# Patient Record
Sex: Male | Born: 1945
Health system: Southern US, Community
[De-identification: ages and names within clinical notes are randomized; demographics above are authoritative.]

## PROBLEM LIST (undated history)

## (undated) DIAGNOSIS — G4730 Sleep apnea, unspecified: Secondary | ICD-10-CM

## (undated) DIAGNOSIS — I739 Peripheral vascular disease, unspecified: Secondary | ICD-10-CM

## (undated) DIAGNOSIS — I701 Atherosclerosis of renal artery: Secondary | ICD-10-CM

## (undated) DIAGNOSIS — J189 Pneumonia, unspecified organism: Secondary | ICD-10-CM

## (undated) DIAGNOSIS — Z95820 Peripheral vascular angioplasty status with implants and grafts: Secondary | ICD-10-CM

## (undated) DIAGNOSIS — I5189 Other ill-defined heart diseases: Secondary | ICD-10-CM

## (undated) DIAGNOSIS — I214 Non-ST elevation (NSTEMI) myocardial infarction: Secondary | ICD-10-CM

## (undated) DIAGNOSIS — I10 Essential (primary) hypertension: Secondary | ICD-10-CM

## (undated) DIAGNOSIS — I2510 Atherosclerotic heart disease of native coronary artery without angina pectoris: Secondary | ICD-10-CM

## (undated) DIAGNOSIS — K449 Diaphragmatic hernia without obstruction or gangrene: Secondary | ICD-10-CM

## (undated) DIAGNOSIS — E785 Hyperlipidemia, unspecified: Secondary | ICD-10-CM

## (undated) DIAGNOSIS — I519 Heart disease, unspecified: Secondary | ICD-10-CM

## (undated) HISTORY — PX: HIATAL HERNIA REPAIR: SHX195

## (undated) HISTORY — DX: Diaphragmatic hernia without obstruction or gangrene: K44.9

## (undated) HISTORY — DX: Atherosclerotic heart disease of native coronary artery without angina pectoris: I25.10

## (undated) HISTORY — DX: Hyperlipidemia, unspecified: E78.5

## (undated) HISTORY — PX: OTHER SURGICAL HISTORY: SHX169

## (undated) HISTORY — PX: CATARACT EXTRACTION: SUR2

## (undated) HISTORY — DX: Peripheral vascular disease, unspecified: I73.9

## (undated) HISTORY — DX: Atherosclerosis of renal artery: I70.1

## (undated) HISTORY — DX: Sleep apnea, unspecified: G47.30

## (undated) HISTORY — PX: SOFT TISSUE TUMOR RESECTION: SHX1054

## (undated) HISTORY — PX: SHOULDER ARTHROSCOPY: SHX128

---

## 1999-02-28 ENCOUNTER — Inpatient Hospital Stay (HOSPITAL_COMMUNITY): Admission: EM | Admit: 1999-02-28 | Discharge: 1999-03-02 | Payer: Self-pay | Admitting: Emergency Medicine

## 1999-02-28 ENCOUNTER — Encounter: Payer: Self-pay | Admitting: Internal Medicine

## 1999-09-27 ENCOUNTER — Encounter: Payer: Self-pay | Admitting: Emergency Medicine

## 1999-09-27 ENCOUNTER — Emergency Department (HOSPITAL_COMMUNITY): Admission: EM | Admit: 1999-09-27 | Discharge: 1999-09-27 | Payer: Self-pay

## 1999-09-28 ENCOUNTER — Encounter: Payer: Self-pay | Admitting: Urology

## 1999-09-28 ENCOUNTER — Ambulatory Visit (HOSPITAL_COMMUNITY): Admission: RE | Admit: 1999-09-28 | Discharge: 1999-09-28 | Payer: Self-pay | Admitting: Urology

## 2002-12-15 ENCOUNTER — Emergency Department (HOSPITAL_COMMUNITY): Admission: EM | Admit: 2002-12-15 | Discharge: 2002-12-15 | Payer: Self-pay | Admitting: Emergency Medicine

## 2002-12-15 ENCOUNTER — Encounter: Payer: Self-pay | Admitting: Emergency Medicine

## 2003-06-10 ENCOUNTER — Ambulatory Visit (HOSPITAL_COMMUNITY): Admission: RE | Admit: 2003-06-10 | Discharge: 2003-06-10 | Payer: Self-pay | Admitting: Unknown Physician Specialty

## 2004-08-04 ENCOUNTER — Ambulatory Visit: Payer: Self-pay | Admitting: Internal Medicine

## 2004-08-04 ENCOUNTER — Inpatient Hospital Stay (HOSPITAL_COMMUNITY): Admission: AD | Admit: 2004-08-04 | Discharge: 2004-08-08 | Payer: Self-pay | Admitting: Internal Medicine

## 2004-08-04 ENCOUNTER — Ambulatory Visit: Payer: Self-pay | Admitting: Cardiology

## 2004-08-17 ENCOUNTER — Ambulatory Visit: Payer: Self-pay | Admitting: Family Medicine

## 2004-08-21 ENCOUNTER — Ambulatory Visit: Payer: Self-pay | Admitting: Internal Medicine

## 2004-08-22 ENCOUNTER — Ambulatory Visit: Payer: Self-pay | Admitting: Family Medicine

## 2004-08-29 ENCOUNTER — Ambulatory Visit: Payer: Self-pay

## 2004-08-29 ENCOUNTER — Encounter: Payer: Self-pay | Admitting: Cardiology

## 2004-09-06 ENCOUNTER — Ambulatory Visit: Payer: Self-pay | Admitting: Cardiology

## 2004-09-12 ENCOUNTER — Ambulatory Visit: Payer: Self-pay | Admitting: Cardiology

## 2004-09-12 ENCOUNTER — Ambulatory Visit: Payer: Self-pay

## 2004-09-12 ENCOUNTER — Encounter: Payer: Self-pay | Admitting: Cardiology

## 2004-09-18 ENCOUNTER — Ambulatory Visit: Payer: Self-pay | Admitting: Internal Medicine

## 2004-09-21 ENCOUNTER — Ambulatory Visit: Payer: Self-pay | Admitting: Cardiology

## 2004-10-05 ENCOUNTER — Ambulatory Visit: Payer: Self-pay | Admitting: Cardiology

## 2004-10-24 ENCOUNTER — Ambulatory Visit: Payer: Self-pay | Admitting: Family Medicine

## 2004-11-09 ENCOUNTER — Ambulatory Visit: Payer: Self-pay | Admitting: Internal Medicine

## 2004-11-24 ENCOUNTER — Ambulatory Visit: Payer: Self-pay | Admitting: Family Medicine

## 2004-11-28 ENCOUNTER — Ambulatory Visit: Payer: Self-pay

## 2004-11-28 ENCOUNTER — Ambulatory Visit: Payer: Self-pay | Admitting: Internal Medicine

## 2004-11-28 ENCOUNTER — Encounter: Payer: Self-pay | Admitting: Cardiology

## 2004-12-04 ENCOUNTER — Ambulatory Visit: Payer: Self-pay | Admitting: Internal Medicine

## 2004-12-07 ENCOUNTER — Ambulatory Visit: Payer: Self-pay | Admitting: Physician Assistant

## 2005-01-04 ENCOUNTER — Ambulatory Visit: Payer: Self-pay | Admitting: Cardiology

## 2005-01-15 ENCOUNTER — Ambulatory Visit: Payer: Self-pay | Admitting: Cardiology

## 2005-01-15 ENCOUNTER — Encounter: Payer: Self-pay | Admitting: Cardiology

## 2005-01-15 ENCOUNTER — Ambulatory Visit: Payer: Self-pay

## 2005-01-17 ENCOUNTER — Ambulatory Visit: Payer: Self-pay | Admitting: Cardiology

## 2005-01-17 ENCOUNTER — Ambulatory Visit (HOSPITAL_COMMUNITY): Admission: RE | Admit: 2005-01-17 | Discharge: 2005-01-17 | Payer: Self-pay | Admitting: Cardiology

## 2005-01-18 ENCOUNTER — Ambulatory Visit: Payer: Self-pay | Admitting: Family Medicine

## 2005-02-05 ENCOUNTER — Ambulatory Visit: Payer: Self-pay | Admitting: Internal Medicine

## 2005-02-23 ENCOUNTER — Ambulatory Visit: Payer: Self-pay | Admitting: Cardiology

## 2005-02-27 ENCOUNTER — Ambulatory Visit: Payer: Self-pay | Admitting: Pulmonary Disease

## 2005-03-04 ENCOUNTER — Ambulatory Visit (HOSPITAL_BASED_OUTPATIENT_CLINIC_OR_DEPARTMENT_OTHER): Admission: RE | Admit: 2005-03-04 | Discharge: 2005-03-04 | Payer: Self-pay | Admitting: Pulmonary Disease

## 2005-03-08 ENCOUNTER — Ambulatory Visit: Payer: Self-pay | Admitting: Cardiology

## 2005-04-26 ENCOUNTER — Ambulatory Visit: Payer: Self-pay | Admitting: Cardiology

## 2005-05-25 ENCOUNTER — Ambulatory Visit: Payer: Self-pay | Admitting: Cardiology

## 2005-07-12 ENCOUNTER — Ambulatory Visit: Payer: Self-pay | Admitting: Family Medicine

## 2005-08-20 ENCOUNTER — Ambulatory Visit: Payer: Self-pay | Admitting: Family Medicine

## 2005-10-17 ENCOUNTER — Ambulatory Visit: Payer: Self-pay | Admitting: Family Medicine

## 2005-12-12 ENCOUNTER — Ambulatory Visit: Payer: Self-pay | Admitting: Family Medicine

## 2006-04-17 ENCOUNTER — Ambulatory Visit: Payer: Self-pay | Admitting: Cardiology

## 2007-03-19 ENCOUNTER — Ambulatory Visit: Payer: Self-pay | Admitting: Cardiology

## 2007-12-10 ENCOUNTER — Ambulatory Visit: Payer: Self-pay | Admitting: Vascular Surgery

## 2010-04-25 DIAGNOSIS — G4730 Sleep apnea, unspecified: Secondary | ICD-10-CM | POA: Insufficient documentation

## 2010-04-25 DIAGNOSIS — I701 Atherosclerosis of renal artery: Secondary | ICD-10-CM

## 2010-04-25 DIAGNOSIS — E782 Mixed hyperlipidemia: Secondary | ICD-10-CM

## 2010-04-25 DIAGNOSIS — E119 Type 2 diabetes mellitus without complications: Secondary | ICD-10-CM | POA: Insufficient documentation

## 2010-04-25 DIAGNOSIS — I2510 Atherosclerotic heart disease of native coronary artery without angina pectoris: Secondary | ICD-10-CM | POA: Insufficient documentation

## 2010-04-26 ENCOUNTER — Ambulatory Visit: Payer: Self-pay | Admitting: Cardiology

## 2010-04-26 DIAGNOSIS — I739 Peripheral vascular disease, unspecified: Secondary | ICD-10-CM

## 2010-05-09 ENCOUNTER — Telehealth (INDEPENDENT_AMBULATORY_CARE_PROVIDER_SITE_OTHER): Payer: Self-pay | Admitting: *Deleted

## 2010-05-10 ENCOUNTER — Ambulatory Visit: Payer: Self-pay

## 2010-05-10 ENCOUNTER — Ambulatory Visit: Payer: Self-pay | Admitting: Internal Medicine

## 2010-05-10 ENCOUNTER — Encounter: Payer: Self-pay | Admitting: Internal Medicine

## 2010-05-10 ENCOUNTER — Encounter: Payer: Self-pay | Admitting: Cardiology

## 2010-05-10 ENCOUNTER — Encounter (HOSPITAL_COMMUNITY)
Admission: RE | Admit: 2010-05-10 | Discharge: 2010-07-18 | Payer: Self-pay | Source: Home / Self Care | Attending: Cardiology | Admitting: Cardiology

## 2010-05-23 ENCOUNTER — Encounter: Payer: Self-pay | Admitting: Cardiology

## 2010-05-24 ENCOUNTER — Ambulatory Visit: Payer: Self-pay | Admitting: Cardiology

## 2010-05-29 ENCOUNTER — Encounter: Payer: Self-pay | Admitting: Cardiology

## 2010-05-30 ENCOUNTER — Encounter: Payer: Self-pay | Admitting: Cardiology

## 2010-05-30 LAB — CONVERTED CEMR LAB
Eosinophils Absolute: 0.1 10*3/uL (ref 0.0–0.7)
Eosinophils Relative: 1 % (ref 0–5)
HCT: 45 % (ref 39.0–52.0)
INR: 0.99 (ref <1.50)
Lymphocytes Relative: 26 % (ref 12–46)
Lymphs Abs: 2.1 10*3/uL (ref 0.7–4.0)
MCV: 83 fL (ref 78.0–100.0)
Monocytes Relative: 6 % (ref 3–12)
Potassium: 4 meq/L (ref 3.5–5.3)
RBC: 5.42 M/uL (ref 4.22–5.81)
Sodium: 138 meq/L (ref 135–145)
WBC: 8 10*3/uL (ref 4.0–10.5)

## 2010-06-01 ENCOUNTER — Ambulatory Visit
Admission: RE | Admit: 2010-06-01 | Payer: Self-pay | Source: Home / Self Care | Attending: Cardiology | Admitting: Cardiology

## 2010-06-05 ENCOUNTER — Encounter: Payer: Self-pay | Admitting: Cardiology

## 2010-06-05 ENCOUNTER — Inpatient Hospital Stay (HOSPITAL_COMMUNITY)
Admission: RE | Admit: 2010-06-05 | Discharge: 2010-06-07 | Payer: Self-pay | Source: Home / Self Care | Attending: Cardiology | Admitting: Cardiology

## 2010-06-05 LAB — CONVERTED CEMR LAB
Cholesterol: 132 mg/dL (ref 0–200)
HDL: 24 mg/dL — ABNORMAL LOW (ref >39)
Triglycerides: 790 mg/dL — ABNORMAL HIGH (ref <150)

## 2010-06-12 ENCOUNTER — Observation Stay (HOSPITAL_COMMUNITY)
Admission: EM | Admit: 2010-06-12 | Discharge: 2010-06-12 | Payer: Self-pay | Source: Home / Self Care | Admitting: Emergency Medicine

## 2010-07-03 ENCOUNTER — Encounter: Payer: Self-pay | Admitting: Cardiology

## 2010-07-05 ENCOUNTER — Ambulatory Visit
Admission: RE | Admit: 2010-07-05 | Discharge: 2010-07-05 | Payer: Self-pay | Source: Home / Self Care | Attending: Cardiology | Admitting: Cardiology

## 2010-07-05 ENCOUNTER — Encounter: Payer: Self-pay | Admitting: Cardiology

## 2010-07-20 NOTE — Miscellaneous (Signed)
  Clinical Lists Changes  Observations: Added new observation of CARDCATHFIND: CONCLUSION:  Successful percutaneous overlapping stents in the distal right coronary artery.   DISPOSITION:  Aggressive risk factor reduction will be recommended. Continuation of clopidogrel will be also suggested, P2Y12 testing will be done.  He has been comfortable on Plavix and if there is good suppression, we will continue this if this is suboptimal changing to Effient may be worthwhile. (06/06/2010 14:57) Added new observation of CARDCATHFIND: CONCLUSION:  Nonobstructive three-vessel coronary artery disease with patent stents in the right coronary artery and LAD.  However, there is a distal tight right coronary artery lesion that may be causing his symptoms.   PLAN:  The patient will be started on Imdur.  He is going to get sublingual nitroglycerin.  He has had a stable pattern of angina, but he has resting discomfort.  I will bring him back for an elective PCI with Dr. Riley Kill.  He understands the need to present to the emergency room should he have any increasing or unstable symptoms.   (06/02/2010 14:57)      Cardiac Cath  Procedure date:  06/06/2010  Findings:      CONCLUSION:  Successful percutaneous overlapping stents in the distal right coronary artery.   DISPOSITION:  Aggressive risk factor reduction will be recommended. Continuation of clopidogrel will be also suggested, P2Y12 testing will be done.  He has been comfortable on Plavix and if there is good suppression, we will continue this if this is suboptimal changing to Effient may be worthwhile.  Cardiac Cath  Procedure date:  06/02/2010  Findings:      CONCLUSION:  Nonobstructive three-vessel coronary artery disease with patent stents in the right coronary artery and LAD.  However, there is a distal tight right coronary artery lesion that may be causing his symptoms.   PLAN:  The patient will be started on Imdur.  He is  going to get sublingual nitroglycerin.  He has had a stable pattern of angina, but he has resting discomfort.  I will bring him back for an elective PCI with Dr. Riley Kill.  He understands the need to present to the emergency room should he have any increasing or unstable symptoms.

## 2010-07-20 NOTE — Miscellaneous (Signed)
  Clinical Lists Changes  Observations: Added new observation of ANKLBRACHIND: Essentially stable bilateral ABI's. Bilateral ABI's are in the mild range. (05/10/2010 9:28) Added new observation of NUCLEAR NOS: Exercise Capacity: Lexiscan with low level exercise Clinical Symptoms: Chest tightness ECG Impression: Markd ST depression Overall Impression: Abnormal stress nuclear study. Overall Impression Comments: There are marked ECG changes with low-level exercise and Lexiscan however perfusion imaging is normal.  (05/10/2010 9:27)      Nuclear Study  Procedure date:  05/10/2010  Findings:      Exercise Capacity: Lexiscan with low level exercise Clinical Symptoms: Chest tightness ECG Impression: Markd ST depression Overall Impression: Abnormal stress nuclear study. Overall Impression Comments: There are marked ECG changes with low-level exercise and Lexiscan however perfusion imaging is normal.   ABI's  Procedure date:  05/10/2010  Findings:      Essentially stable bilateral ABI's. Bilateral ABI's are in the mild range.

## 2010-07-20 NOTE — Assessment & Plan Note (Signed)
Summary: [facility]  Medications Added EFFIENT 10 MG TABS (PRASUGREL HCL) 1 by mouth daily ISOSORBIDE MONONITRATE CR 60 MG XR24H-TAB (ISOSORBIDE MONONITRATE) 1 by mouth daily DEXILANT 60 MG CPDR (DEXLANSOPRAZOLE) daily      Allergies Added:    Visit Type:  Follow-up Primary Provider:  Dr. Ardeen Garland  CC:  CAD.  History of Present Illness: The patient presents for followup of recent PCI of his right coronary artery. At that time he was noted to have a suboptimal platelet response and Plavix. He was sent home on Effient.  He also had Dexilant switched to Prilosec.  Since his discharge he has had none of the chest discomfort that he was having previously. He is able to be active with a greater exercise tolerance. He said no leg pain. He denies any new shortness of breath, PND or orthopnea. He has had no weight gain or edema. His biggest complaint has been continued diarrhea or loose stools. Of note one day when he forgot to take his medications he noted on the following day no diarrhea.  Current Medications (verified): 1)  Fish Oil   Oil (Fish Oil) .Marland Kitchen.. 1 By Mouth Daily 2)  Glipizide 5 Mg Tabs (Glipizide) .Marland Kitchen.. 1 By Mouth Daily 3)  Tricor 145 Mg Tabs (Fenofibrate) .Marland Kitchen.. 1 By Mouth Daily 4)  Metformin Hcl 500 Mg Tabs (Metformin Hcl) .... 2 By Mouth Two Times A Day 5)  Metoprolol Succinate 200 Mg Xr24h-Tab (Metoprolol Succinate) .... 1/2 By Mouth Two Times A Day 6)  Effient 10 Mg Tabs (Prasugrel Hcl) .Marland Kitchen.. 1 By Mouth Daily 7)  Lipitor 80 Mg Tabs (Atorvastatin Calcium) .Marland Kitchen.. 1 By Mouth Daily 8)  Aspirin 81 Mg  Tabs (Aspirin) .Marland Kitchen.. 1 By Mouth Daily 9)  Allopurinol 100 Mg Tabs (Allopurinol) .Marland Kitchen.. 1 By Mouth Daily 10)  Amlodipine Besylate 10 Mg Tabs (Amlodipine Besylate) .Marland Kitchen.. 1 By Mouth Daily 11)  Ramipril 10 Mg Caps (Ramipril) .Marland Kitchen.. 1 By Mouth Daily 12)  Protonix 40 Mg Tbec (Pantoprazole Sodium) .Marland Kitchen.. 1 By Mouth Daily 13)  Isosorbide Mononitrate Cr 60 Mg Xr24h-Tab (Isosorbide Mononitrate)  .Marland Kitchen.. 1 By Mouth Daily  Allergies (verified): 1)  ! Penicillin 2)  ! * Bee Stings  Past History:  Past Medical History: Coronary artery disease (catheterization 2000   revealed nonobstructive disease.  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  12/11 1. Percutaneous stenting    of the distal right coronary artery with drug-eluting stent. Percutaneous stenting    of the distal right coronary proximal posterior descending artery with a drug-eluting stent.) Peripheral vascular disease (He has ABIs of 0.69 on the right and 0.93 on the left.   Vocal cord tumor resected, abdominal  Hyperlipidemia Diabetes mellitus Hiatal hernia  Miild sleep apnea Mild bilateral renal artery stenosis.   Past Surgical History: Hiatal hernia surgery Benign neck tumor  resection Right shoulder arthroscopic surgery Ctaract surgery  Vocal cord polyp removal  Social History: The patient lives in [place] with his wife.  They have  three children.  He works for [employer].  He quit tobacco 7 years ago  Review of Systems       As stated in the HPI and negative for all other systems.   Vital Signs:  Patient profile:   65 year  old male Height:      69 inches Weight:      197 pounds BMI:     29.20 Pulse rate:   102 / minute Resp:     16 per minute BP sitting:   142 / 80  (right arm)  Vitals Entered By: Marrion Coy, CNA (July 05, 2010 1:59 PM)  Physical Exam  General:  Well developed, well nourished, in no acute distress. Head:  normocephalic and atraumatic Eyes:  PERRLA/EOM intact; conjunctiva and lids normal. Mouth:  Oral mucosa normal. Neck:  Neck supple, no JVD. No masses, thyromegaly or abnormal cervical nodes. Chest Wall:  no deformities  Lungs:  Clear bilaterally to auscultation and  percussion. Abdomen:  Bowel sounds positive; abdomen soft and non-tender without masses, organomegaly, or hernias noted. No hepatosplenomegaly. Msk:  Back normal, normal gait. Muscle strength and tone normal. Extremities:  No clubbing or cyanosi,  , mild swelling and erythema of the second right toe improved. Neurologic:  Alert and oriented x 3. Skin:  Intact without lesions or rashes. Cervical Nodes:  no significant adenopathy Inguinal Nodes:  no significant adenopathy Psych:  Normal affect.   Detailed Cardiovascular Exam  Neck    Carotids: Carotids full and equal bilaterally without bruits.      Neck Veins: Normal, no JVD.    Heart    Inspection: no deformities or lifts noted.      Palpation: normal PMI with no thrills palpable.      Auscultation: regular rate and rhythm, S1, S2 without murmurs, rubs, gallops, or clicks.    Vascular    Abdominal Aorta: no palpable masses, pulsations, or audible bruits.      Pedal Pulses: absent right dorsalis pedis pulse, absent right posterior tibial pulse, and absent right dorsalis pedis pulse, absent right posterior tibial pulse, and absent left dorsalis pedis pulse.      Radial Pulses: normal radial pulses bilaterally.      Peripheral Circulation: no clubbing, cyanosis, or edema noted with normal capillary refill.     EKG  Procedure date:  07/05/2010  Findings:      Sinus tachycardia, rate 102, axis within normal limits, intervals within normal limits, inferolateral ST depression  Impression & Recommendations:  Problem # 1:  CAD (ICD-414.00) The patient is now status post DES to his RCA. He is complaining of diarrhea which could be related to Effient.  However, I very much want to continue this drug.  I will try first to switch him back to Dexilant and stop the Prilosec to see if this helps the diarrhea.  If not I might request GI evaluation and stool samples for C Diff.  I do think that his heart rate is increased with the fluid loss and  he is instructed to increase by mouth fluid intake.  His ST changes on EKG seem to be rate related.  Of note his BMET was checked last week. Orders: EKG w/ Interpretation (93000)  Problem # 2:  PVD (ICD-443.9) He will continue with risk reduction.  Problem # 3:  HYPERLIPIDEMIA (ICD-272.4) I will follow up on this on the months to come.  Patient Instructions: 1)  Your physician recommends that you schedule a follow-up appointment in: 1 month with Dr Antoine Poche. 2)  Your physician has recommended you make the following change in your medication:  start Dexilant, stop Protonix Prescriptions: DEXILANT 60 MG CPDR (DEXLANSOPRAZOLE) daily  #90 x 3   Entered by:   Charolotte Capuchin, RN   Authorized by:  Rollene Rotunda, MD, [facility]   Signed by:   Charolotte Capuchin, RN on 07/05/2010   Method used:   Print then Give to Patient   RxID:   [id number] ISOSORBIDE MONONITRATE CR 60 MG XR24H-TAB (ISOSORBIDE MONONITRATE) 1 by mouth daily  #90 x 3   Entered by:   Charolotte Capuchin, RN   Authorized by:   Rollene Rotunda, MD, [facility]   Signed by:   Charolotte Capuchin, RN on 07/05/2010   Method used:   Print then Give to Patient   RxID:   [id number] EFFIENT 10 MG TABS (PRASUGREL HCL) 1 by mouth daily  #90 x 3   Entered by:   Charolotte Capuchin, RN   Authorized by:   Rollene Rotunda, MD, [facility]   Signed by:   Charolotte Capuchin, RN on 07/05/2010   Method used:   Print then Give to Patient   RxID:   [id number]

## 2010-07-20 NOTE — Letter (Signed)
Summary: Cardiac Catheterization Instructions- [facility]  [facility]. [address]   [place], [place] [address]   Phone: [phone]  Fax: [phone]     05/24/2010 MRN: [id number]  [name] [name] [address] [place], [place]  [address]  Dear [name],   You are scheduled for a Cardiac Catheterization on Thursday June 01, 2010 with Dr.  Rollene Rotunda  Please arrive to the 1st floor of the Heart and Vascular Center at [facility] at 8:30 am  on the day of your procedure. Please do not arrive before 6:30 a.m. Call the Heart and Vascular Center at [phone] if you are unable to make your appointmnet. The Code to get into the parking garage under the building is 0003. Take the elevators to the 1st floor. You must have someone to drive you home. Someone must be with you for the first 24 hours after you arrive home. Please wear clothes that are easy to get on and off and wear slip-on shoes. Do not eat or drink after midnight except water with your medications that morning. Bring all your medications and current insurance cards with you.  ___ DO NOT take these medications before your procedure: ________________________________________________________________  ___ Make sure you take your aspirin.  ___ You may take ALL of your medications with water that morning.       The usual length of stay after your procedure is 2 to 3 hours. This can vary.  If you have any questions, please call the office at the number listed above.   Charolotte Capuchin, RN  Appended Document: Cardiac Catheterization Instructions- [facility] pt aware not to take Metformin the day before or 2 days after cath.  Holding Glipizide the am of

## 2010-07-20 NOTE — Assessment & Plan Note (Signed)
Summary: [facility]  Medications Added TRILIPIX 135 MG CPDR (CHOLINE FENOFIBRATE) 1 by mouth daily FISH OIL   OIL (FISH OIL) 1 by mouth daily GLIPIZIDE 5 MG TABS (GLIPIZIDE) 1 by mouth daily TRICOR 145 MG TABS (FENOFIBRATE) 1 by mouth daily DEXILANT 60 MG CPDR (DEXLANSOPRAZOLE) 1 by mouth daily METFORMIN HCL 500 MG TABS (METFORMIN HCL) 2 by mouth two times a day METOPROLOL SUCCINATE 200 MG XR24H-TAB (METOPROLOL SUCCINATE) 1/2 by mouth two times a day PLAVIX 75 MG TABS (CLOPIDOGREL BISULFATE) 1 by mouth daily LIPITOR 80 MG TABS (ATORVASTATIN CALCIUM) 1 by mouth daily ASPIRIN 81 MG  TABS (ASPIRIN) 1 by mouth daily ALLOPURINOL 100 MG TABS (ALLOPURINOL) 1 by mouth daily AMLODIPINE BESYLATE 10 MG TABS (AMLODIPINE BESYLATE) 1 by mouth daily RAMIPRIL 10 MG CAPS (RAMIPRIL) 1 by mouth daily      Allergies Added: ! PENICILLIN ! * BEE STINGS  Visit Type:  Initial Consult Primary Provider:  Dr. Ardeen Garland  CC:  CAD and PVD.  History of Present Illness: The patient presents for followup after having been gone from this clinic for greater than 3 years. He has had coronary disease as described below and peripheral vascular disease. He had conservative management of his PVD by his choice.  He has had some progressive pain with walking since that last visit. This may happen after walking a quarter of a mile. He also has had some redness and swelling in his second toe on the right foot since dropping something on it several weeks ago. There is no open sore but the swelling and redness has not resolved. It is only mildly uncomfortable. He has had some left arm pain which was his previous angina. However, he can rigidity or adenopathy this discomfort. The next day he notices some discomfort. He is not having shortness of breath, PND orthopnea. There is no neck or chest pressure. He has had no palpitations, presyncope or syncope.  Current Medications (verified): 1)  Trilipix 135 Mg Cpdr (Choline  Fenofibrate) .Marland Kitchen.. 1 By Mouth Daily 2)  Fish Oil   Oil (Fish Oil) .Marland Kitchen.. 1 By Mouth Daily 3)  Glipizide 5 Mg Tabs (Glipizide) .Marland Kitchen.. 1 By Mouth Daily 4)  Tricor 145 Mg Tabs (Fenofibrate) .Marland Kitchen.. 1 By Mouth Daily 5)  Dexilant 60 Mg Cpdr (Dexlansoprazole) .Marland Kitchen.. 1 By Mouth Daily 6)  Metformin Hcl 500 Mg Tabs (Metformin Hcl) .... 2 By Mouth Two Times A Day 7)  Metoprolol Succinate 200 Mg Xr24h-Tab (Metoprolol Succinate) .... 1/2 By Mouth Two Times A Day 8)  Plavix 75 Mg Tabs (Clopidogrel Bisulfate) .Marland Kitchen.. 1 By Mouth Daily 9)  Lipitor 80 Mg Tabs (Atorvastatin Calcium) .Marland Kitchen.. 1 By Mouth Daily 10)  Aspirin 81 Mg  Tabs (Aspirin) .Marland Kitchen.. 1 By Mouth Daily 11)  Allopurinol 100 Mg Tabs (Allopurinol) .Marland Kitchen.. 1 By Mouth Daily 12)  Amlodipine Besylate 10 Mg Tabs (Amlodipine Besylate) .Marland Kitchen.. 1 By Mouth Daily 13)  Ramipril 10 Mg Caps (Ramipril) .Marland Kitchen.. 1 By Mouth Daily  Allergies (verified): 1)  ! Penicillin 2)  ! * Bee Stings  Past History:  Past Medical History:  Coronary artery disease (catheterization 2000   revealed nonobstructive disease.  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    He has evidence for common femoral and superficial artery stenosis on the   right and SFA disease on the left), vocal cord tumor resected, abdominal   hernia repaired, hyperlipidemia, diabetes mellitus, hiatal hernia, mild   sleep apnea, mild bilateral renal artery stenosis.   Past Surgical History: Hiatal hernia surgery, benign neck tumor  resection, status post right shoulder arthroscopic surgery, cataract surgery vocal cord polyp removal  Family History: Father with an MI age 9  Social History: The patient lives in [place] with  his wife.  They have  three children.  He works for [employer].  He quit tobacco 7 yearstago  Review of Systems       Positive for reflux, diarrhea  Vital Signs:  Patient profile:   65 year old male Height:      69 inches Weight:      198 pounds BMI:     29.35 Pulse rate:   114 / minute Resp:     18 per minute BP sitting:   132 / 80  (right arm)  Vitals Entered By: Marrion Coy, CNA (April 26, 2010 11:59 AM)  Physical Exam  General:  Well developed, well nourished, in no acute distress. Head:  normocephalic and atraumatic Eyes:  PERRLA/EOM intact; conjunctiva and lids normal. Mouth:  Oral mucosa normal. Neck:  Neck supple, no JVD. No masses, thyromegaly or abnormal cervical nodes. Chest Wall:  no deformities or breast masses noted Lungs:  Clear bilaterally to auscultation and percussion. Abdomen:  Bowel sounds positive; abdomen soft and non-tender without masses, organomegaly, or hernias noted. No hepatosplenomegaly. Msk:  Back normal, normal gait. Muscle strength and tone normal. Extremities:  No clubbing or cyanosi,  , mild swelling and erythema of the second right toe. Neurologic:  Alert and oriented x 3. Skin:  Intact without lesions or rashes. Cervical Nodes:  no significant adenopathy Axillary Nodes:  no significant adenopathy Inguinal Nodes:  no significant adenopathy Psych:  Normal affect.   New Orders:     1)  EKG w/ Interpretation (93000)     2)  Arterial Duplex Lower Extremity (Arterial Duplex Low)     3)  Nuclear Stress Test (Nuc Stress Test)   Detailed Cardiovascular Exam  Neck    Carotids: Carotids full and equal bilaterally without bruits.      Neck Veins: Normal, no JVD.    Heart    Inspection: no deformities or lifts noted.      Palpation: normal PMI with no thrills palpable.      Auscultation: regular rate and rhythm, S1, S2 without murmurs, rubs, gallops, or clicks.    Vascular    Abdominal Aorta: no palpable masses, pulsations, or  audible bruits.      Pedal Pulses: absent right dorsalis pedis pulse, absent right posterior tibial pulse, and absent right dorsalis pedis pulse, absent right posterior tibial pulse, and absent left dorsalis pedis pulse.      Radial Pulses: normal radial pulses bilaterally.      Peripheral Circulation: no clubbing, cyanosis, or edema noted with normal capillary refill.     EKG  Procedure date:  04/26/2010  Findings:      Sinus tachycardia, rate 180, axis within normal limits tongue intervals within normal limits, lateral T-wave inversion with bowel EKGs for comparison  Impression & Recommendations:  Problem # 1:  CAD (ICD-414.00) He has some recurrent arm pain that is somewhat atypical. At this point  I would like to evaluate with a stress perfusion study. If he cannot walk this it should be converted to a pharmacologic perfusion study. Orders: EKG w/ Interpretation (93000) Nuclear Stress Test (Nuc Stress Test)  Problem # 2:  PVD (ICD-443.9) I will order ABIs and he will likely need referral for peripheral vascular consultation.  Problem # 3:  HYPERLIPIDEMIA (ICD-272.4) He is on combination therapy. I will defer to his primary physician with a goal LDL less than 70 and HDL greater than 50.  Problem # 4:  RENAL ARTERY STENOSIS (ICD-440.1) I will obtain any recent labs done by his primary physician to look at his creatinine.  Other Orders: Arterial Duplex Lower Extremity (Arterial Duplex Low)  Patient Instructions: 1)  Your physician recommends that you schedule a follow-up appointment in: 1 MONTH WITH DR [facility] 2)  Your physician recommends that you continue on your current medications as directed. Please refer to the Current Medication list given to you today. 3)  Your physician has requested that you have an ankle brachial index (ABI). During this test an ultrasound and blood pressure cuff are used to evaluate the arteries that supply the arms and legs with blood. Allow thirty  minutes for this exam. There are no restrictions or special instructions. 4)  Your physician has requested that you have an exercise stress myoview.  For further information please visit [email or web address]  Please follow instruction sheet, as given.

## 2010-07-20 NOTE — Progress Notes (Signed)
Summary: Nuclear pre procedure  Phone Note Outgoing Call Call back at [facility] Phone [phone]   Call placed by: Rea College, CMA,  May 09, 2010 4:05 PM Call placed to: Patient Summary of Call: Reviewed information on Myoview Information Sheet (see scanned document for further details).  Spoke with patient.      Nuclear Med Background Indications for Stress Test: Evaluation for Ischemia, Stent Patency   History: COPD, Heart Catheterization, Myocardial Infarction, Stents  History Comments: 2/06 Stent-LAD/RCA; 3/06 QIO:NGEXBM, EF=55%  Symptoms: Chest Pain    Nuclear Pre-Procedure Cardiac Risk Factors: Claudication, Family History - CAD, History of Smoking, Lipids, NIDDM, PVD Height (in): 69

## 2010-07-20 NOTE — Assessment & Plan Note (Signed)
Summary: [facility]      Allergies Added:   Visit Type:  Follow-up Primary Provider:  Dr. Ardeen Garland  CC:  CAD and Arm Pain.  History of Present Illness: The patient presents for followup of arm pain. Since I last saw him he has had progression of his bilateral arm discomfort.  This is similar to his previous angina.  It can be severe.  It comes on at rest and goes away spontaneously.  He does not describe associated chest or jaw discomfort but he never had this as an anginal. He does not describe associated nausea vomiting or diaphoresis. I did send him for a stress perfusion study. This was pharmacologic. He developed diffuse T-wave changes as well as chest discomfort though the images did not demonstrate ischemia. He also has had bilateral leg pain with walking. His ABIs were stable with 0.74 on the right and 0.87 on the left.  Current Medications (verified): 1)  Trilipix 135 Mg Cpdr (Choline Fenofibrate) .Marland Kitchen.. 1 By Mouth Daily 2)  Fish Oil   Oil (Fish Oil) .Marland Kitchen.. 1 By Mouth Daily 3)  Glipizide 5 Mg Tabs (Glipizide) .Marland Kitchen.. 1 By Mouth Daily 4)  Tricor 145 Mg Tabs (Fenofibrate) .Marland Kitchen.. 1 By Mouth Daily 5)  Dexilant 60 Mg Cpdr (Dexlansoprazole) .Marland Kitchen.. 1 By Mouth Daily 6)  Metformin Hcl 500 Mg Tabs (Metformin Hcl) .... 2 By Mouth Two Times A Day 7)  Metoprolol Succinate 200 Mg Xr24h-Tab (Metoprolol Succinate) .... 1/2 By Mouth Two Times A Day 8)  Plavix 75 Mg Tabs (Clopidogrel Bisulfate) .Marland Kitchen.. 1 By Mouth Daily 9)  Lipitor 80 Mg Tabs (Atorvastatin Calcium) .Marland Kitchen.. 1 By Mouth Daily 10)  Aspirin 81 Mg  Tabs (Aspirin) .Marland Kitchen.. 1 By Mouth Daily 11)  Allopurinol 100 Mg Tabs (Allopurinol) .Marland Kitchen.. 1 By Mouth Daily 12)  Amlodipine Besylate 10 Mg Tabs (Amlodipine Besylate) .Marland Kitchen.. 1 By Mouth Daily 13)  Ramipril 10 Mg Caps (Ramipril) .Marland Kitchen.. 1 By Mouth Daily  Allergies (verified): 1)  ! Penicillin 2)  ! * Bee Stings  Past History:  Past Medical History: Reviewed history from 04/26/2010 and no changes  required.  Coronary artery disease (catheterization 2000   revealed nonobstructive disease.  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    He has evidence for common femoral and superficial artery stenosis on the   right and SFA disease on the left), vocal cord tumor resected, abdominal   hernia repaired, hyperlipidemia, diabetes mellitus, hiatal hernia, mild   sleep apnea, mild bilateral renal artery stenosis.   Past Surgical History: Reviewed history from 04/26/2010 and no changes required. Hiatal hernia surgery, benign neck tumor  resection, status post right shoulder arthroscopic surgery, cataract surgery vocal cord polyp removal  Review of Systems       As stated in the HPI and negative for all other systems.   Vital Signs:  Patient profile:   65 year old male Height:      69 inches Weight:      201 pounds BMI:     29.79 Pulse rate:   93 / minute Resp:     16 per minute BP sitting:   124 /  72  (right arm)  Vitals Entered By: Marrion Coy, CNA (May 24, 2010 11:17 AM)  Physical Exam  General:  Well developed, well nourished, in no acute distress. Head:  normocephalic and atraumatic Eyes:  PERRLA/EOM intact; conjunctiva and lids normal. Mouth:  Oral mucosa normal. Neck:  Neck supple, no JVD. No masses, thyromegaly or abnormal cervical nodes. Chest Wall:  no deformities or breast masses noted Lungs:  clear Abdomen:  Bowel sounds positive; abdomen soft and non-tender without masses, organomegaly, or hernias noted. No hepatosplenomegaly. Msk:  Back normal, normal gait. Muscle strength and tone normal. Extremities:  No clubbing or cyanosi,  , mild swelling and erythema  of the second right toe improved. Neurologic:  Alert and oriented x 3. Skin:  Intact without lesions or rashes. Cervical Nodes:  no significant adenopathy Axillary Nodes:  no significant adenopathy Inguinal Nodes:  no significant adenopathy Psych:  Normal affect.   Detailed Cardiovascular Exam  Neck    Carotids: Carotids full and equal bilaterally without bruits.      Neck Veins: Normal, no JVD.    Heart    Inspection: no deformities or lifts noted.      Palpation: normal PMI with no thrills palpable.      Auscultation: regular rate and rhythm, S1, S2 without murmurs, rubs, gallops, or clicks.    Vascular    Abdominal Aorta: no palpable masses, pulsations, or audible bruits.      Pedal Pulses: absent right dorsalis pedis pulse, absent right posterior tibial pulse, and absent right dorsalis pedis pulse, absent right posterior tibial pulse, and absent left dorsalis pedis pulse.      Radial Pulses: normal radial pulses bilaterally.      Peripheral Circulation: no clubbing, cyanosis, or edema noted with normal capillary refill.     Impression & Recommendations:  Problem # 1:  CAD (ICD-414.00) Given the patient's ongoing arm pain and equivocal stress test results aren't quite concerned about obstructive coronary disease. Therefore, cardiac catheterization is indicated. He knows to take sublingual nitroglycerin with his discomfort and presented to the emergency room should he have any increase in symptoms. We will work with him to schedule his catheterization.  Problem # 2:  PVD (ICD-443.9) Is PVD is mild to moderate and stable. I will encourage walking regimen and risk reduction.  Problem # 3:  HYPERLIPIDEMIA (ICD-272.4) He is on triple therapy. I will her primary provider with a goal LDL of less than 70 and HDL greater than 50.  Patient Instructions: 1)  Your physician recommends that you schedule a follow-up appointment as your cath 2)  Your physician recommends that you continue  on your current medications as directed. Please refer to the Current Medication list given to you today. 3)  Your physician has requested that you have a cardiac catheterization.  Cardiac catheterization is used to diagnose and/or treat various heart conditions. Doctors may recommend this procedure for a number of different reasons. The most common reason is to evaluate chest pain. Chest pain can be a symptom of coronary artery disease (CAD), and cardiac catheterization can show whether plaque is narrowing or blocking your heart's arteries. This procedure is also used to evaluate the valves, as well as measure the blood flow and oxygen levels in different parts of your heart.  For further information please visit [email or web address]  Please follow instruction sheet, as given.

## 2010-07-20 NOTE — Assessment & Plan Note (Signed)
Summary: Cardiology Nuclear Testing  Nuclear Med Background Indications for Stress Test: Evaluation for Ischemia, Stent Patency   History: COPD, Heart Catheterization, Myocardial Infarction, Stents  History Comments: 2/06 Stent-LAD/RCA; 3/06 [id number], EF=55%  Symptoms: Chest Pain    Nuclear Pre-Procedure Cardiac Risk Factors: Claudication, Family History - CAD, History of Smoking, Lipids, NIDDM, PVD Caffeine/Decaff Intake: none NPO After: 9:00 PM Lungs: clear IV 0.9% NS with Angio Cath: 22g     IV Site: R Hand IV Started by: Doyne Keel, CNMT Chest Size (in) 46     Height (in): 69 Weight (lb): 200 BMI: 29.64 Tech Comments: This patient was changed to a Lexiscan/Treadmill due to PVD.  Nuclear Med Study 1 or 2 day study:  1 day     Stress Test Type:  Treadmill/Lexiscan Reading MD:  Arvilla Meres, MD     Referring MD:  J.Hochrein Resting Radionuclide:  Technetium 47m Tetrofosmin     Resting Radionuclide Dose:  11.0 mCi  Stress Radionuclide:  Technetium 51m Tetrofosmin     Stress Radionuclide Dose:  33.0 mCi   Stress Protocol  Max Systolic BP: 192 mm Hg Lexiscan: 0.4 mg   Stress Test Technologist:  Milana Na, EMT-P     Nuclear Technologist:  Domenic Polite, CNMT  Rest Procedure  Myocardial perfusion imaging was performed at rest 45 minutes following the intravenous administration of Technetium 39m Tetrofosmin.  Stress Procedure  The patient received IV Lexiscan 0.4 mg over 15-seconds with concurrent low level exercise and then Technetium 75m Tetrofosmin was injected at 30-seconds while the patient continued walking one more minute.  There were non specific changes with Lexiscan.  Quantitative spect images were obtained after a 45 minute delay.  QPS Raw Data Images:  Normal; no motion artifact; normal heart/lung ratio. Stress Images:  Normal homogeneous uptake in all areas of the myocardium. Rest Images:  Normal homogeneous uptake in all areas of the  myocardium. Subtraction (SDS):  Normal Transient Ischemic Dilatation:  1.17  (Normal <1.22)  Lung/Heart Ratio:  .26  (Normal <0.45)  Quantitative Gated Spect Images QGS EDV:  118 ml QGS ESV:  54 ml QGS EF:  54 % QGS cine images:  Normal  Findings Abormal nuclear study Clinically Abnormal (chest pain, ST abnormality, hypotension)      Overall Impression  Exercise Capacity: Lexiscan with low level exercise Clinical Symptoms: Chest tightness ECG Impression: Markd ST depression Overall Impression: Abnormal stress nuclear study. Overall Impression Comments: There are marked ECG changes with low-level exercise and Lexiscan however perfusion imaging is normal.   Appended Document: Nuclear Testing  has appt 12/7? soon enough? OK I will schedule follow up to discuss cardiac cath.   Appended Document: Cardiology Nuclear Testing to be reviewed at appt in [facility] 05/24/10 in [place]

## 2010-07-26 ENCOUNTER — Ambulatory Visit (INDEPENDENT_AMBULATORY_CARE_PROVIDER_SITE_OTHER): Payer: BC Managed Care – PPO | Admitting: Cardiology

## 2010-07-26 ENCOUNTER — Encounter: Payer: Self-pay | Admitting: Cardiology

## 2010-07-26 DIAGNOSIS — I2510 Atherosclerotic heart disease of native coronary artery without angina pectoris: Secondary | ICD-10-CM

## 2010-07-31 ENCOUNTER — Encounter: Payer: Self-pay | Admitting: Cardiology

## 2010-08-03 NOTE — Assessment & Plan Note (Signed)
Summary: 1 MONTH 414.01 PFN, RN      Allergies Added:   Visit Type:  Follow-up Primary Provider:  Dr. Ardeen [name]  CC:  CAD.  History of Present Illness: The patient present for followup of his known coronary disease. At the last appointment he was having diarrhea and I was hoping it is not related to Effient.  I stopped his Prilosec and started Dexiant and his diarrhea has resolved. Since then he has been doing well. He has had no heartburn. He has had no chest pressure, neck or arm discomfort. He has had no palpitations, presyncope or syncope. He has had no weight gain or edema.  Current Medications (verified): 1)  Fish Oil   Oil (Fish Oil) .Marland Kitchen.. 1 By Mouth Daily 2)  Glipizide 5 Mg Tabs (Glipizide) .Marland Kitchen.. 1 By Mouth Daily 3)  Tricor 145 Mg Tabs (Fenofibrate) .Marland Kitchen.. 1 By Mouth Daily 4)  Metformin Hcl 500 Mg Tabs (Metformin Hcl) .... 2 By Mouth Two Times A Day 5)  Metoprolol Succinate 200 Mg Xr24h-Tab (Metoprolol Succinate) .... 1/2 By Mouth Two Times A Day 6)  Effient 10 Mg Tabs (Prasugrel Hcl) .Marland Kitchen.. 1 By Mouth Daily 7)  Lipitor 80 Mg Tabs (Atorvastatin Calcium) .Marland Kitchen.. 1 By Mouth Daily 8)  Aspirin 81 Mg  Tabs (Aspirin) .Marland Kitchen.. 1 By Mouth Daily 9)  Allopurinol 100 Mg Tabs (Allopurinol) .Marland Kitchen.. 1 By Mouth Daily 10)  Amlodipine Besylate 10 Mg Tabs (Amlodipine Besylate) .Marland Kitchen.. 1 By Mouth Daily 11)  Ramipril 10 Mg Caps (Ramipril) .Marland Kitchen.. 1 By Mouth Daily 12)  Isosorbide Mononitrate Cr 60 Mg Xr24h-Tab (Isosorbide Mononitrate) .Marland Kitchen.. 1 By Mouth Daily 13)  Dexilant 60 Mg Cpdr (Dexlansoprazole) .... Daily  Allergies (verified): 1)  ! Penicillin 2)  ! * Bee Stings  Past History:  Past Medical History: Coronary artery disease (catheterization 2000   revealed nonobstructive disease.  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   12/11 1. Percutaneous stenting   of the distal right coronary artery with drug-eluting stent. Percutaneous stenting   of the distal right coronary proximal posterior descending artery with a drug-eluting stent.) Peripheral vascular disease (He has ABIs of 0.69 on the right and 0.93 on the left.   Vocal cord tumor resected, abdominal  Hyperlipidemia Diabetes mellitus Hiatal hernia  Miild sleep apnea Mild bilateral renal artery stenosis.   Past Surgical History: Reviewed history from 07/05/2010 and no changes required. Hiatal hernia surgery Benign neck tumor  resection Right shoulder arthroscopic surgery Ctaract surgery  Vocal cord polyp removal  Review of Systems       As stated in the HPI and negative for all other systems.   Vital Signs:  Patient profile:   65 year old male Height:      69 inches Weight:      202 pounds BMI:     29.94 Pulse rate:   95 / minute Resp:     16 per minute BP sitting:   142 / 88  (right arm)  Vitals Entered By: [name] Coy, CNA (July 26, 2010 11:27 AM)  Physical Exam  General:  Well developed, well nourished, in no acute distress. Head:  normocephalic and atraumatic Eyes:  PERRLA/EOM intact; conjunctiva and lids normal. Neck:  Neck supple, no JVD. No masses, thyromegaly or  abnormal cervical nodes. Chest Wall:  no deformities  Lungs:  Clear bilaterally to auscultation and percussion. Abdomen:  Bowel sounds positive; abdomen soft and non-tender without masses, organomegaly, or hernias noted. No hepatosplenomegaly. Msk:  Back normal, normal gait. Muscle strength and tone normal. Extremities:  No clubbing or cyanosi,  , mild swelling and erythema of the second right toe improved. Neurologic:  Alert and oriented x 3. Skin:  Intact without lesions or rashes. Cervical Nodes:  no significant adenopathy Inguinal Nodes:  no significant adenopathy Psych:  Normal affect.   Detailed Cardiovascular Exam  Neck    Carotids: Carotids full and  equal bilaterally without bruits.      Neck Veins: Normal, no JVD.    Heart    Inspection: no deformities or lifts noted.      Palpation: normal PMI with no thrills palpable.      Auscultation: regular rate and rhythm, S1, S2 without murmurs, rubs, gallops, or clicks.    Vascular    Abdominal Aorta: no palpable masses, pulsations, or audible bruits.      Pedal Pulses: absent right dorsalis pedis pulse, absent right posterior tibial pulse, and absent right dorsalis pedis pulse, absent right posterior tibial pulse, and absent left dorsalis pedis pulse.      Radial Pulses: normal radial pulses bilaterally.      Peripheral Circulation: no clubbing, cyanosis, or edema noted with normal capillary refill.     Impression & Recommendations:  Problem # 1:  CAD (ICD-414.00) He is having no further symptoms. He will continue with meds as listed and wrist reduction.  Problem # 2:  HYPERLIPIDEMIA (ICD-272.4) I will defer to his primary providers with an LDL goal less than 70 and HDL greater than 40.  Problem # 3:  DM (ICD-250.00) He remains on meds as listed and this is followed closely by Dr. Lysbeth [name].  Patient Instructions: 1)  Your physician recommends that you schedule a follow-up appointment in: 6 months with Dr [name] [name] in [place] 2)  Your physician recommends that you continue on your current medications as directed. Please refer to the Current Medication list given to you today.

## 2010-08-16 ENCOUNTER — Ambulatory Visit: Payer: Self-pay | Admitting: Cardiology

## 2010-08-28 LAB — GLUCOSE, CAPILLARY
Glucose-Capillary: 214 mg/dL — ABNORMAL HIGH (ref 70–99)
Glucose-Capillary: 306 mg/dL — ABNORMAL HIGH (ref 70–99)
Glucose-Capillary: 336 mg/dL — ABNORMAL HIGH (ref 70–99)
Glucose-Capillary: 353 mg/dL — ABNORMAL HIGH (ref 70–99)
Glucose-Capillary: 368 mg/dL — ABNORMAL HIGH (ref 70–99)

## 2010-08-28 LAB — CBC
HCT: 39 % (ref 39.0–52.0)
HCT: 40.2 % (ref 39.0–52.0)
Hemoglobin: 14 g/dL (ref 13.0–17.0)
MCHC: 34.4 g/dL (ref 30.0–36.0)
MCV: 82.9 fL (ref 78.0–100.0)
MCV: 84.2 fL (ref 78.0–100.0)
RDW: 13.1 % (ref 11.5–15.5)
RDW: 13.3 % (ref 11.5–15.5)
WBC: 7.6 10*3/uL (ref 4.0–10.5)
WBC: 7.8 10*3/uL (ref 4.0–10.5)

## 2010-08-28 LAB — BASIC METABOLIC PANEL
BUN: 11 mg/dL (ref 6–23)
BUN: 11 mg/dL (ref 6–23)
BUN: 31 mg/dL — ABNORMAL HIGH (ref 6–23)
CO2: 17 mEq/L — ABNORMAL LOW (ref 19–32)
CO2: 22 mEq/L (ref 19–32)
Calcium: 7.7 mg/dL — ABNORMAL LOW (ref 8.4–10.5)
Calcium: 8.9 mg/dL (ref 8.4–10.5)
Chloride: 102 mEq/L (ref 96–112)
Chloride: 103 mEq/L (ref 96–112)
Creatinine, Ser: 0.53 mg/dL (ref 0.4–1.5)
Creatinine, Ser: 0.97 mg/dL (ref 0.4–1.5)
Creatinine, Ser: 1.19 mg/dL (ref 0.4–1.5)
GFR calc Af Amer: >60 mL/min (ref >60)
GFR calc Af Amer: >60 mL/min (ref >60)
GFR calc Af Amer: >60 mL/min (ref >60)
GFR calc non Af Amer: >60 mL/min (ref >60)
GFR calc non Af Amer: >60 mL/min (ref >60)
Potassium: 3.9 mEq/L (ref 3.5–5.1)
Potassium: 3.9 mEq/L (ref 3.5–5.1)
Sodium: 134 mEq/L — ABNORMAL LOW (ref 135–145)
Sodium: 137 mEq/L (ref 135–145)

## 2010-08-28 LAB — POCT I-STAT, CHEM 8
BUN: 13 mg/dL (ref 6–23)
Calcium, Ion: 1.17 mmol/L (ref 1.12–1.32)
Chloride: 106 mEq/L (ref 96–112)
Creatinine, Ser: 0.4 mg/dL (ref 0.4–1.5)
Creatinine, Ser: 1 mg/dL (ref 0.4–1.5)
Glucose, Bld: 299 mg/dL — ABNORMAL HIGH (ref 70–99)
Hemoglobin: 17 g/dL (ref 13.0–17.0)
Potassium: 3.5 mEq/L (ref 3.5–5.1)
Potassium: 3.7 mEq/L (ref 3.5–5.1)
Sodium: 135 mEq/L (ref 135–145)

## 2010-08-28 LAB — URINE CULTURE
Colony Count: NO GROWTH
Culture  Setup Time: 201112270041
Culture: NO GROWTH

## 2010-08-28 LAB — URINALYSIS, ROUTINE W REFLEX MICROSCOPIC
Glucose, UA: 500 mg/dL — AB
Leukocytes, UA: NEGATIVE
Protein, ur: >300 mg/dL — AB
Urobilinogen, UA: 0.2 mg/dL (ref 0.0–1.0)

## 2010-08-28 LAB — URINE MICROSCOPIC-ADD ON

## 2010-08-28 LAB — PLATELET INHIBITION P2Y12
P2Y12 % Inhibition: 79 %
Platelet Function  P2Y12: 52 [PRU] — ABNORMAL LOW (ref 194–418)
Platelet Function Baseline: 245 [PRU] (ref 194–418)
Platelet Function Baseline: 279 [PRU] (ref 194–418)

## 2010-08-28 LAB — STOOL CULTURE

## 2010-10-31 NOTE — Assessment & Plan Note (Signed)
[facility]                            CARDIOLOGY OFFICE NOTE   NAME:[name], [name]                      MRN:          [id number]  DATE:03/19/2007                            DOB:          April 16, 1946    PRIMARY CARE PHYSICIAN:  [name] [name], M.D.   REASON FOR PRESENTATION:  Evaluate patient with coronary disease and  peripheral vascular disease.   HISTORY OF PRESENT ILLNESS:  Patient is a 65 year old gentleman with  coronary disease, as described below.  He has also had peripheral  vascular disease, which has been treated conservatively with exercise  and Pletal.  Unfortunately, the Pletal did no good and this was stopped.  He was followed by [name] [name].  He says the walking has also done no  good and, in fact, his claudication has progressed.  He says he gets  pain in both his calves when he walks less than 50 yards.  It is  somewhat limiting his job, working at [employer].  He has not been  getting any chest discomfort, neck or arm discomfort.  He has not been  getting any palpitations, presyncope or syncope.  He has not been having  any new shortness of breath, PND or orthopnea.  He is not limited by  dyspnea.  He says he did have a sleep study and that result demonstrated  some mild obstructive sleep apnea.  He has not had followup for this.   PAST MEDICAL HISTORY:  Coronary artery disease (catheterization 2000  revealed nonobstructive disease.  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  He has ABIs of 0.69 on the right and 0.93 on the left.  He  has evidence for common femoral  and superficial artery stenosis on the  right and SFA disease on the left), vocal cord tumor resected, abdominal  hernia repaired, hyperlipidemia, diabetes mellitus, hiatal hernia, mild  sleep apnea, mild bilateral renal artery stenosis.   REVIEW OF SYSTEMS:  As stated in the HPI, and otherwise negative for  other systems.   PHYSICAL EXAMINATION:  The patient is in no distress.  Blood pressure  142/68, heart rate 86 and regular.  Weight 225 pounds, body mass index  31.  HEENT:  Eyelids unremarkable.  Pupils equal, round and reactive to  light.  Fundi not visualized.  Oral mucosa unremarkable.  NECK:  No jugular venous distention at 45 degrees.  Carotid upstroke  brisk and symmetric.  No bruits, no thyromegaly.  LYMPHATICS:  No cervical, axillary or inguinal adenopathy.  LUNGS:  Clear to auscultation bilaterally.  BACK:  No costovertebral angle tenderness.  CHEST:  Unremarkable.  HEART:  PMI not displaced or sustained.  S1 and S2 within normal limits.  No S3, no S4, no clicks,  no rubs, no murmurs.  ABDOMEN:  Obese, positive bowel sounds, normal in frequency and pitch.  No bruits, no rebound, no guarding, no midline pulsatile mass, no  hepatomegaly, no splenomegaly.  SKIN:  No rashes, no nodules.  EXTREMITIES:  Two-plus upper pulses, 2+ right femoral, 1+ left femoral,  absent dorsalis pedis and posterior tibialis bilaterally, no cyanosis,  no clubbing, no edema.  NEUROLOGIC:  Oriented to person, place and time.  Cranial nerves II  through XII grossly intact.  Motor grossly intact.   EKG:  Sinus rhythm, rate 86, axis within normal limits, intervals within  normal limits, no acute STT-wave changes.   ASSESSMENT AND PLAN:  1. Coronary disease:  He is not having any symptoms related to      progression of this.  However, he is not participating aggressively      in secondary risk reduction.  I have given him written instructions      to get a liver profile and liver enzymes done at  [name] [name]      office.  He understands the importance of having his diabetes      followed and he will follow with Dr. Lysbeth [name] more closely.  (The      patient has not participated in suggested followup.)  2. Peripheral vascular disease:  He adamantly does not want surgery.      I will review this with Dr. Excell [name].  The next step may be a      dedicated lower extremity CT to evaluate his peripheral vascular      disease.  3. Obesity:  He understands the need to lose weight with diet and      exercise.  4. Hypertension:  His blood pressure is very slightly elevated, but it      has not been otherwise.  I am going to encourage therapeutic      lifestyle changes to include weight-loss, which will probably bring      him down to a reasonable blood pressure, which should be 130/80.  5. Followup:  I will see him back in six months or sooner, based on      plans for followup of his lower extremities.     [name] Rotunda, MD, [facility]  Electronically Signed    JH/MedQ  DD: 03/19/2007  DT: 03/19/2007  Job #: [id number]   cc:   [name] [name], M.D.

## 2010-11-03 NOTE — Assessment & Plan Note (Signed)
[facility]                              CARDIOLOGY OFFICE NOTE   NAME:Messmer, Hamza                      MRN:          [id number]  DATE:04/17/2006                            DOB:          [birth year]    PRIMARY CARE PHYSICIAN:  Delaney Meigs, M.D.   HISTORY OF PRESENT ILLNESS:  Mr. Beamer is a 65 year old gentleman with  coronary and peripheral vascular disease in the setting of hypertension,  diabetes mellitus, and hypercholesterolemia.  Cardiac catheterization in  August 2006 demonstrated mild nonobstructive disease with normal left  ventricular size and systolic function.  He has right common femoral and  superficial arterial stenosis and SFA disease on the left with an ABI of  0.69 on the right and 0.93 on the left.  Symptoms are only mildly life-style  limiting.  He had previously told me that these improved with Pletal and  exercise therapy.  He now thinks the Pletal had no help.  He is not  particularly compliant with the exercise.  He has certainly had no rest pain  or ulceration and his symptoms have not worsened.   CURRENT MEDICATIONS:  1. Tricor 145 mg daily.  2. Allopurinol 100 mg daily.  3. Metformin 500 mg twice daily.  4. Altace 10 mg daily.  5. Vytorin 10/40 one daily.  6. AcipHex 20 mg daily.  7. Enteric coated aspirin 81 mg daily.  8. Norvasc 5 mg daily.  9. Plavix 75 mg daily.  10.Toprol XL 100 mg in the morning and 50 mg in the evening.  11.Avandaryl 4/2 one tablet twice per day.   PHYSICAL EXAMINATION:  He is generally well-appearing in no distress with  heart rate 76, blood pressure 124/72 and equal bilaterally.  Weight is 217  pounds.  He had no jugular venous distention and no thyromegaly.  LUNGS:  Clear to auscultation.  He has a nondisplaced point of maximal cardiac impulse.  There is a regular  rate and rhythm without murmur, rub, or gallop.  ABDOMEN:  Obese, soft, nondistended, nontender.  No  hepatosplenomegaly.  Bowel sounds are normal.  EXTREMITIES:  Warm without clubbing, cyanosis, edema, or ulceration.  Femoral pulses are 2+ bilaterally without bruit.  On the left the PT pulse  is 2+ and on the right PT and DP are barely palpable.  He is alert and oriented x3 with normal affect and normal neurologic exam.   ELECTROCARDIOGRAM:  Demonstrates normal sinus rhythm with nonspecific ST-T  abnormalities inferiorly and apically which are new compared with February 05, 2006.   IMPRESSION/RECOMMENDATIONS:  1. Coronary disease:  Asymptomatic with good exercise tolerance.      Electrocardiogram is slightly different than before in leads III and      aVF.  However, with no symptoms; and only a minor change in the      electrocardiogram.  Will not pursue further.  2. Hypertension nicely controlled.  Again, offered to switch from Altace      to lisinopril to save money.  The patient does not wish to do so.  3. Question sleep apnea.  The patient failed to show up for his      appointment with Dr. Craige Cotta in 2006.  Study showed mild obstructive sleep      with an oxygen saturation nadir of 84%.  Encouraged followup with Dr.      Craige Cotta.  4. Diabetes mellitus.  5. Hypercholesterolemia:  Followed by Dr. Lysbeth Galas.   No changes in medications.  Followup in 1 week.     Salvadore Farber, MD  Electronically Signed    WED/MedQ  DD: 04/17/2006  DT: 04/18/2006  Job #: [id number]   cc:   Delaney Meigs, M.D.

## 2010-11-03 NOTE — Procedures (Signed)
NAMEDONTAYE, [name] [name] NO.:  [id number]   MEDICAL RECORD NO.:  [id number]          PATIENT TYPE:  OUT   LOCATION:  SLEEP CENTER                 FACILITY:  [facility]   PHYSICIAN:  Coralyn Helling, M.D.      DATE OF BIRTH:  1945/11/01   DATE OF STUDY:  03/04/2005                              NOCTURNAL POLYSOMNOGRAM   INDICATIONS FOR STUDY:  Patient presents with loud snoring and daytime  fatigue. Epworth score is 12.   MEDICATIONS:  Aspirin, Tricor, Altace, Avandaryl, Allopurinol, Metformin,  Plavix, Vytorin, Toprol, Pletal, AcipHex, and Norvasc.   SLEEP ARCHITECTURE:  There is 383 minutes of recording time. Total sleep  time was 347.5  minutes for a sleep efficiency of 86%. Sleep onset was 8  minutes, which was reduced. REM latency was 155 minutes, which was mildly  prolonged. All stages of sleep were observed and the patient was observed in  both the supine and non-supine position.   RESPIRATORY DATA:  The overall apnea/hypopnea index was 8.9, the  apnea/hypopnea index on REM was 7.9 and non-REM was 9.1. Supine  apnea/hypopnea index was 13.9 versus 4.8 in the non-supine position.   OXYGEN DATA:  Oxygen saturation nadir was 84%. He spent 165.2 minutes with  an oxygen saturation between 91% to 100% and 192.6 minutes with an oxygen  saturation between 81% to 90%.   CARDIAC DATA:  Sinus rhythm with occasional PVCs. Heart rate ranged from 58  to 107 beats per minute in non-REM sleep and 61 to 92 beats per minute in  REM sleep.   MOVEMENT/PARASOMNIA:  Periodic limb movement index was 1.7.   IMPRESSION/RECOMMENDATIONS:  Mild obstructive sleep apnea as demonstrated by  apnea/hypopnea index of 8.9 with an oxygen saturation nadir of 84%. The  majority of events were noted in the supine position. Also noted was  prolonged nocturnal hyperventilation.   RECOMMENDATIONS:  The patient should be consulted with regard to diet,  exercise, and weight reduction. He should also be  consulted with regards to  positional therapy for his sleep apnea. If his symptoms persist after this  consideration should be made for a CPAP therapy, oral appliance, or surgical  intervention for his obstructive sleep  apnea. Additionally, he should be evaluated for any other underlying  pulmonary disease given the degree of his oxygen desaturations and the  prolonged nature of this.      Coralyn Helling, M.D.  Diplomat, Biomedical engineer of Sleep Medicine  Electronically Signed     VS/MEDQ  D:  03/13/2005 16:37:44  T:  03/14/2005 13:00:18  Job:  [id number]

## 2010-11-03 NOTE — Cardiovascular Report (Signed)
NAMEARIANA, [name] [name] NO.:  [id number]   MEDICAL RECORD NO.:  [id number]          PATIENT TYPE:  OIB   LOCATION:  2899                         FACILITY:  [facility]   PHYSICIAN:  Salvadore Farber, M.D. LHCDATE OF BIRTH:  1946/03/21   DATE OF PROCEDURE:  01/17/2005  DATE OF DISCHARGE:                              CARDIAC CATHETERIZATION   PROCEDURE:  Left heart catheterization, left ventriculography, coronary  angiography, abdominal aortography, left subclavian angiography.   INDICATIONS FOR PROCEDURE:  [name] [name] is a 65 year old gentleman with  coronary disease who presents with exertional left arm discomfort as well as  bilateral leg discomfort with walking.   PROCEDURE TECHNIQUE:  Informed consent was obtained.  Under 1% lidocaine  local anesthesia, a 5 French sheath was placed in the right femoral artery  using the modified Seldinger technique.  Diagnostic angiography and  ventriculography were performed using JL4 catheter for the native LAD, JR4  catheter for the RCA, RCB catheter for the anomalous circumflex which arises  just anterior to and below the RCA.  The RCB catheter was then used to  selectively engage the left subclavian artery.  Angiography was performed by  hand injection.  Left heart catheterization and ventriculography were then  performed using a pigtail catheter.  The pigtail catheter was then pulled  back to the suprarenal abdominal aorta.  Abdominal aortography with run off  to the level of the proximal left SFA was performed by power injection.  The  patient tolerated the procedure well and was transferred to the holding room  in stable condition.   COMPLICATIONS:  None.   FINDINGS:  1.  Left ventricle:  132/12/21.  EF approximately 55% without regional wall      motion abnormality.  2.  No aortic stenosis or mitral regurgitation.  3.  Left main:  There is no left main, there are separate origins of the LAD      and circumflex.  4.   LAD:  A proximal 20% stenosis, there is another 20% stenosis after the      take off of the first diagonal.  After the take off of the third      diagonal, there is a previously placed stent which is widely patent with      no in stent restenosis.  5.  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  There is a 40% stenosis of the mid vessel.  6.  Right coronary artery:  Moderate size dominant vessel.  There was a      previously placed stent in the mid vessel with approximately 10% in      stent restenosis.  7.  Left subclavian:  Angiographically normal.  8.  Abdominal aorta:  Minimal plaque with no evidence of aneurysm and no      significant stenosis.  9.  Renal arteries:  There are two renal arteries on the left and one on the      right.  All are angiographically normal.  10. Lower extremities:  There is  no significant iliac disease on either      side.  The left common femoral is normal.  There are mild stenoses at      the origin of the left SFA and left profunda.  These are less than 50%      each.   IMPRESSION/PLAN:  The patient has no vascular explanation for his left arm  pain.  Specifically, there is no significant coronary lesions to produce  angina.  Further, there is no evidence of subclavian stenosis to provoke  left  arm claudication.  I suspect this pain is nonvascular.  Furthermore, he has  normal iliac arteries bilaterally.  Will continue with medical therapy for  his coronary disease and proceed with noninvasive evaluation of the  remainder of his lower extremities for possible claudication.       WED/MEDQ  D:  01/17/2005  T:  01/17/2005  Job:  [id number]   cc:   Pricilla Riffle, M.D.

## 2010-11-03 NOTE — Procedures (Signed)
[name], [name] [name] NO.:  [id number]   MEDICAL RECORD NO.:  [id number]          PATIENT TYPE:  OUT   LOCATION:  SLEEP CENTER                 FACILITY:  [facility]   PHYSICIAN:  Coralyn Helling, M.D.      DATE OF BIRTH:  Sep 18, 1945   DATE OF STUDY:  03/04/2005                              NOCTURNAL POLYSOMNOGRAM   ADDENDUM:   SLEEP ARCHITECTURE:  Frequent eye movements were noted during all stages of  sleep.   IMPRESSION:  Frequent eye movements were noted during all stages of sleep  and this can be seen with patients who are on selective serotonin re-uptake  inhibitors.      Coralyn Helling, M.D.  Diplomat, Biomedical engineer of Sleep Medicine  Electronically Signed     VS/MEDQ  D:  03/13/2005 16:44:40  T:  03/14/2005 13:02:50  Job:  [id number]

## 2010-11-03 NOTE — Cardiovascular Report (Signed)
NAME:  [name] [name], [name] [name] NO.:  [id number]   MEDICAL RECORD NO.:  [id number]          PATIENT TYPE:  INP   LOCATION:  4708                         FACILITY:  [facility]   PHYSICIAN:  [name] [name], M.D. [facility] DATE OF BIRTH:  23-Mar-1946   DATE OF PROCEDURE:  DATE OF DISCHARGE:                              CARDIAC CATHETERIZATION   CLINICAL HISTORY:  [name] [name] is a 65 years old and had previous  catheterization which showed nonobstructive disease.  He was admitted to the  hospital on February 17 with unstable angina and scheduled for evaluation  angiography today.   He has marked dyslipoproteinemia with a very high triglyceride and low HDL.   PROCEDURE:  The procedure was performed via the right femoral arterial  sheath and 6 French sheath coronary catheters.  A femoral arterial approach  was performed and nonopaque contrast was used.  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  After completion of the diagnostic study, proceeded  with intervention on the right coronary artery and the left anterior  descending artery.   The patient was enrolled in the COSTAR study and was randomized to COSTAR  study stent for both vessels.  We first approached the right coronary  artery.  The patient was given Angiomax bolus in infusion was given 300 mg  of Plavix.  We used a JR-4 6 [place] guiding catheterization with side holes  and an [employer]ahi soft wire.  We crossed the lesion in the right coronary artery  with the wire without difficulty.  We predilated with a 3.25 x 15 mm Quantum  Maverick balloon performing two inflations at 12 atmospheres for 30 seconds.  We then deployed a 3.5 x 28 mm study stent deploying this with one inflation  of 14 atmospheres for 30 seconds.  We post dilated with a 3.75 x 20 mm  Quantum Maverick performing two inflations up to 18 atmospheres for 30  seconds.   We then approached  the left anterior descending artery.  We used a Q 3.5 x 6  [place] guiding catheter with side holes and an [employer]ahi soft wire.  We crossed  the lesion with the wire without difficulty.  We predilated with a 2.25 x 12  mm Maverick performing two inflations up to 10 atmospheres for 30 seconds.  We then deployed a 2.5 x 16 mm COSTAR study stent deploying this with one  inflation of 10 atmospheres for 30 seconds.  We only deployed 10 atmospheres  because for concern of being over-sized for the distal reference vessel.  We  then post dilated with a 2.5 x 12 mm Quantum Maverick performing two  inflations up to 18 atmospheres for 30 seconds.  Repeat diagnostic study was  then performed through the guiding catheter.  The patient tolerated the  procedure well and left the laboratory in satisfactory condition.   RESULTS:  The right aortic pressure was 133/78 with mean of 102 and left  aortic pressure was 133/19.   There was no left main coronary artery since the circumflex  arose  anomalously from the right coronary cusp.  The left anterior descending  artery gave rise to three diagonal branches and septal perforator.  There  was 80% focal narrowing after the third diagonal branch in the mid vessel  and irregularities distally.   The right coronary artery is a moderately large vessel and gave rise to a  right ventricular branch, a posterior descending branch, and a  posterolateral branch.  There was 90% stenosis in the mid vessel.  There was  80% stenosis in the mid to distal portion of the posterior descending  branch.   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  This  supplied two marginal vessels.  There was a 70% stenosis in the mid portion  of this vessel.   The left ventriculogram performed in the RAO projection showed good wall  motion with no areas of hypokinesis.  The estimated ejection fraction was  60%.   Following stenting of the lesion in the mid right  coronary artery, stenosis  improved from 90% to 0%.   Following stenting of the lesion in the mid left anterior descending artery,  this also improved from 80% to 0%.   CONCLUSION:  1.  Coronary artery disease with 80% narrowing of mid left anterior      descending artery, 90% stenosis in the mid right coronary artery with      80% stenosis in the mid to distal posterior descending branch, 70%      stenosis in the mid portion of the circumflex which arose anomalously      from the right coronary cusp.  2.  Successful stenting of the mid right coronary artery lesion using a      COSTAR drug-eluting study stent with improvement in stenosal narrowing      from 90% to 0%.  3.  Successful stenting of the lesion in the mid left anterior descending      artery using a COSTAR drug-eluting study stent with improvement in the      stenosal narrowing from 80% to 0%.   DISPOSITION:  The patient to return to __________  for further observation  and monitoring.  We will plan discharge tomorrow if the patient remains  stable.  She will remain on Plavix for a year.      BB/MEDQ  D:  08/07/2004  T:  08/07/2004  Job:  [id number]   cc:   [facility]   Pricilla Riffle, M.D.   Cardiopulmonary Lab

## 2010-11-03 NOTE — Discharge Summary (Signed)
[name], [name]             ACCOUNT NO.:  [id number]   MEDICAL RECORD NO.:  [id number]          PATIENT TYPE:  INP   LOCATION:  6525                         FACILITY:  [facility]   PHYSICIAN:  Gene Serpe, P.A. [facility]   DATE OF BIRTH:  07/26/1945   DATE OF ADMISSION:  08/04/2004  DATE OF DISCHARGE:  08/08/2004                           DISCHARGE SUMMARY - REFERRING   PROCEDURE:  Coronary angiography/stenting (COSTAR) left anterior descending  and right coronary artery August 07, 2004.   REASON FOR ADMISSION:  [name] [name] is a 65 year old male, patient of Dr.  Dietrich Pates, with history of noncritical coronary artery disease by previous  catheterization in 2000, who presented to the office with symptoms worrisome  for unstable angina pectoris.  Please refer to admission note for full  details.   LABORATORY DATA:  Serial cardiac enzymes normal.  Lipid profile:  Total  cholesterol 165, triglycerides 762, HDL 20, LDL not calculated  (cholesterol/HDL ratio 8.3).  TSH 1.89.  Electrolytes, renal function normal  on admission.  Marginally elevated ALT 41.  Normal CBC on admission.  Hemoglobin A1C 6.6.   Admission chest x-ray:  Mild COPD/vascular congestion.   C-spine films:  Negative.   HOSPITAL COURSE:  Following direct admission from the office, for evaluation  and management of symptoms worrisome for unstable angina pectoris, patient  was placed on a medication regimen including home medications with the  addition of intravenous nitroglycerin and heparin.  Serial cardiac markers  were all within normal limits.   Prior to scheduled cardiac catheterization, patient was referred for x-ray  of the cervical spine, per Dr. Dietrich Pates, for evaluation of neck discomfort  and possible radiculopathy.  These x-rays were negative.   Cardiac catheterization, performed by Dr. Charlies Constable on August 07, 2004,  (see report for full details), revealed three vessel coronary artery disease  with 80%  mid LAD, 70% mid circumflex (anomalous) and 90% mid RCA and 80%  PDA.  Left ventriculogram was normal (EF 60%).   Dr. Juanda Chance preceded with vessel stenting (COSTAR) of the 80% LAD and of the  90% RCA stenoses, both to 0% residual stenosis with no noted complications.   Patient was kept overnight for observation and cleared for discharge the  following morning in hemodynamically stable condition.  There were no noted  complications of the right groin incision site.   The patient was also referred for tobacco cessation consultation, prior to  discharge.   DISCHARGE MEDICATIONS:  1.  Plavix 75 mg daily (at least one year).  2.  Enteric coated aspirin 325 mg daily.  3.  Toprol 50 mg daily.  4.  Zocor 20 mg q.h.s.  5.  Tricor 145 mg daily.  6.  Allopurinol 100 mg daily.  7.  Nitrostat 0.4 mg p.r.n.   INSTRUCTIONS:  No heavy lifting/driving x2 days.  Maintain low  fat/cholesterol diet.  Call the office if there is any swelling/bleeding of  the groin.   The patient is strongly encouraged to stop smoking tobacco.   The patient cleared to return to work on Monday, August 14, 2004.   The patient  will follow up with Dr. Dietrich Pates on Monday, August 21, 2004, at  10:15 a.m.   The patient instructed to establish with Dr. Hulan Fess in the next few  weeks for medical management.   DISCHARGE DIAGNOSES:  1.  Unstable angina pectoris.      1.  Normal serial cardiac markers.      2.  Status post stenting (COSTAR) left anterior descending and right          coronary artery August 07, 2004.      3.  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      4.  Normal left ventricular function.  2.  Hyperglycemia.  3.  Mixed dyslipidemia.  4.  Tobacco.      GS/MEDQ  D:  08/08/2004  T:  08/08/2004  Job:  [id number]   cc:   Delaney Meigs, M.D.  [address]  [place]  [place] [address]  Fax: [phone]

## 2010-11-03 NOTE — H&P (Signed)
[name] [name], CROCHET NO.:  [id number]   MEDICAL RECORD NO.:  [id number]          PATIENT TYPE:  INP   LOCATION:                               FACILITY:  [facility]   PHYSICIAN:  Pricilla Riffle, M.D.    DATE OF BIRTH:  07-Dec-1945   DATE OF ADMISSION:  08/04/2004  DATE OF DISCHARGE:                                HISTORY & PHYSICAL   REASON FOR ADMISSION:  Mr. Nicodemus is a 65 year old male with known coronary  artery disease followed by Dr. Dietrich Pates who was last seen here in the  office in November 2004.  He now presents with a two to three week history  of intermittent chest discomfort worse with exertion and associated with  progressive exertional dyspnea.   The patient's cardiac history notable for noncritical coronary artery  disease by catheterization in September 2000.  This revealed normal LMCA and  LAD; 30-50% CFX (anomalous rise from the right coronary artery); 30-50% mid  RCA; 30-50% PDA.  Left ventricular function was normal and distal aortogram  revealed no significant atherosclerosis.   The patient then presented for a stress Cardiolite in September 2004  revealing no large area of ischemia, but question of  slight ischemia/scar  at the base of the inferior wall; EF 52%.   The patient reports development of recurrent mid sternal chest pressure over  these past 2-3 weeks, with rare radiation to the neck, but with bilateral  arm  heaviness and discomfort over these past few weeks.  He also notes  progressive exertional dyspnea and has had occasional PND as well as  orthopnea.  He notes no pedal edema.   Electrocardiogram today reveals normal sinus rhythm at 86 BPM with normal  axis and nonspecific ST changes.   CURRENT MEDICATIONS:  1.  Aspirin 81 daily.  2.  Zocor 20 mg daily.  3.  Toprol XL 50 daily.  4.  Tricor 145 daily.  5.  Allopurinol 100 daily.   ALLERGIES:  PENICILLIN.   PAST MEDICAL HISTORY:  Coronary artery disease (as described  above),  dyslipidemia, status post hiatal hernia surgery, benign neck tumor  resection, status post right shoulder arthroscopic surgery, status post  vocal cord polyp removal, history of leukocytosis/thrombocytopenia- followed  by Dr. Arlan Organ.   SOCIAL HISTORY:  The patient lives in [place] with his wife.  They have  three children.  He works for [employer].  He continues to smoke  anywhere from 1-2 packs a day.  Denies alcohol use.  Father deceased with  history of myocardial infarction.   REVIEW OF SYSTEMS:  As noted per HPI.  Has occasional heartburn symptoms,  but denies any history of peptic ulcer disease.  No recent evidence of upper  or lower GI bleeding.  Has occasional left calf discomfort with walking.  Remaining systems negative.   PHYSICAL EXAMINATION:  VITAL SIGNS:  Blood pressure 120/66, pulse 86  regular, weight 206.  GENERAL:  A 65 year old male in no apparent distress.  HEENT:  Normocephalic, atraumatic.  NECK:  Preserved bilateral carotid pulses without bruits.  LUNGS:  Clear  to auscultation in all fields.  HEART:  Regular rate and rhythm (S1, S2).  No murmurs, rubs or gallops.  ABDOMEN:  Soft, nontender, intact bowel sounds without bruits.  EXTREMITIES:  Preserved bilateral femoral pulses without bruits.  Minimally  palpable popliteal all pulses bilaterally.  Strong left posterior tibialis  pulse with nonpalpable right posterior tibialis pulse.  Nonpalpable dorsalis  pedis pulses.  No pedal edema.  NEURO:  No focal deficit.   IMPRESSION:  1.  Unstable angina pectoris.  2.  Coronary artery disease.      1.  Noncritical coronary artery disease by cardiac catheterization in          2000.      2.  Low risk exercise Cardiolite, September 2004.  3.  Tobacco.  4.  Dyslipidemia.   PLAN:  The patient will be admitted directly to [facility] for  management of symptoms worrisome for unstable angina pectoris.  Recommendation to proceed with  diagnostic cardiac catheterization which we  will try to do later today if possible.  The patient is agreeable with this  plan.  The risks, benefits have been discussed.  We will continue aspirin  and Toprol and add intravenous nitroglycerin and heparin.  We will also add  a proton pump inhibitor as well.  The patient was seen and examined in  conjunction with Dr. Dietrich Pates.      ________________________________________  Rozell Searing, P.A. [facility]  ___________________________________________  Pricilla Riffle, M.D.    GS/MEDQ  D:  08/04/2004  T:  08/04/2004  Job:  [id number]

## 2011-01-18 ENCOUNTER — Encounter: Payer: Self-pay | Admitting: Cardiology

## 2011-01-31 ENCOUNTER — Ambulatory Visit (INDEPENDENT_AMBULATORY_CARE_PROVIDER_SITE_OTHER): Payer: BC Managed Care – PPO | Admitting: Cardiology

## 2011-01-31 ENCOUNTER — Encounter: Payer: Self-pay | Admitting: Cardiology

## 2011-01-31 DIAGNOSIS — I2510 Atherosclerotic heart disease of native coronary artery without angina pectoris: Secondary | ICD-10-CM

## 2011-01-31 DIAGNOSIS — I739 Peripheral vascular disease, unspecified: Secondary | ICD-10-CM

## 2011-01-31 DIAGNOSIS — E785 Hyperlipidemia, unspecified: Secondary | ICD-10-CM

## 2011-01-31 NOTE — Assessment & Plan Note (Signed)
Followed by Dr. Nyland. 

## 2011-01-31 NOTE — Assessment & Plan Note (Signed)
He had stable mildly reduced ABIs in Nov.  Leg pain is intermittent.  No further testing is indicated.

## 2011-01-31 NOTE — Assessment & Plan Note (Signed)
No chest pain since the most recent PCI.  The patient has no new sypmtoms.  No further cardiovascular testing is indicated.  We will continue with aggressive risk reduction and meds as listed.

## 2011-01-31 NOTE — Patient Instructions (Signed)
Follow up in 1 year with Dr Hochrein.  You will receive a letter in the mail 2 months before you are due.  Please call us when you receive this letter to schedule your follow up appointment.  The current medical regimen is effective;  continue present plan and medications.  

## 2011-01-31 NOTE — Progress Notes (Signed)
HPI The patient presents for follow up of his CAD.  Since I last saw him he has done well.  The patient denies any new symptoms such as chest discomfort, neck or arm discomfort. There has been no new shortness of breath, PND or orthopnea. There have been no reported palpitations, presyncope or syncope.  He does report occasional calf pain with walking.  However, this is unchanged since his ABIs in Nov.  Allergies  Allergen Reactions  . Penicillins     Current Outpatient Prescriptions  Medication Sig Dispense Refill  . allopurinol (ZYLOPRIM) 100 MG tablet Take 100 mg by mouth daily.        Marland Kitchen amLODipine (NORVASC) 10 MG tablet Take 10 mg by mouth daily.        Marland Kitchen aspirin 81 MG tablet Take 81 mg by mouth daily.        Marland Kitchen atorvastatin (LIPITOR) 80 MG tablet Take 80 mg by mouth daily.        Marland Kitchen dexlansoprazole (DEXILANT) 60 MG capsule Take 60 mg by mouth daily.        . fenofibrate (TRICOR) 145 MG tablet Take 145 mg by mouth daily.        . Fish Oil OIL Take 1 capsule by mouth daily.        Marland Kitchen glipiZIDE (GLUCOTROL) 5 MG tablet Take 10 mg by mouth 2 (two) times daily before a meal.       . isosorbide mononitrate (IMDUR) 60 MG 24 hr tablet Take 60 mg by mouth daily.        Marland Kitchen losartan (COZAAR) 50 MG tablet Take 50 mg by mouth daily.        . metFORMIN (GLUCOPHAGE) 500 MG tablet Take 1,000 mg by mouth 2 (two) times daily.        . metoprolol (TOPROL-XL) 200 MG 24 hr tablet Take 100 mg by mouth 2 (two) times daily.        . prasugrel (EFFIENT) 10 MG TABS Take 10 mg by mouth daily.        . ramipril (ALTACE) 10 MG capsule Take 10 mg by mouth daily.        . sitaGLIPtin (JANUVIA) 100 MG tablet Take 100 mg by mouth daily.        . Tamsulosin HCl (FLOMAX) 0.4 MG CAPS Take 0.4 mg by mouth daily.          Past Medical History  Diagnosis Date  . 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 PVD (peripheral vascular disease)     ABIs of 0.69 on right and 0.93 on left  . Hyperlipidemia   . Diabetes mellitus   . Hiatal hernia   . Sleep apnea     Mild  . Renal artery stenosis     Mild, bilateral    Past Surgical History  Procedure Date  . Soft tissue tumor resection     Vocal cord tumor, abdominal  . Hiatal hernia repair   . Soft tissue tumor resection     Benign neck tumor  . Shoulder arthroscopy     Right  . Cataract extraction   . Vocal cord  polyp removal     ROS:  Burping.  Otherwise as stated in the HPI and negative for all other systems.  PHYSICAL EXAM BP 132/82  Pulse 97  Resp 16  Ht 5\' 10"  (1.778 m)  Wt 207 lb (93.895 kg)  BMI 29.70 kg/m2 GENERAL:  Well appearing HEENT:  Pupils equal round and reactive, fundi not visualized, oral mucosa unremarkable, poor dentition NECK:  No jugular venous distention, waveform within normal limits, carotid upstroke brisk and symmetric, no bruits, no thyromegaly LYMPHATICS:  No cervical, inguinal adenopathy LUNGS:  Clear to auscultation bilaterally BACK:  No CVA tenderness CHEST:  Unremarkable HEART:  PMI not displaced or sustained,S1 and S2 within normal limits, no S3, no S4, no clicks, no rubs, no murmurs ABD:  Flat, positive bowel sounds normal in frequency in pitch, no bruits, no rebound, no guarding, no midline pulsatile mass, no hepatomegaly, no splenomegaly EXT:  2 plus pulses throughout, no edema, no cyanosis no clubbing, absent DP bilateral but PT palpable. SKIN:  No rashes no nodules NEURO:  Cranial nerves II through XII grossly intact, motor grossly intact throughout PSYCH:  Cognitively intact, oriented to person place and time  EKG: Sinus  rhythm, rate 97, axis within normal limits, intervals within normal limits, no acute ST-T wave changes.   ASSESSMENT AND PLAN

## 2011-08-30 ENCOUNTER — Other Ambulatory Visit: Payer: Self-pay | Admitting: Cardiology

## 2011-08-31 NOTE — Telephone Encounter (Signed)
Refilled effient.

## 2011-09-18 ENCOUNTER — Other Ambulatory Visit: Payer: Self-pay

## 2011-09-18 MED ORDER — ISOSORBIDE MONONITRATE ER 60 MG PO TB24: 60.0000 mg | ORAL_TABLET | Freq: Every day | ORAL | Status: DC

## 2011-09-18 NOTE — Telephone Encounter (Signed)
..   Requested Prescriptions   Signed Prescriptions Disp Refills  . isosorbide mononitrate (IMDUR) 60 MG 24 hr tablet 30 tablet 5    Sig: Take 1 tablet (60 mg total) by mouth daily.    Authorizing Provider: Rollene Rotunda    Ordering User: Christella Hartigan, Sneha Willig Judie Petit

## 2011-09-19 ENCOUNTER — Telehealth: Payer: Self-pay | Admitting: *Deleted

## 2011-09-19 NOTE — Telephone Encounter (Signed)
Pt wants 90 days supply sent to [facility]

## 2011-09-19 NOTE — Telephone Encounter (Signed)
Pt wanted 90 day supply called in and I did

## 2011-09-20 ENCOUNTER — Other Ambulatory Visit: Payer: Self-pay | Admitting: *Deleted

## 2012-01-29 ENCOUNTER — Other Ambulatory Visit: Payer: Self-pay | Admitting: Cardiology

## 2012-01-29 NOTE — Telephone Encounter (Signed)
..   Requested Prescriptions   Pending Prescriptions Disp Refills  . EFFIENT 10 MG TABS [Pharmacy Med Name: EFFIENT 10 MG TABLET] 90 tablet 0    Sig: TAKE 1 TABLET EVERY DAY  .Patient needs to contact office to schedule  Appointment  for future refills.[phone]. Thank you.

## 2012-08-06 ENCOUNTER — Other Ambulatory Visit: Payer: Self-pay | Admitting: Cardiology

## 2012-08-06 NOTE — Telephone Encounter (Signed)
.   Requested Prescriptions   Pending Prescriptions Disp Refills  . EFFIENT 10 MG TABS [Pharmacy Med Name: EFFIENT 10 MG TABLET] 30 tablet 0    Sig: TAKE 1 TABLET EVERY DAY  .Marland KitchenPatient needs to contact office to schedule  Appointment  for future refills.Ph:[phone]. Thank you.

## 2012-08-20 ENCOUNTER — Encounter: Payer: Self-pay | Admitting: Cardiology

## 2012-08-20 ENCOUNTER — Ambulatory Visit (INDEPENDENT_AMBULATORY_CARE_PROVIDER_SITE_OTHER): Payer: BC Managed Care – PPO | Admitting: Cardiology

## 2012-08-20 VITALS — BP 149/84 | HR 85 | Ht 70.0 in | Wt 197.0 lb

## 2012-08-20 DIAGNOSIS — I701 Atherosclerosis of renal artery: Secondary | ICD-10-CM

## 2012-08-20 DIAGNOSIS — I2510 Atherosclerotic heart disease of native coronary artery without angina pectoris: Secondary | ICD-10-CM

## 2012-08-20 DIAGNOSIS — I739 Peripheral vascular disease, unspecified: Secondary | ICD-10-CM

## 2012-08-20 DIAGNOSIS — E119 Type 2 diabetes mellitus without complications: Secondary | ICD-10-CM

## 2012-08-20 DIAGNOSIS — E785 Hyperlipidemia, unspecified: Secondary | ICD-10-CM

## 2012-08-20 MED ORDER — PRASUGREL HCL 10 MG PO TABS: ORAL_TABLET | ORAL | Status: DC

## 2012-08-20 NOTE — Progress Notes (Signed)
HPI The patient presents for follow up of his CAD.  Since I last saw him he has done well.  The patient denies any new symptoms such as chest discomfort, neck or arm discomfort. There has been no new shortness of breath, PND or orthopnea. There have been no reported palpitations, presyncope or syncope.  He does report leg pain with walking. [name] [name]  However, this is unchanged from previous.  He has had stable mildly abnormal ABIs in the past.  Unfortunately he tells me that Dr. Lysbeth Galas is not happy with his cholesterol or diabetes control.  Allergies  Allergen Reactions  . Penicillins     Current Outpatient Prescriptions  Medication Sig Dispense Refill  . allopurinol (ZYLOPRIM) 100 MG tablet Take 100 mg by mouth daily.        [name] [name] amLODipine (NORVASC) 10 MG tablet Take 10 mg by mouth daily.        [name] [name] aspirin 81 MG tablet Take 81 mg by mouth daily.        [name] [name] atorvastatin (LIPITOR) 80 MG tablet Take 80 mg by mouth daily.        [name] [name] dexlansoprazole (DEXILANT) 60 MG capsule Take 60 mg by mouth daily.        [name] [name] EFFIENT 10 MG TABS TAKE 1 TABLET EVERY DAY  30 tablet  0  . fenofibrate (TRICOR) 145 MG tablet Take 145 mg by mouth daily.        . Fish Oil OIL Take 1 capsule by mouth daily.        [name] [name] glipiZIDE (GLUCOTROL) 5 MG tablet Take 10 mg by mouth 2 (two) times daily before a meal.       . insulin glargine (LANTUS) 100 UNIT/ML injection Inject 60 Units into the skin at bedtime.      . isosorbide mononitrate (IMDUR) 60 MG 24 hr tablet Take 1 tablet (60 mg total) by mouth daily.  30 tablet  5  . losartan (COZAAR) 50 MG tablet Take 50 mg by mouth daily.        . metFORMIN (GLUCOPHAGE) 500 MG tablet Take 1,000 mg by mouth 2 (two) times daily.        . metoprolol (TOPROL-XL) 200 MG 24 hr tablet Take 100 mg by mouth 2 (two) times daily.        . ramipril (ALTACE) 10 MG capsule Take 10 mg by mouth daily.        . sitaGLIPtin (JANUVIA) 100 MG tablet Take 100 mg by mouth daily.        . Tamsulosin HCl (FLOMAX) 0.4 MG  CAPS Take 0.4 mg by mouth daily.         No current facility-administered medications for this visit.    Past Medical History  Diagnosis Date  . 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 PVD (peripheral vascular disease)     ABIs of 0.69 on right and 0.93 on left  . Hyperlipidemia   . Diabetes mellitus   . Hiatal hernia   . Sleep apnea     Mild  . Renal artery stenosis     Mild, bilateral    Past Surgical History  Procedure Laterality Date  . Soft tissue tumor resection      Vocal cord tumor, abdominal  . Hiatal hernia repair    . Soft tissue tumor resection      Benign neck tumor  . Shoulder arthroscopy      Right  . Cataract extraction    . Vocal cord polyp removal      ROS:  Burping.  Otherwise as stated in the HPI and negative for all other systems.  PHYSICAL EXAM BP 149/84  Pulse 85  Ht 5\' 10"  (1.778 m)  Wt 197 lb (89.359 kg)  BMI 28.27 kg/m2 GENERAL:  Well appearing HEENT:  Pupils equal round and reactive, fundi not visualized, oral mucosa unremarkable, poor dentition NECK:  No jugular venous distention, waveform within normal limits, carotid upstroke brisk and symmetric, no bruits, no thyromegaly LUNGS:  Clear to auscultation bilaterally BACK:  No CVA tenderness CHEST:  Unremarkable HEART:  PMI not displaced or sustained,S1 and S2 within normal limits, no S3, no S4, no clicks, no rubs, no murmurs ABD:  Flat, positive bowel sounds normal in frequency in pitch, no bruits, no rebound, no guarding, no midline pulsatile mass, no hepatomegaly, no splenomegaly EXT:  2 plus pulses  throughout, no edema, no cyanosis no clubbing, absent DP bilateral but PT palpable.   EKG:  Sinus rhythm, rate 93, axis within normal limits, intervals within normal limits poor anterior R-wave progression, inferior and lateral ST depression new since previous EKG.  Probable repolarization. No significant change from previous. 08/20/2012   ASSESSMENT AND PLAN  CAD:  The patient has no new symptoms consistent with his previous angina. We discussed compliance with lifestyle such as diet and exercise to try to improve his dyslipidemia and diabetes. He is really on maximal therapy. No further cardiovascular testing is suggested. Of note he requested a switch him to Plavix for cost. I think he needs to remain on dual antiplatelet therapy. However, he was a nonresponder to Plavix and should remain on Effient.   PVD:  His leg pain is stable. I encouraged more walking. I don't think further ABIs are indicated.  DYSLIPIDEMIA:  He is on target lipid therapy. He will remain on this.

## 2012-08-20 NOTE — Patient Instructions (Addendum)
The current medical regimen is effective;  continue present plan and medications.  Follow up in 6 months with Dr Hochrein.  You will receive a letter in the mail 2 months before you are due.  Please call us when you receive this letter to schedule your follow up appointment.  

## 2012-09-24 ENCOUNTER — Other Ambulatory Visit: Payer: Self-pay | Admitting: *Deleted

## 2012-09-24 MED ORDER — LOSARTAN POTASSIUM 50 MG PO TABS: 50.0000 mg | ORAL_TABLET | Freq: Every day | ORAL | Status: DC

## 2012-09-24 MED ORDER — METOPROLOL SUCCINATE ER 200 MG PO TB24: 100.0000 mg | ORAL_TABLET | Freq: Two times a day (BID) | ORAL | Status: DC

## 2012-09-24 MED ORDER — RAMIPRIL 10 MG PO CAPS: 10.0000 mg | ORAL_CAPSULE | Freq: Every day | ORAL | Status: DC

## 2012-09-24 MED ORDER — ATORVASTATIN CALCIUM 80 MG PO TABS: 80.0000 mg | ORAL_TABLET | Freq: Every day | ORAL | Status: DC

## 2012-09-24 MED ORDER — AMLODIPINE BESYLATE 10 MG PO TABS: 10.0000 mg | ORAL_TABLET | Freq: Every day | ORAL | Status: DC

## 2012-09-24 MED ORDER — ISOSORBIDE MONONITRATE ER 60 MG PO TB24: 60.0000 mg | ORAL_TABLET | Freq: Every day | ORAL | Status: DC

## 2012-09-24 MED ORDER — FENOFIBRATE 145 MG PO TABS: 145.0000 mg | ORAL_TABLET | Freq: Every day | ORAL | Status: DC

## 2012-09-24 NOTE — Progress Notes (Signed)
PT WALKED INTO OFFICE NEEDING REFILLS ON ALL MEDS SENT TO [facility]  COMPLETED

## 2012-11-03 ENCOUNTER — Other Ambulatory Visit: Payer: Self-pay | Admitting: Cardiology

## 2013-10-14 ENCOUNTER — Ambulatory Visit (INDEPENDENT_AMBULATORY_CARE_PROVIDER_SITE_OTHER): Payer: Medicare Other | Admitting: Cardiology

## 2013-10-14 ENCOUNTER — Encounter: Payer: Self-pay | Admitting: Cardiology

## 2013-10-14 VITALS — BP 155/81 | HR 97 | Ht 70.0 in | Wt 194.0 lb

## 2013-10-14 DIAGNOSIS — I2510 Atherosclerotic heart disease of native coronary artery without angina pectoris: Secondary | ICD-10-CM

## 2013-10-14 MED ORDER — FENOFIBRATE 145 MG PO TABS: 145.0000 mg | ORAL_TABLET | Freq: Every day | ORAL | Status: DC

## 2013-10-14 MED ORDER — PRASUGREL HCL 10 MG PO TABS: ORAL_TABLET | ORAL | Status: DC

## 2013-10-14 MED ORDER — NITROGLYCERIN 0.4 MG SL SUBL
0.4000 mg | SUBLINGUAL_TABLET | SUBLINGUAL | Status: DC | PRN
Start: 2013-10-14 — End: 2018-08-27

## 2013-10-14 NOTE — Progress Notes (Signed)
HPI The patient presents for follow up of his CAD.  He has had some arm pain.  He reports that his a few weeks ago. He was having bilateral arm pain similar to previous angina but not as severe. One-day this lasted for 5-6 hours. He did not have any associated symptoms. It was coming on at rest. However, he's not had any discomfort in the last 2 or 3 weeks and he says he's been able to work 2 part-time jobs without bringing on any symptoms. He has never really had chest discomfort. He denies any neck discomfort. He's had no shortness of breath, PND or orthopnea. He's had no palpitations, presyncope or syncope.   He does report leg pain with walking. [name] [name]  However, this is unchanged from previous.  He has had stable mildly abnormal ABIs in the past.  He has had no progression in symptoms since that time.  Allergies  Allergen Reactions  . Penicillins     Current Outpatient Prescriptions  Medication Sig Dispense Refill  . allopurinol (ZYLOPRIM) 100 MG tablet Take 100 mg by mouth daily.        [name] [name] amLODipine (NORVASC) 10 MG tablet Take 1 tablet (10 mg total) by mouth daily.  90 tablet  3  . aspirin 81 MG tablet Take 81 mg by mouth daily.        [name] [name] atorvastatin (LIPITOR) 80 MG tablet Take 1 tablet (80 mg total) by mouth daily.  90 tablet  3  . dexlansoprazole (DEXILANT) 60 MG capsule Take 60 mg by mouth daily.        . Fish Oil OIL Take 1 capsule by mouth daily.        . insulin glargine (LANTUS) 100 UNIT/ML injection Inject 60 Units into the skin at bedtime.      . isosorbide mononitrate (IMDUR) 60 MG 24 hr tablet TAKE 1 TABLET BY MOUTH EVERY DAY  90 tablet  2  . losartan (COZAAR) 50 MG tablet Take 1 tablet (50 mg total) by mouth daily.  90 tablet  3  . metFORMIN (GLUCOPHAGE) 500 MG tablet Take 1,000 mg by mouth 2 (two) times daily.        . metoprolol (TOPROL-XL) 200 MG 24 hr tablet Take 0.5 tablets (100 mg total) by mouth 2 (two) times daily.  90 tablet  3  . ramipril (ALTACE) 10 MG capsule Take 1  capsule (10 mg total) by mouth daily.  90 capsule  3  . fenofibrate (TRICOR) 145 MG tablet Take 1 tablet (145 mg total) by mouth daily.  90 tablet  3  . prasugrel (EFFIENT) 10 MG TABS TAKE 1 TABLET EVERY DAY  90 tablet  3   No current facility-administered medications for this visit.    Past Medical History  Diagnosis Date  . 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 PVD (peripheral vascular disease)     ABIs of 0.69 on right and 0.93 on left  . Hyperlipidemia   . Diabetes mellitus   . Hiatal hernia   . Sleep apnea     Mild  . Renal artery stenosis     Mild, bilateral  Past Surgical History  Procedure Laterality Date  . Soft tissue tumor resection      Vocal cord tumor, abdominal  . Hiatal hernia repair    . Soft tissue tumor resection      Benign neck tumor  . Shoulder arthroscopy      Right  . Cataract extraction    . Vocal cord polyp removal      ROS:  Burping.  Otherwise as stated in the HPI and negative for all other systems.  PHYSICAL EXAM BP 155/81  Pulse 97  Ht 5\' 10"  (1.778 m)  Wt 194 lb (87.998 kg)  BMI 27.84 kg/m2 GENERAL:  Well appearing HEENT:  Pupils equal round and reactive, fundi not visualized, oral mucosa unremarkable, poor dentition NECK:  No jugular venous distention, waveform within normal limits, carotid upstroke brisk and symmetric, no bruits, no thyromegaly LUNGS:  Clear to auscultation bilaterally BACK:  No CVA tenderness CHEST:  Unremarkable HEART:  PMI not displaced or sustained,S1 and S2 within normal limits, no S3, no S4, no clicks, no rubs, no murmurs ABD:   Flat, positive bowel sounds normal in frequency in pitch, no bruits, no rebound, no guarding, no midline pulsatile mass, no hepatomegaly, no splenomegaly EXT:  2 plus pulses upper and reduced femoral, popliteal, DP/PT. , no edema, no cyanosis no clubbing, absent DP bilateral but PT palpable.   EKG:  Sinus rhythm, rate 76, axis within normal limits, intervals within normal limits poor anterior R-wave progression, inferior and lateral ST depression new since previous EKG.  Probable repolarization. No significant change from previous. 10/14/2013   ASSESSMENT AND PLAN  CAD:  The patient did have some arm pain. However, this hasn't happened in couple of weeks. He does not have a stress perfusion study as he does not trust these results given the fact that his not having ongoing pain I don't think that cardiac catheterization is indicated. He has been out of his medications for a little while and I will restart Effient for DAPT.  He was Plavix not on the past. He lost the prescription for sublingual nitroglycerin and at the has any further pain, he promises to call while it is happening.  PVD:  His leg pain is stable. I encouraged more walking. I don't think further ABIs are indicated.  DYSLIPIDEMIA:  He is on target lipid therapy. He will remain on this.  I have refilled prescriptions. He is due to have followup with Josue HectorNYLAND,LEONARD ROBERT, MD

## 2013-10-14 NOTE — Patient Instructions (Addendum)
The current medical regimen is effective;  continue present plan and medications.  Follow up in 6 months with Dr Hochrein.  You will receive a letter in the mail 2 months before you are due.  Please call us when you receive this letter to schedule your follow up appointment.  

## 2013-12-30 ENCOUNTER — Other Ambulatory Visit: Payer: Self-pay | Admitting: *Deleted

## 2013-12-30 MED ORDER — ISOSORBIDE MONONITRATE ER 60 MG PO TB24: ORAL_TABLET | ORAL | Status: DC

## 2014-02-16 ENCOUNTER — Emergency Department (HOSPITAL_COMMUNITY): Payer: Medicare Other

## 2014-02-16 ENCOUNTER — Encounter (HOSPITAL_COMMUNITY): Payer: Self-pay | Admitting: Emergency Medicine

## 2014-02-16 ENCOUNTER — Inpatient Hospital Stay (HOSPITAL_COMMUNITY)
Admission: EM | Admit: 2014-02-16 | Discharge: 2014-02-18 | DRG: 282 | Disposition: A | Discharge status: Home / Self Care | Payer: Medicare Other | Attending: Internal Medicine | Admitting: Internal Medicine

## 2014-02-16 ENCOUNTER — Telehealth: Payer: Self-pay | Admitting: Cardiology

## 2014-02-16 DIAGNOSIS — Z79899 Other long term (current) drug therapy: Secondary | ICD-10-CM | POA: Diagnosis not present

## 2014-02-16 DIAGNOSIS — I2582 Chronic total occlusion of coronary artery: Secondary | ICD-10-CM | POA: Diagnosis present

## 2014-02-16 DIAGNOSIS — I2510 Atherosclerotic heart disease of native coronary artery without angina pectoris: Secondary | ICD-10-CM | POA: Diagnosis present

## 2014-02-16 DIAGNOSIS — I10 Essential (primary) hypertension: Secondary | ICD-10-CM | POA: Diagnosis present

## 2014-02-16 DIAGNOSIS — I739 Peripheral vascular disease, unspecified: Secondary | ICD-10-CM | POA: Diagnosis present

## 2014-02-16 DIAGNOSIS — Z794 Long term (current) use of insulin: Secondary | ICD-10-CM | POA: Diagnosis not present

## 2014-02-16 DIAGNOSIS — Z7982 Long term (current) use of aspirin: Secondary | ICD-10-CM

## 2014-02-16 DIAGNOSIS — G4733 Obstructive sleep apnea (adult) (pediatric): Secondary | ICD-10-CM | POA: Diagnosis present

## 2014-02-16 DIAGNOSIS — Z9861 Coronary angioplasty status: Secondary | ICD-10-CM | POA: Diagnosis not present

## 2014-02-16 DIAGNOSIS — Z880 Allergy status to penicillin: Secondary | ICD-10-CM

## 2014-02-16 DIAGNOSIS — I214 Non-ST elevation (NSTEMI) myocardial infarction: Principal | ICD-10-CM | POA: Diagnosis present

## 2014-02-16 DIAGNOSIS — E785 Hyperlipidemia, unspecified: Secondary | ICD-10-CM | POA: Diagnosis present

## 2014-02-16 DIAGNOSIS — I701 Atherosclerosis of renal artery: Secondary | ICD-10-CM | POA: Diagnosis present

## 2014-02-16 DIAGNOSIS — E876 Hypokalemia: Secondary | ICD-10-CM | POA: Diagnosis present

## 2014-02-16 DIAGNOSIS — R079 Chest pain, unspecified: Secondary | ICD-10-CM | POA: Diagnosis present

## 2014-02-16 DIAGNOSIS — Z8249 Family history of ischemic heart disease and other diseases of the circulatory system: Secondary | ICD-10-CM

## 2014-02-16 DIAGNOSIS — Z7902 Long term (current) use of antithrombotics/antiplatelets: Secondary | ICD-10-CM | POA: Diagnosis not present

## 2014-02-16 DIAGNOSIS — E119 Type 2 diabetes mellitus without complications: Secondary | ICD-10-CM | POA: Diagnosis present

## 2014-02-16 DIAGNOSIS — Z87891 Personal history of nicotine dependence: Secondary | ICD-10-CM

## 2014-02-16 DIAGNOSIS — I959 Hypotension, unspecified: Secondary | ICD-10-CM | POA: Diagnosis present

## 2014-02-16 DIAGNOSIS — I200 Unstable angina: Secondary | ICD-10-CM

## 2014-02-16 HISTORY — DX: Other ill-defined heart diseases: I51.89

## 2014-02-16 LAB — GLUCOSE, CAPILLARY
Glucose-Capillary: 137 mg/dL — ABNORMAL HIGH (ref 70–99)
Glucose-Capillary: 176 mg/dL — ABNORMAL HIGH (ref 70–99)

## 2014-02-16 LAB — I-STAT CHEM 8, ED
BUN: 12 mg/dL (ref 6–23)
CALCIUM ION: 1.11 mmol/L — AB (ref 1.13–1.30)
CHLORIDE: 108 meq/L (ref 96–112)
CREATININE: 0.5 mg/dL (ref 0.50–1.35)
Glucose, Bld: 179 mg/dL — ABNORMAL HIGH (ref 70–99)
HCT: 45 % (ref 39.0–52.0)
Hemoglobin: 15.3 g/dL (ref 13.0–17.0)
POTASSIUM: 3.6 meq/L — AB (ref 3.7–5.3)
Sodium: 142 mEq/L (ref 137–147)
TCO2: 23 mmol/L (ref 0–100)

## 2014-02-16 LAB — CBC WITH DIFFERENTIAL/PLATELET
Basophils Absolute: 0 10*3/uL (ref 0.0–0.1)
Basophils Relative: 0 % (ref 0–1)
EOS ABS: 0.1 10*3/uL (ref 0.0–0.7)
EOS PCT: 1 % (ref 0–5)
HCT: 43.4 % (ref 39.0–52.0)
HEMOGLOBIN: 15.1 g/dL (ref 13.0–17.0)
Lymphocytes Relative: 26 % (ref 12–46)
Lymphs Abs: 2.7 10*3/uL (ref 0.7–4.0)
MCH: 28.9 pg (ref 26.0–34.0)
MCHC: 34.8 g/dL (ref 30.0–36.0)
MCV: 83 fL (ref 78.0–100.0)
MONOS PCT: 6 % (ref 3–12)
Monocytes Absolute: 0.6 10*3/uL (ref 0.1–1.0)
Neutro Abs: 7.2 10*3/uL (ref 1.7–7.7)
Neutrophils Relative %: 67 % (ref 43–77)
Platelets: 182 10*3/uL (ref 150–400)
RBC: 5.23 MIL/uL (ref 4.22–5.81)
RDW: 13.6 % (ref 11.5–15.5)
WBC: 10.6 10*3/uL — ABNORMAL HIGH (ref 4.0–10.5)

## 2014-02-16 LAB — TROPONIN I
Troponin I: 2.72 ng/mL (ref <0.30)
Troponin I: 7.46 ng/mL (ref <0.30)

## 2014-02-16 LAB — I-STAT TROPONIN, ED: Troponin i, poc: 0.84 ng/mL (ref 0.00–0.08)

## 2014-02-16 MED ORDER — NITROGLYCERIN 0.4 MG SL SUBL: 0.4000 mg | SUBLINGUAL_TABLET | SUBLINGUAL | Status: DC | PRN

## 2014-02-16 MED ORDER — ASPIRIN 81 MG PO CHEW
81.0000 mg | CHEWABLE_TABLET | ORAL | Status: AC
  Administered 2014-02-17: 08:00:00 81 mg via ORAL
  Filled 2014-02-16: qty 1

## 2014-02-16 MED ORDER — PANTOPRAZOLE SODIUM 40 MG PO TBEC
40.0000 mg | DELAYED_RELEASE_TABLET | Freq: Every day | ORAL | Status: DC
  Administered 2014-02-17 – 2014-02-18 (×2): 40 mg via ORAL
  Filled 2014-02-16 (×2): qty 1

## 2014-02-16 MED ORDER — ALLOPURINOL 100 MG PO TABS
100.0000 mg | ORAL_TABLET | Freq: Every day | ORAL | Status: DC
  Administered 2014-02-17 – 2014-02-18 (×2): 100 mg via ORAL
  Filled 2014-02-16 (×2): qty 1

## 2014-02-16 MED ORDER — SODIUM CHLORIDE 0.9 % IV SOLN
1.0000 mL/kg/h | INTRAVENOUS | Status: DC
  Administered 2014-02-17: 1 mL/kg/h via INTRAVENOUS

## 2014-02-16 MED ORDER — INSULIN ASPART 100 UNIT/ML ~~LOC~~ SOLN: 10.0000 [IU] | Freq: Three times a day (TID) | SUBCUTANEOUS | Status: DC

## 2014-02-16 MED ORDER — INSULIN ASPART 100 UNIT/ML ~~LOC~~ SOLN
0.0000 [IU] | Freq: Every day | SUBCUTANEOUS | Status: DC
  Administered 2014-02-17: 2 [IU] via SUBCUTANEOUS

## 2014-02-16 MED ORDER — SODIUM CHLORIDE 0.9 % IJ SOLN
3.0000 mL | Freq: Two times a day (BID) | INTRAMUSCULAR | Status: DC
  Administered 2014-02-16 – 2014-02-17 (×2): 3 mL via INTRAVENOUS

## 2014-02-16 MED ORDER — METOPROLOL SUCCINATE ER 100 MG PO TB24
100.0000 mg | ORAL_TABLET | Freq: Two times a day (BID) | ORAL | Status: DC
  Administered 2014-02-16 – 2014-02-18 (×3): 100 mg via ORAL
  Filled 2014-02-16 (×5): qty 1

## 2014-02-16 MED ORDER — ONDANSETRON HCL 4 MG/2ML IJ SOLN: 4.0000 mg | Freq: Four times a day (QID) | INTRAMUSCULAR | Status: DC | PRN

## 2014-02-16 MED ORDER — ATORVASTATIN CALCIUM 80 MG PO TABS
80.0000 mg | ORAL_TABLET | Freq: Every day | ORAL | Status: DC
  Administered 2014-02-17: 80 mg via ORAL
  Filled 2014-02-16 (×2): qty 1

## 2014-02-16 MED ORDER — NITROGLYCERIN IN D5W 200-5 MCG/ML-% IV SOLN
10.0000 ug/min | INTRAVENOUS | Status: DC
  Administered 2014-02-16: 10 ug/min via INTRAVENOUS
  Filled 2014-02-16: qty 250

## 2014-02-16 MED ORDER — RAMIPRIL 10 MG PO CAPS: 10.0000 mg | ORAL_CAPSULE | Freq: Every day | ORAL | Status: DC

## 2014-02-16 MED ORDER — RAMIPRIL 10 MG PO CAPS
10.0000 mg | ORAL_CAPSULE | Freq: Every day | ORAL | Status: DC
  Administered 2014-02-17 – 2014-02-18 (×2): 10 mg via ORAL
  Filled 2014-02-16 (×2): qty 1

## 2014-02-16 MED ORDER — ASPIRIN EC 81 MG PO TBEC
81.0000 mg | DELAYED_RELEASE_TABLET | Freq: Every day | ORAL | Status: DC
  Administered 2014-02-17 – 2014-02-18 (×2): 81 mg via ORAL
  Filled 2014-02-16 (×2): qty 1

## 2014-02-16 MED ORDER — HEPARIN (PORCINE) IN NACL 100-0.45 UNIT/ML-% IJ SOLN
1500.0000 [IU]/h | INTRAMUSCULAR | Status: DC
  Administered 2014-02-16: 1100 [IU]/h via INTRAVENOUS
  Administered 2014-02-17: 1500 [IU]/h via INTRAVENOUS
  Administered 2014-02-17: 1400 [IU]/h via INTRAVENOUS
  Filled 2014-02-16 (×4): qty 250

## 2014-02-16 MED ORDER — HEPARIN BOLUS VIA INFUSION
4000.0000 [IU] | Freq: Once | INTRAVENOUS | Status: AC
  Administered 2014-02-16: 4000 [IU] via INTRAVENOUS
  Filled 2014-02-16: qty 4000

## 2014-02-16 MED ORDER — ISOSORBIDE MONONITRATE ER 60 MG PO TB24
60.0000 mg | ORAL_TABLET | Freq: Every day | ORAL | Status: DC
  Administered 2014-02-18: 10:00:00 60 mg via ORAL
  Filled 2014-02-16 (×3): qty 1

## 2014-02-16 MED ORDER — ACETAMINOPHEN 325 MG PO TABS
650.0000 mg | ORAL_TABLET | ORAL | Status: DC | PRN
Start: 2014-02-16 — End: 2014-02-18

## 2014-02-16 MED ORDER — NITROGLYCERIN IN D5W 200-5 MCG/ML-% IV SOLN: 3.0000 ug/min | INTRAVENOUS | Status: DC

## 2014-02-16 MED ORDER — AMLODIPINE BESYLATE 10 MG PO TABS
10.0000 mg | ORAL_TABLET | Freq: Every day | ORAL | Status: DC
  Administered 2014-02-17 – 2014-02-18 (×2): 10 mg via ORAL
  Filled 2014-02-16 (×2): qty 1

## 2014-02-16 MED ORDER — SODIUM CHLORIDE 0.9 % IV SOLN: 250.0000 mL | INTRAVENOUS | Status: DC | PRN

## 2014-02-16 MED ORDER — LOSARTAN POTASSIUM 50 MG PO TABS
50.0000 mg | ORAL_TABLET | Freq: Every day | ORAL | Status: DC
  Administered 2014-02-17: 11:00:00 50 mg via ORAL
  Filled 2014-02-16 (×2): qty 1

## 2014-02-16 MED ORDER — INSULIN ASPART 100 UNIT/ML ~~LOC~~ SOLN
0.0000 [IU] | Freq: Three times a day (TID) | SUBCUTANEOUS | Status: DC
  Administered 2014-02-17 – 2014-02-18 (×3): 3 [IU] via SUBCUTANEOUS

## 2014-02-16 MED ORDER — ASPIRIN 81 MG PO CHEW: 324.0000 mg | CHEWABLE_TABLET | ORAL | Status: AC

## 2014-02-16 MED ORDER — SODIUM CHLORIDE 0.9 % IJ SOLN: 3.0000 mL | INTRAMUSCULAR | Status: DC | PRN

## 2014-02-16 MED ORDER — FENOFIBRATE 160 MG PO TABS
160.0000 mg | ORAL_TABLET | Freq: Every day | ORAL | Status: DC
  Administered 2014-02-17 – 2014-02-18 (×2): 160 mg via ORAL
  Filled 2014-02-16 (×2): qty 1

## 2014-02-16 MED ORDER — ASPIRIN 300 MG RE SUPP
300.0000 mg | RECTAL | Status: AC
  Filled 2014-02-16: qty 1

## 2014-02-16 NOTE — Telephone Encounter (Signed)
Message forwarded to Dr. Antoine Poche.

## 2014-02-16 NOTE — ED Notes (Signed)
Called no answer

## 2014-02-16 NOTE — Telephone Encounter (Signed)
Courtney(nurse) called in wanting to notify the Doctor that the pt was admitted into the hospital today for chest pain

## 2014-02-16 NOTE — ED Notes (Signed)
Pt states that chest pain startes around 1030 this morning, states pain is on both sides of chest and radiates to both arm, denies N/V, complains of SOB earlier today. Alert and oriented x 4, speaks in full sentences. C/o pain 4 out of 10 now.

## 2014-02-16 NOTE — ED Notes (Signed)
Attempted to call report to floor 

## 2014-02-16 NOTE — Progress Notes (Signed)
ANTICOAGULATION CONSULT NOTE - Initial Consult  Pharmacy Consult for heparin Indication: chest pain/ACS  Allergies  Allergen Reactions  . Penicillins     Patient Measurements: Height: 5\' 11"  (180.3 cm) Weight: 194 lb (87.998 kg) IBW/kg (Calculated) : 75.3 Heparin Dosing Weight: 88kg  Vital Signs: Temp: 97.8 F (36.6 C) (09/01 1450) Temp src: Oral (09/01 1450) BP: 132/67 mmHg (09/01 1700) Pulse Rate: 80 (09/01 1700)  Labs:  Recent Labs  02/16/14 1543 02/16/14 1601  HGB 15.1 15.3  HCT 43.4 45.0  PLT 182  --   CREATININE  --  0.50  TROPONINI 2.72*  --     Estimated Creatinine Clearance: 96.7 ml/min (by C-G formula based on Cr of 0.5).   Medical History: Past Medical History  Diagnosis Date  . 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 PVD (peripheral vascular disease)     ABIs of 0.69 on right and 0.93 on left  . Hyperlipidemia   . Diabetes mellitus   . Hiatal hernia   . Sleep apnea     Mild  . Renal artery stenosis     Mild, bilateral    Medications:  Infusions:  . heparin    . heparin    . nitroGLYCERIN 10 mcg/min (02/16/14 1600)    Assessment: 68 yom presented to the ED with CP. To start IV heparin for anticoagulation. Baseline CBC is WNL and he is not on any anticoagulation PTA. Cardiology is planning on cardiac cath.   Goal of Therapy:  Heparin level 0.3-0.7 units/ml Monitor platelets by anticoagulation protocol: Yes   Plan:  1. Heparin bolus 4000 units IV x 1 2. Heparin gtt 1100 units/hr 3. Check a 6 hour heparin level 4. Daily heparin level and CBC    Abayomi Pattison, Drake Leach 02/16/2014,5:16 PM

## 2014-02-16 NOTE — ED Notes (Signed)
Pt in from Dr. Vernell Barrier office via [facility], pt c/o non radiating bil upper chest & radiating into bil arms, pt denies SOB, n/v/d onset today @ 10:30 today, pt received 324 mg ASA, x3 0.4 mg SL nitro PTA, pain 6/10-4/10 pt hx of stent placement, A&O x4, follows commands, speaks in complete sentences

## 2014-02-16 NOTE — ED Provider Notes (Signed)
CSN: [id number]     Arrival date & time 02/16/14  1438 History   First MD Initiated Contact with Patient 02/16/14 1527     Chief Complaint  Patient presents with  . Chest Pain     (Consider location/radiation/quality/duration/timing/severity/associated sxs/prior Treatment) HPI Complains of bilateral arm pain and bilateral chest pain described as tightness onset 10:30 AM today while at work. He's had similar pain almost every day for the past few weeks. Patient states his angina manifests itself as bilateral arm pain. He's never had chest pain before however. He presents today as he developed chest pain this morning. He was seen at Dr.Nyland's office, cardiology was called patient was transported here. In the field he received sublingual nitroglycerin x3 tablets and 4 baby aspirin's with partial relief. Presently discomfort is mild. He admits to mild shortness of breath, denies nausea or sweatiness no other associated symptoms. Pain was improved with nitroglycerin. Not made worse by anything. Onset at rest. Past Medical History  Diagnosis Date  . 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 PVD (peripheral vascular disease)     ABIs of 0.69 on right and 0.93 on left  . Hyperlipidemia   . Diabetes mellitus   . Hiatal hernia   . Sleep apnea     Mild  . Renal artery stenosis     Mild, bilateral   Past Surgical History  Procedure Laterality Date  . Soft tissue tumor resection      Vocal cord tumor, abdominal  . Hiatal hernia repair    . Soft tissue tumor resection      Benign neck tumor  . Shoulder arthroscopy       Right  . Cataract extraction    . Vocal cord polyp removal     Family History  Problem Relation Age of Onset  . Heart attack Father 51   History  Substance Use Topics  . Smoking status: Former Smoker    Quit date: 06/19/2003  . Smokeless tobacco: Not on file  . Alcohol Use: No    Review of Systems  Constitutional: Negative.   HENT: Negative.   Respiratory: Positive for shortness of breath.   Cardiovascular: Positive for chest pain.  Gastrointestinal: Negative.   Musculoskeletal: Negative.   Skin: Negative.   Neurological: Negative.   Psychiatric/Behavioral: Negative.   All other systems reviewed and are negative.     Allergies  Penicillins  Home Medications   Prior to Admission medications   Medication Sig Start Date End Date Taking? Authorizing Provider  allopurinol (ZYLOPRIM) 100 MG tablet Take 100 mg by mouth daily.      Historical Provider, MD  amLODipine (NORVASC) 10 MG tablet Take 1 tablet (10 mg total) by mouth daily. 09/24/12   Rollene Rotunda, MD  aspirin 81 MG tablet Take 81 mg by mouth daily.      Historical Provider, MD  atorvastatin (LIPITOR) 80 MG tablet Take 1 tablet (80 mg total) by mouth daily. 09/24/12   Rollene Rotunda, MD  dexlansoprazole (DEXILANT) 60 MG capsule Take 60 mg by mouth daily.      Historical Provider, MD  fenofibrate (TRICOR) 145 MG tablet Take 1 tablet (  145 mg total) by mouth daily. 10/14/13   Rollene Rotunda, MD  Fish Oil OIL Take 1 capsule by mouth daily.      Historical Provider, MD  insulin glargine (LANTUS) 100 UNIT/ML injection Inject 60 Units into the skin at bedtime.    Historical Provider, MD  isosorbide mononitrate (IMDUR) 60 MG 24 hr tablet TAKE 1 TABLET BY MOUTH EVERY DAY 12/30/13   Rollene Rotunda, MD  losartan (COZAAR) 50 MG tablet Take 1 tablet (50 mg total) by mouth daily. 09/24/12   Rollene Rotunda, MD  metFORMIN (GLUCOPHAGE) 500 MG tablet Take 1,000 mg by mouth 2 (two) times daily.      Historical Provider, MD  metoprolol  (TOPROL-XL) 200 MG 24 hr tablet Take 0.5 tablets (100 mg total) by mouth 2 (two) times daily. 09/24/12   Rollene Rotunda, MD  nitroGLYCERIN (NITROSTAT) 0.4 MG SL tablet Place 1 tablet (0.4 mg total) under the tongue every 5 (five) minutes as needed for chest pain. 10/14/13   Rollene Rotunda, MD  prasugrel (EFFIENT) 10 MG TABS tablet TAKE 1 TABLET EVERY DAY 10/14/13   Rollene Rotunda, MD  ramipril (ALTACE) 10 MG capsule Take 1 capsule (10 mg total) by mouth daily. 09/24/12   Rollene Rotunda, MD   BP 138/78  Pulse 80  Temp(Src) 97.8 F (36.6 C) (Oral)  Resp 15  Ht  (1.803 m)  Wt 194 lb (87.998 kg)  BMI 27.07 kg/m2  SpO2 94% Physical Exam  Nursing note and vitals reviewed. Constitutional: He appears well-developed and well-nourished.  HENT:  Head: Normocephalic and atraumatic.  Eyes: Conjunctivae are normal. Pupils are equal, round, and reactive to light.  Neck: Neck supple. No tracheal deviation present. No thyromegaly present.  Cardiovascular: Normal rate and regular rhythm.   No murmur heard. Pulmonary/Chest: Effort normal and breath sounds normal.  Abdominal: Soft. Bowel sounds are normal. He exhibits no distension. There is no tenderness.  Musculoskeletal: Normal range of motion. He exhibits no edema and no tenderness.  Neurological: He is alert. Coordination normal.  Skin: Skin is warm and dry. No rash noted.  Psychiatric: He has a normal mood and affect.    ED Course  Procedures (including critical care time) Labs Review Labs Reviewed - No data to display  Imaging Review No results found.   EKG Interpretation   Date/Time:  Tuesday February 16 2014 14:47:08 EDT Ventricular Rate:  85 PR Interval:  168 QRS Duration: 83 QT Interval:  424 QTC Calculation: 504 R Axis:   55 Text Interpretation:  Sinus rhythm Probable left atrial enlargement  Borderline repolarization abnormality Prolonged QT interval Baseline  wander in lead(s) I II aVR No significant change since last  tracing  Confirmed by Ethelda Chick  MD, Drakkar Medeiros [phone]) on 02/16/2014 3:06:27 PM     chest x-ray viewed by me. Results for orders placed during the hospital encounter of 02/16/14  CBC WITH DIFFERENTIAL      Result Value Ref Range   WBC 10.6 (*) 4.0 - 10.5 K/uL   RBC 5.23  4.22 - 5.81 MIL/uL   Hemoglobin 15.1  13.0 - 17.0 g/dL   HCT 19.1  47.8 - 29.5 %   MCV 83.0  78.0 - 100.0 fL   MCH 28.9  26.0 - 34.0 pg   MCHC 34.8  30.0 - 36.0 g/dL   RDW 62.1  30.8 - 65.7 %   Platelets 182  150 - 400 K/uL   Neutrophils Relative % 67  43 - 77 %  Neutro Abs 7.2  1.7 - 7.7 K/uL   Lymphocytes Relative 26  12 - 46 %   Lymphs Abs 2.7  0.7 - 4.0 K/uL   Monocytes Relative 6  3 - 12 %   Monocytes Absolute 0.6  0.1 - 1.0 K/uL   Eosinophils Relative 1  0 - 5 %   Eosinophils Absolute 0.1  0.0 - 0.7 K/uL   Basophils Relative 0  0 - 1 %   Basophils Absolute 0.0  0.0 - 0.1 K/uL  I-STAT CHEM 8, ED      Result Value Ref Range   Sodium 142  137 - 147 mEq/L   Potassium 3.6 (*) 3.7 - 5.3 mEq/L   Chloride 108  96 - 112 mEq/L   BUN 12  6 - 23 mg/dL   Creatinine, Ser 4.09  0.50 - 1.35 mg/dL   Glucose, Bld 811 (*) 70 - 99 mg/dL   Calcium, Ion 9.14 (*) 1.13 - 1.30 mmol/L   TCO2 23  0 - 100 mmol/L   Hemoglobin 15.3  13.0 - 17.0 g/dL   HCT 78.2  95.6 - 21.3 %   Results for orders placed during the hospital encounter of 02/16/14  CBC WITH DIFFERENTIAL      Result Value Ref Range   WBC 10.6 (*) 4.0 - 10.5 K/uL   RBC 5.23  4.22 - 5.81 MIL/uL   Hemoglobin 15.1  13.0 - 17.0 g/dL   HCT 08.6  57.8 - 46.9 %   MCV 83.0  78.0 - 100.0 fL   MCH 28.9  26.0 - 34.0 pg   MCHC 34.8  30.0 - 36.0 g/dL   RDW 62.9  52.8 - 41.3 %   Platelets 182  150 - 400 K/uL   Neutrophils Relative % 67  43 - 77 %   Neutro Abs 7.2  1.7 - 7.7 K/uL   Lymphocytes Relative 26  12 - 46 %   Lymphs Abs 2.7  0.7 - 4.0 K/uL   Monocytes Relative 6  3 - 12 %   Monocytes Absolute 0.6  0.1 - 1.0 K/uL   Eosinophils Relative 1  0 - 5 %   Eosinophils  Absolute 0.1  0.0 - 0.7 K/uL   Basophils Relative 0  0 - 1 %   Basophils Absolute 0.0  0.0 - 0.1 K/uL  TROPONIN I      Result Value Ref Range   Troponin I 2.72 (*) <0.30 ng/mL  I-STAT CHEM 8, ED      Result Value Ref Range   Sodium 142  137 - 147 mEq/L   Potassium 3.6 (*) 3.7 - 5.3 mEq/L   Chloride 108  96 - 112 mEq/L   BUN 12  6 - 23 mg/dL   Creatinine, Ser 2.44  0.50 - 1.35 mg/dL   Glucose, Bld 010 (*) 70 - 99 mg/dL   Calcium, Ion 2.72 (*) 1.13 - 1.30 mmol/L   TCO2 23  0 - 100 mmol/L   Hemoglobin 15.3  13.0 - 17.0 g/dL   HCT 53.6  64.4 - 03.4 %  I-STAT TROPOININ, ED      Result Value Ref Range   Troponin i, poc 0.84 (*) 0.00 - 0.08 ng/mL   Comment NOTIFIED PHYSICIAN     Comment 3            Dg Chest Portable 1 View  02/16/2014   CLINICAL DATA:  Chest pain.  EXAM: PORTABLE CHEST - 1 VIEW  COMPARISON:  September 20, 2004.  FINDINGS: The heart size and mediastinal contours are within normal limits. Both lungs are clear. No pneumothorax or pleural effusion is noted. The visualized skeletal structures are unremarkable.  IMPRESSION: No acute cardiopulmonary abnormality seen.   Electronically Signed   By: Roque Lias M.D.   On: 02/16/2014 15:59    Dg Chest Portable 1 View  02/16/2014   CLINICAL DATA:  Chest pain.  EXAM: PORTABLE CHEST - 1 VIEW  COMPARISON:  September 20, 2004.  FINDINGS: The heart size and mediastinal contours are within normal limits. Both lungs are clear. No pneumothorax or pleural effusion is noted. The visualized skeletal structures are unremarkable.  IMPRESSION: No acute cardiopulmonary abnormality seen.   Electronically Signed   By: Roque Lias M.D.   On: 02/16/2014 15:59    4:25 PM patient asymptomatic after treatment with nitroglycerin intravenous drip MDM  Cardiology consult to evaluate patient for admission. Pt to be admitted to step down unit Final diagnoses:  None   Dx #1 NSTEMI #2hyperglycermia CRITICAL CARE Performed by: Doug Sou Total critical care  time: 30 minute Critical care time was exclusive of separately billable procedures and treating other patients. Critical care was necessary to treat or prevent imminent or life-threatening deterioration. Critical care was time spent personally by me on the following activities: development of treatment plan with patient and/or surrogate as well as nursing, discussions with consultants, evaluation of patient's response to treatment, examination of patient, obtaining history from patient or surrogate, ordering and performing treatments and interventions, ordering and review of laboratory studies, ordering and review of radiographic studies, pulse oximetry and re-evaluation of patient's condition.     Doug Sou, MD 02/16/14 1704

## 2014-02-16 NOTE — Telephone Encounter (Signed)
Dr. Lysbeth Galas and Dr. Antoine Poche - patient to be admitted to [facility] - [facility] aware

## 2014-02-16 NOTE — H&P (Addendum)
Patient ID: [name] [name] MRN: [id number], DOB/AGE: [id number]   Admit date: 02/16/2014   Primary Physician: [name] Hector, MD Primary Cardiologist: Dr. Virgina [name]   Pt. Profile:  [name] [name] is a 68 y.o. male with a history of DM, HLD, PVD, OSA, mild bilateral renal artery stenosis, and CAD s/p previous stenting in RCA and LAD; s/p overlapping DESx2 (2011) who presented to [facility] ED today with chest pain.   He was seen in the office for regular cardiology follow up on 10/14/13 and reported having bilateral arm pain similar to previous angina but not as severe. He declined a nuclear stress test at that time and was restarted on Effient as he had run out of his plavix and stopped taking it.   This am developed severe bilateral arm pain radiating to his chest while working at [employer]. [place] it felt very similar to previous angina. + flushing. No nausea or diaphoresis.  Unlike recent episodes, this time pain persisted. Went home and took a nap and woke up and pain still there. Came to ER. Pain relieved promptly with IV NTG. ECG with nonspecific ST scooping. Troponin pending.    Problem List  Past Medical History  Diagnosis Date  . 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 PVD (peripheral vascular disease)     ABIs of 0.69 on right and 0.93 on left  . Hyperlipidemia   . Diabetes mellitus   . Hiatal hernia   . Sleep apnea     Mild  . Renal artery stenosis     Mild, bilateral    Past Surgical History  Procedure Laterality Date  . Soft tissue tumor resection      Vocal cord tumor, abdominal  .  Hiatal hernia repair    . Soft tissue tumor resection      Benign neck tumor  . Shoulder arthroscopy      Right  . Cataract extraction    . Vocal cord polyp removal       Allergies  Allergies  Allergen Reactions  . Penicillins      Home Medications  Prior to Admission medications   Medication Sig Start Date End Date Taking? Authorizing Provider  allopurinol (ZYLOPRIM) 100 MG tablet Take 100 mg by mouth daily.      Historical Provider, MD  amLODipine (NORVASC) 10 MG tablet Take 1 tablet (10 mg total) by mouth daily. 09/24/12   Rollene Rotunda, MD  aspirin 81 MG tablet Take 81 mg by mouth daily.      Historical Provider, MD  atorvastatin (LIPITOR) 80 MG tablet Take 1 tablet (80 mg total) by mouth daily. 09/24/12   Rollene Rotunda, MD  dexlansoprazole (DEXILANT) 60 MG capsule Take 60 mg by mouth daily.      Historical Provider, MD  fenofibrate (TRICOR) 145 MG tablet Take 1 tablet (145 mg total) by mouth daily. 10/14/13   Rollene Rotunda, MD  Fish Oil OIL Take 1 capsule by mouth daily.      Historical Provider, MD  insulin glargine (LANTUS) 100 UNIT/ML injection Inject 60 Units into the skin at bedtime.    Historical Provider, MD  isosorbide mononitrate (IMDUR) 60  MG 24 hr tablet TAKE 1 TABLET BY MOUTH EVERY DAY 12/30/13   Rollene Rotunda, MD  losartan (COZAAR) 50 MG tablet Take 1 tablet (50 mg total) by mouth daily. 09/24/12   Rollene Rotunda, MD  metFORMIN (GLUCOPHAGE) 500 MG tablet Take 1,000 mg by mouth 2 (two) times daily.      Historical Provider, MD  metoprolol (TOPROL-XL) 200 MG 24 hr tablet Take 0.5 tablets (100 mg total) by mouth 2 (two) times daily. 09/24/12   Rollene Rotunda, MD  nitroGLYCERIN (NITROSTAT) 0.4 MG SL tablet Place 1 tablet (0.4 mg total) under the tongue every 5 (five) minutes as needed for chest pain. 10/14/13   Rollene Rotunda, MD  prasugrel (EFFIENT) 10 MG TABS tablet TAKE 1 TABLET EVERY DAY 10/14/13   Rollene Rotunda, MD  ramipril (ALTACE) 10 MG capsule Take 1 capsule (10 mg  total) by mouth daily. 09/24/12   Rollene Rotunda, MD    Family History  Family History  Problem Relation Age of Onset  . Heart attack Father 55   No family status information on file.     Social History  History   Social History  . Marital Status: Married    Spouse Name: N/A    Number of Children: 3  . Years of Education: N/A   Occupational History  . PACKAGING     [name] [name]   Social History Main Topics  . Smoking status: Former Smoker    Quit date: 06/19/2003  . Smokeless tobacco: Not on file  . Alcohol Use: No  . Drug Use: Not on file  . Sexual Activity: Not on file   Other Topics Concern  . Not on file   Social History Narrative   Lives in [place] with his wife.     Review of Systems General:  No chills, fever, night sweats or weight changes.  Cardiovascular:  +chest pain & dyspnea on exertion. No edema, orthopnea, palpitations, paroxysmal nocturnal dyspnea. Dermatological: No rash, lesions/masses Respiratory: No cough, dyspnea Urologic: No hematuria, dysuria Abdominal:   No nausea, vomiting, diarrhea, bright red blood per rectum, melena, or hematemesis Neurologic:  No visual changes, wkns, changes in mental status. + arthritis All other systems reviewed and are otherwise negative except as noted above.  Physical Exam  Blood pressure 138/78, pulse 80, temperature 97.8 F (36.6 C), temperature source Oral, resp. rate 15, height  (1.803 m), weight 194 lb (87.998 kg), SpO2 94.00%.  General: Pleasant, NAD Psych: Normal affect. Neuro: Alert and oriented X 3. Moves all extremities spontaneously. HEENT: Normal  Neck: Supple without bruits or JVD. Lungs:  RClear decreased bs throughout Heart: RRR no s3, s4, or murmurs. Abdomen: Soft, non-tender, non-distended, BS + x 4.  Extremities: No clubbing, cyanosis or edema.  Labs  No results found for this basename: CKTOTAL, CKMB, TROPONINI,  in the last 72 hours Lab Results  Component Value Date    WBC 7.6 06/06/2010   HGB 15.3 02/16/2014   HCT 45.0 02/16/2014   MCV 84.2 06/06/2010   PLT 167 06/06/2010    Recent Labs Lab 02/16/14 1601  NA 142  K 3.6*  CL 108  BUN 12  CREATININE 0.50  GLUCOSE 179*    Radiology/Studies  Dg Chest Portable 1 View  02/16/2014   CLINICAL DATA:  Chest pain.  EXAM: PORTABLE CHEST - 1 VIEW  COMPARISON:  September 20, 2004.  FINDINGS: The heart size and mediastinal contours are within normal limits. Both lungs are clear. No pneumothorax or pleural  effusion is noted. The visualized skeletal structures are unremarkable.  IMPRESSION: No acute cardiopulmonary abnormality seen.   Electronically Signed   By: Roque Lias M.D.   On: 02/16/2014 15:59    DATE OF DISCHARGE:  06/01/2010                          CARDIAC CATHETERIZATION  PRIMARY CARE PHYSICIAN:  Delaney Meigs, MD CARDIOLOGIST:  Rollene Rotunda, MD, [facility] PROCEDURE:  Left heart catheterization/coronary arteriography. INDICATIONS:  Evaluate the patient with arm discomfort.  He had had previous stenting to his right coronary and LAD.  A stress perfusion study demonstrated no ischemia, but there was some chest discomfort. PROCEDURE NOTE:  Left heart catheterization was performed via the right femoral artery.  The artery was cannulated using anterior wall puncture. A #4-French arterial sheath was inserted via the Seldinger technique. Preformed Judkins and pigtail catheter were utilized.  The patient tolerated the procedure well and left the lab in stable condition.  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  I reached this with an Amplatz right 1 catheter. RESULTS:  Hemodynamics:  LV 134/17, AO 128/68. Coronaries:  Left main was nonexistent.  The LAD had proximal calcification.  There was a long 25% stenosis.  There was a mid long 50% stenosis with heavy calcification.  There was a mid stent which was widely patent.  There was a significant drop-off in the vessel diameter after this  stent in the distal and apical vessel but no high-grade lesions.  First diagonal was moderate sized with long 60% stenosis. Second and third diagonals were moderate sized and normal.  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  There was diffuse 25-30% proximal stenosis and distal 50% stenosis before the posterolateral.  Right coronary artery was dominant.  There was a large mid stent proximal 40% stenosis within the stent.  There was 40% stenosis in the distal vessel followed by a 90% lesion before the PDA. PDA was moderate sized in length, but not particularly wide caliber. There was proximal 70-75% stenosis. Left ventriculogram:  The left ventriculogram was obtained in the RAO projection.  The EF was 65% with normal wall motion. Aortogram:  Distal aortogram was obtained secondary to some difficulty advancing the guidewire.  There was mild diffuse plaque.  It was greater than the right and left iliac, but there were no high-grade focal lesions. CONCLUSION:  Nonobstructive three-vessel coronary artery disease with patent stents in the right coronary artery and LAD.  However, there is a distal tight right coronary artery lesion that may be causing his symptoms. PLAN:  The patient will be started on Imdur.  He is going to get sublingual nitroglycerin.  He has had a stable pattern of angina, but he has resting discomfort. S/p overlapping DES x2 RCA.    ECG  NSR with non specific ST abnormalities unchanged from previous.  ASSESSMENT AND PLAN  Unstable angina - CP very concerning for [place]. Will need cath. Admit tele. Cycle markers. Treat with ASA, statin, b-blocker, heparin and IV NTG. Has not restarted Effient yet so will hold until cath done given possibility of surgical disease.   CAD- Last LHC in 05/2010 with nonobstructive three-vessel CAD with patent stents in the RCA and LAD.  However, there was a distal tight RCA lesion that was thought to be the culprit  for his sx. S/p overlapping DES x2 RCA.   Hypokalemia- mild.  3.6 will supplement.   HTN- continue current regimen  DM2 - hold metformin. Continue insulin. Cover with SSI    Signed, Allena Katz 02/16/2014, 4:09 PM  Pager [phone]  Patient seen and examined with Cline Crock, PA-C. We discussed all aspects of the encounter. I agree with the assessment and plan as stated above.  Pain very concerning for [place]. Admit. Cycle markers. Treat with ASA, statin, b-blocker, heparin and IV NTG. Cath tomorrow.   Mance Vallejo,MD 4:43 PM  Addendum: Troponin back = 2 c/w NSTEMI. Change to SDU.  Priseis Cratty,MD 4:52 PM

## 2014-02-17 ENCOUNTER — Encounter (HOSPITAL_COMMUNITY): Payer: Self-pay | Admitting: General Practice

## 2014-02-17 ENCOUNTER — Encounter (HOSPITAL_COMMUNITY)
Admission: EM | Disposition: A | Discharge status: Home / Self Care | Payer: Self-pay | Source: Home / Self Care | Attending: Internal Medicine

## 2014-02-17 DIAGNOSIS — I214 Non-ST elevation (NSTEMI) myocardial infarction: Secondary | ICD-10-CM

## 2014-02-17 DIAGNOSIS — I2510 Atherosclerotic heart disease of native coronary artery without angina pectoris: Secondary | ICD-10-CM

## 2014-02-17 HISTORY — PX: LEFT HEART CATHETERIZATION WITH CORONARY ANGIOGRAM: SHX5451

## 2014-02-17 LAB — GLUCOSE, CAPILLARY
GLUCOSE-CAPILLARY: 164 mg/dL — AB (ref 70–99)
GLUCOSE-CAPILLARY: 238 mg/dL — AB (ref 70–99)
Glucose-Capillary: 135 mg/dL — ABNORMAL HIGH (ref 70–99)
Glucose-Capillary: 113 mg/dL — ABNORMAL HIGH (ref 70–99)
Glucose-Capillary: 116 mg/dL — ABNORMAL HIGH (ref 70–99)

## 2014-02-17 LAB — BASIC METABOLIC PANEL
Anion gap: 15 (ref 5–15)
BUN: 13 mg/dL (ref 6–23)
CHLORIDE: 105 meq/L (ref 96–112)
CO2: 23 meq/L (ref 19–32)
Calcium: 8.7 mg/dL (ref 8.4–10.5)
Creatinine, Ser: 0.5 mg/dL (ref 0.50–1.35)
GFR calc non Af Amer: >90 mL/min (ref >90)
Glucose, Bld: 215 mg/dL — ABNORMAL HIGH (ref 70–99)
POTASSIUM: 4 meq/L (ref 3.7–5.3)
Sodium: 143 mEq/L (ref 137–147)

## 2014-02-17 LAB — PROTIME-INR
INR: 1.09 (ref 0.00–1.49)
Prothrombin Time: 14.1 seconds (ref 11.6–15.2)

## 2014-02-17 LAB — HEPARIN LEVEL (UNFRACTIONATED)
Heparin Unfractionated: <0.1 IU/mL — ABNORMAL LOW (ref 0.30–0.70)
Heparin Unfractionated: 0.29 IU/mL — ABNORMAL LOW (ref 0.30–0.70)
Heparin Unfractionated: 0.16 IU/mL — ABNORMAL LOW (ref 0.30–0.70)

## 2014-02-17 LAB — CBC
HCT: 40.3 % (ref 39.0–52.0)
Hemoglobin: 13.8 g/dL (ref 13.0–17.0)
MCH: 28.5 pg (ref 26.0–34.0)
MCHC: 34.2 g/dL (ref 30.0–36.0)
MCV: 83.3 fL (ref 78.0–100.0)
Platelets: 183 10*3/uL (ref 150–400)
RBC: 4.84 MIL/uL (ref 4.22–5.81)
RDW: 13.8 % (ref 11.5–15.5)
WBC: 11.9 10*3/uL — AB (ref 4.0–10.5)

## 2014-02-17 LAB — MRSA PCR SCREENING: MRSA BY PCR: NEGATIVE

## 2014-02-17 LAB — TROPONIN I
TROPONIN I: 3.86 ng/mL — AB (ref <0.30)
Troponin I: 7.81 ng/mL (ref <0.30)

## 2014-02-17 SURGERY — LEFT HEART CATHETERIZATION WITH CORONARY ANGIOGRAM
Anesthesia: LOCAL

## 2014-02-17 MED ORDER — MIDAZOLAM HCL 2 MG/2ML IJ SOLN
INTRAMUSCULAR | Status: AC
  Filled 2014-02-17: qty 2

## 2014-02-17 MED ORDER — VERAPAMIL HCL 2.5 MG/ML IV SOLN
INTRAVENOUS | Status: AC
  Filled 2014-02-17: qty 2

## 2014-02-17 MED ORDER — HEPARIN SODIUM (PORCINE) 1000 UNIT/ML IJ SOLN
INTRAMUSCULAR | Status: AC
  Filled 2014-02-17: qty 1

## 2014-02-17 MED ORDER — ASPIRIN 81 MG PO CHEW
162.0000 mg | CHEWABLE_TABLET | ORAL | Status: AC
  Administered 2014-02-17: 12:00:00 162 mg via ORAL
  Filled 2014-02-17: qty 2

## 2014-02-17 MED ORDER — SODIUM CHLORIDE 0.9 % IV SOLN: 250.0000 mL | INTRAVENOUS | Status: DC | PRN

## 2014-02-17 MED ORDER — HEPARIN BOLUS VIA INFUSION
2500.0000 [IU] | Freq: Once | INTRAVENOUS | Status: AC
  Administered 2014-02-17: 02:00:00 2500 [IU] via INTRAVENOUS
  Filled 2014-02-17: qty 2500

## 2014-02-17 MED ORDER — NITROGLYCERIN 1 MG/10 ML FOR IR/CATH LAB
INTRA_ARTERIAL | Status: AC
  Filled 2014-02-17: qty 10

## 2014-02-17 MED ORDER — LIDOCAINE HCL (PF) 1 % IJ SOLN
INTRAMUSCULAR | Status: AC
  Filled 2014-02-17: qty 30

## 2014-02-17 MED ORDER — NITROGLYCERIN IN D5W 200-5 MCG/ML-% IV SOLN: 10.0000 ug/min | INTRAVENOUS | Status: DC

## 2014-02-17 MED ORDER — SODIUM CHLORIDE 0.9 % IJ SOLN: 3.0000 mL | INTRAMUSCULAR | Status: DC | PRN

## 2014-02-17 MED ORDER — HEPARIN (PORCINE) IN NACL 2-0.9 UNIT/ML-% IJ SOLN
INTRAMUSCULAR | Status: AC
  Filled 2014-02-17: qty 1000

## 2014-02-17 MED ORDER — FENTANYL CITRATE 0.05 MG/ML IJ SOLN
INTRAMUSCULAR | Status: AC
  Filled 2014-02-17: qty 2

## 2014-02-17 MED ORDER — CLOPIDOGREL BISULFATE 75 MG PO TABS
75.0000 mg | ORAL_TABLET | Freq: Every day | ORAL | Status: DC
  Administered 2014-02-17: 75 mg via ORAL
  Filled 2014-02-17: qty 1

## 2014-02-17 MED ORDER — SODIUM CHLORIDE 0.9 % IJ SOLN
3.0000 mL | Freq: Two times a day (BID) | INTRAMUSCULAR | Status: DC
  Administered 2014-02-18: 10:00:00 3 mL via INTRAVENOUS

## 2014-02-17 MED ORDER — SODIUM CHLORIDE 0.9 % IV SOLN: 1.0000 mL/kg/h | INTRAVENOUS | Status: AC

## 2014-02-17 MED ORDER — ALPRAZOLAM 0.25 MG PO TABS
0.2500 mg | ORAL_TABLET | Freq: Three times a day (TID) | ORAL | Status: DC | PRN
  Administered 2014-02-17 (×2): 0.25 mg via ORAL
  Filled 2014-02-17 (×2): qty 1

## 2014-02-17 NOTE — Progress Notes (Signed)
CRITICAL VALUE ALERT  Critical value received:  Troponin 7.81  Date of notification:  02/17/14  Time of notification:  0850  Critical value read back:Yes.    Nurse who received alert:  Seymour Bars  MD notified (1st page):  Theodore Demark PA  Time of first page:  [phone]  MD notified (2nd page):  Time of second page:  Responding MD:  Theodore Demark PA  Time MD responded:  [phone]

## 2014-02-17 NOTE — CV Procedure (Signed)
Cardiac Catheterization Procedure Note  Name: [name] [name] MRN: [id number] DOB: 1946/01/04  Procedure: Left Heart Cath, Selective Coronary Angiography, LV angiography  Indication: non-ST elevation MI. This is a 68 year old gentleman with known CAD. He's undergone multiple PCI procedures involving both right coronary artery and LAD. His last cardiac catheterization was in 2011. He presented with prolonged chest pain and elevated troponin. He is referred for cardiac catheterization and possible PCI.   Procedural Details: The right wrist was prepped, draped, and anesthetized with 1% lidocaine. Using the modified Seldinger technique, a 5/6 French Slender sheath was introduced into the right radial artery. 3 mg of verapamil was administered through the sheath, weight-based unfractionated heparin was administered intravenously. I was unable to advance standard wires above the elbow. Radial artery angiography was performed and there was a radial loop demonstrated. The loop was first medicated with a cougar wire, but I was unable to pass a diagnostic catheter beyond the radial loop. A 0.035 inch angled Glidewire was then utilized and also successfully navigated the loop. Over that wire I was able to pass a JR 4 catheter. A versacore wire was then advanced up to about the shoulder and it was used to straighten the radial loop. I was able to advance the JR 4 catheter in to the aortic root, but the patient had significant pain in his arm. I was unable to torque the catheter. Ultimately the right radial site was aborted because of inability to advance or torque catheters.   Attention was then turned to the left radial site. Access was obtained and a 5/6 [place] slender sheath was introduced into the left radial artery. Verapamil was administered through the sheath. Standard Judkins catheters were utilized. I was again unable to work the [employer] 4 catheter. Ultimately, a 5 [place] JR 4 guide catheter and a 5 [place]  EBU 3.5 cm guide catheter were utilized for right and left coronary angiography, respectively.  There were no immediate procedural complications. A TR band was used for radial hemostasis at the completion of the procedure.  The patient was transferred to the post catheterization recovery area for further monitoring.  Procedural Findings: Hemodynamics: AO 112/63 LV 112/15  Coronary angiography: Coronary dominance: right  Left mainstem: The left main is patent. There are minor irregularities noted. The left main supplies the LAD and a diagonal branch.  Left anterior descending (LAD): The LAD has moderate calcification. There are diffuse irregularities but no high-grade stenoses are present. The stented segment in the mid LAD is widely patent. The diagonal branches are patent with no high-grade disease. The first diagonal does have a long 50-60% stenosis present.  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 The vessel has diffuse irregularity with no high-grade disease. The OM branches are patent with diffuse nonobstructive disease.  Right coronary artery (RCA): The RCA is totally occluded in its midportion within the previously stented segment. The PDA, PLA, and distal RCA filled from left to right collaterals  Left ventriculography: The inferior wall is akinetic. The anterolateral wall is hypokinetic. The LVEF is estimated at 40-45%.  Contrast: 90 cc Omnipaque  Estimated Blood Loss: Minimal  Final Conclusions:   1. Total occlusion of the mid RCA with left to right collaterals 2. Continue patency of the mid LAD stent with nonobstructive LAD/diagonal stenosis 3. 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 Moderate segmental LV systolic dysfunction  Recommendations: Post MI medical therapy. Add clopidogrel 75  mg daily. Check a 2-D echocardiogram. There is no indication to open the occluded RCA as the  patient had prolonged pain, and there are now left to right collaterals with no residual chest pain.  Tonny Bollman MD, [facility] 02/17/2014, 5:35 PM

## 2014-02-17 NOTE — Progress Notes (Signed)
ANTICOAGULATION CONSULT NOTE - Follow Up Consult  Pharmacy Consult for heparin Indication: chest pain/ACS  Allergies  Allergen Reactions  . Penicillins     Patient Measurements: Height: 5\' 10"  (177.8 cm) Weight: 184 lb 15.5 oz (83.9 kg) IBW/kg (Calculated) : 73 Heparin Dosing Weight: 88 kg  Vital Signs: Temp: 97.7 F (36.5 C) (09/02 0600) Temp src: Oral (09/02 0600) BP: 131/68 mmHg (09/02 0600) Pulse Rate: 65 (09/02 0600)  Labs:  Recent Labs  02/16/14 1543 02/16/14 1601 02/16/14 2026 02/17/14 0035  HGB 15.1 15.3  --  13.8  HCT 43.4 45.0  --  40.3  PLT 182  --   --  183  LABPROT  --   --   --  14.1  INR  --   --   --  1.09  HEPARINUNFRC  --   --   --  <0.10*  CREATININE  --  0.50  --  0.50  TROPONINI 2.72*  --  7.46* 3.86*    Estimated Creatinine Clearance: 93.8 ml/min (by C-G formula based on Cr of 0.5).   Medications:  Scheduled:  . allopurinol  100 mg Oral Daily  . amLODipine  10 mg Oral Daily  . aspirin  324 mg Oral NOW   Or  . aspirin  300 mg Rectal NOW  . aspirin EC  81 mg Oral Daily  . atorvastatin  80 mg Oral QHS  . fenofibrate  160 mg Oral Daily  . insulin aspart  0-15 Units Subcutaneous TID WC  . insulin aspart  0-5 Units Subcutaneous QHS  . isosorbide mononitrate  60 mg Oral Daily  . losartan  50 mg Oral Daily  . metoprolol  100 mg Oral BID  . pantoprazole  40 mg Oral Daily  . ramipril  10 mg Oral Daily  . sodium chloride  3 mL Intravenous Q12H   Infusions:  . sodium chloride 1 mL/kg/hr (02/17/14 0400)  . heparin 1,400 Units/hr (02/17/14 0700)  . nitroGLYCERIN 10 mcg/min (02/16/14 2200)    Assessment: 68 yo male with ACS is currently on slightly subthearpeutic heparin.  Heparin level this am is 0.29.  Hgb 13.8 and Plt 183 K Goal of Therapy:  Heparin level 0.3-0.7 units/ml Monitor platelets by anticoagulation protocol: Yes   Plan:  - inc heparin drip to 1500 units/hr - 1530 heparin level or f/u after cath  Doshia Dalia,  T[facility]in 02/17/2014,8:26 AM

## 2014-02-17 NOTE — Interval H&P Note (Signed)
History and Physical Interval Note:  02/17/2014 4:02 PM  [name] [name]  has presented today for surgery, with the diagnosis of cp  The various methods of treatment have been discussed with the patient and family. After consideration of risks, benefits and other options for treatment, the patient has consented to  Procedure(s): LEFT HEART CATHETERIZATION WITH CORONARY ANGIOGRAM (N/A) as a surgical intervention .  The patient's history has been reviewed, patient examined, no change in status, stable for surgery.  I have reviewed the patient's chart and labs.  Questions were answered to the patient's satisfaction.    Cath Lab Visit (complete for each Cath Lab visit)  Clinical Evaluation Leading to the Procedure:   ACS: Yes.    Non-ACS:    Anginal Classification: CCS IV  Anti-ischemic medical therapy: Maximal Therapy (2 or more classes of medications)  Non-Invasive Test Results: No non-invasive testing performed  Prior CABG: No previous CABG       Tonny Bollman

## 2014-02-17 NOTE — Progress Notes (Signed)
ANTICOAGULATION CONSULT NOTE - Follow-up Consult  Pharmacy Consult for heparin Indication: chest pain/ACS  Allergies  Allergen Reactions  . Penicillins     Patient Measurements: Height: 5\' 10"  (177.8 cm) Weight: 184 lb 15.5 oz (83.9 kg) IBW/kg (Calculated) : 73 Heparin Dosing Weight: 88kg  Vital Signs: Temp: 98 F (36.7 C) (09/01 2330) Temp src: Oral (09/01 2330) BP: 125/63 mmHg (09/01 2330) Pulse Rate: 74 (09/01 2330)  Labs:  Recent Labs  02/16/14 1543 02/16/14 1601 02/16/14 2026 02/17/14 0035  HGB 15.1 15.3  --  13.8  HCT 43.4 45.0  --  40.3  PLT 182  --   --  183  HEPARINUNFRC  --   --   --  <0.10*  CREATININE  --  0.50  --   --   TROPONINI 2.72*  --  7.46*  --     Estimated Creatinine Clearance: 93.8 ml/min (by C-G formula based on Cr of 0.5).  Assessment: 68 yom on heparin for NSTEMI. Trop uo to 7.46. For cardiac cath tomorrow. No issues with infusion per RN. No bleeding noted.  Goal of Therapy:  Heparin level 0.3-0.7 units/ml Monitor platelets by anticoagulation protocol: Yes   Plan:  1. Heparin rebolus 2500 units IV 2. Increase Heparin gtt to 1400 units/hr 3. Check a 6 hour heparin level or f/u post cath  Christoper Fabian, PharmD, BCPS Clinical pharmacist, pager [phone] 02/17/2014,1:23 AM

## 2014-02-17 NOTE — Progress Notes (Signed)
ANTICOAGULATION CONSULT NOTE - Follow Up Consult  Pharmacy Consult for heparin Indication: chest pain/ACS  Allergies  Allergen Reactions  . Penicillins     Patient Measurements: Height: 5\' 10"  (177.8 cm) Weight: 184 lb 15.5 oz (83.9 kg) IBW/kg (Calculated) : 73 Heparin Dosing Weight: 88 kg  Vital Signs: Temp: 98.1 F (36.7 C) (09/02 1532) Temp src: Oral (09/02 1532) BP: 150/63 mmHg (09/02 1532) Pulse Rate: 70 (09/02 1200)  Labs:  Recent Labs  02/16/14 1543 02/16/14 1601 02/16/14 2026 02/17/14 0035 02/17/14 0752 02/17/14 1521  HGB 15.1 15.3  --  13.8  --   --   HCT 43.4 45.0  --  40.3  --   --   PLT 182  --   --  183  --   --   LABPROT  --   --   --  14.1  --   --   INR  --   --   --  1.09  --   --   HEPARINUNFRC  --   --   --  <0.10* 0.29* 0.16*  CREATININE  --  0.50  --  0.50  --   --   TROPONINI 2.72*  --  7.46* 3.86* 7.81*  --     Estimated Creatinine Clearance: 93.8 ml/min (by C-G formula based on Cr of 0.5).   Medications:  Scheduled:  . allopurinol  100 mg Oral Daily  . amLODipine  10 mg Oral Daily  . aspirin  324 mg Oral NOW   Or  . aspirin  300 mg Rectal NOW  . aspirin EC  81 mg Oral Daily  . atorvastatin  80 mg Oral QHS  . fenofibrate  160 mg Oral Daily  . insulin aspart  0-15 Units Subcutaneous TID WC  . insulin aspart  0-5 Units Subcutaneous QHS  . isosorbide mononitrate  60 mg Oral Daily  . losartan  50 mg Oral Daily  . metoprolol  100 mg Oral BID  . pantoprazole  40 mg Oral Daily  . ramipril  10 mg Oral Daily  . sodium chloride  3 mL Intravenous Q12H   Infusions:  . sodium chloride 1 mL/kg/hr (02/17/14 1532)  . heparin Stopped (02/17/14 1532)  . nitroGLYCERIN 10 mcg/min (02/16/14 2200)    Assessment: 68 yo male with ACS is currently on subthearpeutic heparin.  Heparin level is 0.16 but heparin drip was stopped due to preparation for cath.  Goal of Therapy:  Heparin level 0.3-0.7 units/ml Monitor platelets by anticoagulation  protocol: Yes   Plan:  - f/u after cath.  Liora Myles, [facility] 02/17/2014,3:51 PM

## 2014-02-17 NOTE — Progress Notes (Signed)
UR completed Magalie Almon K. Chanita Boden, RN, BSN, MSHL, CCM  02/17/2014 9:36 PM

## 2014-02-17 NOTE — Progress Notes (Addendum)
Patient: [name] [name] / Admit Date: 02/16/2014 / Date of Encounter: 02/17/2014, 7:57 AM   Subjective: No chest pain, palpitations, SOB, or dyspnea. He is for cardiac cath today.    Objective: Telemetry: NSR, 60s Physical Exam: Blood pressure 131/68, pulse 65, temperature 97.7 F (36.5 C), temperature source Oral, resp. rate 18, height 5\' 10"  (1.778 m), weight 184 lb 15.5 oz (83.9 kg), SpO2 91.00%. General: Well developed, well nourished, in no acute distress. Head: Normocephalic, atraumatic, sclera non-icteric, no xanthomas, nares are without discharge. Neck: Negative for carotid bruits. JVP not elevated. Lungs: Clear bilaterally to auscultation without wheezes, rales, or rhonchi. Breathing is unlabored. Heart: RRR S1 S2 without murmurs, rubs, or gallops.  Abdomen: Soft, non-tender, non-distended with normoactive bowel sounds. No rebound/guarding. Extremities: No clubbing or cyanosis. No edema. Distal pedal pulses are 2+ and equal bilaterally. Neuro: Alert and oriented X 3. Moves all extremities spontaneously. Psych:  Responds to questions appropriately with a normal affect.   Intake/Output Summary (Last 24 hours) at 02/17/14 0757 Last data filed at 02/17/14 0700  Gross per 24 hour  Intake  825.5 ml  Output   1900 ml  Net -1074.5 ml    Inpatient Medications:  . allopurinol  100 mg Oral Daily  . amLODipine  10 mg Oral Daily  . aspirin  324 mg Oral NOW   Or  . aspirin  300 mg Rectal NOW  . aspirin  81 mg Oral Pre-Cath  . aspirin EC  81 mg Oral Daily  . atorvastatin  80 mg Oral QHS  . fenofibrate  160 mg Oral Daily  . insulin aspart  0-15 Units Subcutaneous TID WC  . insulin aspart  0-5 Units Subcutaneous QHS  . isosorbide mononitrate  60 mg Oral Daily  . losartan  50 mg Oral Daily  . metoprolol  100 mg Oral BID  . pantoprazole  40 mg Oral Daily  . ramipril  10 mg Oral Daily  . sodium chloride  3 mL Intravenous Q12H   Infusions:  . sodium chloride 1 mL/kg/hr  (02/17/14 0400)  . heparin 1,400 Units/hr (02/17/14 0700)  . nitroGLYCERIN 10 mcg/min (02/16/14 2200)    Labs:  Recent Labs  02/16/14 1601 02/17/14 0035  NA 142 143  K 3.6* 4.0  CL 108 105  CO2  --  23  GLUCOSE 179* 215*  BUN 12 13  CREATININE 0.50 0.50  CALCIUM  --  8.7   No results found for this basename: AST, ALT, ALKPHOS, BILITOT, PROT, ALBUMIN,  in the last 72 hours  Recent Labs  02/16/14 1543 02/16/14 1601 02/17/14 0035  WBC 10.6*  --  11.9*  NEUTROABS 7.2  --   --   HGB 15.1 15.3 13.8  HCT 43.4 45.0 40.3  MCV 83.0  --  83.3  PLT 182  --  183    Recent Labs  02/16/14 1543 02/16/14 2026 02/17/14 0035  TROPONINI 2.72* 7.46* 3.86*   No components found with this basename: POCBNP,  No results found for this basename: HGBA1C,  in the last 72 hours   Radiology/Studies:  Dg Chest Portable 1 View  02/16/2014   CLINICAL DATA:  Chest pain.  EXAM: PORTABLE CHEST - 1 VIEW  COMPARISON:  September 20, 2004.  FINDINGS: The heart size and mediastinal contours are within normal limits. Both lungs are clear. No pneumothorax or pleural effusion is noted. The visualized skeletal structures are unremarkable.  IMPRESSION: No acute cardiopulmonary abnormality seen.   Electronically Signed  By: Roque Lias M.D.   On: 02/16/2014 15:59     Assessment and Plan  68 y.o. male with h/o CAD s/p previous stenting in RCA & LAD; s/p overlapping DES x 2 RCA (2011), DM2, HTN, remote tobacco abuse who presented to [facility] on 02/16/14 with severe bilateral arm pain radiating to his chest while working at [employer]. Symptoms similar to previous angina. Ruled in with troponin of 2.72-->7.46-->3.86. Nonspecific EKG changes.   1. NSTEMI: -He is for cardiac cath today.  -Currently chest pain free -Cardiac cath today-not currently on Effient, so will hold given possibility of surgical disease-see #2 -Currently on NTG gtt, had nurse hold Imdur pending cath results  -Heparin gtt, aspirin, b-blocker,  statin,   2. CAD: -Last LHC 05/2010 w/ nonobs 3v CAD w/ patent stents in RCA & LAD. However, there was distal tight RCA lesion that was thought to be a culprit for his symptoms.  3. Hypokalemia: -Resolved  4. HTN: -Controlled  5. DM2 -Metformin has been held since admission -On SSI   Signed, Eula Listen, PA-C 02/17/2014 8:10 AM  As above, patient seen and examined. No chest pain. Patient has ruled in for non-ST elevation myocardial infarction. Proceed with catheterization today. The risks and benefits were discussed and the patient agrees to proceed. Continue aspirin, beta blocker, statin, heparin and nitrates. Hold Glucophage for 48 hours following procedure. [name]

## 2014-02-17 NOTE — Care Management Note (Addendum)
  Page 1 of 1   02/18/2014     12:03:17 PM CARE MANAGEMENT NOTE 02/18/2014  Patient:  [name] [name], [name] [name]   Account Number:  [id number]  Date Initiated:  02/17/2014  Documentation initiated by:  Donato Schultz  Subjective/Objective Assessment:   CP, NSTEMI     Action/Plan:   CM to follow for disposition needs   Anticipated DC Date:  02/18/2014   Anticipated DC Plan:  HOME/SELF CARE      DC Planning Services  CM consult  Medication Assistance      Choice offered to / List presented to:             Status of service:  Completed, signed off Medicare Important Message given?   (If response is "NO", the following Medicare IM given date fields will be blank) Date Medicare IM given:   Medicare IM given by:   Date Additional Medicare IM given:   Additional Medicare IM given by:    Discharge Disposition:  HOME/SELF CARE  Per UR Regulation:  Reviewed for med. necessity/level of care/duration of stay  If discussed at Long Length of Stay Meetings, dates discussed:    Comments:  Kaho Selle RN, BSN, MSHL, CCM  Nurse - Case Manager,  (Unit [phone]  02/18/2014 Socail:  From home Med Review:  prasugrel (EFFIENT) tablet 10 mg  :  Dose 10 mg  :  Oral  :  Daily Benefits check request sent for coverage clarification. Hx/o active with Effient - has 3 month Rx filled and ready for pick up at [facility] local pharmacy. Disposition Plan:  Home / Self care.

## 2014-02-18 ENCOUNTER — Encounter (HOSPITAL_COMMUNITY): Payer: Self-pay | Admitting: Physician Assistant

## 2014-02-18 DIAGNOSIS — I517 Cardiomegaly: Secondary | ICD-10-CM

## 2014-02-18 LAB — GLUCOSE, CAPILLARY
Glucose-Capillary: 165 mg/dL — ABNORMAL HIGH (ref 70–99)
Glucose-Capillary: 192 mg/dL — ABNORMAL HIGH (ref 70–99)

## 2014-02-18 LAB — CBC
HEMATOCRIT: 41 % (ref 39.0–52.0)
HEMOGLOBIN: 14.2 g/dL (ref 13.0–17.0)
MCH: 29.1 pg (ref 26.0–34.0)
MCHC: 34.6 g/dL (ref 30.0–36.0)
MCV: 84 fL (ref 78.0–100.0)
Platelets: UNDETERMINED 10*3/uL (ref 150–400)
RBC: 4.88 MIL/uL (ref 4.22–5.81)
RDW: 13.6 % (ref 11.5–15.5)
WBC: 10.1 10*3/uL (ref 4.0–10.5)

## 2014-02-18 LAB — BASIC METABOLIC PANEL
Anion gap: 13 (ref 5–15)
BUN: 11 mg/dL (ref 6–23)
CHLORIDE: 105 meq/L (ref 96–112)
CO2: 24 meq/L (ref 19–32)
CREATININE: 0.49 mg/dL — AB (ref 0.50–1.35)
Calcium: 8.6 mg/dL (ref 8.4–10.5)
GFR calc Af Amer: >90 mL/min (ref >90)
GFR calc non Af Amer: >90 mL/min (ref >90)
Glucose, Bld: 190 mg/dL — ABNORMAL HIGH (ref 70–99)
Potassium: 3.8 mEq/L (ref 3.7–5.3)
Sodium: 142 mEq/L (ref 137–147)

## 2014-02-18 MED ORDER — METFORMIN HCL 500 MG PO TABS: 1000.0000 mg | ORAL_TABLET | Freq: Four times a day (QID) | ORAL | Status: DC

## 2014-02-18 MED ORDER — PRASUGREL HCL 10 MG PO TABS
10.0000 mg | ORAL_TABLET | Freq: Every day | ORAL | Status: DC
  Administered 2014-02-18: 10:00:00 10 mg via ORAL
  Filled 2014-02-18: qty 1

## 2014-02-18 NOTE — Progress Notes (Signed)
CARDIAC REHAB PHASE I   PRE:  Rate/Rhythm: 86 SR  BP:  Supine:   Sitting: 156/78  Standing:    SaO2:   MODE:  Ambulation: 500 ft   POST:  Rate/Rhythm: 96 SR  BP:  Supine:   Sitting: 138/59  Standing:    SaO2:  0850-0932 Pt walked 500 ft with steady gait. No CP but stated left side a little uncomfortable, pointed below armpit. MI education completed with pt who voiced understanding. Pt did not know how to use NTG so reviewed use. Discussed MI restrictions as pt works two part-time jobs. Encouraged to take it easy with no heavy lifting. Pt stated his walking is limited as far as distance goes so gave modified ex ed,. Pt not interested in Phase 2 CRP. Gave heart healthy and diabetic diets as discussed good choices briefly. Pt stated going to eat what he likes as you have to die from something. Encouraged to eat greens without fat back. Does like chicken,fruits and vegetables and monitors sugars frequently during the day.   Luetta Nutting, RN BSN  02/18/2014 9:30 AM

## 2014-02-18 NOTE — Discharge Summary (Signed)
Discharge Summary   Patient ID: [name] [name] MRN: [id number], DOB/AGE: 07/13/1945 68 y.o. Admit date: 02/16/2014 D/C date:     02/18/2014  Primary Care Provider: Josue Hector, MD Primary Cardiologist: Dr. Antoine Poche, MD   Primary Discharge Diagnoses:  1. NSTEMI - cath 02/17/2014 treated medically (see below) 2. CAD s/p previous stenting in RCA & LAD; s/p overlapping DES x 2 RCA (2011) 3. Grade 1 diastolic dysfunction 4. Hypotension-Cozaar discontinued   Secondary Discharge Diagnoses:  1. DM2 2. HLD 3. PVD 4. OSA 5. Mild bilateral renal artery stenosis 6. Remote tobacco abuse  Hospital Course: 68 y.o. male with a history of DM, HLD, PVD, OSA, mild bilateral renal artery stenosis, remote tobacco abuse, and CAD s/p previous stenting in RCA and LAD; s/p overlapping DESx2 (2011) who presented to [facility] ED on 02/16/14 with chest pain.   He was seen in the office for regular cardiology follow up on 10/14/13 and reported having bilateral arm pain similar to previous angina but not as severe. He declined a nuclear stress test at that time and was restarted on Effient as he had run out of his plavix and stopped taking it.   On the morning of 9/1 he developed severe bilateral arm pain radiating to his chest while working at [employer]. [place]an[place] it felt very similar to previous angina. + flushing. No nausea or diaphoresis. Unlike recent episodes, this time pain persisted. Went home and took a nap and woke up and pain still there. Came to ER. Pain relieved promptly with IV NTG. ECG with nonspecific ST scooping. Troponin 2.72-->7.46-->3.86-->7.81. 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 There was no indication to open the occluded RCA as he  had prolonged pain and there were now left to right collaterals with no residual chest pain. He was started on Plavix 75 mg daily post cath, however this was changed back to Effient post discharge as he was previously on this at home and there was some concern back in 2011/2012 regarding his response to Plavix, so he was changed to Effient. (P2Y12 of 273 on 06/06/10, followed by 52 on 06/07/10). A 2-D echo was performed post cath revealing an EF of 45-50%, mild LVH, AK of the inferior and inferoseptal myocardium, G1DD. He was seen by cardiac rehab this morning and ambulated 500 ft with steady gait. No chest pain. Education was provided. He declined Phase 2 CRP and further stated that he was going to eat what he likes as you have to die from something. His discharge labs include: K+3.8, SCr 0.49, hgb 14.2. He will be discharged on aspirin 81 mg, Effient 10 mg, Lipitor 80 mg, Toprol XL 100 bid, Norvasc 10 mg, Imdur 60 mg, Altace 10 mg, metformin 500 mg qid (to be started 48 hours post cath). His Cozaar was discontinued due to soft BP and concomitant ACEi use. His remaining medications will remain the same. He will be off work x 14 days. He was seen by Dr. Jens Som today and felt to be stable and ready for discharge.   Consults: None  Discharge Vitals: Blood pressure 156/78, pulse 94, temperature 97.7 F (36.5 C), temperature source Oral, resp. rate 20, height  (1.778 m), weight 184 lb 15.5 oz (83.9 kg), SpO2 94.00%.  Labs: Lab Results  Component Value Date   WBC 10.1 02/18/2014   HGB 14.2 02/18/2014   HCT 41.0 02/18/2014   MCV 84.0 02/18/2014   PLT PLATELET CLUMPS NOTED ON SMEAR, UNABLE TO ESTIMATE 02/18/2014     Recent Labs Lab 02/18/14 1136  NA 142  K 3.8  CL 105  CO2 24  BUN 11  CREATININE 0.49*  CALCIUM 8.6  GLUCOSE 190*    Recent Labs  02/16/14 1543 02/16/14 2026 02/17/14 0035 02/17/14 0752  TROPONINI 2.72* 7.46* 3.86* 7.81*   Lab Results  Component Value Date   CHOL 132  05/30/2010   HDL 24* 05/30/2010   LDLCALC See Comment mg/dL 65/99/3570   TRIG 177* 05/30/2010       Diagnostic Studies/Procedures   Dg Chest Portable 1 View  02/16/2014: IMPRESSION: No acute cardiopulmonary abnormality seen.   Electronically Signed   By: Roque Lias M.D.   On: 02/16/2014 15:59   Cardiac Cath 02/17/14:  Procedural Findings:  Hemodynamics:  AO 112/63  LV 112/15  Coronary angiography:  Coronary dominance: right  Left mainstem: The left main is patent. There are minor irregularities noted. The left main supplies the LAD and a diagonal branch.  Left anterior descending (LAD): The LAD has moderate calcification. There are diffuse irregularities but no high-grade stenoses are present. The stented segment in the mid LAD is widely patent. The diagonal branches are patent with no high-grade disease. The first diagonal does have a long 50-60% stenosis present.  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 The vessel has diffuse irregularity with no high-grade disease. The OM branches are patent with diffuse nonobstructive disease.  Right coronary artery (RCA): The RCA is totally occluded in its midportion within the previously stented segment. The PDA, PLA, and distal RCA filled from left to right collaterals  Left ventriculography: The inferior wall is akinetic. The anterolateral wall is hypokinetic. The LVEF is estimated at 40-45%.  Contrast: 90 cc Omnipaque  Estimated Blood Loss: Minimal  Final Conclusions:  1. Total occlusion of the mid RCA with left to right collaterals  2. Continue patency of the mid LAD stent with nonobstructive LAD/diagonal stenosis  3. 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 Moderate segmental LV systolic dysfunction  Recommendations: Post MI medical therapy. Add clopidogrel 75 mg daily. Check a 2-D echocardiogram. There is no indication to open the occluded RCA  as the patient had prolonged pain, and there are now left to right collaterals with no residual chest pain.  Echo 02/18/14:  Study Conclusions - Left ventricle: The cavity size was normal. Wall thickness was increased in a pattern of mild LVH. Systolic function was mildly reduced. The estimated ejection fraction was in the range of 45% to 50%. There is akinesis of the inferior and inferoseptal myocardium. Doppler parameters are consistent with abnormal left ventricular relaxation (grade 1 diastolic dysfunction).    Discharge Medications   Current Discharge Medication List    CONTINUE these medications which have CHANGED   Details  metFORMIN (GLUCOPHAGE) 500 MG tablet Take 2 tablets (1,000 mg total) by mouth 4 (four) times daily.      CONTINUE these medications which have NOT CHANGED   Details  allopurinol (ZYLOPRIM) 100 MG tablet Take 100 mg by mouth daily.      amLODipine (NORVASC) 10 MG tablet Take 1 tablet (10 mg total) by mouth daily. Qty: 90 tablet, Refills: 3    aspirin 81 MG tablet  Take 81 mg by mouth daily.      atorvastatin (LIPITOR) 80 MG tablet Take 1 tablet (80 mg total) by mouth daily. Qty: 90 tablet, Refills: 3    dexlansoprazole (DEXILANT) 60 MG capsule Take 60 mg by mouth daily.      fenofibrate (TRICOR) 145 MG tablet Take 1 tablet (145 mg total) by mouth daily. Qty: 90 tablet, Refills: 3    Fish Oil OIL Take 1 capsule by mouth daily.      insulin aspart (NOVOLOG) 100 UNIT/ML injection Inject 10-12 Units into the skin 3 (three) times daily before meals. 90-150=10units, 150-200=11units, 200-250=12units    isosorbide mononitrate (IMDUR) 60 MG 24 hr tablet Take 60 mg by mouth daily. TAKE 1 TABLET BY MOUTH EVERY DAY    metoprolol (TOPROL-XL) 200 MG 24 hr tablet Take 0.5 tablets (100 mg total) by mouth 2 (two) times daily. Qty: 90 tablet, Refills: 3    nitroGLYCERIN (NITROSTAT) 0.4 MG SL tablet Place 1 tablet (0.4 mg total) under the tongue every 5 (five)  minutes as needed for chest pain. Qty: 25 tablet, Refills: 11    prasugrel (EFFIENT) 10 MG TABS tablet Take 10 mg by mouth daily. TAKE 1 TABLET EVERY DAY    ramipril (ALTACE) 10 MG capsule Take 10 mg by mouth daily.      STOP taking these medications     losartan (COZAAR) 50 MG tablet         Disposition   The patient will be discharged in stable condition to home. Discharge Instructions   Diet - low sodium heart healthy    Complete by:  As directed      Increase activity slowly    Complete by:  As directed           Follow-up Information   Follow up with Rollene Rotunda, MD On 03/03/2014. (APPT 3:45 PM)    Specialty:  Cardiology   Contact information:   [address] [place] [address] [phone]         Duration of Discharge Encounter: Greater than 30 minutes including physician and PA time.  Elinor Dodge PA-C 02/18/2014, 1:07 PM

## 2014-02-18 NOTE — Progress Notes (Signed)
TR BAND REMOVAL  LOCATION:  right radial  DEFLATED PER PROTOCOL:  Yes.    TIME BAND OFF / DRESSING APPLIED:   2100   SITE UPON ARRIVAL:   Level 0  SITE AFTER BAND REMOVAL:  Level 0  REVERSE ALLEN'S TEST:    positive  CIRCULATION SENSATION AND MOVEMENT:  Within Normal Limits  Yes.    COMMENTS:  2 ND SITE LEFT RADIAL  TR BAND REMOVAL  LOCATION:  left radial  DEFLATED PER PROTOCOL:  Yes.    TIME BAND OFF / DRESSING APPLIED:   2045   SITE UPON ARRIVAL:   Level 0  SITE AFTER BAND REMOVAL:  Level 0  REVERSE ALLEN'S TEST:    positive  CIRCULATION SENSATION AND MOVEMENT:  Within Normal Limits  Yes.    COMMENTS:

## 2014-02-18 NOTE — Progress Notes (Signed)
*  PRELIMINARY RESULTS* Echocardiogram 2D Echocardiogram has been performed.  [name] [name] 02/18/2014, 10:26 AM

## 2014-02-18 NOTE — Discharge Summary (Signed)
See progress notes [name] [name]  

## 2014-02-18 NOTE — Discharge Instructions (Signed)
Post Heart Catheterization Care:  1. No driving for 1 week. No lifting over 5 lbs for 2 weeks. No sexual activity for 2 weeks. You may return to work on 03/04/2014. Keep procedure site clean & dry. If you notice increased pain, swelling, bleeding or pus, call/return!  You may shower, but no soaking baths/hot tubs/pools for 1 week.   2. You may restart your metformin on the evening of 02/19/2014.

## 2014-02-18 NOTE — Progress Notes (Signed)
Patient: [name] [name] / Admit Date: 02/16/2014 / Date of Encounter: 02/18/2014, 8:05 AM   Subjective: No chest pain, palpitations, SOB, dyspnea.   Objective: Telemetry: NSR 80s Physical Exam: Blood pressure 110/48, pulse 85, temperature 97.8 F (36.6 C), temperature source Oral, resp. rate 20, height 5\' 10"  (1.778 m), weight 184 lb 15.5 oz (83.9 kg), SpO2 94.00%. General: Well developed, well nourished, in no acute distress. Head: Normocephalic, atraumatic, sclera non-icteric, no xanthomas, nares are without discharge. Neck: Negative for carotid bruits. JVP not elevated. Lungs: Clear bilaterally to auscultation without wheezes, rales, or rhonchi. Breathing is unlabored. Heart: RRR S1 S2 without murmurs, rubs, or gallops.  Abdomen: Soft, non-tender, non-distended with normoactive bowel sounds. No rebound/guarding. Extremities: No clubbing or cyanosis. No edema. Distal pedal pulses are 2+ and equal bilaterally. Both R and L radial cath sites without bleeding, bruising, bruit, erythema, or drainage.  Neuro: Alert and oriented X 3. Moves all extremities spontaneously. Psych:  Responds to questions appropriately with a normal affect.   Intake/Output Summary (Last 24 hours) at 02/18/14 0805 Last data filed at 02/18/14 0200  Gross per 24 hour  Intake  671.2 ml  Output   1300 ml  Net -628.8 ml    Inpatient Medications:  . allopurinol  100 mg Oral Daily  . amLODipine  10 mg Oral Daily  . aspirin EC  81 mg Oral Daily  . atorvastatin  80 mg Oral QHS  . clopidogrel  75 mg Oral Daily  . fenofibrate  160 mg Oral Daily  . insulin aspart  0-15 Units Subcutaneous TID WC  . insulin aspart  0-5 Units Subcutaneous QHS  . isosorbide mononitrate  60 mg Oral Daily  . losartan  50 mg Oral Daily  . metoprolol  100 mg Oral BID  . pantoprazole  40 mg Oral Daily  . ramipril  10 mg Oral Daily  . sodium chloride  3 mL Intravenous Q12H   Infusions:  . nitroGLYCERIN Stopped (02/17/14 2058)     Labs:  Recent Labs  02/16/14 1601 02/17/14 0035  NA 142 143  K 3.6* 4.0  CL 108 105  CO2  --  23  GLUCOSE 179* 215*  BUN 12 13  CREATININE 0.50 0.50  CALCIUM  --  8.7   No results found for this basename: AST, ALT, ALKPHOS, BILITOT, PROT, ALBUMIN,  in the last 72 hours  Recent Labs  02/16/14 1543  02/17/14 0035 02/18/14 0453  WBC 10.6*  --  11.9* 10.1  NEUTROABS 7.2  --   --   --   HGB 15.1  < > 13.8 14.2  HCT 43.4  < > 40.3 41.0  MCV 83.0  --  83.3 84.0  PLT 182  --  183 PLATELET CLUMPS NOTED ON SMEAR, UNABLE TO ESTIMATE  < > = values in this interval not displayed.  Recent Labs  02/16/14 1543 02/16/14 2026 02/17/14 0035 02/17/14 0752  TROPONINI 2.72* 7.46* 3.86* 7.81*   No components found with this basename: POCBNP,  No results found for this basename: HGBA1C,  in the last 72 hours   Radiology/Studies:  Dg Chest Portable 1 View  02/16/2014   CLINICAL DATA:  Chest pain.  EXAM: PORTABLE CHEST - 1 VIEW  COMPARISON:  September 20, 2004.  FINDINGS: The heart size and mediastinal contours are within normal limits. Both lungs are clear. No pneumothorax or pleural effusion is noted. The visualized skeletal structures are unremarkable.  IMPRESSION: No acute cardiopulmonary abnormality seen.  Electronically Signed   By: Roque Lias M.D.   On: 02/16/2014 15:59     Assessment and Plan  68 y.o. male with h/o CAD s/p previous stenting in RCA & LAD; s/p overlapping DES x 2 RCA (2011), DM2, HTN, remote tobacco abuse who presented to [facility] on 02/16/14 with severe bilateral arm pain radiating to his chest while working at [employer]. Symptoms similar to previous angina. Ruled in with for NSTEMI with troponin of 2.72-->7.46-->3.86. Nonspecific EKG changes. Cath 02/17/14 with total occlusion of mRCA w/ L to R collats, continued patency of mLAD stent w/ nonobs LAD/diag stenosis, minor nonobs stenosis LCx, and moderate segmental LV systolic dysfunction.   1. NSTEMI/CAD: -Per cath report,  no indication to open occluded RCA as he as had prolonged pain, and there are now L to R collaterals with no residual chest pain. -He has been on Effient at home x 3 yrs, will keep him on this  -Check 2-D echo -Aspirin 81 mg, Toprol-XL 100 mg bid, Lipitor 80 mg, Imdur 60 mg, SL NTG prn   2. HTN: -Controlled -Continue current regimen   3. DM2: -SSI -Hold metformin 48 hours post cath   Signed, Eula Listen, PA-C As above, patient seen and examined. He denies chest pain or dyspnea. Right radial cath site with no hematoma. Cath results noted. Plan medical therapy. Continue aspirin, beta blocker and statin. He has been a nonresponder to Plavix previously. Resume effient. Await echocardiogram. Patient can be discharged later this evening. FU with Dr Antoine Poche 2 weeks in [place]. Patient's blood pressure is borderline. Discontinue Cozaar. Follow blood pressure at home and adjust regimen as needed. Hold Glucophage for 48 hours following catheterization. >30 min PA and physician time D2 [name]

## 2014-03-03 ENCOUNTER — Encounter: Payer: Self-pay | Admitting: Cardiology

## 2014-03-03 ENCOUNTER — Ambulatory Visit (INDEPENDENT_AMBULATORY_CARE_PROVIDER_SITE_OTHER): Payer: Medicare Other | Admitting: Cardiology

## 2014-03-03 VITALS — BP 154/80 | HR 121 | Ht 70.0 in | Wt 180.0 lb

## 2014-03-03 DIAGNOSIS — I701 Atherosclerosis of renal artery: Secondary | ICD-10-CM

## 2014-03-03 DIAGNOSIS — I214 Non-ST elevation (NSTEMI) myocardial infarction: Secondary | ICD-10-CM

## 2014-03-03 DIAGNOSIS — I2510 Atherosclerotic heart disease of native coronary artery without angina pectoris: Secondary | ICD-10-CM

## 2014-03-03 DIAGNOSIS — I739 Peripheral vascular disease, unspecified: Secondary | ICD-10-CM

## 2014-03-03 DIAGNOSIS — I200 Unstable angina: Secondary | ICD-10-CM

## 2014-03-03 MED ORDER — ISOSORBIDE MONONITRATE ER 120 MG PO TB24: 120.0000 mg | ORAL_TABLET | Freq: Every day | ORAL | Status: DC

## 2014-03-03 NOTE — Patient Instructions (Signed)
Please increase Imdur to 120 mg a day. Continue all other medications as listed.  Follow up in 6 weeks with Dr Antoine Poche.

## 2014-03-03 NOTE — Progress Notes (Signed)
HPI The patient presents for follow up of his CAD.  He was admitted 2 weeks ago with non-ST elevation MI. He was found to have an occluded right coronary artery but patent LAD stents. He was managed medically. Since that time he has not had any of the bilateral substernal chest pressure or shoulder discomfort that he was having acutely on that day. He denies any new shortness of breath, PND or orthopnea. He's had no palpitations, presyncope or syncope. He's had no weight gain or edema. However, he is more aware of some elbow discomfort that he is chronically. It is not like his previous angina. He's able to do activities without bringing on symptoms. She's not had any associated symptoms such as nausea vomiting or diaphoresis.  Allergies  Allergen Reactions  . Bee Venom Swelling  . Penicillins Itching    Current Outpatient Prescriptions  Medication Sig Dispense Refill  . allopurinol (ZYLOPRIM) 100 MG tablet Take 100 mg by mouth daily.        Marland Kitchen amLODipine (NORVASC) 10 MG tablet Take 1 tablet (10 mg total) by mouth daily.  90 tablet  3  . aspirin 81 MG tablet Take 81 mg by mouth daily.        Marland Kitchen atorvastatin (LIPITOR) 80 MG tablet Take 1 tablet (80 mg total) by mouth daily.  90 tablet  3  . dexlansoprazole (DEXILANT) 60 MG capsule Take 60 mg by mouth daily.        . fenofibrate (TRICOR) 145 MG tablet Take 1 tablet (145 mg total) by mouth daily.  90 tablet  3  . Fish Oil OIL Take 1 capsule by mouth daily.        . insulin aspart (NOVOLOG) 100 UNIT/ML injection Inject 10-12 Units into the skin 3 (three) times daily before meals. 90-150=10units, 150-200=11units, 200-250=12units      . isosorbide mononitrate (IMDUR) 60 MG 24 hr tablet Take 60 mg by mouth daily. TAKE 1 TABLET BY MOUTH EVERY DAY      . metFORMIN (GLUCOPHAGE) 500 MG tablet Take 1,000 mg by mouth 2 (two) times daily.      . metoprolol (TOPROL-XL) 200 MG 24 hr tablet Take 0.5 tablets (100 mg total) by mouth 2 (two) times daily.  90  tablet  3  . nitroGLYCERIN (NITROSTAT) 0.4 MG SL tablet Place 1 tablet (0.4 mg total) under the tongue every 5 (five) minutes as needed for chest pain.  25 tablet  11  . prasugrel (EFFIENT) 10 MG TABS tablet Take 10 mg by mouth daily. TAKE 1 TABLET EVERY DAY      . ramipril (ALTACE) 10 MG capsule Take 10 mg by mouth daily.       No current facility-administered medications for this visit.    Past Medical History  Diagnosis Date  . Coronary artery disease     a. Cath: 2000 nonobs dz. b. 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 s/p BMS to LAD & RCA ; s/p PCI/DES dRCA & PDA x2 2011. d. cath 02/17/14: LM patent, LAD calcified, diffuse irregs, no high grade stenosis mLAD stent patent, LCx, diffuse irregs no high-grade dz, RCA total occ w/ L to R collats, medical Rx rec  . PVD (peripheral vascular disease)     ABIs of 0.69 on right and 0.93 on left  . Hyperlipidemia   . Diabetes mellitus   . Hiatal hernia   . Sleep apnea     Mild  .  Renal artery stenosis     Mild, bilateral  . Anginal pain   . Diastolic dysfunction     a. echo 02/18/14: EF 45-50%, mild LVH, AK of inferior and inferoseptal myocardium, GR1DD    Past Surgical History  Procedure Laterality Date  . Soft tissue tumor resection      Vocal cord tumor, abdominal  . Hiatal hernia repair    . Soft tissue tumor resection      Benign neck tumor  . Shoulder arthroscopy      Right  . Cataract extraction    . Vocal cord polyp removal      ROS:  As stated in the HPI and negative for all other systems.  PHYSICAL EXAM BP 154/80  Pulse 121  Ht  (1.778 m)  Wt 180 lb (81.647 kg)  BMI 25.83 kg/m2 GENERAL:  Well appearing HEENT:  Pupils equal round and reactive, fundi not visualized, oral mucosa unremarkable, poor dentition NECK:  No jugular venous distention, waveform within normal limits, carotid upstroke brisk and symmetric, no bruits, no thyromegaly LUNGS:  Clear to auscultation  bilaterally BACK:  No CVA tenderness CHEST:  Unremarkable HEART:  PMI not displaced or sustained,S1 and S2 within normal limits, no S3, no S4, no clicks, no rubs, no murmurs ABD:  Flat, positive bowel sounds normal in frequency in pitch, no bruits, no rebound, no guarding, no midline pulsatile mass, no hepatomegaly, no splenomegaly EXT:  2 plus pulses upper and reduced femoral, popliteal, DP/PT. , no edema, no cyanosis no clubbing, absent DP bilateral but PT palpable.   ASSESSMENT AND PLAN  CAD:  I reviewed at length with him his coronary anatomy and plan. He has some atypical elbow pain which is not at all like his recent angina. Therefore, he will continue with medical management. I will go ahead and increase his Imdur to 120 mg. He knows to let he know if he has any worsening symptoms. Otherwise we will continue with risk reduction.  PVD:  His leg pain is stable. I encouraged continued walking.  DYSLIPIDEMIA:  I will defer to Dr. Lysbeth Galas.    DM:  I clarified his metformin dosing.  His discharge instructions were incorrect and he should not take this medication 4 x daily.

## 2014-03-31 ENCOUNTER — Ambulatory Visit: Payer: Medicare Other | Admitting: Cardiology

## 2014-04-14 ENCOUNTER — Ambulatory Visit: Payer: Medicare Other | Admitting: Cardiology

## 2014-04-28 ENCOUNTER — Encounter: Payer: Self-pay | Admitting: Cardiology

## 2014-04-28 ENCOUNTER — Ambulatory Visit (INDEPENDENT_AMBULATORY_CARE_PROVIDER_SITE_OTHER): Payer: Medicare Other | Admitting: Cardiology

## 2014-04-28 VITALS — BP 160/80 | HR 96 | Ht 70.0 in | Wt 184.0 lb

## 2014-04-28 DIAGNOSIS — M79606 Pain in leg, unspecified: Secondary | ICD-10-CM

## 2014-04-28 NOTE — Progress Notes (Signed)
HPI The patient presents for follow up of his CAD.  He was admitted in Sept with non-ST elevation MI. He was found to have an occluded right coronary artery but patent LAD stents. He was managed medically. At the last visit he was having some atypical elbow pain. I did increase his Imdur. He returns for followup of this. He returns for followup of this and says that he continues to have some occasional elbow discomfort but this is sporadic. Continues to be active and has been working on cars. He's not getting any of the chest discomfort that brought him to the hospital earlier this year. He's not describing any substernal chest pressure although he occasionally gets some burning.  He says that he could not afford Dexilant.  He does not have any new shortness of breath, PND or orthopnea. He's not describing any new palpitations, presyncope or syncope.  Allergies  Allergen Reactions  . Bee Venom Swelling  . Penicillins Itching    Current Outpatient Prescriptions  Medication Sig Dispense Refill  . allopurinol (ZYLOPRIM) 100 MG tablet Take 100 mg by mouth daily.      Marland Kitchen. amLODipine (NORVASC) 10 MG tablet Take 1 tablet (10 mg total) by mouth daily. 90 tablet 3  . aspirin 81 MG tablet Take 81 mg by mouth daily.      Marland Kitchen. atorvastatin (LIPITOR) 80 MG tablet Take 1 tablet (80 mg total) by mouth daily. 90 tablet 3  . fenofibrate (TRICOR) 145 MG tablet Take 1 tablet (145 mg total) by mouth daily. 90 tablet 3  . Fish Oil OIL Take 1 capsule by mouth daily.      . insulin aspart (NOVOLOG) 100 UNIT/ML injection Inject 10-12 Units into the skin 3 (three) times daily before meals. 90-150=10units, 150-200=11units, 200-250=12units    . isosorbide mononitrate (IMDUR) 120 MG 24 hr tablet Take 1 tablet (120 mg total) by mouth daily. TAKE 1 TABLET BY MOUTH EVERY DAY 30 tablet 11  . metFORMIN (GLUCOPHAGE) 500 MG tablet Take 1,000 mg by mouth 2 (two) times daily.    . metoprolol (TOPROL-XL) 200 MG 24 hr tablet Take 0.5  tablets (100 mg total) by mouth 2 (two) times daily. 90 tablet 3  . nitroGLYCERIN (NITROSTAT) 0.4 MG SL tablet Place 1 tablet (0.4 mg total) under the tongue every 5 (five) minutes as needed for chest pain. 25 tablet 11  . prasugrel (EFFIENT) 10 MG TABS tablet Take 10 mg by mouth daily. TAKE 1 TABLET EVERY DAY    . ramipril (ALTACE) 10 MG capsule Take 10 mg by mouth daily.     No current facility-administered medications for this visit.    Past Medical History  Diagnosis Date  . Coronary artery disease     a. Cath: 2000 nonobs dz. b. 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 s/p BMS to LAD & RCA ; s/p PCI/DES dRCA & PDA x2 2011. d. cath 02/17/14: LM patent, LAD calcified, diffuse irregs, no high grade stenosis mLAD stent patent, LCx, diffuse irregs no high-grade dz, RCA total occ w/ L to R collats, medical Rx rec  . PVD (peripheral vascular disease)     ABIs of 0.69 on right and 0.93 on left  . Hyperlipidemia   . Diabetes mellitus   . Hiatal hernia   . Sleep apnea     Mild  . Renal artery stenosis     Mild, bilateral  . Anginal pain   . Diastolic  dysfunction     a. echo 02/18/14: EF 45-50%, mild LVH, AK of inferior and inferoseptal myocardium, GR1DD    Past Surgical History  Procedure Laterality Date  . Soft tissue tumor resection      Vocal cord tumor, abdominal  . Hiatal hernia repair    . Soft tissue tumor resection      Benign neck tumor  . Shoulder arthroscopy      Right  . Cataract extraction    . Vocal cord polyp removal      ROS:  As stated in the HPI and negative for all other systems.  PHYSICAL EXAM BP 160/80 mmHg  Pulse 96  Ht 5\' 10"  (1.778 m)  Wt 184 lb (83.462 kg)  BMI 26.40 kg/m2 GENERAL:  Well appearing HEENT:  Pupils equal round and reactive, fundi not visualized, oral mucosa unremarkable, poor dentition NECK:  No jugular venous distention, waveform within normal limits, carotid upstroke brisk and symmetric, no bruits, no  thyromegaly LUNGS:  Clear to auscultation bilaterally BACK:  No CVA tenderness CHEST:  Unremarkable HEART:  PMI not displaced or sustained,S1 and S2 within normal limits, no S3, no S4, no clicks, no rubs, no murmurs ABD:  Flat, positive bowel sounds normal in frequency in pitch, no bruits, no rebound, no guarding, no midline pulsatile mass, no hepatomegaly, no splenomegaly EXT:  2 plus pulses upper and reduced femoral, popliteal, DP/PT, no edema, no cyanosis no clubbing.     ASSESSMENT AND PLAN  CAD:  He has had none of chest pain that was his previous angina. He still gets elbow pain but this seems to be more of an arthritic discomfort. No change in therapy is indicated.  HTN:  He did not take his meds yet today.   I am going to have him keep a blood pressure diary. He may need diuretic.  PVD:  He does continue to have leg pain in his have reduced ABIs. I will repeat these as it has been quite awhile.  DYSLIPIDEMIA:  I will defer to Dr. Lysbeth Galas.

## 2014-04-28 NOTE — Patient Instructions (Signed)
The current medical regimen is effective;  continue present plan and medications.  Please keep a blood pressure diary daily. Take your blood pressure while seated.  Bring or call us with the list of readings.  Your physician has requested that you have a lower extremity arterial exercise duplex. During this test, exercise and ultrasound are used to evaluate arterial blood flow in the legs. Allow one hour for this exam. There are no restrictions or special instructions.  Follow up in 4 months with Dr Antoine Poche in [place].

## 2014-05-03 ENCOUNTER — Ambulatory Visit (HOSPITAL_COMMUNITY): Payer: Medicare Other | Attending: Family Medicine | Admitting: Cardiology

## 2014-05-03 DIAGNOSIS — I2510 Atherosclerotic heart disease of native coronary artery without angina pectoris: Secondary | ICD-10-CM | POA: Diagnosis not present

## 2014-05-03 DIAGNOSIS — I10 Essential (primary) hypertension: Secondary | ICD-10-CM | POA: Diagnosis not present

## 2014-05-03 DIAGNOSIS — E119 Type 2 diabetes mellitus without complications: Secondary | ICD-10-CM | POA: Insufficient documentation

## 2014-05-03 DIAGNOSIS — J449 Chronic obstructive pulmonary disease, unspecified: Secondary | ICD-10-CM | POA: Insufficient documentation

## 2014-05-03 DIAGNOSIS — Z87891 Personal history of nicotine dependence: Secondary | ICD-10-CM | POA: Insufficient documentation

## 2014-05-03 DIAGNOSIS — M79606 Pain in leg, unspecified: Secondary | ICD-10-CM

## 2014-05-03 DIAGNOSIS — R0989 Other specified symptoms and signs involving the circulatory and respiratory systems: Secondary | ICD-10-CM

## 2014-05-03 DIAGNOSIS — I739 Peripheral vascular disease, unspecified: Secondary | ICD-10-CM | POA: Diagnosis present

## 2014-05-03 DIAGNOSIS — E785 Hyperlipidemia, unspecified: Secondary | ICD-10-CM | POA: Diagnosis not present

## 2014-05-03 NOTE — Progress Notes (Signed)
ABI performed  

## 2014-05-27 ENCOUNTER — Encounter (HOSPITAL_COMMUNITY): Payer: Self-pay | Admitting: Cardiovascular Disease

## 2014-07-07 ENCOUNTER — Other Ambulatory Visit: Payer: Self-pay | Admitting: Cardiology

## 2014-09-01 ENCOUNTER — Ambulatory Visit: Payer: Medicare Other | Admitting: Cardiology

## 2014-09-29 ENCOUNTER — Ambulatory Visit (INDEPENDENT_AMBULATORY_CARE_PROVIDER_SITE_OTHER): Payer: Medicare Other | Admitting: Cardiology

## 2014-09-29 ENCOUNTER — Encounter: Payer: Self-pay | Admitting: Cardiology

## 2014-09-29 VITALS — BP 96/62 | HR 110 | Ht 70.0 in | Wt 184.0 lb

## 2014-09-29 DIAGNOSIS — I2510 Atherosclerotic heart disease of native coronary artery without angina pectoris: Secondary | ICD-10-CM | POA: Diagnosis not present

## 2014-09-29 NOTE — Progress Notes (Signed)
HPI The patient presents for follow up of his CAD.  He was admitted in last Sept with non-ST elevation MI. He was found to have an occluded right coronary artery but patent LAD stents. He was managed medically.  Since I last saw him he's had no chest pain. He's not having any previous elbow pain that he was having. He does have stable exertional claudication. He is most bothered by joint problems and might need to have his left knee replaced. He's had injections of this. He denies any new shortness of breath, PND or orthopnea. He's had no weight gain or edema.  Allergies  Allergen Reactions  . Bee Venom Swelling  . Penicillins Itching    Current Outpatient Prescriptions  Medication Sig Dispense Refill  . allopurinol (ZYLOPRIM) 100 MG tablet Take 100 mg by mouth daily.      Marland Kitchen amLODipine (NORVASC) 10 MG tablet Take 1 tablet (10 mg total) by mouth daily. 90 tablet 3  . aspirin 81 MG tablet Take 81 mg by mouth daily.      Marland Kitchen atorvastatin (LIPITOR) 80 MG tablet Take 1 tablet (80 mg total) by mouth daily. 90 tablet 3  . fenofibrate (TRICOR) 145 MG tablet Take 1 tablet (145 mg total) by mouth daily. 90 tablet 3  . Fish Oil OIL Take 1 capsule by mouth daily.      . insulin aspart (NOVOLOG) 100 UNIT/ML injection Inject 10-12 Units into the skin 3 (three) times daily before meals. 90-150=10units, 150-200=11units, 200-250=12units    . isosorbide mononitrate (IMDUR) 120 MG 24 hr tablet Take 1 tablet (120 mg total) by mouth daily. TAKE 1 TABLET BY MOUTH EVERY DAY 30 tablet 11  . metFORMIN (GLUCOPHAGE) 500 MG tablet Take 1,000 mg by mouth 2 (two) times daily.    . metoprolol (TOPROL-XL) 200 MG 24 hr tablet TAKE 1/2 TABLET 2 TIMES A DAY 90 tablet 1  . nitroGLYCERIN (NITROSTAT) 0.4 MG SL tablet Place 1 tablet (0.4 mg total) under the tongue every 5 (five) minutes as needed for chest pain. 25 tablet 11  . prasugrel (EFFIENT) 10 MG TABS tablet Take 10 mg by mouth daily. TAKE 1 TABLET EVERY DAY    . ramipril  (ALTACE) 10 MG capsule Take 10 mg by mouth daily.     No current facility-administered medications for this visit.    Past Medical History  Diagnosis Date  . Coronary artery disease     a. Cath: 2000 nonobs dz. b. 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 s/p BMS to LAD & RCA ; s/p PCI/DES dRCA & PDA x2 2011. d. cath 02/17/14: LM patent, LAD calcified, diffuse irregs, no high grade stenosis mLAD stent patent, LCx, diffuse irregs no high-grade dz, RCA total occ w/ L to R collats, medical Rx rec  . PVD (peripheral vascular disease)     ABIs of 0.69 on right and 0.93 on left  . Hyperlipidemia   . Diabetes mellitus   . Hiatal hernia   . Sleep apnea     Mild  . Renal artery stenosis     Mild, bilateral  . Anginal pain   . Diastolic dysfunction     a. echo 02/18/14: EF 45-50%, mild LVH, AK of inferior and inferoseptal myocardium, GR1DD    Past Surgical History  Procedure Laterality Date  . Soft tissue tumor resection      Vocal cord tumor, abdominal  . Hiatal hernia repair    .  Soft tissue tumor resection      Benign neck tumor  . Shoulder arthroscopy      Right  . Cataract extraction    . Vocal cord polyp removal    . Left heart catheterization with coronary angiogram N/A 02/17/2014    Procedure: LEFT HEART CATHETERIZATION WITH CORONARY ANGIOGRAM;  Surgeon: Micheline Chapman, MD;  Location: [facility] CATH LAB;  Service: Cardiovascular;  Laterality: N/A;    ROS:  As stated in the HPI and negative for all other systems.  PHYSICAL EXAM BP 96/62 mmHg  Pulse 110  Ht 5\' 10"  (1.778 m)  Wt 184 lb (83.462 kg)  BMI 26.40 kg/m2 GENERAL:  Well appearing HEENT:  Pupils equal round and reactive, fundi not visualized, oral mucosa unremarkable, poor dentition NECK:  No jugular venous distention, waveform within normal limits, carotid upstroke brisk and symmetric, no bruits, no thyromegaly LUNGS:  Clear to auscultation bilaterally BACK:  No CVA tenderness CHEST:   Unremarkable HEART:  PMI not displaced or sustained,S1 and S2 within normal limits, no S3, no S4, no clicks, no rubs, no murmurs ABD:  Flat, positive bowel sounds normal in frequency in pitch, no bruits, no rebound, no guarding, no midline pulsatile mass, no hepatomegaly, no splenomegaly EXT:  2 plus pulses upper and reduced femoral, popliteal, DP/PT, no edema, no cyanosis no clubbing.    EKG:  Sinus rhythm, rate 110, axis within normal limits, intervals within normal limits, left ventricular hypertrophy 09/29/2014 with repolarization changes, No change from previous.  09/29/2014  ASSESSMENT AND PLAN  CAD:  He has had none of chest pain that was his previous angina. No change in therapy is indicated.  HTN:   His blood pressure is actually low today and his heart rate elevated. He will keep a blood pressure diary and heart rate recording and let me know if this doesn't correct. His blood pressure is typically not below. It's actually been high and I reviewed recent readings. He does say nervous today.  DM:  He says his sugar runs in the 260s typically. He was supposed to see an endocrinologist and he was reluctant to do this but I think he will and this is being set up by Josue Hector, MD  PVD:  He does continue to have leg pain. However, his ABIs were not severely reduced and he is not having any resting pain. He needs continued risk reduction.  DYSLIPIDEMIA:  I will defer to Dr. Lysbeth Galas.

## 2014-09-29 NOTE — Patient Instructions (Signed)
The current medical regimen is effective;  continue present plan and medications.  Follow up in 1 year with Dr. Hochrein in [place].  You will receive a letter in the mail 2 months before you are due.  Please call us when you receive this letter to schedule your follow up appointment.  Thank you for choosing [facility]!!     

## 2014-10-20 ENCOUNTER — Other Ambulatory Visit: Payer: Self-pay | Admitting: Cardiology

## 2014-10-20 NOTE — Telephone Encounter (Signed)
Rx(s) sent to pharmacy electronically.  

## 2014-10-29 ENCOUNTER — Other Ambulatory Visit: Payer: Self-pay

## 2014-10-29 MED ORDER — FENOFIBRATE 145 MG PO TABS: 145.0000 mg | ORAL_TABLET | Freq: Every day | ORAL | Status: DC

## 2015-02-09 ENCOUNTER — Ambulatory Visit (INDEPENDENT_AMBULATORY_CARE_PROVIDER_SITE_OTHER): Payer: Medicare Other | Admitting: Cardiology

## 2015-02-09 ENCOUNTER — Encounter: Payer: Self-pay | Admitting: Cardiology

## 2015-02-09 VITALS — BP 126/70 | HR 69 | Ht 70.0 in | Wt 185.0 lb

## 2015-02-09 DIAGNOSIS — I701 Atherosclerosis of renal artery: Secondary | ICD-10-CM

## 2015-02-09 DIAGNOSIS — I2510 Atherosclerotic heart disease of native coronary artery without angina pectoris: Secondary | ICD-10-CM

## 2015-02-09 NOTE — Patient Instructions (Signed)
Medication Instructions:  Your physician recommends that you continue on your current medications as directed. Please refer to the Current Medication list given to you today.  Follow-Up: Follow up in 6 months with Dr. Hochrein.  You will receive a letter in the mail 2 months before you are due.  Please call us when you receive this letter to schedule your follow up appointment.  Thank you for choosing [facility]!!       

## 2015-02-09 NOTE — Progress Notes (Signed)
HPI The patient presents for follow up of his CAD.  He was admitted last Sept with non-ST elevation MI. He was found to have an occluded right coronary artery but patent LAD stents. He was managed medically.  Since I last saw him he's had no chest pain. He's not having any previous elbow pain that he was having. He does have stable exertional claudication. He is planning to have knee surgery. He presented today for follow-up and to discuss preoperative clearance. He has had no new shortness of breath, PND or orthopnea. He's had no palpitations, presyncope or syncope.  Allergies  Allergen Reactions  . Bee Venom Swelling  . Penicillins Itching    Current Outpatient Prescriptions  Medication Sig Dispense Refill  . allopurinol (ZYLOPRIM) 100 MG tablet Take 100 mg by mouth daily.      Marland Kitchen amLODipine (NORVASC) 10 MG tablet Take 1 tablet (10 mg total) by mouth daily. 90 tablet 3  . aspirin 81 MG tablet Take 81 mg by mouth daily.      Marland Kitchen atorvastatin (LIPITOR) 80 MG tablet Take 1 tablet (80 mg total) by mouth daily. 90 tablet 3  . dexlansoprazole (DEXILANT) 60 MG capsule Take 60 mg by mouth daily.    . fenofibrate (TRICOR) 145 MG tablet Take 1 tablet (145 mg total) by mouth daily. 90 tablet 3  . Fish Oil OIL Take 1 capsule by mouth daily.      . hydrochlorothiazide (MICROZIDE) 12.5 MG capsule Take 12.5 mg by mouth daily.    . insulin aspart (NOVOLOG) 100 UNIT/ML injection Inject 10-12 Units into the skin 3 (three) times daily before meals. 90-150=10units, 150-200=11units, 200-250=12units    . isosorbide mononitrate (IMDUR) 120 MG 24 hr tablet Take 1 tablet (120 mg total) by mouth daily. TAKE 1 TABLET BY MOUTH EVERY DAY 30 tablet 11  . losartan (COZAAR) 100 MG tablet Take 100 mg by mouth daily.    . metFORMIN (GLUCOPHAGE) 500 MG tablet Take 1,000 mg by mouth 2 (two) times daily.    . metoprolol succinate (TOPROL-XL) 25 MG 24 hr tablet 25 mg. Take 1/2 tablet by mouth twice daily    . nitroGLYCERIN  (NITROSTAT) 0.4 MG SL tablet Place 1 tablet (0.4 mg total) under the tongue every 5 (five) minutes as needed for chest pain. 25 tablet 11  . prasugrel (EFFIENT) 10 MG TABS tablet Take 10 mg by mouth daily.     No current facility-administered medications for this visit.    Past Medical History  Diagnosis Date  . Coronary artery disease     a. Cath: 2000 nonobs dz. b. 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 s/p BMS to LAD & RCA ; s/p PCI/DES dRCA & PDA x2 2011. d. cath 02/17/14: LM patent, LAD calcified, diffuse irregs, no high grade stenosis mLAD stent patent, LCx, diffuse irregs no high-grade dz, RCA total occ w/ L to R collats, medical Rx rec  . PVD (peripheral vascular disease)     ABIs of 0.69 on right and 0.93 on left  . Hyperlipidemia   . Diabetes mellitus   . Hiatal hernia   . Sleep apnea     Mild  . Renal artery stenosis     Mild, bilateral  . Diastolic dysfunction     a. echo 02/18/14: EF 45-50%, mild LVH, AK of inferior and inferoseptal myocardium, GR1DD    Past Surgical History  Procedure Laterality Date  . Soft tissue tumor  resection      Vocal cord tumor, abdominal  . Hiatal hernia repair    . Soft tissue tumor resection      Benign neck tumor  . Shoulder arthroscopy      Right  . Cataract extraction    . Vocal cord polyp removal    . Left heart catheterization with coronary angiogram N/A 02/17/2014    Procedure: LEFT HEART CATHETERIZATION WITH CORONARY ANGIOGRAM;  Surgeon: Micheline Chapman, MD;  Location: [facility] CATH LAB;  Service: Cardiovascular;  Laterality: N/A;    ROS:  As stated in the HPI and negative for all other systems.  PHYSICAL EXAM BP 126/70 mmHg  Pulse 69  Ht 5\' 10"  (1.778 m)  Wt 185 lb (83.915 kg)  BMI 26.54 kg/m2 GENERAL:  Well appearing HEENT:  Pupils equal round and reactive, fundi not visualized, oral mucosa unremarkable, poor dentition NECK:  No jugular venous distention, waveform within normal limits, carotid  upstroke brisk and symmetric, no bruits, no thyromegaly LUNGS:  Clear to auscultation bilaterally BACK:  No CVA tenderness CHEST:  Unremarkable HEART:  PMI not displaced or sustained,S1 and S2 within normal limits, no S3, no S4, no clicks, no rubs, no murmurs ABD:  Flat, positive bowel sounds normal in frequency in pitch, no bruits, no rebound, no guarding, no midline pulsatile mass, no hepatomegaly, no splenomegaly EXT:  2 plus pulses upper and reduced femoral, popliteal, DP/PT, no edema, no cyanosis no clubbing.    EKG:  Sinus rhythm, rate 69, axis within normal limits, intervals within normal limits, left ventricular hypertrophy  with repolarization changes, Rate is slower than previous  02/09/2015  ASSESSMENT AND PLAN  CAD:  He has had none of chest pain that was his previous angina. No change in therapy is indicated.  Of note he was started on Effient after his most recent event as part of his medical management. It is thought that he has not responded to Plavix in the past  HTN:   The blood pressure is at target. No change in medications is indicated. We will continue with therapeutic lifestyle changes (TLC).  DM:  Per Dr. Lysbeth Galas  PVD:  He has had leg pain in the past.  However, his ABIs were not severely reduced and he is not having any resting pain. He needs continued risk reduction.  DYSLIPIDEMIA:  I will defer to Dr. Lysbeth Galas.    PREOP:  The patient is to have knee surgery. He would be at acceptable risk for the planned procedure based on ACC/AHA guidelines. He would need to hold his Effient most likely for 7 days prior and this would be fine. I would like to continue the aspirin.

## 2015-03-02 ENCOUNTER — Telehealth: Payer: Self-pay | Admitting: Cardiology

## 2015-03-02 NOTE — Telephone Encounter (Signed)
New message     Pt wanted to let you know that Dr Dub Mikes office faxed over a surgical clearance form today

## 2015-03-02 NOTE — Telephone Encounter (Signed)
Last office visit note that includes clearance was faxed to Dr Dub Mikes office via fax.  Pt is aware and will let them know.  If further questions or concerns they will contact us.

## 2015-03-02 NOTE — Telephone Encounter (Signed)
I haven't seen a form from them at this point.  Pt aware.

## 2015-03-08 ENCOUNTER — Telehealth: Payer: Self-pay | Admitting: *Deleted

## 2015-03-08 NOTE — Telephone Encounter (Signed)
Dr Antoine Poche office note was faxed to [facility], Surgery Clearance

## 2015-04-10 ENCOUNTER — Other Ambulatory Visit: Payer: Self-pay | Admitting: Cardiology

## 2015-05-31 ENCOUNTER — Other Ambulatory Visit: Payer: Self-pay | Admitting: Cardiology

## 2015-05-31 NOTE — Telephone Encounter (Signed)
Rx request sent to pharmacy.  

## 2015-09-07 ENCOUNTER — Ambulatory Visit (INDEPENDENT_AMBULATORY_CARE_PROVIDER_SITE_OTHER): Payer: Medicare Other | Admitting: Cardiology

## 2015-09-07 ENCOUNTER — Encounter: Payer: Self-pay | Admitting: Cardiology

## 2015-09-07 VITALS — BP 120/70 | HR 100 | Ht 70.0 in | Wt 199.0 lb

## 2015-09-07 DIAGNOSIS — R0989 Other specified symptoms and signs involving the circulatory and respiratory systems: Secondary | ICD-10-CM

## 2015-09-07 DIAGNOSIS — I739 Peripheral vascular disease, unspecified: Secondary | ICD-10-CM

## 2015-09-07 DIAGNOSIS — I2510 Atherosclerotic heart disease of native coronary artery without angina pectoris: Secondary | ICD-10-CM | POA: Diagnosis not present

## 2015-09-07 NOTE — Patient Instructions (Signed)
Medication Instructions:  Please stop your Effient.  Continue all other medications as listed.  Testing/Procedures: Your physician has requested that you have a carotid duplex. This test is an ultrasound of the carotid arteries in your neck. It looks at blood flow through these arteries that supply the brain with blood. Allow one hour for this exam. There are no restrictions or special instructions.  Follow-Up: Follow up in 1 year with Dr. Antoine Poche.  You will receive a letter in the mail 2 months before you are due.  Please call us when you receive this letter to schedule your follow up appointment.  If you need a refill on your cardiac medications before your next appointment, please call your pharmacy.  Thank you for choosing [facility]!!

## 2015-09-07 NOTE — Progress Notes (Signed)
HPI The patient presents for follow up of his CAD.  He was admitted Sept 2015 with non-ST elevation MI. He was found to have an occluded right coronary artery but patent LAD stents. He was managed medically.  Since I last saw him he's had no chest pain. He rarely gets some pain in his arms he's not sure whether this is similar to previous angina but it is uncommon and only happens when he is lifting things at work. He never feels like he needs to take a nitroglycerin. He says it's a stable pattern. He's not having any substernal chest pressure or neck discomfort. He does have stable exertional claudication. He had knee surgery and did well with this . He has had no new shortness of breath, PND or orthopnea. He's had no palpitations, presyncope or syncope.  Allergies  Allergen Reactions  . Bee Venom Swelling  . Penicillins Itching    Current Outpatient Prescriptions  Medication Sig Dispense Refill  . allopurinol (ZYLOPRIM) 100 MG tablet Take 100 mg by mouth daily.      Marland Kitchen amLODipine (NORVASC) 10 MG tablet Take 1 tablet (10 mg total) by mouth daily. 90 tablet 3  . aspirin 81 MG tablet Take 81 mg by mouth daily.      Marland Kitchen atorvastatin (LIPITOR) 40 MG tablet Take 40 mg by mouth daily.    Marland Kitchen dexlansoprazole (DEXILANT) 60 MG capsule Take 60 mg by mouth daily.    . fenofibrate (TRICOR) 145 MG tablet Take 1 tablet (145 mg total) by mouth daily. 90 tablet 3  . Fish Oil OIL Take 1 capsule by mouth daily.      . hydrochlorothiazide (MICROZIDE) 12.5 MG capsule Take 12.5 mg by mouth daily.    . insulin aspart (NOVOLOG) 100 UNIT/ML injection Inject 10-12 Units into the skin 3 (three) times daily before meals. 90-150=10units, 150-200=11units, 200-250=12units    . isosorbide mononitrate (IMDUR) 120 MG 24 hr tablet TAKE 1 TABLET BY MOUTH EVERY DAY 30 tablet 5  . losartan (COZAAR) 100 MG tablet Take 100 mg by mouth daily.    . metFORMIN (GLUCOPHAGE) 500 MG tablet Take 1,000 mg by mouth 2 (two) times daily.    .  metoprolol (TOPROL-XL) 200 MG 24 hr tablet 200 mg. Take 1/2 tablet by mouth twice daily    . nitroGLYCERIN (NITROSTAT) 0.4 MG SL tablet Place 1 tablet (0.4 mg total) under the tongue every 5 (five) minutes as needed for chest pain. 25 tablet 11  . prasugrel (EFFIENT) 10 MG TABS tablet Take 10 mg by mouth daily.     No current facility-administered medications for this visit.    Past Medical History  Diagnosis Date  . Coronary artery disease     a. Cath: 2000 nonobs dz. b. 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 s/p BMS to LAD & RCA ; s/p PCI/DES dRCA & PDA x2 2011. d. cath 02/17/14: LM patent, LAD calcified, diffuse irregs, no high grade stenosis mLAD stent patent, LCx, diffuse irregs no high-grade dz, RCA total occ w/ L to R collats, medical Rx rec  . PVD (peripheral vascular disease) (HCC)     ABIs of 0.69 on right and 0.93 on left  . Hyperlipidemia   . Diabetes mellitus   . Hiatal hernia   . Sleep apnea     Mild  . Renal artery stenosis (HCC)     Mild, bilateral  . Diastolic dysfunction     a.  echo 02/18/14: EF 45-50%, mild LVH, AK of inferior and inferoseptal myocardium, GR1DD    Past Surgical History  Procedure Laterality Date  . Soft tissue tumor resection      Vocal cord tumor, abdominal  . Hiatal hernia repair    . Soft tissue tumor resection      Benign neck tumor  . Shoulder arthroscopy      Right  . Cataract extraction    . Vocal cord polyp removal    . Left heart catheterization with coronary angiogram N/A 02/17/2014    Procedure: LEFT HEART CATHETERIZATION WITH CORONARY ANGIOGRAM;  Surgeon: Micheline Chapman, MD;  Location: [facility] CATH LAB;  Service: Cardiovascular;  Laterality: N/A;    ROS:  As stated in the HPI and negative for all other systems.  PHYSICAL EXAM BP 120/70 mmHg  Pulse 100  Ht 5\' 10"  (1.778 m)  Wt 199 lb (90.266 kg)  BMI 28.55 kg/m2 GENERAL:  Well appearing HEENT:  Pupils equal round and reactive, fundi not visualized,  oral mucosa unremarkable, poor dentition NECK:  No jugular venous distention, waveform within normal limits, carotid upstroke brisk and symmetric, slight right sided bruit , no thyromegaly LUNGS:  Clear to auscultation bilaterally BACK:  No CVA tenderness CHEST:  Unremarkable HEART:  PMI not displaced or sustained,S1 and S2 within normal limits, no S3, no S4, no clicks, no rubs, no murmurs ABD:  Flat, positive bowel sounds normal in frequency in pitch, no bruits, no rebound, no guarding, no midline pulsatile mass, no hepatomegaly, no splenomegaly EXT:  2 plus pulses upper and reduced femoral, popliteal, DP/PT, no edema, no cyanosis no clubbing.    EKG:  Sinus rhythm, rate 87, axis within normal limits, intervals within normal limits, left ventricular hypertrophy  with repolarization changes, Rate is slower than previous  09/07/2015  ASSESSMENT AND PLAN  CAD:  He is having no ongoing symptoms.  He can stop the Effient anc continue ASA.  He has been a non responder to Plavix.    HTN:   The blood pressure is at target. No change in medications is indicated. We will continue with therapeutic lifestyle changes (TLC).  DM:  Per Dr Shawnee Knapp (endocrinology).   PVD:  He has had leg pain in the past.  However, his ABIs were not severely reduced and he is not having any resting pain. He needs continued risk reduction.  DYSLIPIDEMIA:  I will defer to Dr. Lysbeth Galas.  Current FDA suggestions state that we should not use fenofibrate and sent together unless we are treating severe hypertriglyceridemia. There is no advantage if the intent was to raise HDL alone  BRUIT I will get carotid Dopplers:

## 2015-09-12 ENCOUNTER — Ambulatory Visit (HOSPITAL_COMMUNITY)
Admission: RE | Admit: 2015-09-12 | Discharge: 2015-09-12 | Disposition: A | Discharge status: Home / Self Care | Payer: Medicare Other | Source: Ambulatory Visit | Attending: Cardiovascular Disease | Admitting: Cardiovascular Disease

## 2015-09-12 DIAGNOSIS — I6523 Occlusion and stenosis of bilateral carotid arteries: Secondary | ICD-10-CM | POA: Insufficient documentation

## 2015-09-12 DIAGNOSIS — R0989 Other specified symptoms and signs involving the circulatory and respiratory systems: Secondary | ICD-10-CM

## 2015-09-12 DIAGNOSIS — E119 Type 2 diabetes mellitus without complications: Secondary | ICD-10-CM | POA: Insufficient documentation

## 2015-09-12 DIAGNOSIS — G4730 Sleep apnea, unspecified: Secondary | ICD-10-CM | POA: Insufficient documentation

## 2015-09-12 DIAGNOSIS — E785 Hyperlipidemia, unspecified: Secondary | ICD-10-CM | POA: Diagnosis not present

## 2015-09-12 DIAGNOSIS — I2510 Atherosclerotic heart disease of native coronary artery without angina pectoris: Secondary | ICD-10-CM | POA: Insufficient documentation

## 2015-09-12 DIAGNOSIS — I739 Peripheral vascular disease, unspecified: Secondary | ICD-10-CM | POA: Diagnosis not present

## 2015-09-16 ENCOUNTER — Telehealth: Payer: Self-pay | Admitting: Cardiology

## 2015-09-16 NOTE — Telephone Encounter (Signed)
Attempted to contact pt however he had gone to pick the kids up from school.  Will call him back later today.

## 2015-09-16 NOTE — Telephone Encounter (Signed)
Forward to [employer] RN

## 2015-09-16 NOTE — Telephone Encounter (Signed)
Left message to c/b for results. °

## 2015-09-16 NOTE — Telephone Encounter (Signed)
Follow Up ° °Pt returning call from earlier. Please call. °

## 2015-09-16 NOTE — Telephone Encounter (Signed)
Results of carotid doppler reviewed with pt who states understanding.

## 2016-01-24 ENCOUNTER — Other Ambulatory Visit: Payer: Self-pay | Admitting: Cardiology

## 2016-01-24 NOTE — Telephone Encounter (Signed)
Rx has been sent to the pharmacy electronically. ° °

## 2016-01-30 DIAGNOSIS — K219 Gastro-esophageal reflux disease without esophagitis: Secondary | ICD-10-CM | POA: Insufficient documentation

## 2016-05-25 ENCOUNTER — Other Ambulatory Visit: Payer: Self-pay | Admitting: Cardiology

## 2016-05-25 NOTE — Telephone Encounter (Signed)
Rx(s) sent to pharmacy electronically.  

## 2016-12-01 ENCOUNTER — Other Ambulatory Visit: Payer: Self-pay | Admitting: Cardiology

## 2017-03-27 ENCOUNTER — Other Ambulatory Visit: Payer: Self-pay | Admitting: Cardiology

## 2017-03-31 NOTE — Progress Notes (Signed)
HPI The patient presents for follow up of his CAD.  He was admitted Sept 2015 with non-ST elevation MI. He was found to have an occluded right coronary artery but patent LAD stents. He was managed medically.  Since I last saw him he's done relatively well. He stays very busy. He takes care of her great grandchild when the parents are working. This 46-month-old. He takes care of his granddaughters every other week.  He works at [employer].  The patient denies any new symptoms such as chest discomfort, neck or arm discomfort. There has been no new shortness of breath, PND or orthopnea. There have been no reported palpitations, presyncope or syncope.  He has stable claudication.  He walks, gets pain, stops recovers and walks some more.  The patient denies any new symptoms such as chest discomfort, neck or arm discomfort. There has been no new shortness of breath, PND or orthopnea. There have been no reported palpitations, presyncope or syncope.  He does have some chronic DOE that might be slightly progressive but no acute complaints.      Allergies  Allergen Reactions  . Bee Venom Swelling  . Penicillins Itching    Current Outpatient Prescriptions  Medication Sig Dispense Refill  . allopurinol (ZYLOPRIM) 100 MG tablet Take 100 mg by mouth daily.      Marland Kitchen amLODipine (NORVASC) 10 MG tablet Take 1 tablet (10 mg total) by mouth daily. 90 tablet 3  . aspirin 81 MG tablet Take 81 mg by mouth daily.      Marland Kitchen atorvastatin (LIPITOR) 40 MG tablet Take 40 mg by mouth daily.    . fenofibrate (TRICOR) 145 MG tablet TAKE 1 TABLET (145 MG TOTAL) BY MOUTH DAILY. 30 tablet 0  . Fish Oil OIL Take 1 capsule by mouth daily.      . hydrochlorothiazide (MICROZIDE) 12.5 MG capsule Take 12.5 mg by mouth daily.    . insulin NPH-regular Human (NOVOLIN 70/30) (70-30) 100 UNIT/ML injection Inject 48 Units into the skin 2 (two) times daily.    . isosorbide mononitrate (IMDUR) 120 MG 24 hr tablet Take 1  tablet (120 mg total) by mouth daily. 90 tablet 3  . losartan (COZAAR) 100 MG tablet Take 100 mg by mouth daily.    . metFORMIN (GLUCOPHAGE) 500 MG tablet Take 1,000 mg by mouth 2 (two) times daily.    . metoprolol (TOPROL-XL) 200 MG 24 hr tablet Take 0.5 tablets (100 mg total) by mouth 2 (two) times daily. 90 tablet 3  . nitroGLYCERIN (NITROSTAT) 0.4 MG SL tablet Place 1 tablet (0.4 mg total) under the tongue every 5 (five) minutes as needed for chest pain. 25 tablet 11  . omeprazole (PRILOSEC) 20 MG capsule Take 20 mg by mouth daily.     No current facility-administered medications for this visit.     Past Medical History:  Diagnosis Date  . Coronary artery disease    a. Cath: 2000 nonobs dz. b. 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 s/p BMS to LAD & RCA ; s/p PCI/DES dRCA & PDA x2 2011. d. cath 02/17/14: LM patent, LAD calcified, diffuse irregs, no high grade stenosis mLAD stent patent, LCx, diffuse irregs no high-grade dz, RCA total occ w/ L to R collats, medical Rx rec  . Diabetes mellitus   . Diastolic dysfunction    a. echo 02/18/14: EF 45-50%, mild LVH, AK of inferior and inferoseptal myocardium, GR1DD  .  Hiatal hernia   . Hyperlipidemia   . PVD (peripheral vascular disease) (HCC)    ABIs of 0.69 on right and 0.93 on left  . Renal artery stenosis (HCC)    Mild, bilateral  . Sleep apnea    Mild    Past Surgical History:  Procedure Laterality Date  . CATARACT EXTRACTION    . HIATAL HERNIA REPAIR    . LEFT HEART CATHETERIZATION WITH CORONARY ANGIOGRAM N/A 02/17/2014   Procedure: LEFT HEART CATHETERIZATION WITH CORONARY ANGIOGRAM;  Surgeon: Micheline Chapman, MD;  Location: [facility] CATH LAB;  Service: Cardiovascular;  Laterality: N/A;  . SHOULDER ARTHROSCOPY     Right  . 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 SOFT TISSUE TUMOR RESECTION     Benign neck tumor  . Vocal cord polyp removal      ROS:  As stated in the HPI and  negative for all other systems.   PHYSICAL EXAM BP 130/84   Pulse (!) 104   Ht 5\' 10"  (1.778 m)   Wt 198 lb 3.2 oz (89.9 kg)   BMI 28.44 kg/m   GENERAL:  Well appearing NECK:  No jugular venous distention, waveform within normal limits, carotid upstroke brisk and symmetric, no bruits, no thyromegaly LUNGS:  Clear to auscultation bilaterally CHEST:  Unremarkable HEART:  PMI not displaced or sustained,S1 and S2 within normal limits, no S3, no S4, no clicks, no rubs, no murmurs ABD:  Flat, positive bowel sounds normal in frequency in pitch, no bruits, no rebound, no guarding, no midline pulsatile mass, no hepatomegaly, no splenomegaly EXT:  2 plus pulses upper decreased DP/PT bilateral, no ulcers, no edema, no cyanosis no clubbing, right femoral bruit.    EKG:  Sinus rhythm, rate 104 , axis within normal limits, intervals within normal limits, left ventricular hypertrophy  with repolarization changes, unchanged from previous.   04/02/2017  ASSESSMENT AND PLAN  CAD:  He has some chronic symptoms and did have obstructive disease that we are going to manage medically.  He has had false negative Lexiscan Myoview in the past and would need cath if he has symptoms in the future.  He has been a non responder to Plavix.  Of note he has no new symptoms that would suggest that he needs a cardiac cath.    HTN:   The blood pressure is at target. No change in medications is indicated. We will continue with therapeutic lifestyle changes (TLC).  DM:    Per Dr Shawnee Knapp (endocrinology).  I will defer to him as to whether he would start an SGLT2 inhibitor.  A1C was 8.6  PVD: He has stable claudication.  He does not want to start Pletal.  I prescribed walking. We talked about the need to look at the bottoms of his feet routinely and to practice good foot hygiene.    DYSLIPIDEMIA:   He has elevated triglycerides.  He is on meds as above.  I will defer to [name], MD.  The patient understands the need  for better BP control.   BRUIT:  He had mild carotid plaque last year.  No imaging is indicated at this time.

## 2017-04-01 ENCOUNTER — Ambulatory Visit (INDEPENDENT_AMBULATORY_CARE_PROVIDER_SITE_OTHER): Payer: Medicare Other | Admitting: Cardiology

## 2017-04-01 ENCOUNTER — Encounter: Payer: Self-pay | Admitting: Cardiology

## 2017-04-01 VITALS — BP 130/84 | HR 104 | Ht 70.0 in | Wt 198.2 lb

## 2017-04-01 DIAGNOSIS — I2510 Atherosclerotic heart disease of native coronary artery without angina pectoris: Secondary | ICD-10-CM | POA: Diagnosis not present

## 2017-04-01 DIAGNOSIS — I10 Essential (primary) hypertension: Secondary | ICD-10-CM

## 2017-04-01 DIAGNOSIS — I739 Peripheral vascular disease, unspecified: Secondary | ICD-10-CM

## 2017-04-01 DIAGNOSIS — R0989 Other specified symptoms and signs involving the circulatory and respiratory systems: Secondary | ICD-10-CM

## 2017-04-01 DIAGNOSIS — E785 Hyperlipidemia, unspecified: Secondary | ICD-10-CM

## 2017-04-01 MED ORDER — ISOSORBIDE MONONITRATE ER 120 MG PO TB24: 120.0000 mg | ORAL_TABLET | Freq: Every day | ORAL | 3 refills | Status: DC

## 2017-04-01 MED ORDER — METOPROLOL SUCCINATE ER 200 MG PO TB24: 100.0000 mg | ORAL_TABLET | Freq: Two times a day (BID) | ORAL | 3 refills | Status: DC

## 2017-04-01 NOTE — Patient Instructions (Signed)
Medication Instructions:  Continue current medications  If you need a refill on your cardiac medications before your next appointment, please call your pharmacy.  Labwork: None Ordered   Testing/Procedures: None Ordered  Follow-Up: Your physician wants you to follow-up in: 1 Year. You should receive a reminder letter in the mail two months in advance. If you do not receive a letter, please call our office [phone].    Thank you for choosing [facility]!!      

## 2017-04-02 ENCOUNTER — Encounter: Payer: Self-pay | Admitting: Cardiology

## 2017-04-02 DIAGNOSIS — E785 Hyperlipidemia, unspecified: Secondary | ICD-10-CM | POA: Insufficient documentation

## 2017-04-02 DIAGNOSIS — R0989 Other specified symptoms and signs involving the circulatory and respiratory systems: Secondary | ICD-10-CM | POA: Insufficient documentation

## 2017-04-02 DIAGNOSIS — I10 Essential (primary) hypertension: Secondary | ICD-10-CM | POA: Insufficient documentation

## 2017-05-03 ENCOUNTER — Other Ambulatory Visit: Payer: Self-pay | Admitting: Cardiology

## 2017-05-03 NOTE — Telephone Encounter (Signed)
Rx(s) sent to pharmacy electronically.  

## 2017-05-14 DIAGNOSIS — J0100 Acute maxillary sinusitis, unspecified: Secondary | ICD-10-CM | POA: Insufficient documentation

## 2017-05-14 DIAGNOSIS — R6889 Other general symptoms and signs: Secondary | ICD-10-CM | POA: Insufficient documentation

## 2017-07-02 ENCOUNTER — Other Ambulatory Visit: Payer: Self-pay

## 2017-08-24 ENCOUNTER — Other Ambulatory Visit: Payer: Self-pay | Admitting: Cardiology

## 2017-10-07 ENCOUNTER — Other Ambulatory Visit: Payer: Self-pay

## 2017-10-07 ENCOUNTER — Emergency Department (HOSPITAL_COMMUNITY): Payer: Medicare Other

## 2017-10-07 ENCOUNTER — Inpatient Hospital Stay (HOSPITAL_COMMUNITY): Payer: Medicare Other

## 2017-10-07 ENCOUNTER — Encounter (HOSPITAL_COMMUNITY): Payer: Self-pay

## 2017-10-07 ENCOUNTER — Inpatient Hospital Stay (HOSPITAL_COMMUNITY)
Admission: EM | Admit: 2017-10-07 | Discharge: 2017-10-11 | DRG: 246 | Disposition: A | Discharge status: Home / Self Care | Payer: Medicare Other | Attending: Cardiology | Admitting: Cardiology

## 2017-10-07 DIAGNOSIS — J189 Pneumonia, unspecified organism: Secondary | ICD-10-CM | POA: Diagnosis present

## 2017-10-07 DIAGNOSIS — I25119 Atherosclerotic heart disease of native coronary artery with unspecified angina pectoris: Secondary | ICD-10-CM | POA: Diagnosis present

## 2017-10-07 DIAGNOSIS — E782 Mixed hyperlipidemia: Secondary | ICD-10-CM | POA: Diagnosis present

## 2017-10-07 DIAGNOSIS — E119 Type 2 diabetes mellitus without complications: Secondary | ICD-10-CM | POA: Diagnosis not present

## 2017-10-07 DIAGNOSIS — J9601 Acute respiratory failure with hypoxia: Secondary | ICD-10-CM | POA: Diagnosis present

## 2017-10-07 DIAGNOSIS — J9801 Acute bronchospasm: Secondary | ICD-10-CM | POA: Diagnosis present

## 2017-10-07 DIAGNOSIS — E1165 Type 2 diabetes mellitus with hyperglycemia: Secondary | ICD-10-CM | POA: Diagnosis present

## 2017-10-07 DIAGNOSIS — G4733 Obstructive sleep apnea (adult) (pediatric): Secondary | ICD-10-CM | POA: Diagnosis present

## 2017-10-07 DIAGNOSIS — I509 Heart failure, unspecified: Secondary | ICD-10-CM

## 2017-10-07 DIAGNOSIS — Z79899 Other long term (current) drug therapy: Secondary | ICD-10-CM | POA: Diagnosis not present

## 2017-10-07 DIAGNOSIS — Z955 Presence of coronary angioplasty implant and graft: Secondary | ICD-10-CM

## 2017-10-07 DIAGNOSIS — E785 Hyperlipidemia, unspecified: Secondary | ICD-10-CM

## 2017-10-07 DIAGNOSIS — E1151 Type 2 diabetes mellitus with diabetic peripheral angiopathy without gangrene: Secondary | ICD-10-CM | POA: Diagnosis present

## 2017-10-07 DIAGNOSIS — T82855A Stenosis of coronary artery stent, initial encounter: Secondary | ICD-10-CM | POA: Diagnosis present

## 2017-10-07 DIAGNOSIS — I2510 Atherosclerotic heart disease of native coronary artery without angina pectoris: Secondary | ICD-10-CM | POA: Diagnosis not present

## 2017-10-07 DIAGNOSIS — Z7982 Long term (current) use of aspirin: Secondary | ICD-10-CM | POA: Diagnosis not present

## 2017-10-07 DIAGNOSIS — I739 Peripheral vascular disease, unspecified: Secondary | ICD-10-CM | POA: Diagnosis present

## 2017-10-07 DIAGNOSIS — Z87891 Personal history of nicotine dependence: Secondary | ICD-10-CM | POA: Diagnosis not present

## 2017-10-07 DIAGNOSIS — Z794 Long term (current) use of insulin: Secondary | ICD-10-CM | POA: Diagnosis not present

## 2017-10-07 DIAGNOSIS — Y831 Surgical operation with implant of artificial internal device as the cause of abnormal reaction of the patient, or of later complication, without mention of misadventure at the time of the procedure: Secondary | ICD-10-CM | POA: Diagnosis present

## 2017-10-07 DIAGNOSIS — I701 Atherosclerosis of renal artery: Secondary | ICD-10-CM | POA: Diagnosis present

## 2017-10-07 DIAGNOSIS — I5043 Acute on chronic combined systolic (congestive) and diastolic (congestive) heart failure: Secondary | ICD-10-CM | POA: Diagnosis present

## 2017-10-07 DIAGNOSIS — K219 Gastro-esophageal reflux disease without esophagitis: Secondary | ICD-10-CM | POA: Diagnosis present

## 2017-10-07 DIAGNOSIS — Q245 Malformation of coronary vessels: Secondary | ICD-10-CM

## 2017-10-07 DIAGNOSIS — E876 Hypokalemia: Secondary | ICD-10-CM | POA: Diagnosis present

## 2017-10-07 DIAGNOSIS — I110 Hypertensive heart disease with heart failure: Secondary | ICD-10-CM | POA: Diagnosis present

## 2017-10-07 DIAGNOSIS — J811 Chronic pulmonary edema: Secondary | ICD-10-CM | POA: Diagnosis present

## 2017-10-07 DIAGNOSIS — I214 Non-ST elevation (NSTEMI) myocardial infarction: Secondary | ICD-10-CM | POA: Diagnosis present

## 2017-10-07 DIAGNOSIS — R000 Tachycardia, unspecified: Secondary | ICD-10-CM | POA: Diagnosis not present

## 2017-10-07 DIAGNOSIS — J810 Acute pulmonary edema: Secondary | ICD-10-CM | POA: Diagnosis present

## 2017-10-07 DIAGNOSIS — I10 Essential (primary) hypertension: Secondary | ICD-10-CM | POA: Diagnosis present

## 2017-10-07 DIAGNOSIS — Z95820 Peripheral vascular angioplasty status with implants and grafts: Secondary | ICD-10-CM

## 2017-10-07 HISTORY — DX: Pneumonia, unspecified organism: J18.9

## 2017-10-07 HISTORY — DX: Non-ST elevation (NSTEMI) myocardial infarction: I21.4

## 2017-10-07 HISTORY — DX: Heart disease, unspecified: I51.9

## 2017-10-07 HISTORY — DX: Peripheral vascular angioplasty status with implants and grafts: Z95.820

## 2017-10-07 LAB — BASIC METABOLIC PANEL WITH GFR
Anion gap: 15 (ref 5–15)
BUN: 17 mg/dL (ref 6–20)
CO2: 22 mmol/L (ref 22–32)
Calcium: 9 mg/dL (ref 8.9–10.3)
Chloride: 104 mmol/L (ref 101–111)
Creatinine, Ser: 0.54 mg/dL — ABNORMAL LOW (ref 0.61–1.24)
GFR calc Af Amer: >60 mL/min
GFR calc non Af Amer: >60 mL/min
Glucose, Bld: 410 mg/dL — ABNORMAL HIGH (ref 65–99)
Potassium: 3.2 mmol/L — ABNORMAL LOW (ref 3.5–5.1)
Sodium: 141 mmol/L (ref 135–145)

## 2017-10-07 LAB — I-STAT TROPONIN, ED: Troponin i, poc: 0.01 ng/mL (ref 0.00–0.08)

## 2017-10-07 LAB — CBC WITH DIFFERENTIAL/PLATELET
Basophils Absolute: 0.1 K/uL (ref 0.0–0.1)
Basophils Relative: 0 %
Eosinophils Absolute: 0.2 K/uL (ref 0.0–0.7)
Eosinophils Relative: 1 %
HCT: 49.1 % (ref 39.0–52.0)
Hemoglobin: 16.6 g/dL (ref 13.0–17.0)
Lymphocytes Relative: 33 %
Lymphs Abs: 5.3 K/uL — ABNORMAL HIGH (ref 0.7–4.0)
MCH: 28.5 pg (ref 26.0–34.0)
MCHC: 33.8 g/dL (ref 30.0–36.0)
MCV: 84.4 fL (ref 78.0–100.0)
Monocytes Absolute: 0.6 K/uL (ref 0.1–1.0)
Monocytes Relative: 4 %
Neutro Abs: 10.2 K/uL — ABNORMAL HIGH (ref 1.7–7.7)
Neutrophils Relative %: 62 %
Platelets: 217 K/uL (ref 150–400)
RBC: 5.82 MIL/uL — ABNORMAL HIGH (ref 4.22–5.81)
RDW: 13.6 % (ref 11.5–15.5)
WBC: 16.3 K/uL — ABNORMAL HIGH (ref 4.0–10.5)

## 2017-10-07 LAB — GLUCOSE, CAPILLARY
GLUCOSE-CAPILLARY: 153 mg/dL — AB (ref 65–99)
GLUCOSE-CAPILLARY: 184 mg/dL — AB (ref 65–99)
GLUCOSE-CAPILLARY: 227 mg/dL — AB (ref 65–99)
GLUCOSE-CAPILLARY: 317 mg/dL — AB (ref 65–99)
Glucose-Capillary: 282 mg/dL — ABNORMAL HIGH (ref 65–99)

## 2017-10-07 LAB — BLOOD GAS, ARTERIAL
Acid-base deficit: 3 mmol/L — ABNORMAL HIGH (ref 0.0–2.0)
Bicarbonate: 21.5 mmol/L (ref 20.0–28.0)
DRAWN BY: 105551
FIO2: 100
O2 SAT: 95.4 %
pCO2 arterial: 42.6 mmHg (ref 32.0–48.0)
pH, Arterial: 7.333 — ABNORMAL LOW (ref 7.350–7.450)
pO2, Arterial: 92 mmHg (ref 83.0–108.0)

## 2017-10-07 LAB — URINALYSIS, ROUTINE W REFLEX MICROSCOPIC
BILIRUBIN URINE: NEGATIVE
Ketones, ur: NEGATIVE mg/dL
LEUKOCYTES UA: NEGATIVE
Nitrite: NEGATIVE
SQUAMOUS EPITHELIAL / LPF: NONE SEEN
Specific Gravity, Urine: 1.014 (ref 1.005–1.030)
pH: 7 (ref 5.0–8.0)

## 2017-10-07 LAB — I-STAT CHEM 8, ED
BUN: 16 mg/dL (ref 6–20)
Calcium, Ion: 1.11 mmol/L — ABNORMAL LOW (ref 1.15–1.40)
Chloride: 101 mmol/L (ref 101–111)
Creatinine, Ser: 0.5 mg/dL — ABNORMAL LOW (ref 0.61–1.24)
Glucose, Bld: 396 mg/dL — ABNORMAL HIGH (ref 65–99)
HCT: 46 % (ref 39.0–52.0)
Hemoglobin: 15.6 g/dL (ref 13.0–17.0)
Potassium: 3 mmol/L — ABNORMAL LOW (ref 3.5–5.1)
Sodium: 140 mmol/L (ref 135–145)
TCO2: 22 mmol/L (ref 22–32)

## 2017-10-07 LAB — CBG MONITORING, ED
GLUCOSE-CAPILLARY: 465 mg/dL — AB (ref 65–99)
Glucose-Capillary: 475 mg/dL — ABNORMAL HIGH (ref 65–99)

## 2017-10-07 LAB — TROPONIN I
TROPONIN I: 0.2 ng/mL — AB (ref <0.03)
Troponin I: <0.03 ng/mL (ref <0.03)
Troponin I: 1.01 ng/mL (ref <0.03)
Troponin I: 1.02 ng/mL (ref <0.03)

## 2017-10-07 LAB — ECHOCARDIOGRAM COMPLETE
HEIGHTINCHES: 70 in
WEIGHTICAEL: 3088 [oz_av]

## 2017-10-07 LAB — D-DIMER, QUANTITATIVE: D-Dimer, Quant: 0.5 ug{FEU}/mL (ref 0.00–0.50)

## 2017-10-07 LAB — BRAIN NATRIURETIC PEPTIDE: B Natriuretic Peptide: 136 pg/mL — ABNORMAL HIGH (ref 0.0–100.0)

## 2017-10-07 LAB — MAGNESIUM: MAGNESIUM: 1.4 mg/dL — AB (ref 1.7–2.4)

## 2017-10-07 LAB — MRSA PCR SCREENING: MRSA BY PCR: NEGATIVE

## 2017-10-07 MED ORDER — POTASSIUM CHLORIDE 10 MEQ/100ML IV SOLN
10.0000 meq | INTRAVENOUS | Status: AC
  Administered 2017-10-07: 10 meq via INTRAVENOUS
  Filled 2017-10-07: qty 100

## 2017-10-07 MED ORDER — FUROSEMIDE 10 MG/ML IJ SOLN
60.0000 mg | Freq: Once | INTRAMUSCULAR | Status: AC
  Administered 2017-10-07: 60 mg via INTRAVENOUS
  Filled 2017-10-07: qty 6

## 2017-10-07 MED ORDER — LEVOFLOXACIN IN D5W 750 MG/150ML IV SOLN
750.0000 mg | Freq: Once | INTRAVENOUS | Status: AC
  Administered 2017-10-07: 750 mg via INTRAVENOUS
  Filled 2017-10-07: qty 150

## 2017-10-07 MED ORDER — ASPIRIN 81 MG PO CHEW
324.0000 mg | CHEWABLE_TABLET | Freq: Once | ORAL | Status: AC
  Administered 2017-10-07: 324 mg via ORAL
  Filled 2017-10-07: qty 4

## 2017-10-07 MED ORDER — PANTOPRAZOLE SODIUM 40 MG PO TBEC
40.0000 mg | DELAYED_RELEASE_TABLET | Freq: Every day | ORAL | Status: DC
  Administered 2017-10-07 – 2017-10-11 (×4): 40 mg via ORAL
  Filled 2017-10-07 (×4): qty 1

## 2017-10-07 MED ORDER — INSULIN ASPART 100 UNIT/ML ~~LOC~~ SOLN
0.0000 [IU] | SUBCUTANEOUS | Status: DC
  Administered 2017-10-07: 7 [IU] via SUBCUTANEOUS
  Administered 2017-10-07: 11 [IU] via SUBCUTANEOUS
  Administered 2017-10-07: 15 [IU] via SUBCUTANEOUS
  Administered 2017-10-07 – 2017-10-08 (×3): 4 [IU] via SUBCUTANEOUS
  Administered 2017-10-08: 20 [IU] via SUBCUTANEOUS
  Administered 2017-10-08: 4 [IU] via SUBCUTANEOUS
  Administered 2017-10-08: 3 [IU] via SUBCUTANEOUS
  Administered 2017-10-08 – 2017-10-09 (×2): 4 [IU] via SUBCUTANEOUS
  Administered 2017-10-09 (×3): 3 [IU] via SUBCUTANEOUS
  Administered 2017-10-10 (×5): 4 [IU] via SUBCUTANEOUS

## 2017-10-07 MED ORDER — POTASSIUM CHLORIDE CRYS ER 20 MEQ PO TBCR
40.0000 meq | EXTENDED_RELEASE_TABLET | ORAL | Status: AC
  Administered 2017-10-07 (×3): 40 meq via ORAL
  Filled 2017-10-07 (×3): qty 2

## 2017-10-07 MED ORDER — ONDANSETRON HCL 4 MG/2ML IJ SOLN: 4.0000 mg | Freq: Four times a day (QID) | INTRAMUSCULAR | Status: DC | PRN

## 2017-10-07 MED ORDER — IPRATROPIUM BROMIDE 0.02 % IN SOLN
0.5000 mg | Freq: Once | RESPIRATORY_TRACT | Status: AC
  Administered 2017-10-07: 0.5 mg via RESPIRATORY_TRACT
  Filled 2017-10-07: qty 2.5

## 2017-10-07 MED ORDER — SODIUM CHLORIDE 0.9% FLUSH
3.0000 mL | Freq: Two times a day (BID) | INTRAVENOUS | Status: DC
  Administered 2017-10-07 – 2017-10-09 (×5): 3 mL via INTRAVENOUS

## 2017-10-07 MED ORDER — NITROGLYCERIN IN D5W 200-5 MCG/ML-% IV SOLN
5.0000 ug/min | Freq: Once | INTRAVENOUS | Status: AC
  Administered 2017-10-07: 5 ug/min via INTRAVENOUS
  Filled 2017-10-07: qty 250

## 2017-10-07 MED ORDER — MAGNESIUM SULFATE 2 GM/50ML IV SOLN
2.0000 g | Freq: Once | INTRAVENOUS | Status: AC
  Administered 2017-10-07: 2 g via INTRAVENOUS
  Filled 2017-10-07: qty 50

## 2017-10-07 MED ORDER — ALBUTEROL (5 MG/ML) CONTINUOUS INHALATION SOLN
10.0000 mg/h | INHALATION_SOLUTION | Freq: Once | RESPIRATORY_TRACT | Status: AC
  Administered 2017-10-07: 10 mg/h via RESPIRATORY_TRACT
  Filled 2017-10-07: qty 20

## 2017-10-07 MED ORDER — ONDANSETRON HCL 4 MG PO TABS: 4.0000 mg | ORAL_TABLET | Freq: Four times a day (QID) | ORAL | Status: DC | PRN

## 2017-10-07 MED ORDER — LEVOFLOXACIN IN D5W 750 MG/150ML IV SOLN: 750.0000 mg | Freq: Once | INTRAVENOUS | Status: DC

## 2017-10-07 MED ORDER — AMLODIPINE BESYLATE 10 MG PO TABS
10.0000 mg | ORAL_TABLET | Freq: Every day | ORAL | Status: DC
  Administered 2017-10-07 – 2017-10-11 (×4): 10 mg via ORAL
  Filled 2017-10-07: qty 1
  Filled 2017-10-07 (×3): qty 2

## 2017-10-07 MED ORDER — HEPARIN BOLUS VIA INFUSION
4000.0000 [IU] | Freq: Once | INTRAVENOUS | Status: AC
  Administered 2017-10-07: 4000 [IU] via INTRAVENOUS
  Filled 2017-10-07: qty 4000

## 2017-10-07 MED ORDER — INSULIN ASPART PROT & ASPART (70-30 MIX) 100 UNIT/ML ~~LOC~~ SUSP
50.0000 [IU] | Freq: Two times a day (BID) | SUBCUTANEOUS | Status: DC
  Administered 2017-10-07 – 2017-10-11 (×5): 50 [IU] via SUBCUTANEOUS
  Filled 2017-10-07 (×2): qty 10

## 2017-10-07 MED ORDER — INSULIN ASPART 100 UNIT/ML ~~LOC~~ SOLN: 10.0000 [IU] | Freq: Once | SUBCUTANEOUS | Status: DC

## 2017-10-07 MED ORDER — IOPAMIDOL (ISOVUE-370) INJECTION 76%
100.0000 mL | Freq: Once | INTRAVENOUS | Status: AC | PRN
  Administered 2017-10-07: 100 mL via INTRAVENOUS

## 2017-10-07 MED ORDER — ATORVASTATIN CALCIUM 40 MG PO TABS
40.0000 mg | ORAL_TABLET | Freq: Every day | ORAL | Status: DC
  Administered 2017-10-07 – 2017-10-09 (×3): 40 mg via ORAL
  Filled 2017-10-07 (×3): qty 1

## 2017-10-07 MED ORDER — SODIUM CHLORIDE 0.9% FLUSH: 3.0000 mL | INTRAVENOUS | Status: DC | PRN

## 2017-10-07 MED ORDER — NITROGLYCERIN IN D5W 200-5 MCG/ML-% IV SOLN: 5.0000 ug/min | INTRAVENOUS | Status: DC

## 2017-10-07 MED ORDER — SODIUM CHLORIDE 0.9 % IV SOLN: 250.0000 mL | INTRAVENOUS | Status: DC | PRN

## 2017-10-07 MED ORDER — METHYLPREDNISOLONE SODIUM SUCC 125 MG IJ SOLR
125.0000 mg | Freq: Once | INTRAMUSCULAR | Status: AC
  Administered 2017-10-07: 125 mg via INTRAVENOUS
  Filled 2017-10-07: qty 2

## 2017-10-07 MED ORDER — FUROSEMIDE 10 MG/ML IJ SOLN
40.0000 mg | Freq: Two times a day (BID) | INTRAMUSCULAR | Status: DC
Start: 2017-10-07 — End: 2017-10-08
  Administered 2017-10-07 – 2017-10-08 (×2): 40 mg via INTRAVENOUS
  Filled 2017-10-07 (×2): qty 4

## 2017-10-07 MED ORDER — METFORMIN HCL 500 MG PO TABS
1000.0000 mg | ORAL_TABLET | Freq: Two times a day (BID) | ORAL | Status: DC
  Administered 2017-10-07 – 2017-10-08 (×3): 1000 mg via ORAL
  Filled 2017-10-07 (×3): qty 2

## 2017-10-07 MED ORDER — HEPARIN (PORCINE) IN NACL 100-0.45 UNIT/ML-% IJ SOLN
1650.0000 [IU]/h | INTRAMUSCULAR | Status: DC
Start: 2017-10-07 — End: 2017-10-09
  Administered 2017-10-07 – 2017-10-08 (×2): 1100 [IU]/h via INTRAVENOUS
  Administered 2017-10-08: 1650 [IU]/h via INTRAVENOUS
  Filled 2017-10-07 (×3): qty 250

## 2017-10-07 MED ORDER — LOSARTAN POTASSIUM 50 MG PO TABS
100.0000 mg | ORAL_TABLET | Freq: Every day | ORAL | Status: DC
  Administered 2017-10-07 – 2017-10-11 (×4): 100 mg via ORAL
  Filled 2017-10-07: qty 4
  Filled 2017-10-07: qty 2
  Filled 2017-10-07 (×2): qty 4

## 2017-10-07 MED ORDER — ALLOPURINOL 100 MG PO TABS
100.0000 mg | ORAL_TABLET | Freq: Every day | ORAL | Status: DC
  Administered 2017-10-07 – 2017-10-11 (×4): 100 mg via ORAL
  Filled 2017-10-07 (×4): qty 1

## 2017-10-07 MED ORDER — ACETAMINOPHEN 325 MG PO TABS
650.0000 mg | ORAL_TABLET | Freq: Four times a day (QID) | ORAL | Status: DC | PRN
  Administered 2017-10-10: 14:00:00 650 mg via ORAL
  Filled 2017-10-07: qty 2

## 2017-10-07 MED ORDER — ACETAMINOPHEN 650 MG RE SUPP: 650.0000 mg | Freq: Four times a day (QID) | RECTAL | Status: DC | PRN

## 2017-10-07 MED ORDER — ASPIRIN 81 MG PO CHEW
81.0000 mg | CHEWABLE_TABLET | Freq: Every day | ORAL | Status: DC
  Administered 2017-10-07 – 2017-10-11 (×4): 81 mg via ORAL
  Filled 2017-10-07 (×4): qty 1

## 2017-10-07 MED ORDER — METOPROLOL SUCCINATE ER 50 MG PO TB24
100.0000 mg | ORAL_TABLET | Freq: Two times a day (BID) | ORAL | Status: DC
  Administered 2017-10-07 – 2017-10-11 (×8): 100 mg via ORAL
  Filled 2017-10-07 (×2): qty 1
  Filled 2017-10-07 (×2): qty 2
  Filled 2017-10-07 (×2): qty 1
  Filled 2017-10-07 (×5): qty 2
  Filled 2017-10-07: qty 1
  Filled 2017-10-07: qty 2

## 2017-10-07 MED ORDER — INSULIN ASPART 100 UNIT/ML ~~LOC~~ SOLN
25.0000 [IU] | Freq: Once | SUBCUTANEOUS | Status: AC
  Administered 2017-10-07: 25 [IU] via SUBCUTANEOUS
  Filled 2017-10-07: qty 1

## 2017-10-07 MED ORDER — ISOSORBIDE MONONITRATE ER 60 MG PO TB24
120.0000 mg | ORAL_TABLET | Freq: Every day | ORAL | Status: DC
  Administered 2017-10-07 – 2017-10-11 (×4): 120 mg via ORAL
  Filled 2017-10-07 (×4): qty 2

## 2017-10-07 NOTE — Progress Notes (Signed)
*  PRELIMINARY RESULTS* Echocardiogram 2D Echocardiogram has been performed.  [name] [name] 10/07/2017, 1:06 PM

## 2017-10-07 NOTE — Progress Notes (Signed)
Took patient off BIPAP and placed on 4LNC. HR-119 RR-13 sp02 95%. Patient in no distress. BIPAP on standby at bedside if needed. RT will continue to monitor.

## 2017-10-07 NOTE — ED Notes (Signed)
Pt unable to tolerate Potassium at 20ml/hr, decreased to 40ml/hr, pt still unable to tolerate, decreased to 65ml/hr, notified EDP-verbal order to stop

## 2017-10-07 NOTE — Progress Notes (Signed)
ANTICOAGULATION CONSULT NOTE - Initial Consult  Pharmacy Consult for HEPARIN Indication: chest pain/ACS  Allergies  Allergen Reactions  . Bee Venom Swelling  . Penicillins Itching   Patient Measurements: Height: 5\' 10"  (177.8 cm) Weight: 193 lb (87.5 kg) IBW/kg (Calculated) : 73 HEPARIN DW (KG): 87.5  Vital Signs: Temp: 97.7 F (36.5 C) (04/22 0333) Temp Source: Oral (04/22 0333) BP: 126/73 (04/22 0615) Pulse Rate: 109 (04/22 0852)  Labs: Recent Labs    10/07/17 0326 10/07/17 0352 10/07/17 0607  HGB 16.6 15.6  --   HCT 49.1 46.0  --   PLT 217  --   --   CREATININE 0.54* 0.50*  --   TROPONINI <0.03  --  0.20*   Estimated Creatinine Clearance: 90 mL/min (A) (by C-G formula based on SCr of 0.5 mg/dL (L)).  Medical History: Past Medical History:  Diagnosis Date  . Coronary artery disease    a. Cath: 2000 nonobs dz. b. 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 s/p BMS to LAD & RCA ; s/p PCI/DES dRCA & PDA x2 2011. d. cath 02/17/14: LM patent, LAD calcified, diffuse irregs, no high grade stenosis mLAD stent patent, LCx, diffuse irregs no high-grade dz, RCA total occ w/ L to R collats, medical Rx rec  . Diabetes mellitus   . Diastolic dysfunction    a. echo 02/18/14: EF 45-50%, mild LVH, AK of inferior and inferoseptal myocardium, GR1DD  . Hiatal hernia   . Hyperlipidemia   . PVD (peripheral vascular disease) (HCC)    ABIs of 0.69 on right and 0.93 on left  . Renal artery stenosis (HCC)    Mild, bilateral  . Sleep apnea    Mild   Medications:  Medications Prior to Admission  Medication Sig Dispense Refill Last Dose  . allopurinol (ZYLOPRIM) 100 MG tablet Take 100 mg by mouth daily.     10/06/2017 at Unknown time  . amLODipine (NORVASC) 10 MG tablet Take 1 tablet (10 mg total) by mouth daily. 90 tablet 3 10/06/2017 at Unknown time  . aspirin 81 MG tablet Take 81 mg by mouth daily.     10/06/2017 at Unknown time  . fenofibrate (TRICOR) 145 MG  tablet TAKE 1 TABLET BY MOUTH EVERY DAY 30 tablet 11 10/06/2017 at Unknown time  . Fish Oil OIL Take 1 capsule by mouth daily.     Past Month at Unknown time  . hydrochlorothiazide (MICROZIDE) 12.5 MG capsule Take 12.5 mg by mouth daily.   10/06/2017 at Unknown time  . insulin NPH-regular Human (NOVOLIN 70/30) (70-30) 100 UNIT/ML injection Inject 48 Units into the skin 2 (two) times daily.   10/06/2017 at Unknown time  . isosorbide mononitrate (IMDUR) 120 MG 24 hr tablet Take 1 tablet (120 mg total) by mouth daily. 90 tablet 3 10/06/2017 at Unknown time  . losartan (COZAAR) 100 MG tablet Take 100 mg by mouth daily.   10/06/2017 at Unknown time  . metFORMIN (GLUCOPHAGE) 500 MG tablet Take 1,000 mg by mouth 2 (two) times daily.   10/06/2017 at Unknown time  . metoprolol (TOPROL-XL) 200 MG 24 hr tablet Take 0.5 tablets (100 mg total) by mouth 2 (two) times daily. 90 tablet 3 10/06/2017 at Unknown time  . metoprolol tartrate (LOPRESSOR) 100 MG tablet TAKE 1 TABLET BY MOUTH TWICE A DAY 60 tablet 5 10/06/2017 at 0300  . omeprazole (PRILOSEC) 20 MG capsule Take 20 mg by mouth daily.   10/06/2017 at  Unknown time  . atorvastatin (LIPITOR) 40 MG tablet Take 40 mg by mouth daily.   More than a month at Unknown time  . nitroGLYCERIN (NITROSTAT) 0.4 MG SL tablet Place 1 tablet (0.4 mg total) under the tongue every 5 (five) minutes as needed for chest pain. 25 tablet 11 More than a month at Unknown time    Assessment: 72yo male CAD status post previous PCI to the RCA and LAD with most recent cardiac catheterization in 2015 showing occlusion of the mid RCA associated with left to right collaterals, patent mid LAD stent with nonobstructive LAD/diagonal stenosis, and mild atherosclerosis.  Pharmacy asked to start Heparin.  Goal of Therapy:  Heparin level 0.3-0.7 units/ml Monitor platelets by anticoagulation protocol: Yes   Plan:  Heparin 4000 units x 1 Heparin infusion at 1100 units/hr Heparin level in 6-8 hrs then  daily CBC daily while on Heparin  Margo Aye, Niara Bunker A 10/07/2017,10:10 AM

## 2017-10-07 NOTE — H&P (Addendum)
History and Physical    [name] [name] ZOX:[id number] DOB: [birth year] DOA: 10/07/2017  PCP: [name] Catching, MD   Patient coming from: Home  Chief Complaint: Dyspnea  HPI: [name] [name] is a 72 y.o. male with medical history significant for diabetes type 2, dyslipidemia, hypertension, CAD with prior NSTEMI and prior stent, and PVD who presented to the emergency department via EMS after he experienced sudden onset shortness of breath that awoke him from sleep.  He was noted to be very anxious and on initial evaluation had oxygen saturation in the 90th percentile which was noted to be in the 70th percentile upon arrival to the ED.  He denies any chest pain, nausea, vomiting, diaphoresis, cough, fever, or chills.  He states that he was otherwise feeling well when he laid down to go to bed.  He denies any lower extremity edema or recent weight gain.   ED Course: Vital signs are stable, but demonstrates significant tachycardia in the 120-140 beat per minute range.  He is noted to have pulmonary edema on one view chest x-ray and EKG demonstrates sinus tachycardia at 144 bpm with some concern for ST depression in leads V2 and V3.  A posterior EKG was also performed with no significant findings.  Laboratory data with glucose 396, leukocytosis of 16,000, magnesium 1.4, potassium 3, d-dimer 0.5, and BNP 136.  Initial troponin 0.01.  Urinalysis with glucose and protein noted.  ED physician has discussed with cardiology regarding findings of possible STEMI and they state that findings could be related to some hypokalemia and in the face of no chest pain and negative troponin, this was unlikely to be ACS.  They recommend cycling of troponins and further monitoring at this time with diuresis.  Patient has been started on Lasix diuresis as well as nitroglycerin drip and heparin drip.  He was initially given some steroids as well as breathing treatments due to some bronchospasms which appear to have resolved at this  time.  He is currently on BiPAP with FiO2 50%.  Review of Systems: Cannot be fully obtained due to ongoing respiratory distress on BiPAP.  Past Medical History:  Diagnosis Date  . Coronary artery disease    a. Cath: 2000 nonobs dz. b. 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 s/p BMS to LAD & RCA ; s/p PCI/DES dRCA & PDA x2 2011. d. cath 02/17/14: LM patent, LAD calcified, diffuse irregs, no high grade stenosis mLAD stent patent, LCx, diffuse irregs no high-grade dz, RCA total occ w/ L to R collats, medical Rx rec  . Diabetes mellitus   . Diastolic dysfunction    a. echo 02/18/14: EF 45-50%, mild LVH, AK of inferior and inferoseptal myocardium, GR1DD  . Hiatal hernia   . Hyperlipidemia   . PVD (peripheral vascular disease) (HCC)    ABIs of 0.69 on right and 0.93 on left  . Renal artery stenosis (HCC)    Mild, bilateral  . Sleep apnea    Mild    Past Surgical History:  Procedure Laterality Date  . CATARACT EXTRACTION    . HIATAL HERNIA REPAIR    . LEFT HEART CATHETERIZATION WITH CORONARY ANGIOGRAM N/A 02/17/2014   Procedure: LEFT HEART CATHETERIZATION WITH CORONARY ANGIOGRAM;  Surgeon: Micheline Chapman, MD;  Location: [facility] CATH LAB;  Service: Cardiovascular;  Laterality: N/A;  . SHOULDER ARTHROSCOPY     Right  . 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  SOFT TISSUE TUMOR RESECTION     Benign neck tumor  . Vocal cord polyp removal       reports that he quit smoking about 14 years ago. He has never used smokeless tobacco. He reports that he does not drink alcohol or use drugs.  Allergies  Allergen Reactions  . Bee Venom Swelling  . Penicillins Itching    Family History  Problem Relation Age of Onset  . Heart attack Father 7  . Cancer Mother   . Cancer Brother     Prior to Admission medications   Medication Sig Start Date End Date Taking? Authorizing Provider  allopurinol (ZYLOPRIM) 100 MG tablet Take 100 mg by mouth daily.      Yes [provider]  amLODipine (NORVASC) 10 MG tablet Take 1 tablet (10 mg total) by mouth daily. 09/24/12  Yes Rollene Rotunda, MD  aspirin 81 MG tablet Take 81 mg by mouth daily.     Yes [provider]  fenofibrate (TRICOR) 145 MG tablet TAKE 1 TABLET BY MOUTH EVERY DAY 05/03/17  Yes Rollene Rotunda, MD  hydrochlorothiazide (MICROZIDE) 12.5 MG capsule Take 12.5 mg by mouth daily.   Yes [provider]  insulin NPH-regular Human (NOVOLIN 70/30) (70-30) 100 UNIT/ML injection Inject 48 Units into the skin 2 (two) times daily.   Yes [provider]  isosorbide mononitrate (IMDUR) 120 MG 24 hr tablet Take 1 tablet (120 mg total) by mouth daily. 04/01/17  Yes Rollene Rotunda, MD  losartan (COZAAR) 100 MG tablet Take 100 mg by mouth daily.   Yes [provider]  metFORMIN (GLUCOPHAGE) 500 MG tablet Take 1,000 mg by mouth 2 (two) times daily. 02/19/14  Yes Dunn, Raymon Mutton, PA-C  metoprolol (TOPROL-XL) 200 MG 24 hr tablet Take 0.5 tablets (100 mg total) by mouth 2 (two) times daily. 04/01/17  Yes Rollene Rotunda, MD  omeprazole (PRILOSEC) 20 MG capsule Take 20 mg by mouth daily.   Yes [provider]  atorvastatin (LIPITOR) 40 MG tablet Take 40 mg by mouth daily.    [provider]  Fish Oil OIL Take 1 capsule by mouth daily.      [provider]  metoprolol tartrate (LOPRESSOR) 100 MG tablet TAKE 1 TABLET BY MOUTH TWICE A DAY 08/26/17   Rollene Rotunda, MD  nitroGLYCERIN (NITROSTAT) 0.4 MG SL tablet Place 1 tablet (0.4 mg total) under the tongue every 5 (five) minutes as needed for chest pain. 10/14/13   Rollene Rotunda, MD    Physical Exam: Vitals:   10/07/17 0500 10/07/17 0505 10/07/17 0510 10/07/17 0515  BP: (!) 182/90 (!) 199/107 (!) 155/81 140/77  Pulse: (!) 127 (!) 138 (!) 122 (!) 127  Resp: 17 (!) 22 15 (!) 21  Temp:      TempSrc:      SpO2: 97% 98% 98% 95%  Weight:      Height:        Constitutional: NAD, calm,  comfortable Vitals:   10/07/17 0500 10/07/17 0505 10/07/17 0510 10/07/17 0515  BP: (!) 182/90 (!) 199/107 (!) 155/81 140/77  Pulse: (!) 127 (!) 138 (!) 122 (!) 127  Resp: 17 (!) 22 15 (!) 21  Temp:      TempSrc:      SpO2: 97% 98% 98% 95%  Weight:      Height:       Eyes: lids and conjunctivae normal ENMT: Mucous membranes are moist.  Neck: normal, supple Respiratory: clear to auscultation bilaterally. Normal respiratory  effort. No accessory muscle use.  Cardiovascular: Regular rate and rhythm, no murmurs. No extremity edema. Abdomen: no tenderness, no distention. Bowel sounds positive.  Musculoskeletal:  No joint deformity upper and lower extremities.   Skin: no rashes, lesions, ulcers.  Psychiatric: Normal judgment and insight. Alert and oriented x 3. Normal mood.   Labs on Admission: I have personally reviewed following labs and imaging studies  CBC: Recent Labs  Lab 10/07/17 0326 10/07/17 0352  WBC 16.3*  --   NEUTROABS 10.2*  --   HGB 16.6 15.6  HCT 49.1 46.0  MCV 84.4  --   PLT 217  --    Basic Metabolic Panel: Recent Labs  Lab 10/07/17 0326 10/07/17 0352  NA 141 140  K 3.2* 3.0*  CL 104 101  CO2 22  --   GLUCOSE 410* 396*  BUN 17 16  CREATININE 0.54* 0.50*  CALCIUM 9.0  --   MG 1.4*  --    GFR: Estimated Creatinine Clearance: 90 mL/min (A) (by C-G formula based on SCr of 0.5 mg/dL (L)). Liver Function Tests: No results for input(s): AST, ALT, ALKPHOS, BILITOT, PROT, ALBUMIN in the last 168 hours. No results for input(s): LIPASE, AMYLASE in the last 168 hours. No results for input(s): AMMONIA in the last 168 hours. Coagulation Profile: No results for input(s): INR, PROTIME in the last 168 hours. Cardiac Enzymes: Recent Labs  Lab 10/07/17 0326  TROPONINI <0.03   BNP (last 3 results) No results for input(s): PROBNP in the last 8760 hours. HbA1C: No results for input(s): HGBA1C in the last 72 hours. CBG: No results for input(s): GLUCAP in the  last 168 hours. Lipid Profile: No results for input(s): CHOL, HDL, LDLCALC, TRIG, CHOLHDL, LDLDIRECT in the last 72 hours. Thyroid Function Tests: No results for input(s): TSH, T4TOTAL, FREET4, T3FREE, THYROIDAB in the last 72 hours. Anemia Panel: No results for input(s): VITAMINB12, FOLATE, FERRITIN, TIBC, IRON, RETICCTPCT in the last 72 hours. Urine analysis:    Component Value Date/Time   COLORURINE YELLOW 10/07/2017 0406   APPEARANCEUR CLEAR 10/07/2017 0406   LABSPEC 1.014 10/07/2017 0406   PHURINE 7.0 10/07/2017 0406   GLUCOSEU >=500 (A) 10/07/2017 0406   HGBUR SMALL (A) 10/07/2017 0406   BILIRUBINUR NEGATIVE 10/07/2017 0406   KETONESUR NEGATIVE 10/07/2017 0406   PROTEINUR >=300 (A) 10/07/2017 0406   UROBILINOGEN 0.2 06/12/2010 1605   NITRITE NEGATIVE 10/07/2017 0406   LEUKOCYTESUR NEGATIVE 10/07/2017 0406    Radiological Exams on Admission: Dg Chest Portable 1 View  Result Date: 10/07/2017 CLINICAL DATA:  Acute onset of shortness of breath. EXAM: PORTABLE CHEST 1 VIEW COMPARISON:  Chest radiograph performed 02/16/2014 FINDINGS: The lungs are well-aerated. Vascular congestion is noted. Diffuse increased interstitial markings raise concern for pulmonary edema. There is no evidence of pleural effusion or pneumothorax. The cardiomediastinal silhouette is within normal limits. No acute osseous abnormalities are seen. IMPRESSION: Vascular congestion. Diffusely increased interstitial markings raise concern for pulmonary edema. Electronically Signed   By: Roanna Raider M.D.   On: 10/07/2017 04:17    EKG: Independently reviewed. ST 144bpm with ST depressions in V2/V3.  Assessment/Plan Principal Problem:   Acute hypoxemic respiratory failure (HCC) Active Problems:   Type 2 diabetes mellitus without complication (HCC)   Coronary atherosclerosis   PVD   Essential hypertension   Dyslipidemia    1. Acute hypoxemic respiratory failure secondary to flash pulmonary edema.  Patient  has history significant for PND signifying heart failure which is the most likely  cause in this instance.  I suspect worsening ischemic cardiomyopathy with prior history of CAD.  Will obtain 2D echocardiogram to further evaluate and cycle troponins.  Additionally, will rule out PE with CT chest with contrast due to persistence of hypoxemia despite some adequate diuresis thus far.  Maintain on heparin drip for now until study completed. Maintain on BiPAP and titrate as tolerated.  Continue diuresis with Lasix with close monitoring of daily weights and I's and O's.  Consider Cardiology consultation to assist with management if PE study negative.  (Patient sees cardiologist Dr. Antoine Poche and last note on 03/2017 states that patient has had a false negative Lexiscan and that if symptoms worsen, he should be considered for catheterization.) 2. Type 2 diabetes with hyperglycemia.  Hold home metformin and insulin.  Maintain on sliding scale insulin every 4 hours with resistant scale while n.p.o. 3. Hypokalemia/hypomagnesemia.  Replacement ordered in ED. Recheck AM labs and monitor on telemetry. 4. Hypertension.  Continue amlodipine, losartan, and metoprolol. 5. CAD with prior stent.  Continue aspirin and statin. 6. GERD.  PPI.   DVT prophylaxis: Heparin drip Code Status: Full Family Communication: Wife and grandson at bedside Disposition Plan: Plan for diuresis with further cardiac evaluation and rule out PE/ACS Consults called: None Admission status: Inpatient, stepdown unit   Dayvian Blixt Hoover Brunette DO [facility] Pager [phone]  If 7PM-7AM, please contact night-coverage [email or web address] Password [facility]  10/07/2017, 5:32 AM

## 2017-10-07 NOTE — ED Notes (Signed)
Pt refused foley catheter, EDP notified

## 2017-10-07 NOTE — Consult Note (Signed)
Cardiology Consultation:   Patient ID: [name] [name]; [id number]; Jul 19, 1945   Admit date: 10/07/2017 Date of Consult: 10/07/2017  Primary Care Provider: [name], MD Primary Cardiologist: Dr. Rollene Rotunda   Patient Profile:   [name] [name] is a 72 y.o. 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 Kerry Hough.  History of Present Illness:   Mr. [name] states that over the last 1-2 weeks he has been experiencing increasing episodes of dyspnea on exertion and also describes orthopnea and PND.  It was actually an episode of more intense PND that brought him to the hospital.  He has felt intermittent angina symptoms as well, chest tightness as well as arm discomfort.  He states that he has been compliant with his medications at baseline.  No productive cough, fevers or chills.  States that he had he did have a preceding episode of the "flu."  Patient last saw Dr. Antoine Poche in October 2018.  He is described as being a nonresponder to Plavix and has undergone previous false negative Myoview assessment.  Presenting chest x-ray showed pulmonary edema with subsequent chest CT imaging also defining small pleural effusions and partial consolidation of both lower lobes raising the possibility of infiltrate.  He has not had a productive cough or fever.  Does have leukocytosis although this may be reactive.  Past Medical History:  Diagnosis Date  . Coronary artery disease    a. Cath: 2000 nonobs dz. b. 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 s/p BMS to LAD & RCA  ; s/p PCI/DES dRCA & PDA x2 2011. d. cath 02/17/14: LM patent, LAD calcified, diffuse irregs, no high grade stenosis mLAD stent patent, LCx, diffuse irregs no high-grade dz, RCA total occ w/ L to R collats, medical Rx rec  . Diabetes mellitus   . Diastolic dysfunction    a. echo 02/18/14: EF 45-50%, mild LVH, AK of inferior and inferoseptal myocardium, GR1DD  . Hiatal hernia   . Hyperlipidemia   . PVD (peripheral vascular disease) (HCC)    ABIs of 0.69 on right and 0.93 on left  . Renal artery stenosis (HCC)    Mild, bilateral  . Sleep apnea    Mild    Past Surgical History:  Procedure Laterality Date  . CATARACT EXTRACTION    . HIATAL HERNIA REPAIR    . LEFT HEART CATHETERIZATION WITH CORONARY ANGIOGRAM N/A 02/17/2014   Procedure: LEFT HEART CATHETERIZATION WITH CORONARY ANGIOGRAM;  Surgeon: Micheline Chapman, MD;  Location: [facility] CATH LAB;  Service: Cardiovascular;  Laterality: N/A;  . SHOULDER ARTHROSCOPY     Right  . 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 SOFT TISSUE TUMOR RESECTION     Benign neck tumor  . Vocal cord polyp removal       Inpatient Medications: Scheduled Meds: . allopurinol  100 mg Oral Daily  . amLODipine  10 mg Oral Daily  . aspirin  81 mg Oral Daily  . atorvastatin  40 mg Oral Daily  . furosemide  40 mg Intravenous Q12H  .  insulin aspart  0-20 Units Subcutaneous Q4H  . isosorbide mononitrate  120 mg Oral Daily  . losartan  100 mg Oral Daily  . metFORMIN  1,000 mg Oral BID  . metoprolol succinate  100 mg Oral BID  . pantoprazole  40 mg Oral Daily  . sodium chloride flush  3 mL Intravenous Q12H   Continuous Infusions: . sodium chloride    . levofloxacin (LEVAQUIN) IV     PRN Meds: sodium chloride, acetaminophen **OR** acetaminophen, ondansetron **OR** ondansetron (ZOFRAN) IV, sodium chloride flush  Allergies:    Allergies  Allergen Reactions  . Bee Venom Swelling  . Penicillins Itching    Social History:   Social History    Socioeconomic History  . Marital status: Married    Spouse name: Not on file  . Number of children: 3  . Years of education: Not on file  . Highest education level: Not on file  Occupational History  . 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 Financial resource strain: Not on file  . Food insecurity:    Worry: Not on file    Inability: Not on file  . Transportation needs:    Medical: Not on file    Non-medical: Not on file  Tobacco Use  . Smoking status: Former Smoker    Last attempt to quit: 06/19/2003    Years since quitting: 14.3  . Smokeless tobacco: Never Used  Substance and Sexual Activity  . Alcohol use: No  . Drug use: No  . Sexual activity: Not on file  Lifestyle  . Physical activity:    Days per week: Not on file    Minutes per session: Not on file  . Stress: Not on file  Relationships  . Social connections:    Talks on phone: Not on file    Gets together: Not on file    Attends religious service: Not on file    Active member of club or organization: Not on file    Attends meetings of clubs or organizations: Not on file    Relationship status: Not on file  . Intimate partner violence:    Fear of current or ex partner: Not on file    Emotionally abused: Not on file    Physically abused: Not on file    Forced sexual activity: Not on file  Other Topics Concern  . Not on file  Social History Narrative   Lives in [place] with his wife.    Family History:   The patient's family history includes Cancer in his brother and mother; Heart attack (age of onset: 69) in his father.  ROS:  Please see the history of present illness.  Recent orthopnea and PND, dyspnea on exertion, no leg edema.  All other ROS reviewed and negative.     Physical Exam/Data:   Vitals:   10/07/17 0635 10/07/17 0700 10/07/17 0800 10/07/17 0852  BP:      Pulse: (!) 110 (!) 112 (!) 123 (!) 109  Resp: 15 16 19 12   Temp:      TempSrc:       SpO2: 98% 96% 98% 93%  Weight:      Height:        Intake/Output Summary (Last 24 hours) at 10/07/2017 0857 Last data filed at 10/07/2017 0517 Gross per 24 hour  Intake 50 ml  Output 1500 ml  Net -1450 ml   Filed Weights   10/07/17  1610  Weight: 193 lb (87.5 kg)   Body mass index is 27.69 kg/m.   Gen: Patient is in no distress this morning. HEENT: Conjunctiva and lids normal, oropharynx clear. Neck: Supple, no elevated JVP or carotid bruits, no thyromegaly. Lungs: Decreased breath sounds at the bases, few rhonchi but no wheezing, nonlabored breathing at rest. Cardiac: Rapid regular rate and rhythm, no S3, 2/6 systolic murmur, no pericardial rub. Abdomen: Soft, nontender, bowel sounds present, no guarding or rebound. Extremities: No pitting edema, distal pulses 2+. Skin: Warm and dry. Musculoskeletal: No kyphosis. Neuropsychiatric: Alert and oriented x3, affect grossly appropriate.  EKG:  I personally reviewed the tracing from 10/07/2017 which showed sinus tachycardia with left atrial enlargement, ST elevation in lead aVR and otherwise fairly diffuse ST segment depression.  Telemetry:  I personally reviewed telemetry which shows sinus rhythm and sinus tachycardia.  Relevant CV Studies:  Cardiac catheterization 02/17/2014: Procedural Findings: Hemodynamics: AO 112/63 LV 112/15  Coronary angiography: Coronary dominance: right  Left mainstem: The left main is patent. There are minor irregularities noted. The left main supplies the LAD and a diagonal branch.  Left anterior descending (LAD): The LAD has moderate calcification. There are diffuse irregularities but no high-grade stenoses are present. The stented segment in the mid LAD is widely patent. The diagonal branches are patent with no high-grade disease. The first diagonal does have a long 50-60% stenosis present.  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 The vessel has diffuse  irregularity with no high-grade disease. The OM branches are patent with diffuse nonobstructive disease.  Right coronary artery (RCA): The RCA is totally occluded in its midportion within the previously stented segment. The PDA, PLA, and distal RCA filled from left to right collaterals  Left ventriculography: The inferior wall is akinetic. The anterolateral wall is hypokinetic. The LVEF is estimated at 40-45%.  Contrast: 90 cc Omnipaque  Estimated Blood Loss: Minimal  Final Conclusions:   1. Total occlusion of the mid RCA with left to right collaterals 2. Continue patency of the mid LAD stent with nonobstructive LAD/diagonal stenosis 3. 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 Moderate segmental LV systolic dysfunction  Echocardiogram 02/18/2014: Study Conclusions  - Left ventricle: The cavity size was normal. Wall thickness was increased in a pattern of mild LVH. Systolic function was mildly reduced. The estimated ejection fraction was in the range of 45% to 50%. There is akinesis of the inferior and inferoseptal myocardium. Doppler parameters are consistent with abnormal left ventricular relaxation (grade 1 diastolic dysfunction).  Laboratory Data:  Chemistry Recent Labs  Lab 10/07/17 0326 10/07/17 0352  NA 141 140  K 3.2* 3.0*  CL 104 101  CO2 22  --   GLUCOSE 410* 396*  BUN 17 16  CREATININE 0.54* 0.50*  CALCIUM 9.0  --   GFRNONAA >60  --   GFRAA >60  --   ANIONGAP 15  --     No results for input(s): PROT, ALBUMIN, AST, ALT, ALKPHOS, BILITOT in the last 168 hours. Hematology Recent Labs  Lab 10/07/17 0326 10/07/17 0352  WBC 16.3*  --   RBC 5.82*  --   HGB 16.6 15.6  HCT 49.1 46.0  MCV 84.4  --   MCH 28.5  --   MCHC 33.8  --   RDW 13.6  --   PLT 217  --    Cardiac Enzymes Recent Labs  Lab 10/07/17 0326 10/07/17 9604  TROPONINI <0.03 0.20*    Recent Labs  Lab  10/07/17 0349  TROPIPOC 0.01    BNP Recent Labs  Lab 10/07/17 0326  BNP 136.0*    DDimer  Recent Labs  Lab 10/07/17 0326  DDIMER 0.50    Radiology/Studies:  Ct Angio Chest Pe W And/or Wo Contrast  Result Date: 10/07/2017 CLINICAL DATA:  Acute onset of shortness of breath with exertion. Elevated D-dimer. EXAM: CT ANGIOGRAPHY CHEST WITH CONTRAST TECHNIQUE: Multidetector CT imaging of the chest was performed using the standard protocol during bolus administration of intravenous contrast. Multiplanar CT image reconstructions and MIPs were obtained to evaluate the vascular anatomy. CONTRAST:  ISOVUE-370 IOPAMIDOL (ISOVUE-370) INJECTION 76% COMPARISON:  Chest radiograph performed earlier today at 3:55 a.m. FINDINGS: Cardiovascular:  There is no evidence of pulmonary embolus. The heart is normal in size. Diffuse coronary artery calcifications are seen. Scattered calcification is noted along the thoracic aorta and proximal great vessels. Mediastinum/Nodes: Visualized mediastinal nodes are borderline normal in size. No pericardial effusion is identified. The right thyroid lobe is absent. The left thyroid lobe is unremarkable in appearance. No axillary lymphadenopathy is seen. Lungs/Pleura: Small bilateral pleural effusions are noted. Partial consolidation of both lower lobes is concerning for pneumonia. Mild additional airspace opacities are noted bilaterally. Underlying interstitial prominence raises concern for mild interstitial edema. Mild bilateral emphysema is noted. There is no evidence of pneumothorax.  No dominant mass is seen. Upper Abdomen: The visualized portions of the liver and spleen are unremarkable. Musculoskeletal: No acute osseous abnormalities are identified. The visualized musculature is unremarkable in appearance. Review of the MIP images confirms the above findings. IMPRESSION: 1. No evidence of pulmonary embolus. 2. Small bilateral pleural effusions. Partial consolidation of  both lower lung lobes is concerning for pneumonia. Mild additional airspace opacities noted bilaterally. 3. Underlying interstitial prominence raises concern for mild interstitial edema. 4. Mild bilateral emphysema. 5. Diffuse coronary artery calcifications. Electronically Signed   By: Roanna Raider M.D.   On: 10/07/2017 06:51   Dg Chest Portable 1 View  Result Date: 10/07/2017 CLINICAL DATA:  Acute onset of shortness of breath. EXAM: PORTABLE CHEST 1 VIEW COMPARISON:  Chest radiograph performed 02/16/2014 FINDINGS: The lungs are well-aerated. Vascular congestion is noted. Diffuse increased interstitial markings raise concern for pulmonary edema. There is no evidence of pleural effusion or pneumothorax. The cardiomediastinal silhouette is within normal limits. No acute osseous abnormalities are seen. IMPRESSION: Vascular congestion. Diffusely increased interstitial markings raise concern for pulmonary edema. Electronically Signed   By: Roanna Raider M.D.   On: 10/07/2017 04:17    Assessment and Plan:   1.  Acute hypoxic respiratory failure in the setting of apparent pulmonary edema with a 1 to 2-week history of progressive dyspnea on exertion, orthopnea, and PND concerning for heart failure presentation.  Also concern for ACS in light of his history of CAD with recurring angina during this time course.  Troponin I is only 0.2 at this time.  ECG does suggest possible diffuse ischemic changes.  2. 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  Patient reported to be a nonresponder to Plavix.  Echocardiogram in 2015 revealed LVEF 45 to 50% with inferior/inferoseptal akinesis.  3.  Sinus tachycardia.  At first was  concerned that this was related to his acute presentation, however he states that his heart rate is "always fast" and chart review does find heart rates to range from 105-120 at outpatient visits.  4.  Type 2 diabetes mellitus, uncontrolled.  5.  Mixed hyperlipidemia, currently on Lipitor.  6.  PAD with history of mild renal artery stenosis.  7.  Mild obstructive sleep apnea.  Reviewed records, discussed situation with patient and family members as well as Dr. Kerry Hough.  We will get an echocardiogram obtained to reassess LVEF.  Initiate heparin with continued cycling of cardiac markers.  Agree with IV Lasix for diuresis, he has shown some clinical improvement.  Recommend transfer to [facility] for further evaluation.  He is also on IV Levaquin given questionable infiltrates by chest CT, but afebrile and not with productive cough.  Ultimately anticipate cardiac catheterization for further evaluation.   Signed, Nona Dell, MD  10/07/2017 8:57 AM

## 2017-10-07 NOTE — ED Notes (Addendum)
Pt placed on 4L oxygen via [place] to assess O2 saturation- O2 sats are 91% on [place]

## 2017-10-07 NOTE — Progress Notes (Signed)
Inpatient Diabetes Program Recommendations  AACE/ADA: New Consensus Statement on Inpatient Glycemic Control (2015)  Target Ranges:  Prepandial:   less than 140 mg/dL      Peak postprandial:   less than 180 mg/dL (1-2 hours)      Critically ill patients:  140 - 180 mg/dL   Lab Results  Component Value Date   GLUCAP 282 (H) 10/07/2017   HGBA1C 9.9 (H) 05/30/2010    Review of Glycemic Control Results for TAVIEN, RICARDEZ (MRN [id number]) as of 10/07/2017 13:04  Ref. Range 10/07/2017 06:46 10/07/2017 07:35 10/07/2017 09:23 10/07/2017 11:35  Glucose-Capillary Latest Ref Range: 65 - 99 mg/dL 003 (H) 704 (H) 888 (H) 282 (H)   Diabetes history: DM Outpatient Diabetes medications: 70/30 48 units bid + Metformin 1 gm bid Current orders for Inpatient glycemic control: 70/30 50 units bid + Metformin 1 gm bid + Novolog resistant correction scale q 4 hrs.  Inpatient Diabetes Program Recommendations:   -A1c to determine prehospital glycemic control -Decrease Novolog correction scale to tid + hs 0-5 units due to patient is eating  Thank you, [name] [name]. [name], RN, MSN, CDE  Diabetes Coordinator Inpatient Glycemic Control Team Team Pager [phone] (8am-5pm) 10/07/2017 1:11 PM

## 2017-10-07 NOTE — Progress Notes (Signed)
Patient admitted to the hospital earlier this morning by Dr. Sherryll Burger.  Patient seen and examined. He is feeling better since admission, shortness of breath is improving. He has not had any cough or fever. No chest pain at this time. He has 1+ edema bilaterally. He has crackles at bases. Heart rate is tachycardic. He is sitting up in bed, resting comfortably at this time.  Patient admitted to the hospital with acute hypoxic respiratory failure, felt to be related to decompensated CHF. He was initially started on bipap for respiratory distress. Since being started on lasix, his respiratory status has improved. He is now on 4L. He will need continued diuresis. CT chest negative for PE, but does mention some area of consolidation. With no fever, cough, it is unlikely that he has a true pneumonia, will hold off on further antibiotics. His overall improvement in breathing since yesterday is likely related to diuresis. He is also noted to have a mild bump in troponin. With possible diffuse ischemic changes on EKG, he has been started on heparin. Discussed case with Dr. Diona Browner who has recommended transfer to [facility], since patient may likely need cardiac catheterization. Echo has been ordered. Blood sugars are running high, will restart his 70/30 insulin.   [employer]

## 2017-10-07 NOTE — ED Notes (Signed)
POC CBG: 475

## 2017-10-07 NOTE — ED Triage Notes (Signed)
EMS called in for difficulty breathing. Pt denies any pain, reports no history of respiratory problems. EMS reports pt was anxious upon arrival and O2 sats were around 90% on room air. Pt was placed on non-re breather and O2 sats came up to 98%. Upon  Arrival to ED pt O2 sats were 79% on room air. Dr Manus Gunning at bedside, RT at bedside. Pt placed on non-rebreather and O2 sats increased to 96%, respirations decreased to 21.

## 2017-10-07 NOTE — ED Notes (Signed)
Date and time results received: 10/07/17 0650 Test: Troponin, CBG Critical Value: Troponin: 0.20 CBG: 475  Name of Provider Notified: Sherryll Burger  Orders Received? Or Actions Taken?: verbal order to admin Insulin 25units

## 2017-10-07 NOTE — ED Provider Notes (Addendum)
[facility] EMERGENCY DEPARTMENT Provider Note   CSN: [id number] Arrival date & time: 10/07/17  0320     History   Chief Complaint Chief Complaint  Patient presents with  . Respiratory Distress    HPI [name] [name] is a 72 y.o. male.  Level 5 caveat for respiratory distress.  Patient brought in by EMS with shortness of breath that woke him from sleep.  States he felt well when he went to bed.  EMS reports anxious upon arrival with O2 sats in the 90s.  Lungs clear to auscultation.  On arrival to the ED patient's sats are in the 70s.  He is wheezing.  He denies any history of COPD or asthma but is a former smoker.  No history of VT E or CHF.  He denies chest pain.  Denies any recent cough or fever.  No leg pain or leg swelling. Patient with a history of CAD status post stent, diabetes, hyperlipidemia and sleep apnea.  The history is provided by the patient and the EMS personnel. The history is limited by the condition of the patient.    Past Medical History:  Diagnosis Date  . Coronary artery disease    a. Cath: 2000 nonobs dz. b. 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 s/p BMS to LAD & RCA ; s/p PCI/DES dRCA & PDA x2 2011. d. cath 02/17/14: LM patent, LAD calcified, diffuse irregs, no high grade stenosis mLAD stent patent, LCx, diffuse irregs no high-grade dz, RCA total occ w/ L to R collats, medical Rx rec  . Diabetes mellitus   . Diastolic dysfunction    a. echo 02/18/14: EF 45-50%, mild LVH, AK of inferior and inferoseptal myocardium, GR1DD  . Hiatal hernia   . Hyperlipidemia   . PVD (peripheral vascular disease) (HCC)    ABIs of 0.69 on right and 0.93 on left  . Renal artery stenosis (HCC)    Mild, bilateral  . Sleep apnea    Mild    Patient Active Problem List   Diagnosis Date Noted  . Essential hypertension 04/02/2017  . Dyslipidemia 04/02/2017  . Bruit 04/02/2017  . Intermediate coronary syndrome (HCC) 02/16/2014  . NSTEMI (non-ST  elevated myocardial infarction) (HCC) 02/16/2014  . PVD 04/26/2010  . DM 04/25/2010  . HYPERLIPIDEMIA 04/25/2010  . Coronary atherosclerosis 04/25/2010  . RENAL ARTERY STENOSIS 04/25/2010  . SLEEP APNEA 04/25/2010    Past Surgical History:  Procedure Laterality Date  . CATARACT EXTRACTION    . HIATAL HERNIA REPAIR    . LEFT HEART CATHETERIZATION WITH CORONARY ANGIOGRAM N/A 02/17/2014   Procedure: LEFT HEART CATHETERIZATION WITH CORONARY ANGIOGRAM;  Surgeon: Micheline Chapman, MD;  Location: [facility] CATH LAB;  Service: Cardiovascular;  Laterality: N/A;  . SHOULDER ARTHROSCOPY     Right  . 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 SOFT TISSUE TUMOR RESECTION     Benign neck tumor  . Vocal cord polyp removal          Home Medications    Prior to Admission medications   Medication Sig Start Date End Date Taking? Authorizing Provider  allopurinol (ZYLOPRIM) 100 MG tablet Take 100 mg by mouth daily.      [provider]  amLODipine (NORVASC) 10 MG tablet Take 1 tablet (10 mg total) by mouth daily. 09/24/12   Rollene Rotunda, MD  aspirin 81 MG tablet Take 81 mg by mouth daily.  [provider]  atorvastatin (LIPITOR) 40 MG tablet Take 40 mg by mouth daily.    [provider]  fenofibrate (TRICOR) 145 MG tablet TAKE 1 TABLET BY MOUTH EVERY DAY 05/03/17   Rollene Rotunda, MD  Fish Oil OIL Take 1 capsule by mouth daily.      [provider]  hydrochlorothiazide (MICROZIDE) 12.5 MG capsule Take 12.5 mg by mouth daily.    [provider]  insulin NPH-regular Human (NOVOLIN 70/30) (70-30) 100 UNIT/ML injection Inject 48 Units into the skin 2 (two) times daily.    [provider]  isosorbide mononitrate (IMDUR) 120 MG 24 hr tablet Take 1 tablet (120 mg total) by mouth daily. 04/01/17   Rollene Rotunda, MD  losartan (COZAAR) 100 MG tablet Take 100 mg by mouth daily.    [provider]  metFORMIN (GLUCOPHAGE) 500  MG tablet Take 1,000 mg by mouth 2 (two) times daily. 02/19/14   Dunn, Raymon Mutton, PA-C  metoprolol (TOPROL-XL) 200 MG 24 hr tablet Take 0.5 tablets (100 mg total) by mouth 2 (two) times daily. 04/01/17   Rollene Rotunda, MD  metoprolol tartrate (LOPRESSOR) 100 MG tablet TAKE 1 TABLET BY MOUTH TWICE A DAY 08/26/17   Rollene Rotunda, MD  nitroGLYCERIN (NITROSTAT) 0.4 MG SL tablet Place 1 tablet (0.4 mg total) under the tongue every 5 (five) minutes as needed for chest pain. 10/14/13   Rollene Rotunda, MD  omeprazole (PRILOSEC) 20 MG capsule Take 20 mg by mouth daily.    [provider]    Family History Family History  Problem Relation Age of Onset  . Heart attack Father 2  . Cancer Mother   . Cancer Brother     Social History Social History   Tobacco Use  . Smoking status: Former Smoker    Last attempt to quit: 06/19/2003    Years since quitting: 14.3  . Smokeless tobacco: Never Used  Substance Use Topics  . Alcohol use: No  . Drug use: No     Allergies   Bee venom and Penicillins   Review of Systems Review of Systems  Unable to perform ROS: Severe respiratory distress     Physical Exam Updated Vital Signs BP 126/73   Pulse (!) 110   Temp 97.7 F (36.5 C) (Oral)   Resp 15   Ht 5\' 10"  (1.778 m)   Wt 87.5 kg (193 lb)   SpO2 98%   BMI 27.69 kg/m   Physical Exam  Constitutional: He is oriented to person, place, and time. He appears well-developed and well-nourished. He appears distressed.  Moderate respiratory distress, tachypnea, speaking in 1-2 word phrases  HENT:  Head: Normocephalic and atraumatic.  Mouth/Throat: Oropharynx is clear and moist. No oropharyngeal exudate.  Eyes: Pupils are equal, round, and reactive to light. Conjunctivae and EOM are normal.  Neck: Normal range of motion. Neck supple.  No meningismus.  Cardiovascular: Normal rate, regular rhythm, normal heart sounds and intact distal pulses.  No murmur heard. Pulmonary/Chest: He is in  respiratory distress. He has rales.  Diminished breath sounds with expiratory wheezing Crackles to mid lung  Abdominal: Soft. There is no tenderness. There is no rebound and no guarding.  Musculoskeletal: Normal range of motion. He exhibits no edema or tenderness.  No lower extremity edema  Neurological: He is alert and oriented to person, place, and time. No cranial nerve deficit. He exhibits normal muscle tone. Coordination normal.  No ataxia on finger to nose bilaterally. No pronator drift.  5/5 strength throughout. CN 2-12 intact.Equal grip strength. Sensation intact.   Skin: Skin is warm.  Psychiatric: He has a normal mood and affect. His behavior is normal.  Nursing note and vitals reviewed.    ED Treatments / Results  Labs (all labs ordered are listed, but only abnormal results are displayed) Labs Reviewed  CBC WITH DIFFERENTIAL/PLATELET - Abnormal; Notable for the following components:      Result Value   WBC 16.3 (*)    RBC 5.82 (*)    Neutro Abs 10.2 (*)    Lymphs Abs 5.3 (*)    All other components within normal limits  BASIC METABOLIC PANEL - Abnormal; Notable for the following components:   Potassium 3.2 (*)    Glucose, Bld 410 (*)    Creatinine, Ser 0.54 (*)    All other components within normal limits  BRAIN NATRIURETIC PEPTIDE - Abnormal; Notable for the following components:   B Natriuretic Peptide 136.0 (*)    All other components within normal limits  BLOOD GAS, ARTERIAL - Abnormal; Notable for the following components:   pH, Arterial 7.333 (*)    Acid-base deficit 3.0 (*)    All other components within normal limits  URINALYSIS, ROUTINE W REFLEX MICROSCOPIC - Abnormal; Notable for the following components:   Glucose, UA >=500 (*)    Hgb urine dipstick SMALL (*)    Protein, ur >=300 (*)    Bacteria, UA RARE (*)    All other components within normal limits  MAGNESIUM - Abnormal; Notable for the following components:   Magnesium 1.4 (*)    All other  components within normal limits  TROPONIN I - Abnormal; Notable for the following components:   Troponin I 0.20 (*)    All other components within normal limits  I-STAT CHEM 8, ED - Abnormal; Notable for the following components:   Potassium 3.0 (*)    Creatinine, Ser 0.50 (*)    Glucose, Bld 396 (*)    Calcium, Ion 1.11 (*)    All other components within normal limits  CBG MONITORING, ED - Abnormal; Notable for the following components:   Glucose-Capillary 475 (*)    All other components within normal limits  TROPONIN I  D-DIMER, QUANTITATIVE (NOT AT [facility])  TROPONIN I  TROPONIN I  I-STAT TROPONIN, ED    EKG EKG Interpretation  Date/Time:  Monday October 07 2017 03:42:28 EDT Ventricular Rate:  144 PR Interval:    QRS Duration: 99 QT Interval:  319 QTC Calculation: 494 R Axis:   90 Text Interpretation:  POSTERIOR EKG no ST elevation in v1 or v2  Repolarization abnormality, prob rate related Confirmed by Glynn Octave [phone]) on 10/07/2017 3:52:49 AM   Radiology Ct Angio Chest Pe W And/or Wo Contrast  Result Date: 10/07/2017 CLINICAL DATA:  Acute onset of shortness of breath with exertion. Elevated D-dimer. EXAM: CT ANGIOGRAPHY CHEST WITH CONTRAST TECHNIQUE: Multidetector CT imaging of the chest was performed using the standard protocol during bolus administration of intravenous contrast. Multiplanar CT image reconstructions and MIPs were obtained to evaluate the vascular anatomy. CONTRAST:  ISOVUE-370 IOPAMIDOL (ISOVUE-370) INJECTION 76% COMPARISON:  Chest radiograph performed earlier today at 3:55 a.m. FINDINGS: Cardiovascular:  There is no evidence of pulmonary embolus. The heart is normal in size. Diffuse coronary artery calcifications are seen. Scattered calcification is noted along the thoracic aorta and proximal great vessels. Mediastinum/Nodes: Visualized mediastinal nodes are borderline normal in size. No pericardial effusion is identified. The right thyroid lobe  is  absent. The left thyroid lobe is unremarkable in appearance. No axillary lymphadenopathy is seen. Lungs/Pleura: Small bilateral pleural effusions are noted. Partial consolidation of both lower lobes is concerning for pneumonia. Mild additional airspace opacities are noted bilaterally. Underlying interstitial prominence raises concern for mild interstitial edema. Mild bilateral emphysema is noted. There is no evidence of pneumothorax.  No dominant mass is seen. Upper Abdomen: The visualized portions of the liver and spleen are unremarkable. Musculoskeletal: No acute osseous abnormalities are identified. The visualized musculature is unremarkable in appearance. Review of the MIP images confirms the above findings. IMPRESSION: 1. No evidence of pulmonary embolus. 2. Small bilateral pleural effusions. Partial consolidation of both lower lung lobes is concerning for pneumonia. Mild additional airspace opacities noted bilaterally. 3. Underlying interstitial prominence raises concern for mild interstitial edema. 4. Mild bilateral emphysema. 5. Diffuse coronary artery calcifications. Electronically Signed   By: Roanna Raider M.D.   On: 10/07/2017 06:51   Dg Chest Portable 1 View  Result Date: 10/07/2017 CLINICAL DATA:  Acute onset of shortness of breath. EXAM: PORTABLE CHEST 1 VIEW COMPARISON:  Chest radiograph performed 02/16/2014 FINDINGS: The lungs are well-aerated. Vascular congestion is noted. Diffuse increased interstitial markings raise concern for pulmonary edema. There is no evidence of pleural effusion or pneumothorax. The cardiomediastinal silhouette is within normal limits. No acute osseous abnormalities are seen. IMPRESSION: Vascular congestion. Diffusely increased interstitial markings raise concern for pulmonary edema. Electronically Signed   By: Roanna Raider M.D.   On: 10/07/2017 04:17    Procedures Procedures (including critical care time)  Medications Ordered in ED Medications  magnesium  sulfate IVPB 2 g 50 mL (has no administration in time range)  methylPREDNISolone sodium succinate (SOLU-MEDROL) 125 mg/2 mL injection 125 mg (has no administration in time range)  albuterol (PROVENTIL,VENTOLIN) solution continuous neb (has no administration in time range)  ipratropium (ATROVENT) nebulizer solution 0.5 mg (has no administration in time range)     Initial Impression / Assessment and Plan / ED Course  I have reviewed the triage vital signs and the nursing notes.  Pertinent labs & imaging results that were available during my care of the patient were reviewed by me and considered in my medical decision making (see chart for details).    Respiratory distress and former smoker with history of CAD.  Hypoxic on room air with tachycardia did not receive any albuterol from EMS. Wheezing minimally.  We will treat for COPD with bronchodilators, steroids and magnesium.  Consider pulmonary embolism.  EKG on arrival shows depression in V2 and V3 which is new.  Posterior EKG however does not show any ST elevation.  Patient denies chest pain.  Discussed with Dr. [place] on arrival who agrees with not activating code STEMI at this time.  Patient started on heparin for ACS as well as possibility of pulmonary embolism.  Placed on BiPAP for work of breathing.  No CO2 retention on ABG.  Troponin is negative.  D-dimer 0.5.  Patient appears have flash pulmonary edema from unknown etiology.  He denies chest pain.  His troponin is negative but he does have EKG changes on initial EKG.  This was discussed with Dr. Daphine Deutscher of cardiology.  She reviewed EKG and feels this could be due to rates as well as patient's hypokalemia.  With negative troponins she feels patient can stay at [facility] at this time.  Patient is reassessed and comfortable on BiPAP.  He denies any chest pain.  He is diuresing well  after Lasix and nitroglycerin.  Given his tachycardia and hypoxia with sudden onset dyspnea will  rule out PE even though d-dimer is negative.  Admission discussed with Dr. Sherryll Burger who agrees.  CRITICAL CARE Performed by: Glynn Octave Total critical care time: 60 minutes Critical care time was exclusive of separately billable procedures and treating other patients. Critical care was necessary to treat or prevent imminent or life-threatening deterioration. Critical care was time spent personally by me on the following activities: development of treatment plan with patient and/or surrogate as well as nursing, discussions with consultants, evaluation of patient's response to treatment, examination of patient, obtaining history from patient or surrogate, ordering and performing treatments and interventions, ordering and review of laboratory studies, ordering and review of radiographic studies, pulse oximetry and re-evaluation of patient's condition.  Final Clinical Impressions(s) / ED Diagnoses   Final diagnoses:  Acute respiratory failure with hypoxia [facility])  Flash pulmonary edema [facility])    ED Discharge Orders    None       Glynn Octave, MD 10/07/17 0701    Glynn Octave, MD 10/10/17 [phone]

## 2017-10-08 DIAGNOSIS — I10 Essential (primary) hypertension: Secondary | ICD-10-CM

## 2017-10-08 LAB — BASIC METABOLIC PANEL
ANION GAP: 13 (ref 5–15)
BUN: 40 mg/dL — ABNORMAL HIGH (ref 6–20)
CHLORIDE: 105 mmol/L (ref 101–111)
CO2: 23 mmol/L (ref 22–32)
Calcium: 9 mg/dL (ref 8.9–10.3)
Creatinine, Ser: 0.98 mg/dL (ref 0.61–1.24)
GFR calc non Af Amer: >60 mL/min (ref >60)
Glucose, Bld: 152 mg/dL — ABNORMAL HIGH (ref 65–99)
POTASSIUM: 4.4 mmol/L (ref 3.5–5.1)
SODIUM: 141 mmol/L (ref 135–145)

## 2017-10-08 LAB — HEPARIN LEVEL (UNFRACTIONATED): HEPARIN UNFRACTIONATED: 0.12 [IU]/mL — AB (ref 0.30–0.70)

## 2017-10-08 LAB — GLUCOSE, CAPILLARY
GLUCOSE-CAPILLARY: 141 mg/dL — AB (ref 65–99)
GLUCOSE-CAPILLARY: 174 mg/dL — AB (ref 65–99)
GLUCOSE-CAPILLARY: 175 mg/dL — AB (ref 65–99)
Glucose-Capillary: 150 mg/dL — ABNORMAL HIGH (ref 65–99)
Glucose-Capillary: 173 mg/dL — ABNORMAL HIGH (ref 65–99)
Glucose-Capillary: 148 mg/dL — ABNORMAL HIGH (ref 65–99)

## 2017-10-08 LAB — CBC
HEMATOCRIT: 44.6 % (ref 39.0–52.0)
HEMOGLOBIN: 14.4 g/dL (ref 13.0–17.0)
MCH: 27.9 pg (ref 26.0–34.0)
MCHC: 32.3 g/dL (ref 30.0–36.0)
MCV: 86.4 fL (ref 78.0–100.0)
PLATELETS: 229 10*3/uL (ref 150–400)
RBC: 5.16 MIL/uL (ref 4.22–5.81)
RDW: 14 % (ref 11.5–15.5)
WBC: 18.6 10*3/uL — AB (ref 4.0–10.5)

## 2017-10-08 LAB — TROPONIN I: Troponin I: 0.57 ng/mL (ref <0.03)

## 2017-10-08 MED ORDER — IPRATROPIUM-ALBUTEROL 0.5-2.5 (3) MG/3ML IN SOLN
3.0000 mL | Freq: Four times a day (QID) | RESPIRATORY_TRACT | Status: DC | PRN
Start: 2017-10-08 — End: 2017-10-11

## 2017-10-08 MED ORDER — HEPARIN BOLUS VIA INFUSION
2500.0000 [IU] | Freq: Once | INTRAVENOUS | Status: AC
  Administered 2017-10-08: 2500 [IU] via INTRAVENOUS
  Filled 2017-10-08: qty 2500

## 2017-10-08 MED ORDER — GUAIFENESIN ER 600 MG PO TB12
1200.0000 mg | ORAL_TABLET | Freq: Two times a day (BID) | ORAL | Status: DC
  Administered 2017-10-08 – 2017-10-11 (×6): 1200 mg via ORAL
  Filled 2017-10-08 (×7): qty 2

## 2017-10-08 MED ORDER — IPRATROPIUM-ALBUTEROL 0.5-2.5 (3) MG/3ML IN SOLN
3.0000 mL | Freq: Two times a day (BID) | RESPIRATORY_TRACT | Status: DC
  Administered 2017-10-09: 3 mL via RESPIRATORY_TRACT
  Filled 2017-10-08: qty 3

## 2017-10-08 MED ORDER — IPRATROPIUM-ALBUTEROL 0.5-2.5 (3) MG/3ML IN SOLN
3.0000 mL | Freq: Four times a day (QID) | RESPIRATORY_TRACT | Status: DC
  Administered 2017-10-08 (×2): 3 mL via RESPIRATORY_TRACT
  Filled 2017-10-08 (×2): qty 3

## 2017-10-08 MED ORDER — LEVOFLOXACIN IN D5W 750 MG/150ML IV SOLN
750.0000 mg | INTRAVENOUS | Status: DC
  Administered 2017-10-08 – 2017-10-10 (×2): 750 mg via INTRAVENOUS
  Filled 2017-10-08 (×3): qty 150

## 2017-10-08 NOTE — Progress Notes (Signed)
PROGRESS NOTE    [name] [name]  ZOX:[id number] DOB: 1945/11/15 DOA: 10/07/2017 PCP: [name], MD    Brief Narrative:  72 year old male with a history of chronic systolic congestive heart failure coronary artery disease, presents to the hospital with respiratory failure thought to be related to decompensated CHF.  Initially required BiPAP therapy but has since been weaned down to nasal cannula.  He is being treated with intravenous Lasix for CHF exacerbation and is clinically improving with diuresis.  He has ruled in for ACS with positive troponin of 1.02 and is currently on intravenous heparin as well as other medical therapy.  There is also mention of possible consolidation on CT chest.  He has had mild fever as well as leukocytosis.  He is currently on intravenous antibiotics.  Cardiology is seeing the patient and recommends transfer to [facility] for cardiac catheterization.   Assessment & Plan:   Principal Problem:   Acute hypoxemic respiratory failure (HCC) Active Problems:   Type 2 diabetes mellitus without complication (HCC)   Coronary atherosclerosis   PVD   NSTEMI (non-ST elevated myocardial infarction) (HCC)   Essential hypertension   Dyslipidemia   Pulmonary edema   1. Acute respiratory failure with hypoxia.  Related to decompensated CHF.  Possibly has an element of pneumonia.  Clinically he is improving.  Initially required BiPAP, currently on nasal cannula.  Continue to wean down oxygen as tolerated. 2. Acute on chronic systolic congestive heart failure.  Ejection fraction of 45%.  Currently on intravenous Lasix.  Net volume status of -3.2 L.  Clinically is improving.  Continue on beta-blockers.  Continue current treatments. 3. NSTEMI.  Patient ruled in for ACS with troponin of 1.02.  Since trending down.  No complaints of chest pain.  Currently on intravenous heparin which requires close/frequent monitoring.  Continue, beta-blockers, aspirin, Imdur and  statin.  Plans are to transfer to [facility] for cardiac catheterization.   4. Possible pneumonia.  Consolidation noted on CT chest.  He has had a low-grade fever and has leukocytosis.  Continue on levofloxacin.  Continue bronchodilators. 5. Type 2 diabetes.  Metformin currently on hold.  Continue sliding scale insulin as well as 70/30 insulin.  Blood sugars currently stable. 6. Hyperlipidemia.  Continue statin 7. GERD.  Continue PPI   DVT prophylaxis: heparin infusion Code Status: full code Family Communication: discussed with family at the bedside Disposition Plan: transfer to [facility] for further evaluation, likely cardiac catheterization   Consultants:   cardiology  Procedures:  Echo: - Left ventricle: The cavity size was normal. Wall thickness was   increased in a pattern of mild LVH. The estimated ejection   fraction was 45%. There is hypokinesis of the basal-midinferior   and inferoseptal myocardium. Doppler parameters are consistent   with abnormal left ventricular relaxation (grade 1 diastolic   dysfunction). - Aortic valve: Poorly visualized. Moderately calcified annulus.   Mildly calcified leaflets. - Mitral valve: Mildly calcified annulus. There was trivial   regurgitation. - Right atrium: Central venous pressure (est): 3 mm Hg. - Atrial septum: No defect or patent foramen ovale was identified. - Tricuspid valve: There was trivial regurgitation. - Pulmonary arteries: Systolic pressure could not be accurately   estimated.  - Pericardium, extracardiac: There was no pericardial effusion.  Antimicrobials:   levaquin 4/22>    Subjective: Feeling better. Breathing improving. Did not require bipap overnight. No chest pain.  Objective: Vitals:   10/08/17 0900 10/08/17 1000 10/08/17 1100 10/08/17 1101  BP:  110/66 121/82 129/73   Pulse: 76 79  78  Resp: 14 12  13   Temp:    97.7 F (36.5 C)  TempSrc:    Oral  SpO2: 95% 97%  96%  Weight:      Height:         Intake/Output Summary (Last 24 hours) at 10/08/2017 1203 Last data filed at 10/08/2017 1100 Gross per 24 hour  Intake 581.8 ml  Output 2950 ml  Net -2368.2 ml   Filed Weights   10/07/17 0332 10/08/17 0500  Weight: 87.5 kg (193 lb) 88.5 kg (195 lb 1.7 oz)    Examination:  General exam: Appears calm and comfortable  Respiratory system: crackles at bases. Respiratory effort normal. Cardiovascular system: S1 & S2 heard, RRR. No JVD, murmurs, rubs, gallops or clicks. No pedal edema. Gastrointestinal system: Abdomen is nondistended, soft and nontender. No organomegaly or masses felt. Normal bowel sounds heard. Central nervous system: Alert and oriented. No focal neurological deficits. Extremities: Symmetric 5 x 5 power. Skin: No rashes, lesions or ulcers Psychiatry: Judgement and insight appear normal. Mood & affect appropriate.     Data Reviewed: I have personally reviewed following labs and imaging studies  CBC: Recent Labs  Lab 10/07/17 0326 10/07/17 0352 10/08/17 0450  WBC 16.3*  --  18.6*  NEUTROABS 10.2*  --   --   HGB 16.6 15.6 14.4  HCT 49.1 46.0 44.6  MCV 84.4  --  86.4  PLT 217  --  229   Basic Metabolic Panel: Recent Labs  Lab 10/07/17 0326 10/07/17 0352 10/08/17 0450  NA 141 140 141  K 3.2* 3.0* 4.4  CL 104 101 105  CO2 22  --  23  GLUCOSE 410* 396* 152*  BUN 17 16 40*  CREATININE 0.54* 0.50* 0.98  CALCIUM 9.0  --  9.0  MG 1.4*  --   --    GFR: Estimated Creatinine Clearance: 79.7 mL/min (by C-G formula based on SCr of 0.98 mg/dL). Liver Function Tests: No results for input(s): AST, ALT, ALKPHOS, BILITOT, PROT, ALBUMIN in the last 168 hours. No results for input(s): LIPASE, AMYLASE in the last 168 hours. No results for input(s): AMMONIA in the last 168 hours. Coagulation Profile: No results for input(s): INR, PROTIME in the last 168 hours. Cardiac Enzymes: Recent Labs  Lab 10/07/17 0326 10/07/17 0607 10/07/17 1142 10/07/17 1726  10/08/17 0919  TROPONINI <0.03 0.20* 1.01* 1.02* 0.57*   BNP (last 3 results) No results for input(s): PROBNP in the last 8760 hours. HbA1C: No results for input(s): HGBA1C in the last 72 hours. CBG: Recent Labs  Lab 10/07/17 1946 10/07/17 2343 10/08/17 0338 10/08/17 0732 10/08/17 1059  GLUCAP 153* 184* 150* 174* 175*   Lipid Profile: No results for input(s): CHOL, HDL, LDLCALC, TRIG, CHOLHDL, LDLDIRECT in the last 72 hours. Thyroid Function Tests: No results for input(s): TSH, T4TOTAL, FREET4, T3FREE, THYROIDAB in the last 72 hours. Anemia Panel: No results for input(s): VITAMINB12, FOLATE, FERRITIN, TIBC, IRON, RETICCTPCT in the last 72 hours. Sepsis Labs: No results for input(s): PROCALCITON, LATICACIDVEN in the last 168 hours.  Recent Results (from the past 240 hour(s))  MRSA PCR Screening     Status: None   Collection Time: 10/07/17  8:52 AM  Result Value Ref Range Status   MRSA by PCR NEGATIVE NEGATIVE Final    Comment:        The GeneXpert MRSA Assay (FDA approved for NASAL specimens only), is one component of a  comprehensive MRSA colonization surveillance program. It is not intended to diagnose MRSA infection nor to guide or monitor treatment for MRSA infections. Performed at [facility], [address], [place], [place] [address]          Radiology Studies: Ct Angio Chest Pe W And/or Wo Contrast  Result Date: 10/07/2017 CLINICAL DATA:  Acute onset of shortness of breath with exertion. Elevated D-dimer. EXAM: CT ANGIOGRAPHY CHEST WITH CONTRAST TECHNIQUE: Multidetector CT imaging of the chest was performed using the standard protocol during bolus administration of intravenous contrast. Multiplanar CT image reconstructions and MIPs were obtained to evaluate the vascular anatomy. CONTRAST:  ISOVUE-370 IOPAMIDOL (ISOVUE-370) INJECTION 76% COMPARISON:  Chest radiograph performed earlier today at 3:55 a.m. FINDINGS: Cardiovascular:  There is no evidence of  pulmonary embolus. The heart is normal in size. Diffuse coronary artery calcifications are seen. Scattered calcification is noted along the thoracic aorta and proximal great vessels. Mediastinum/Nodes: Visualized mediastinal nodes are borderline normal in size. No pericardial effusion is identified. The right thyroid lobe is absent. The left thyroid lobe is unremarkable in appearance. No axillary lymphadenopathy is seen. Lungs/Pleura: Small bilateral pleural effusions are noted. Partial consolidation of both lower lobes is concerning for pneumonia. Mild additional airspace opacities are noted bilaterally. Underlying interstitial prominence raises concern for mild interstitial edema. Mild bilateral emphysema is noted. There is no evidence of pneumothorax.  No dominant mass is seen. Upper Abdomen: The visualized portions of the liver and spleen are unremarkable. Musculoskeletal: No acute osseous abnormalities are identified. The visualized musculature is unremarkable in appearance. Review of the MIP images confirms the above findings. IMPRESSION: 1. No evidence of pulmonary embolus. 2. Small bilateral pleural effusions. Partial consolidation of both lower lung lobes is concerning for pneumonia. Mild additional airspace opacities noted bilaterally. 3. Underlying interstitial prominence raises concern for mild interstitial edema. 4. Mild bilateral emphysema. 5. Diffuse coronary artery calcifications. Electronically Signed   By: Roanna Raider M.D.   On: 10/07/2017 06:51   Dg Chest Portable 1 View  Result Date: 10/07/2017 CLINICAL DATA:  Acute onset of shortness of breath. EXAM: PORTABLE CHEST 1 VIEW COMPARISON:  Chest radiograph performed 02/16/2014 FINDINGS: The lungs are well-aerated. Vascular congestion is noted. Diffuse increased interstitial markings raise concern for pulmonary edema. There is no evidence of pleural effusion or pneumothorax. The cardiomediastinal silhouette is within normal limits. No acute  osseous abnormalities are seen. IMPRESSION: Vascular congestion. Diffusely increased interstitial markings raise concern for pulmonary edema. Electronically Signed   By: Roanna Raider M.D.   On: 10/07/2017 04:17        Scheduled Meds: . allopurinol  100 mg Oral Daily  . amLODipine  10 mg Oral Daily  . aspirin  81 mg Oral Daily  . atorvastatin  40 mg Oral Daily  . insulin aspart  0-20 Units Subcutaneous Q4H  . insulin aspart protamine- aspart  50 Units Subcutaneous BID WC  . isosorbide mononitrate  120 mg Oral Daily  . losartan  100 mg Oral Daily  . metFORMIN  1,000 mg Oral BID  . metoprolol succinate  100 mg Oral BID  . pantoprazole  40 mg Oral Daily  . sodium chloride flush  3 mL Intravenous Q12H   Continuous Infusions: . sodium chloride    . heparin 1,400 Units/hr (10/08/17 1043)  . levofloxacin (LEVAQUIN) IV       LOS: 1 day    Time spent:    Erick Blinks, MD [facility] Pager [phone]  If 7PM-7AM,  please contact night-coverage [email or web address] Password [facility] 10/08/2017, 12:03 PM

## 2017-10-08 NOTE — Progress Notes (Signed)
Progress Note  [name] Date of Encounter: 10/08/2017  Primary Cardiologist: Hochrein  Subjective   No chest pain. Breathing has improved.   Inpatient Medications    Scheduled Meds: . allopurinol  100 mg Oral Daily  . amLODipine  10 mg Oral Daily  . aspirin  81 mg Oral Daily  . atorvastatin  40 mg Oral Daily  . furosemide  40 mg Intravenous Q12H  . insulin aspart  0-20 Units Subcutaneous Q4H  . insulin aspart protamine- aspart  50 Units Subcutaneous BID WC  . isosorbide mononitrate  120 mg Oral Daily  . losartan  100 mg Oral Daily  . metFORMIN  1,000 mg Oral BID  . metoprolol succinate  100 mg Oral BID  . pantoprazole  40 mg Oral Daily  . sodium chloride flush  3 mL Intravenous Q12H   Continuous Infusions: . sodium chloride    . heparin 1,100 Units/hr (10/08/17 0640)  . nitroGLYCERIN     PRN Meds: sodium chloride, acetaminophen **OR** acetaminophen, ondansetron **OR** ondansetron (ZOFRAN) IV, sodium chloride flush   Vital Signs    Vitals:   10/08/17 0700 10/08/17 0732 10/08/17 0800 10/08/17 0900  BP: 113/61 133/82 (!) 142/85 110/66  Pulse: 75 78 94 76  Resp: 16 15 20 14   Temp:  (!) 97.5 F (36.4 C)    TempSrc:  Oral    SpO2: 95% 97% 94% 95%  Weight:      Height:        Intake/Output Summary (Last 24 hours) at 10/08/2017 0941 Last data filed at 10/08/2017 0900 Gross per 24 hour  Intake 1101.95 ml  Output 2250 ml  Net -1148.05 ml   Filed Weights   10/07/17 0332 10/08/17 0500  Weight: 193 lb (87.5 kg) 195 lb 1.7 oz (88.5 kg)    Telemetry    SR - Personally Reviewed  ECG    na   GEN: No acute distress.   Neck: No JVD Cardiac: RRR, no murmurs, rubs, or gallops.  Respiratory: mild crackles bilateral bases GI: Soft, nontender, non-distended  MS: No edema; No deformity. Neuro:  Nonfocal  Psych: Normal affect   Labs    Chemistry Recent Labs  Lab 10/07/17 0326 10/07/17 0352 10/08/17 0450  NA 141 140 141  K 3.2* 3.0*  4.4  CL 104 101 105  CO2 22  --  23  GLUCOSE 410* 396* 152*  BUN 17 16 40*  CREATININE 0.54* 0.50* 0.98  CALCIUM 9.0  --  9.0  GFRNONAA >60  --  >60  GFRAA >60  --  >60  ANIONGAP 15  --  13     Hematology Recent Labs  Lab 10/07/17 0326 10/07/17 0352 10/08/17 0450  WBC 16.3*  --  18.6*  RBC 5.82*  --  5.16  HGB 16.6 15.6 14.4  HCT 49.1 46.0 44.6  MCV 84.4  --  86.4  MCH 28.5  --  27.9  MCHC 33.8  --  32.3  RDW 13.6  --  14.0  PLT 217  --  229    Cardiac Enzymes Recent Labs  Lab 10/07/17 0326 10/07/17 0607 10/07/17 1142 10/07/17 1726  TROPONINI <0.03 0.20* 1.01* 1.02*    Recent Labs  Lab 10/07/17 0349  TROPIPOC 0.01     BNP Recent Labs  Lab 10/07/17 0326  BNP 136.0*     DDimer  Recent Labs  Lab 10/07/17 0326  DDIMER 0.50     Radiology    Ct Angio  Chest Pe W And/or Wo Contrast  Result Date: 10/07/2017 CLINICAL DATA:  Acute onset of shortness of breath with exertion. Elevated D-dimer. EXAM: CT ANGIOGRAPHY CHEST WITH CONTRAST TECHNIQUE: Multidetector CT imaging of the chest was performed using the standard protocol during bolus administration of intravenous contrast. Multiplanar CT image reconstructions and MIPs were obtained to evaluate the vascular anatomy. CONTRAST:  ISOVUE-370 IOPAMIDOL (ISOVUE-370) INJECTION 76% COMPARISON:  Chest radiograph performed earlier today at 3:55 a.m. FINDINGS: Cardiovascular:  There is no evidence of pulmonary embolus. The heart is normal in size. Diffuse coronary artery calcifications are seen. Scattered calcification is noted along the thoracic aorta and proximal great vessels. Mediastinum/Nodes: Visualized mediastinal nodes are borderline normal in size. No pericardial effusion is identified. The right thyroid lobe is absent. The left thyroid lobe is unremarkable in appearance. No axillary lymphadenopathy is seen. Lungs/Pleura: Small bilateral pleural effusions are noted. Partial consolidation of both lower lobes is  concerning for pneumonia. Mild additional airspace opacities are noted bilaterally. Underlying interstitial prominence raises concern for mild interstitial edema. Mild bilateral emphysema is noted. There is no evidence of pneumothorax.  No dominant mass is seen. Upper Abdomen: The visualized portions of the liver and spleen are unremarkable. Musculoskeletal: No acute osseous abnormalities are identified. The visualized musculature is unremarkable in appearance. Review of the MIP images confirms the above findings. IMPRESSION: 1. No evidence of pulmonary embolus. 2. Small bilateral pleural effusions. Partial consolidation of both lower lung lobes is concerning for pneumonia. Mild additional airspace opacities noted bilaterally. 3. Underlying interstitial prominence raises concern for mild interstitial edema. 4. Mild bilateral emphysema. 5. Diffuse coronary artery calcifications. Electronically Signed   By: Roanna Raider M.D.   On: 10/07/2017 06:51   Dg Chest Portable 1 View  Result Date: 10/07/2017 CLINICAL DATA:  Acute onset of shortness of breath. EXAM: PORTABLE CHEST 1 VIEW COMPARISON:  Chest radiograph performed 02/16/2014 FINDINGS: The lungs are well-aerated. Vascular congestion is noted. Diffuse increased interstitial markings raise concern for pulmonary edema. There is no evidence of pleural effusion or pneumothorax. The cardiomediastinal silhouette is within normal limits. No acute osseous abnormalities are seen. IMPRESSION: Vascular congestion. Diffusely increased interstitial markings raise concern for pulmonary edema. Electronically Signed   By: Roanna Raider M.D.   On: 10/07/2017 04:17    Cardiac Studies    Patient Profile      [name] [name] is a 72 y.o. 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 Kerry Hough.    Assessment & Plan    1. CAD/NSTEMI - history of PCI to RCA and LAD  - plavix nonresponder - troponi up to 1.02, has not peaked. EKG LVH and chronic lateral ST depressions - echo this admit LVEF 45%, There is hypokinesis of the basal-midinferior. 2015 echo LVEF 45-50%, inferior/inferoseptal hypokinesis.    and inferoseptal myocardium  - medical therapy with ASA, atorva 40, hep gtt, losartan 100, toprol 100mg  bid - plan for transfer and cath once respiratory status improved - d/c NG drip, continue oral imdur.   2. Acute combined systolic/diastolic HF -  pulmonary edema with a 1 to 2-week history of  progressive dyspnea on exertion, orthopnea, and PND concerning for heart failure presentation - initially on bipap - CXR with vascular congestion, BNP 136 - - echo this admit LVEF 45%, grade I diastolic dysfunction  - negative 1.7 L yesterday, negative 3.1 liters since admission. On lasix  IV bid, significant uptrend in Cr and BUN. Dose IV lasix just once today (already received just past midnight) - medical therapy with losartan 100, toprol  bid  Not ready for cath yet today. Wean O2 and follow sats, potentially tomorrow. Transfer is pending by primary team. We will make npo tonight.    For questions or updates, please contact [facility] Please consult [email or web address] for contact info under Cardiology/STEMI.      Joanie Coddington, MD  10/08/2017, 9:41 AM       -

## 2017-10-08 NOTE — Progress Notes (Signed)
ANTICOAGULATION CONSULT NOTE -  Pharmacy Consult for HEPARIN Indication: chest pain/ACS  Allergies  Allergen Reactions  . Bee Venom Swelling  . Penicillins Itching   Patient Measurements: Height: 5\' 10"  (177.8 cm) Weight: 195 lb 1.7 oz (88.5 kg) IBW/kg (Calculated) : 73 HEPARIN DW (KG): 87.5  Vital Signs: Temp: 97.8 F (36.6 C) (04/23 2000) Temp Source: Oral (04/23 2000) BP: 112/63 (04/23 1900) Pulse Rate: 80 (04/23 1900)  Labs: Recent Labs    10/07/17 0326 10/07/17 0352  10/07/17 1142 10/07/17 1726 10/08/17 0450 10/08/17 0919 10/08/17 1837  HGB 16.6 15.6  --   --   --  14.4  --   --   HCT 49.1 46.0  --   --   --  44.6  --   --   PLT 217  --   --   --   --  229  --   --   HEPARINUNFRC  --   --   --   --   --   --  <0.10* 0.12*  CREATININE 0.54* 0.50*  --   --   --  0.98  --   --   TROPONINI <0.03  --    < > 1.01* 1.02*  --  0.57*  --    < > = values in this interval not displayed.   Estimated Creatinine Clearance: 79.7 mL/min (by C-G formula based on SCr of 0.98 mg/dL).  Medical History: Past Medical History:  Diagnosis Date  . Coronary artery disease    a. Cath: 2000 nonobs dz. b. 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 s/p BMS to LAD & RCA ; s/p PCI/DES dRCA & PDA x2 2011. d. cath 02/17/14: LM patent, LAD calcified, diffuse irregs, no high grade stenosis mLAD stent patent, LCx, diffuse irregs no high-grade dz, RCA total occ w/ L to R collats, medical Rx rec  . Diabetes mellitus   . Diastolic dysfunction    a. echo 02/18/14: EF 45-50%, mild LVH, AK of inferior and inferoseptal myocardium, GR1DD  . Hiatal hernia   . Hyperlipidemia   . PVD (peripheral vascular disease) (HCC)    ABIs of 0.69 on right and 0.93 on left  . Renal artery stenosis (HCC)    Mild, bilateral  . Sleep apnea    Mild   Medications:  Medications Prior to Admission  Medication Sig Dispense Refill Last Dose  . allopurinol (ZYLOPRIM) 100 MG tablet Take 100 mg by  mouth daily.     10/06/2017 at Unknown time  . amLODipine (NORVASC) 10 MG tablet Take 1 tablet (10 mg total) by mouth daily. 90 tablet 3 10/06/2017 at Unknown time  . aspirin 81 MG tablet Take 81 mg by mouth daily.     10/06/2017 at Unknown time  . fenofibrate (TRICOR) 145 MG tablet TAKE 1 TABLET BY MOUTH EVERY DAY 30 tablet 11 10/06/2017 at Unknown time  . Fish Oil OIL Take 1 capsule by mouth daily.     Past Month at Unknown time  . hydrochlorothiazide (MICROZIDE) 12.5 MG capsule Take 12.5 mg by mouth daily.   10/06/2017 at Unknown time  . insulin NPH-regular Human (NOVOLIN 70/30) (70-30) 100 UNIT/ML injection Inject 48 Units into the skin 2 (two) times daily.   10/06/2017 at Unknown time  . isosorbide mononitrate (IMDUR) 120 MG 24 hr tablet Take 1 tablet (120 mg total) by mouth daily. 90 tablet 3 10/06/2017 at Unknown time  . losartan (COZAAR)  100 MG tablet Take 100 mg by mouth daily.   10/06/2017 at Unknown time  . metFORMIN (GLUCOPHAGE) 500 MG tablet Take 1,000 mg by mouth 2 (two) times daily.   10/06/2017 at Unknown time  . metoprolol (TOPROL-XL) 200 MG 24 hr tablet Take 0.5 tablets (100 mg total) by mouth 2 (two) times daily. 90 tablet 3 10/06/2017 at Unknown time  . metoprolol tartrate (LOPRESSOR) 100 MG tablet TAKE 1 TABLET BY MOUTH TWICE A DAY 60 tablet 5 10/06/2017 at 0300  . omeprazole (PRILOSEC) 20 MG capsule Take 20 mg by mouth daily.   10/06/2017 at Unknown time  . atorvastatin (LIPITOR) 40 MG tablet Take 40 mg by mouth daily.   More than a month at Unknown time  . nitroGLYCERIN (NITROSTAT) 0.4 MG SL tablet Place 1 tablet (0.4 mg total) under the tongue every 5 (five) minutes as needed for chest pain. 25 tablet 11 More than a month at Unknown time    Assessment: 72yo male CAD status post previous PCI to the RCA and LAD with most recent cardiac catheterization in 2015 showing occlusion of the mid RCA associated with left to right collaterals, patent mid LAD stent with nonobstructive LAD/diagonal  stenosis, and mild atherosclerosis.  Pharmacy asked to start Heparin.  Heparin level subtherapeutic 0.12  Goal of Therapy:  Heparin level 0.3-0.7 units/ml Monitor platelets by anticoagulation protocol: Yes   Plan:  Rebolus 2500 units x 1 Increase Heparin infusion to 1650 units/hr Heparin level in 8 hrs then daily CBC daily while on Heparin   Judeth Cornfield, PharmD Clinical Pharmacist 10/08/2017 8:41 PM

## 2017-10-08 NOTE — Progress Notes (Addendum)
ANTICOAGULATION CONSULT NOTE -  Pharmacy Consult for HEPARIN Indication: chest pain/ACS  Allergies  Allergen Reactions  . Bee Venom Swelling  . Penicillins Itching   Patient Measurements: Height: 5\' 10"  (177.8 cm) Weight: 195 lb 1.7 oz (88.5 kg) IBW/kg (Calculated) : 73 HEPARIN DW (KG): 87.5  Vital Signs: Temp: 97.5 F (36.4 C) (04/23 0732) Temp Source: Oral (04/23 0732) BP: 110/66 (04/23 0900) Pulse Rate: 76 (04/23 0900)  Labs: Recent Labs    10/07/17 0326 10/07/17 0352  10/07/17 1142 10/07/17 1726 10/08/17 0450 10/08/17 0919  HGB 16.6 15.6  --   --   --  14.4  --   HCT 49.1 46.0  --   --   --  44.6  --   PLT 217  --   --   --   --  229  --   HEPARINUNFRC  --   --   --   --   --   --  <0.10*  CREATININE 0.54* 0.50*  --   --   --  0.98  --   TROPONINI <0.03  --    < > 1.01* 1.02*  --  0.57*   < > = values in this interval not displayed.   Estimated Creatinine Clearance: 79.7 mL/min (by C-G formula based on SCr of 0.98 mg/dL).  Medical History: Past Medical History:  Diagnosis Date  . Coronary artery disease    a. Cath: 2000 nonobs dz. b. 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 s/p BMS to LAD & RCA ; s/p PCI/DES dRCA & PDA x2 2011. d. cath 02/17/14: LM patent, LAD calcified, diffuse irregs, no high grade stenosis mLAD stent patent, LCx, diffuse irregs no high-grade dz, RCA total occ w/ L to R collats, medical Rx rec  . Diabetes mellitus   . Diastolic dysfunction    a. echo 02/18/14: EF 45-50%, mild LVH, AK of inferior and inferoseptal myocardium, GR1DD  . Hiatal hernia   . Hyperlipidemia   . PVD (peripheral vascular disease) (HCC)    ABIs of 0.69 on right and 0.93 on left  . Renal artery stenosis (HCC)    Mild, bilateral  . Sleep apnea    Mild   Medications:  Medications Prior to Admission  Medication Sig Dispense Refill Last Dose  . allopurinol (ZYLOPRIM) 100 MG tablet Take 100 mg by mouth daily.     10/06/2017 at Unknown time  .  amLODipine (NORVASC) 10 MG tablet Take 1 tablet (10 mg total) by mouth daily. 90 tablet 3 10/06/2017 at Unknown time  . aspirin 81 MG tablet Take 81 mg by mouth daily.     10/06/2017 at Unknown time  . fenofibrate (TRICOR) 145 MG tablet TAKE 1 TABLET BY MOUTH EVERY DAY 30 tablet 11 10/06/2017 at Unknown time  . Fish Oil OIL Take 1 capsule by mouth daily.     Past Month at Unknown time  . hydrochlorothiazide (MICROZIDE) 12.5 MG capsule Take 12.5 mg by mouth daily.   10/06/2017 at Unknown time  . insulin NPH-regular Human (NOVOLIN 70/30) (70-30) 100 UNIT/ML injection Inject 48 Units into the skin 2 (two) times daily.   10/06/2017 at Unknown time  . isosorbide mononitrate (IMDUR) 120 MG 24 hr tablet Take 1 tablet (120 mg total) by mouth daily. 90 tablet 3 10/06/2017 at Unknown time  . losartan (COZAAR) 100 MG tablet Take 100 mg by mouth daily.   10/06/2017 at Unknown time  . metFORMIN (  GLUCOPHAGE) 500 MG tablet Take 1,000 mg by mouth 2 (two) times daily.   10/06/2017 at Unknown time  . metoprolol (TOPROL-XL) 200 MG 24 hr tablet Take 0.5 tablets (100 mg total) by mouth 2 (two) times daily. 90 tablet 3 10/06/2017 at Unknown time  . metoprolol tartrate (LOPRESSOR) 100 MG tablet TAKE 1 TABLET BY MOUTH TWICE A DAY 60 tablet 5 10/06/2017 at 0300  . omeprazole (PRILOSEC) 20 MG capsule Take 20 mg by mouth daily.   10/06/2017 at Unknown time  . atorvastatin (LIPITOR) 40 MG tablet Take 40 mg by mouth daily.   More than a month at Unknown time  . nitroGLYCERIN (NITROSTAT) 0.4 MG SL tablet Place 1 tablet (0.4 mg total) under the tongue every 5 (five) minutes as needed for chest pain. 25 tablet 11 More than a month at Unknown time    Assessment: 72yo male CAD status post previous PCI to the RCA and LAD with most recent cardiac catheterization in 2015 showing occlusion of the mid RCA associated with left to right collaterals, patent mid LAD stent with nonobstructive LAD/diagonal stenosis, and mild atherosclerosis.  Pharmacy  asked to start Heparin.  Heparin level subtherapeutic <0.10  Goal of Therapy:  Heparin level 0.3-0.7 units/ml Monitor platelets by anticoagulation protocol: Yes   Plan:  Rebolus 2500 units x 1 Increase Heparin infusion to 1400 units/hr Heparin level in 8 hrs then daily CBC daily while on Heparin   Judeth Cornfield, PharmD Clinical Pharmacist 10/08/2017 10:43 AM

## 2017-10-09 ENCOUNTER — Inpatient Hospital Stay (HOSPITAL_COMMUNITY)
Admission: EM | Disposition: A | Discharge status: Home / Self Care | Payer: Self-pay | Source: Home / Self Care | Attending: Internal Medicine

## 2017-10-09 DIAGNOSIS — J810 Acute pulmonary edema: Secondary | ICD-10-CM | POA: Diagnosis present

## 2017-10-09 DIAGNOSIS — I25119 Atherosclerotic heart disease of native coronary artery with unspecified angina pectoris: Secondary | ICD-10-CM

## 2017-10-09 DIAGNOSIS — I739 Peripheral vascular disease, unspecified: Secondary | ICD-10-CM

## 2017-10-09 DIAGNOSIS — E782 Mixed hyperlipidemia: Secondary | ICD-10-CM

## 2017-10-09 DIAGNOSIS — I501 Left ventricular failure: Secondary | ICD-10-CM | POA: Insufficient documentation

## 2017-10-09 HISTORY — PX: CORONARY ULTRASOUND/IVUS: CATH118244

## 2017-10-09 HISTORY — PX: LEFT HEART CATH AND CORONARY ANGIOGRAPHY: CATH118249

## 2017-10-09 LAB — CBC
HCT: 41.9 % (ref 39.0–52.0)
Hemoglobin: 13.8 g/dL (ref 13.0–17.0)
MCH: 28.5 pg (ref 26.0–34.0)
MCHC: 32.9 g/dL (ref 30.0–36.0)
MCV: 86.6 fL (ref 78.0–100.0)
PLATELETS: 210 10*3/uL (ref 150–400)
RBC: 4.84 MIL/uL (ref 4.22–5.81)
RDW: 14.1 % (ref 11.5–15.5)
WBC: 11.9 10*3/uL — AB (ref 4.0–10.5)

## 2017-10-09 LAB — BASIC METABOLIC PANEL
ANION GAP: 10 (ref 5–15)
BUN: 36 mg/dL — ABNORMAL HIGH (ref 6–20)
CO2: 23 mmol/L (ref 22–32)
Calcium: 8.9 mg/dL (ref 8.9–10.3)
Chloride: 106 mmol/L (ref 101–111)
Creatinine, Ser: 0.72 mg/dL (ref 0.61–1.24)
GFR calc Af Amer: >60 mL/min (ref >60)
Glucose, Bld: 147 mg/dL — ABNORMAL HIGH (ref 65–99)
POTASSIUM: 4.2 mmol/L (ref 3.5–5.1)
Sodium: 139 mmol/L (ref 135–145)

## 2017-10-09 LAB — POCT ACTIVATED CLOTTING TIME
Activated Clotting Time: 219 seconds
Activated Clotting Time: 257 seconds

## 2017-10-09 LAB — GLUCOSE, CAPILLARY
GLUCOSE-CAPILLARY: 140 mg/dL — AB (ref 65–99)
GLUCOSE-CAPILLARY: 70 mg/dL (ref 65–99)
Glucose-Capillary: 151 mg/dL — ABNORMAL HIGH (ref 65–99)
Glucose-Capillary: 73 mg/dL (ref 65–99)
Glucose-Capillary: 179 mg/dL — ABNORMAL HIGH (ref 65–99)

## 2017-10-09 LAB — HEPARIN LEVEL (UNFRACTIONATED): Heparin Unfractionated: 0.33 IU/mL (ref 0.30–0.70)

## 2017-10-09 SURGERY — LEFT HEART CATH AND CORONARY ANGIOGRAPHY
Anesthesia: LOCAL

## 2017-10-09 MED ORDER — DEXTROSE 50 % IV SOLN
INTRAVENOUS | Status: AC
  Administered 2017-10-09: 25 mL
  Filled 2017-10-09: qty 50

## 2017-10-09 MED ORDER — LABETALOL HCL 5 MG/ML IV SOLN: 10.0000 mg | INTRAVENOUS | Status: DC | PRN

## 2017-10-09 MED ORDER — CLOPIDOGREL BISULFATE 75 MG PO TABS
600.0000 mg | ORAL_TABLET | Freq: Once | ORAL | Status: AC
  Administered 2017-10-09: 600 mg via ORAL
  Filled 2017-10-09: qty 8

## 2017-10-09 MED ORDER — LIDOCAINE HCL (PF) 1 % IJ SOLN
INTRAMUSCULAR | Status: DC | PRN
  Administered 2017-10-09: 2 mL

## 2017-10-09 MED ORDER — HEPARIN SODIUM (PORCINE) 1000 UNIT/ML IJ SOLN
INTRAMUSCULAR | Status: AC
  Filled 2017-10-09: qty 1

## 2017-10-09 MED ORDER — HEPARIN (PORCINE) IN NACL 2-0.9 UNITS/ML
INTRAMUSCULAR | Status: AC | PRN
  Administered 2017-10-09 (×2): 500 mL

## 2017-10-09 MED ORDER — SODIUM CHLORIDE 0.9% FLUSH
3.0000 mL | Freq: Two times a day (BID) | INTRAVENOUS | Status: DC
  Administered 2017-10-09 – 2017-10-10 (×2): 3 mL via INTRAVENOUS

## 2017-10-09 MED ORDER — HYDRALAZINE HCL 20 MG/ML IJ SOLN: 5.0000 mg | INTRAMUSCULAR | Status: AC | PRN

## 2017-10-09 MED ORDER — SODIUM CHLORIDE 0.9 % IV SOLN: 250.0000 mL | INTRAVENOUS | Status: DC | PRN

## 2017-10-09 MED ORDER — VERAPAMIL HCL 2.5 MG/ML IV SOLN
INTRAVENOUS | Status: DC | PRN
  Administered 2017-10-09: 10 mL via INTRA_ARTERIAL

## 2017-10-09 MED ORDER — LIDOCAINE HCL (PF) 1 % IJ SOLN
INTRAMUSCULAR | Status: AC
  Filled 2017-10-09: qty 30

## 2017-10-09 MED ORDER — CLOPIDOGREL BISULFATE 75 MG PO TABS
75.0000 mg | ORAL_TABLET | Freq: Every day | ORAL | Status: DC
  Administered 2017-10-10: 75 mg via ORAL
  Filled 2017-10-09: qty 1

## 2017-10-09 MED ORDER — ANGIOPLASTY BOOK
Freq: Once | Status: AC
  Administered 2017-10-09: 22:00:00
  Filled 2017-10-09: qty 1

## 2017-10-09 MED ORDER — VERAPAMIL HCL 2.5 MG/ML IV SOLN
INTRAVENOUS | Status: AC
  Filled 2017-10-09: qty 2

## 2017-10-09 MED ORDER — NITROGLYCERIN 1 MG/10 ML FOR IR/CATH LAB
INTRA_ARTERIAL | Status: AC
  Filled 2017-10-09: qty 10

## 2017-10-09 MED ORDER — MIDAZOLAM HCL 2 MG/2ML IJ SOLN
INTRAMUSCULAR | Status: AC
  Filled 2017-10-09: qty 2

## 2017-10-09 MED ORDER — MIDAZOLAM HCL 2 MG/2ML IJ SOLN
INTRAMUSCULAR | Status: DC | PRN
  Administered 2017-10-09: 2 mg via INTRAVENOUS

## 2017-10-09 MED ORDER — DEXTROSE 50 % IV SOLN: 12.5000 g | Freq: Once | INTRAVENOUS | Status: AC

## 2017-10-09 MED ORDER — HEART ATTACK BOUNCING BOOK
Freq: Once | Status: AC
  Administered 2017-10-09: 22:00:00
  Filled 2017-10-09: qty 1

## 2017-10-09 MED ORDER — FENTANYL CITRATE (PF) 100 MCG/2ML IJ SOLN
INTRAMUSCULAR | Status: AC
  Filled 2017-10-09: qty 2

## 2017-10-09 MED ORDER — HEPARIN (PORCINE) IN NACL 1000-0.9 UT/500ML-% IV SOLN
INTRAVENOUS | Status: AC
  Filled 2017-10-09: qty 1000

## 2017-10-09 MED ORDER — HEPARIN SODIUM (PORCINE) 1000 UNIT/ML IJ SOLN
INTRAMUSCULAR | Status: DC | PRN
  Administered 2017-10-09: 3000 [IU] via INTRAVENOUS
  Administered 2017-10-09 (×2): 5000 [IU] via INTRAVENOUS

## 2017-10-09 MED ORDER — HEPARIN (PORCINE) IN NACL 100-0.45 UNIT/ML-% IJ SOLN
1800.0000 [IU]/h | INTRAMUSCULAR | Status: DC
  Administered 2017-10-10: 1650 [IU]/h via INTRAVENOUS
  Filled 2017-10-09: qty 250

## 2017-10-09 MED ORDER — FENTANYL CITRATE (PF) 100 MCG/2ML IJ SOLN
INTRAMUSCULAR | Status: DC | PRN
  Administered 2017-10-09: 25 ug via INTRAVENOUS

## 2017-10-09 MED ORDER — NITROGLYCERIN 1 MG/10 ML FOR IR/CATH LAB
INTRA_ARTERIAL | Status: DC | PRN
  Administered 2017-10-09: 200 ug via INTRACORONARY

## 2017-10-09 MED ORDER — SODIUM CHLORIDE 0.9% FLUSH: 3.0000 mL | INTRAVENOUS | Status: DC | PRN

## 2017-10-09 MED ORDER — SODIUM CHLORIDE 0.9 % IV SOLN: INTRAVENOUS | Status: DC

## 2017-10-09 MED ORDER — IOHEXOL 350 MG/ML SOLN
INTRAVENOUS | Status: DC | PRN
  Administered 2017-10-09: 140 mL via INTRACARDIAC

## 2017-10-09 MED ORDER — SODIUM CHLORIDE 0.9 % IV SOLN: INTRAVENOUS | Status: AC

## 2017-10-09 MED ORDER — CLOPIDOGREL BISULFATE 75 MG PO TABS: 300.0000 mg | ORAL_TABLET | Freq: Once | ORAL | Status: DC

## 2017-10-09 SURGICAL SUPPLY — 17 items
CATH INFINITI 5FR MULTPACK ANG (CATHETERS) ×1 IMPLANT
CATH LAUNCHER 5F EBU3.0 (CATHETERS) IMPLANT
CATH LAUNCHER 5F EBU3.5 (CATHETERS) ×1 IMPLANT
CATH LAUNCHER 5F JR4 (CATHETERS) ×1 IMPLANT
CATH OPTICROSS 40MHZ (CATHETERS) ×1 IMPLANT
CATHETER LAUNCHER 5F EBU3.0 (CATHETERS) ×2
DEVICE RAD COMP TR BAND LRG (VASCULAR PRODUCTS) ×1 IMPLANT
GLIDESHEATH SLEND A-KIT 6F 22G (SHEATH) ×1 IMPLANT
GUIDEWIRE INQWIRE 1.5J.035X260 (WIRE) IMPLANT
INQWIRE 1.5J .035X260CM (WIRE) ×2
KIT ESSENTIALS PG (KITS) ×1 IMPLANT
KIT HEART LEFT (KITS) ×2 IMPLANT
PACK CARDIAC CATHETERIZATION (CUSTOM PROCEDURE TRAY) ×2 IMPLANT
SLED PULL BACK IVUS (MISCELLANEOUS) ×1 IMPLANT
TRANSDUCER W/STOPCOCK (MISCELLANEOUS) ×2 IMPLANT
TUBING CIL FLEX 10 FLL-RA (TUBING) ×2 IMPLANT
WIRE ASAHI PROWATER 180CM (WIRE) ×1 IMPLANT

## 2017-10-09 NOTE — Progress Notes (Signed)
8 OZ orange juice given pre transport-CBG 73. Sending [place] sandwich.

## 2017-10-09 NOTE — Progress Notes (Signed)
Received pt from [facility] via [facility] alert and oriented X4, skin warm and dry with no complaints of CP.  Pt lace on bedside monitor and IVF infusing without any problem at site.

## 2017-10-09 NOTE — Progress Notes (Signed)
Patient changed to only prn Duoneb. No needed for scheduled treatments with no wheezes and no noted patient history of diagnosis of respiratory disease or smoking. Continuing Q6 PRN for SOB and Wheezes if any complications arise from fluid or post heart catherization.

## 2017-10-09 NOTE — Interval H&P Note (Signed)
History and Physical Interval Note:  10/09/2017 4:00 PM  [name] [name]  has presented today for surgery, with the diagnosis of NSTEMI. The various methods of treatment have been discussed with the patient and family. After consideration of risks, benefits and other options for treatment, the patient has consented to  Procedure(s): LEFT HEART CATH AND CORONARY ANGIOGRAPHY (N/A) with possible PERCUTANEOUS CORONARY INTERVENTION as a surgical intervention .  The patient's history has been reviewed, patient examined, no change in status, stable for surgery.  I have reviewed the patient's chart and labs.  Questions were answered to the patient's satisfaction.    Cath Lab Visit (complete for each Cath Lab visit)  Clinical Evaluation Leading to the Procedure:   ACS: Yes.    Non-ACS:    Anginal Classification: CCS IV - PRESENTED WITH Acute Respiratory Failure with Pulmonary Edema & + Troponin  Anti-ischemic medical therapy: Maximal Therapy (2 or more classes of medications)  Non-Invasive Test Results: No non-invasive testing performed  Prior CABG: No previous CABG  Bryan Lemma

## 2017-10-09 NOTE — Progress Notes (Signed)
Md aware of sugar of 70 and ordered half an amp of D50, now given. Pt is asymptomatic but has been NPO for a procedure at [facility]. [facility] is now here to get pt.   Cristie Hem, RN  10/09/17 12:05 PM

## 2017-10-09 NOTE — Progress Notes (Signed)
PROGRESS NOTE  [name] Heggen  ZNB:[id number] DOB: [birth year] DOA: 10/07/2017 PCP: [name], MD    Brief Narrative:  72 year old male with a history of chronic systolic congestive heart failure coronary artery disease, presents to the hospital with respiratory failure thought to be related to decompensated CHF.  Initially required BiPAP therapy but has since been weaned down to nasal cannula.  He is being treated with intravenous Lasix for CHF exacerbation and is clinically improving with diuresis.  He has ruled in for ACS with positive troponin of 1.02 and is currently on intravenous heparin as well as other medical therapy.  There is also mention of possible consolidation on CT chest.  He has had mild fever as well as leukocytosis.  He is currently on intravenous antibiotics.  Cardiology is seeing the patient and recommends transfer to [facility] for cardiac catheterization.  Assessment & Plan:   Principal Problem:   Acute hypoxemic respiratory failure (HCC) Active Problems:   Type 2 diabetes mellitus without complication (HCC)   Coronary atherosclerosis   PVD   NSTEMI (non-ST elevated myocardial infarction) (HCC)   Essential hypertension   Dyslipidemia   Pulmonary edema  1. Acute respiratory failure with hypoxia.  Related to decompensated CHF.  Possibly has an element of pneumonia.  Clinically he is improving.  Initially required BiPAP, currently on nasal cannula.  Continue to wean down oxygen as tolerated. 2. Acute on chronic systolic congestive heart failure.  Ejection fraction of 45%.  Currently on intravenous Lasix.  Net volume status of -3.3 L.  Clinically is improving.  Continue on beta-blockers.  Continue current treatments. 3. NSTEMI.  Patient ruled in for ACS with troponin peak of 1.02.  No complaints of chest pain.  Currently on intravenous heparin which requires close/frequent monitoring.  Continue, beta-blockers, aspirin, Imdur and statin.  Plans are to transfer  to [facility] for cardiac catheterization.  Pt is NPO.  4. Pneumonia.  Consolidation noted on CT chest.  He has had a low-grade fever and has leukocytosis.  Continue on levofloxacin.  Continue bronchodilators. 5. Type 2 diabetes.  Metformin currently on hold.  Continue sliding scale insulin as well as 70/30 insulin.  Blood sugars currently stable. 6. Hyperlipidemia.  Continue statin 7. GERD.  Continue PPI  DVT prophylaxis: heparin infusion Code Status: full code Family Communication: discussed with family at the bedside Disposition Plan: transfer to [facility] for further evaluation, likely cardiac catheterization  Consultants:   cardiology  Procedures:  Echo: - Left ventricle: The cavity size was normal. Wall thickness was   increased in a pattern of mild LVH. The estimated ejection   fraction was 45%. There is hypokinesis of the basal-midinferior   and inferoseptal myocardium. Doppler parameters are consistent   with abnormal left ventricular relaxation (grade 1 diastolic   dysfunction). - Aortic valve: Poorly visualized. Moderately calcified annulus.   Mildly calcified leaflets. - Mitral valve: Mildly calcified annulus. There was trivial   regurgitation. - Right atrium: Central venous pressure (est): 3 mm Hg. - Atrial septum: No defect or patent foramen ovale was identified. - Tricuspid valve: There was trivial regurgitation. - Pulmonary arteries: Systolic pressure could not be accurately   estimated.  - Pericardium, extracardiac: There was no pericardial effusion.  Antimicrobials:   levaquin 4/22>    Subjective: Pt NPO and ready for cath today, He has chest pressure but no pain.  No SOB.    Objective: Vitals:   10/09/17 0400 10/09/17 0500 10/09/17 0600 10/09/17 0700  BP:  111/78 100/65 129/90 (!) 145/67  Pulse: 78 72 72 93  Resp: Temp: 98.8 F (37.1 C)     TempSrc: Oral     SpO2: 95% 97% 93% 97%  Weight:  88.5 kg (195 lb 1.7 oz)    Height:         Intake/Output Summary (Last 24 hours) at 10/09/2017 0811 Last data filed at 10/09/2017 1610 Gross per 24 hour  Intake 1259.8 ml  Output 1425 ml  Net -165.2 ml   Filed Weights   10/07/17 0332 10/08/17 0500 10/09/17 0500  Weight: 87.5 kg (193 lb) 88.5 kg (195 lb 1.7 oz) 88.5 kg (195 lb 1.7 oz)    Examination:  General exam: Appears calm and comfortable  Respiratory system: BBS CTA. Respiratory effort normal. Cardiovascular system: S1 & S2 heard, RRR. No JVD, murmurs, rubs, gallops or clicks. No pedal edema. Gastrointestinal system: Abdomen is nondistended, soft and nontender. No organomegaly or masses felt. Normal bowel sounds heard. Central nervous system: Alert and oriented. No focal neurological deficits. Extremities: Symmetric 5 x 5 power. Skin: No rashes, lesions or ulcers Psychiatry: Judgement and insight appear normal. Mood & affect appropriate.     Data Reviewed: I have personally reviewed following labs and imaging studies  CBC: Recent Labs  Lab 10/07/17 0326 10/07/17 0352 10/08/17 0450 10/09/17 0404  WBC 16.3*  --  18.6* 11.9*  NEUTROABS 10.2*  --   --   --   HGB 16.6 15.6 14.4 13.8  HCT 49.1 46.0 44.6 41.9  MCV 84.4  --  86.4 86.6  PLT 217  --  229 210   Basic Metabolic Panel: Recent Labs  Lab 10/07/17 0326 10/07/17 0352 10/08/17 0450 10/09/17 0404  NA 141 140 141 139  K 3.2* 3.0* 4.4 4.2  CL 104 101 105 106  CO2 22  --  23 23  GLUCOSE 410* 396* 152* 147*  BUN 17 16 40* 36*  CREATININE 0.54* 0.50* 0.98 0.72  CALCIUM 9.0  --  9.0 8.9  MG 1.4*  --   --   --    GFR: Estimated Creatinine Clearance: 97.6 mL/min (by C-G formula based on SCr of 0.72 mg/dL). Liver Function Tests: No results for input(s): AST, ALT, ALKPHOS, BILITOT, PROT, ALBUMIN in the last 168 hours. No results for input(s): LIPASE, AMYLASE in the last 168 hours. No results for input(s): AMMONIA in the last 168 hours. Coagulation Profile: No results for input(s): INR, PROTIME in  the last 168 hours. Cardiac Enzymes: Recent Labs  Lab 10/07/17 0326 10/07/17 0607 10/07/17 1142 10/07/17 1726 10/08/17 0919  TROPONINI <0.03 0.20* 1.01* 1.02* 0.57*   BNP (last 3 results) No results for input(s): PROBNP in the last 8760 hours. HbA1C: No results for input(s): HGBA1C in the last 72 hours. CBG: Recent Labs  Lab 10/08/17 1610 10/08/17 1951 10/08/17 2339 10/09/17 0344 10/09/17 0801  GLUCAP 173* 148* 141* 151* 140*   Lipid Profile: No results for input(s): CHOL, HDL, LDLCALC, TRIG, CHOLHDL, LDLDIRECT in the last 72 hours. Thyroid Function Tests: No results for input(s): TSH, T4TOTAL, FREET4, T3FREE, THYROIDAB in the last 72 hours. Anemia Panel: No results for input(s): VITAMINB12, FOLATE, FERRITIN, TIBC, IRON, RETICCTPCT in the last 72 hours. Sepsis Labs: No results for input(s): PROCALCITON, LATICACIDVEN in the last 168 hours.  Recent Results (from the past 240 hour(s))  MRSA PCR Screening     Status: None   Collection Time: 10/07/17  8:52 AM  Result Value Ref  Range Status   MRSA by PCR NEGATIVE NEGATIVE Final    Comment:        The GeneXpert MRSA Assay (FDA approved for NASAL specimens only), is one component of a comprehensive MRSA colonization surveillance program. It is not intended to diagnose MRSA infection nor to guide or monitor treatment for MRSA infections. Performed at [facility], [address] [place], [place] [address]      Radiology Studies: No results found.  Scheduled Meds: . allopurinol  100 mg Oral Daily  . amLODipine  10 mg Oral Daily  . aspirin  81 mg Oral Daily  . atorvastatin  40 mg Oral Daily  . guaiFENesin  1,200 mg Oral BID  . insulin aspart  0-20 Units Subcutaneous Q4H  . insulin aspart protamine- aspart  50 Units Subcutaneous BID WC  . ipratropium-albuterol  3 mL Nebulization BID  . isosorbide mononitrate  120 mg Oral Daily  . losartan  100 mg Oral Daily  . metoprolol succinate  100 mg Oral BID  .  pantoprazole  40 mg Oral Daily  . sodium chloride flush  3 mL Intravenous Q12H   Continuous Infusions: . sodium chloride    . heparin 1,650 Units/hr (10/08/17 2216)  . levofloxacin (LEVAQUIN) IV Stopped (10/08/17 1656)    LOS: 2 days    Time spent:  Standley Dakins, MD [facility] Pager [phone]  If 7PM-7AM, please contact night-coverage [email or web address] Password [email or web address] 10/09/2017, 8:11 AM

## 2017-10-09 NOTE — Progress Notes (Signed)
ANTICOAGULATION CONSULT NOTE -  Pharmacy Consult for HEPARIN Indication: chest pain/ACS  Allergies  Allergen Reactions  . Bee Venom Swelling  . Penicillins Itching   Patient Measurements: Height: 5\' 10"  (177.8 cm) Weight: 195 lb 1.7 oz (88.5 kg) IBW/kg (Calculated) : 73 HEPARIN DW (KG): 87.5  Vital Signs: Temp: 98.8 F (37.1 C) (04/24 0400) Temp Source: Oral (04/24 0400) BP: 105/65 (04/24 0330) Pulse Rate: 72 (04/24 0330)  Labs: Recent Labs    10/07/17 0326 10/07/17 0352  10/07/17 1142 10/07/17 1726 10/08/17 0450 10/08/17 0919 10/08/17 1837 10/09/17 0404  HGB 16.6 15.6  --   --   --  14.4  --   --  13.8  HCT 49.1 46.0  --   --   --  44.6  --   --  41.9  PLT 217  --   --   --   --  229  --   --  210  HEPARINUNFRC  --   --   --   --   --   --  <0.10* 0.12* 0.33  CREATININE 0.54* 0.50*  --   --   --  0.98  --   --  0.72  TROPONINI <0.03  --    < > 1.01* 1.02*  --  0.57*  --   --    < > = values in this interval not displayed.   Estimated Creatinine Clearance: 97.6 mL/min (by C-G formula based on SCr of 0.72 mg/dL).  Medical History: Past Medical History:  Diagnosis Date  . Coronary artery disease    a. Cath: 2000 nonobs dz. b. 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 s/p BMS to LAD & RCA ; s/p PCI/DES dRCA & PDA x2 2011. d. cath 02/17/14: LM patent, LAD calcified, diffuse irregs, no high grade stenosis mLAD stent patent, LCx, diffuse irregs no high-grade dz, RCA total occ w/ L to R collats, medical Rx rec  . Diabetes mellitus   . Diastolic dysfunction    a. echo 02/18/14: EF 45-50%, mild LVH, AK of inferior and inferoseptal myocardium, GR1DD  . Hiatal hernia   . Hyperlipidemia   . PVD (peripheral vascular disease) (HCC)    ABIs of 0.69 on right and 0.93 on left  . Renal artery stenosis (HCC)    Mild, bilateral  . Sleep apnea    Mild   Medications:  Medications Prior to Admission  Medication Sig Dispense Refill Last Dose  .  allopurinol (ZYLOPRIM) 100 MG tablet Take 100 mg by mouth daily.     10/06/2017 at Unknown time  . amLODipine (NORVASC) 10 MG tablet Take 1 tablet (10 mg total) by mouth daily. 90 tablet 3 10/06/2017 at Unknown time  . aspirin 81 MG tablet Take 81 mg by mouth daily.     10/06/2017 at Unknown time  . fenofibrate (TRICOR) 145 MG tablet TAKE 1 TABLET BY MOUTH EVERY DAY 30 tablet 11 10/06/2017 at Unknown time  . Fish Oil OIL Take 1 capsule by mouth daily.     Past Month at Unknown time  . hydrochlorothiazide (MICROZIDE) 12.5 MG capsule Take 12.5 mg by mouth daily.   10/06/2017 at Unknown time  . insulin NPH-regular Human (NOVOLIN 70/30) (70-30) 100 UNIT/ML injection Inject 48 Units into the skin 2 (two) times daily.   10/06/2017 at Unknown time  . isosorbide mononitrate (IMDUR) 120 MG 24 hr tablet Take 1 tablet (120 mg total) by mouth daily. 90  tablet 3 10/06/2017 at Unknown time  . losartan (COZAAR) 100 MG tablet Take 100 mg by mouth daily.   10/06/2017 at Unknown time  . metFORMIN (GLUCOPHAGE) 500 MG tablet Take 1,000 mg by mouth 2 (two) times daily.   10/06/2017 at Unknown time  . metoprolol (TOPROL-XL) 200 MG 24 hr tablet Take 0.5 tablets (100 mg total) by mouth 2 (two) times daily. 90 tablet 3 10/06/2017 at Unknown time  . metoprolol tartrate (LOPRESSOR) 100 MG tablet TAKE 1 TABLET BY MOUTH TWICE A DAY 60 tablet 5 10/06/2017 at 0300  . omeprazole (PRILOSEC) 20 MG capsule Take 20 mg by mouth daily.   10/06/2017 at Unknown time  . atorvastatin (LIPITOR) 40 MG tablet Take 40 mg by mouth daily.   More than a month at Unknown time  . nitroGLYCERIN (NITROSTAT) 0.4 MG SL tablet Place 1 tablet (0.4 mg total) under the tongue every 5 (five) minutes as needed for chest pain. 25 tablet 11 More than a month at Unknown time   Assessment: 72yo male CAD status post previous PCI to the RCA and LAD with most recent cardiac catheterization in 2015 showing occlusion of the mid RCA associated with left to right collaterals,  patent mid LAD stent with nonobstructive LAD/diagonal stenosis, and mild atherosclerosis.  Pharmacy asked to start Heparin.  Heparin level now therapeutic.    Goal of Therapy:  Heparin level 0.3-0.7 units/ml Monitor platelets by anticoagulation protocol: Yes   Plan:  Continue Heparin infusion at 1650 units/hr Heparin level daily CBC daily while on Heparin  Valrie Hart, PharmD Clinical Pharmacist Pager:  [phone] 10/09/2017  10/09/2017 7:52 AM

## 2017-10-09 NOTE — H&P (View-Only) (Signed)
Progress Note  [name] Date of Encounter: 10/09/2017  Primary Cardiologist: Dr. Rollene Rotunda  Subjective   Breathing is better.  He reports no current chest tightness.  No palpitations.  Slept well.  Inpatient Medications    Scheduled Meds: . allopurinol  100 mg Oral Daily  . amLODipine  10 mg Oral Daily  . aspirin  81 mg Oral Daily  . atorvastatin  40 mg Oral Daily  . guaiFENesin  1,200 mg Oral BID  . insulin aspart  0-20 Units Subcutaneous Q4H  . insulin aspart protamine- aspart  50 Units Subcutaneous BID WC  . ipratropium-albuterol  3 mL Nebulization BID  . isosorbide mononitrate  120 mg Oral Daily  . losartan  100 mg Oral Daily  . metoprolol succinate  100 mg Oral BID  . pantoprazole  40 mg Oral Daily  . sodium chloride flush  3 mL Intravenous Q12H   Continuous Infusions: . sodium chloride    . heparin 1,650 Units/hr (10/08/17 2216)  . levofloxacin (LEVAQUIN) IV Stopped (10/08/17 1656)   PRN Meds: sodium chloride, acetaminophen **OR** acetaminophen, ipratropium-albuterol, ondansetron **OR** ondansetron (ZOFRAN) IV, sodium chloride flush   Vital Signs    Vitals:   10/09/17 0500 10/09/17 0600 10/09/17 0700 10/09/17 0820  BP: 100/65 129/90 (!) 145/67   Pulse: 72 72 93   Resp: 16 12 15    Temp:      TempSrc:      SpO2: 97% 93% 97% 96%  Weight: 195 lb 1.7 oz (88.5 kg)     Height:        Intake/Output Summary (Last 24 hours) at 10/09/2017 0830 Last data filed at 10/09/2017 0815 Gross per 24 hour  Intake 1259.8 ml  Output 1950 ml  Net -690.2 ml   Filed Weights   10/07/17 0332 10/08/17 0500 10/09/17 0500  Weight: 193 lb (87.5 kg) 195 lb 1.7 oz (88.5 kg) 195 lb 1.7 oz (88.5 kg)    Telemetry    Sinus rhythm.  Personally reviewed.  ECG    Tracing from 10/07/2017 showed sinus tachycardia with diffuse ST-T wave abnormalities, consider ischemia.  Personally reviewed.  Physical Exam   GEN: No acute distress.   Neck: No  JVD. Cardiac: RRR, 2/6 systolic murmur, no gallop.  Respiratory: Nonlabored.  No active wheezing. GI: Soft, nontender, bowel sounds present. MS: No edema; No deformity. Neuro:  Nonfocal. Psych: Alert and oriented x 3. Normal affect.  Labs    Chemistry Recent Labs  Lab 10/07/17 0326 10/07/17 0352 10/08/17 0450 10/09/17 0404  NA 141 140 141 139  K 3.2* 3.0* 4.4 4.2  CL 104 101 105 106  CO2 22  --  23 23  GLUCOSE 410* 396* 152* 147*  BUN 17 16 40* 36*  CREATININE 0.54* 0.50* 0.98 0.72  CALCIUM 9.0  --  9.0 8.9  GFRNONAA >60  --  >60 >60  GFRAA >60  --  >60 >60  ANIONGAP 15  --  13 10     Hematology Recent Labs  Lab 10/07/17 0326 10/07/17 0352 10/08/17 0450 10/09/17 0404  WBC 16.3*  --  18.6* 11.9*  RBC 5.82*  --  5.16 4.84  HGB 16.6 15.6 14.4 13.8  HCT 49.1 46.0 44.6 41.9  MCV 84.4  --  86.4 86.6  MCH 28.5  --  27.9 28.5  MCHC 33.8  --  32.3 32.9  RDW 13.6  --  14.0 14.1  PLT 217  --  229 210  Cardiac Enzymes Recent Labs  Lab 10/07/17 0607 10/07/17 1142 10/07/17 1726 10/08/17 0919  TROPONINI 0.20* 1.01* 1.02* 0.57*    Recent Labs  Lab 10/07/17 0349  TROPIPOC 0.01     BNP Recent Labs  Lab 10/07/17 0326  BNP 136.0*     DDimer  Recent Labs  Lab 10/07/17 0326  DDIMER 0.50     Radiology    No results found.  Cardiac Studies   Echocardiogram 10/07/2017: Study Conclusions  - Left ventricle: The cavity size was normal. Wall thickness was   increased in a pattern of mild LVH. The estimated ejection   fraction was 45%. There is hypokinesis of the basal-midinferior   and inferoseptal myocardium. Doppler parameters are consistent   with abnormal left ventricular relaxation (grade 1 diastolic   dysfunction). - Aortic valve: Poorly visualized. Moderately calcified annulus.   Mildly calcified leaflets. - Mitral valve: Mildly calcified annulus. There was trivial   regurgitation. - Right atrium: Central venous pressure (est): 3 mm Hg. -  Atrial septum: No defect or patent foramen ovale was identified. - Tricuspid valve: There was trivial regurgitation. - Pulmonary arteries: Systolic pressure could not be accurately   estimated. - Pericardium, extracardiac: There was no pericardial effusion.  Patient Profile     72 y.o. 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  He is admitted with acute hypoxic respiratory failure in the setting of pulmonary edema.  Possible pneumonia noted by chest x-ray but currently afebrile on antibiotics.  Troponin I peaked at 1.02 consistent with NSTEMI.  Follow-up echocardiogram shows LVEF approximately 45% with mid to basal inferior/inferoseptal hypokinesis.  Assessment & Plan    1.  NSTEMI with peak troponin I of 1.02.  Type I versus type II event, presenting with pulmonary edema and acute hypoxic respiratory failure but also question of infiltrate by chest x-ray.  He is afebrile on antibiotics.  Resolving leukocytosis.  Follow-up echocardiogram shows LVEF approximately 45% with mid to basal inferior/inferoseptal hypokinesis.  Breathing more comfortably with diuresis.  2. 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  Patient reported to be a nonresponder to Plavix.  3.  Sinus tachycardia, heart rate improved.  He has had fast  heart rates documented as an outpatient, question whether he has been compliant with beta-blocker.  4.  Mixed hyperlipidemia, on Lipitor.  5.  History of PAD with mild renal artery stenosis.  6.  Type 2 diabetes mellitus, glucose coming under better control, managed by primary team.  Reviewed interval hospital course since assessment on Monday.  Patient is breathing much more easily at this point, no active chest tightness.  He is afebrile with resolving leukocytosis as well.  Would repeat ECG now that heart rate has slowed down.  Continue aspirin, Norvasc, Lipitor, Imdur, Cozaar, and Toprol-XL.  He is also on IV heparin.  Currently n.p.o., would suggest transfer to [facility] today  for cardiac catheterization, may be able to progress to telemetry unit thereafter depending on findings.  Signed, Nona Dell, MD  10/09/2017, 8:30 AM

## 2017-10-09 NOTE — Progress Notes (Signed)
Progress Note  [name] Date of Encounter: 10/09/2017  Primary Cardiologist: Dr. Rollene Rotunda  Subjective   Breathing is better.  He reports no current chest tightness.  No palpitations.  Slept well.  Inpatient Medications    Scheduled Meds: . allopurinol  100 mg Oral Daily  . amLODipine  10 mg Oral Daily  . aspirin  81 mg Oral Daily  . atorvastatin  40 mg Oral Daily  . guaiFENesin  1,200 mg Oral BID  . insulin aspart  0-20 Units Subcutaneous Q4H  . insulin aspart protamine- aspart  50 Units Subcutaneous BID WC  . ipratropium-albuterol  3 mL Nebulization BID  . isosorbide mononitrate  120 mg Oral Daily  . losartan  100 mg Oral Daily  . metoprolol succinate  100 mg Oral BID  . pantoprazole  40 mg Oral Daily  . sodium chloride flush  3 mL Intravenous Q12H   Continuous Infusions: . sodium chloride    . heparin 1,650 Units/hr (10/08/17 2216)  . levofloxacin (LEVAQUIN) IV Stopped (10/08/17 1656)   PRN Meds: sodium chloride, acetaminophen **OR** acetaminophen, ipratropium-albuterol, ondansetron **OR** ondansetron (ZOFRAN) IV, sodium chloride flush   Vital Signs    Vitals:   10/09/17 0500 10/09/17 0600 10/09/17 0700 10/09/17 0820  BP: 100/65 129/90 (!) 145/67   Pulse: 72 72 93   Resp: 16 12 15    Temp:      TempSrc:      SpO2: 97% 93% 97% 96%  Weight: 195 lb 1.7 oz (88.5 kg)     Height:        Intake/Output Summary (Last 24 hours) at 10/09/2017 0830 Last data filed at 10/09/2017 0815 Gross per 24 hour  Intake 1259.8 ml  Output 1950 ml  Net -690.2 ml   Filed Weights   10/07/17 0332 10/08/17 0500 10/09/17 0500  Weight: 193 lb (87.5 kg) 195 lb 1.7 oz (88.5 kg) 195 lb 1.7 oz (88.5 kg)    Telemetry    Sinus rhythm.  Personally reviewed.  ECG    Tracing from 10/07/2017 showed sinus tachycardia with diffuse ST-T wave abnormalities, consider ischemia.  Personally reviewed.  Physical Exam   GEN: No acute distress.   Neck: No  JVD. Cardiac: RRR, 2/6 systolic murmur, no gallop.  Respiratory: Nonlabored.  No active wheezing. GI: Soft, nontender, bowel sounds present. MS: No edema; No deformity. Neuro:  Nonfocal. Psych: Alert and oriented x 3. Normal affect.  Labs    Chemistry Recent Labs  Lab 10/07/17 0326 10/07/17 0352 10/08/17 0450 10/09/17 0404  NA 141 140 141 139  K 3.2* 3.0* 4.4 4.2  CL 104 101 105 106  CO2 22  --  23 23  GLUCOSE 410* 396* 152* 147*  BUN 17 16 40* 36*  CREATININE 0.54* 0.50* 0.98 0.72  CALCIUM 9.0  --  9.0 8.9  GFRNONAA >60  --  >60 >60  GFRAA >60  --  >60 >60  ANIONGAP 15  --  13 10     Hematology Recent Labs  Lab 10/07/17 0326 10/07/17 0352 10/08/17 0450 10/09/17 0404  WBC 16.3*  --  18.6* 11.9*  RBC 5.82*  --  5.16 4.84  HGB 16.6 15.6 14.4 13.8  HCT 49.1 46.0 44.6 41.9  MCV 84.4  --  86.4 86.6  MCH 28.5  --  27.9 28.5  MCHC 33.8  --  32.3 32.9  RDW 13.6  --  14.0 14.1  PLT 217  --  229 210  Cardiac Enzymes Recent Labs  Lab 10/07/17 0607 10/07/17 1142 10/07/17 1726 10/08/17 0919  TROPONINI 0.20* 1.01* 1.02* 0.57*    Recent Labs  Lab 10/07/17 0349  TROPIPOC 0.01     BNP Recent Labs  Lab 10/07/17 0326  BNP 136.0*     DDimer  Recent Labs  Lab 10/07/17 0326  DDIMER 0.50     Radiology    No results found.  Cardiac Studies   Echocardiogram 10/07/2017: Study Conclusions  - Left ventricle: The cavity size was normal. Wall thickness was   increased in a pattern of mild LVH. The estimated ejection   fraction was 45%. There is hypokinesis of the basal-midinferior   and inferoseptal myocardium. Doppler parameters are consistent   with abnormal left ventricular relaxation (grade 1 diastolic   dysfunction). - Aortic valve: Poorly visualized. Moderately calcified annulus.   Mildly calcified leaflets. - Mitral valve: Mildly calcified annulus. There was trivial   regurgitation. - Right atrium: Central venous pressure (est): 3 mm Hg. -  Atrial septum: No defect or patent foramen ovale was identified. - Tricuspid valve: There was trivial regurgitation. - Pulmonary arteries: Systolic pressure could not be accurately   estimated. - Pericardium, extracardiac: There was no pericardial effusion.  Patient Profile     72 y.o. 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  He is admitted with acute hypoxic respiratory failure in the setting of pulmonary edema.  Possible pneumonia noted by chest x-ray but currently afebrile on antibiotics.  Troponin I peaked at 1.02 consistent with NSTEMI.  Follow-up echocardiogram shows LVEF approximately 45% with mid to basal inferior/inferoseptal hypokinesis.  Assessment & Plan    1.  NSTEMI with peak troponin I of 1.02.  Type I versus type II event, presenting with pulmonary edema and acute hypoxic respiratory failure but also question of infiltrate by chest x-ray.  He is afebrile on antibiotics.  Resolving leukocytosis.  Follow-up echocardiogram shows LVEF approximately 45% with mid to basal inferior/inferoseptal hypokinesis.  Breathing more comfortably with diuresis.  2. 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  Patient reported to be a nonresponder to Plavix.  3.  Sinus tachycardia, heart rate improved.  He has had fast  heart rates documented as an outpatient, question whether he has been compliant with beta-blocker.  4.  Mixed hyperlipidemia, on Lipitor.  5.  History of PAD with mild renal artery stenosis.  6.  Type 2 diabetes mellitus, glucose coming under better control, managed by primary team.  Reviewed interval hospital course since assessment on Monday.  Patient is breathing much more easily at this point, no active chest tightness.  He is afebrile with resolving leukocytosis as well.  Would repeat ECG now that heart rate has slowed down.  Continue aspirin, Norvasc, Lipitor, Imdur, Cozaar, and Toprol-XL.  He is also on IV heparin.  Currently n.p.o., would suggest transfer to [facility] today  for cardiac catheterization, may be able to progress to telemetry unit thereafter depending on findings.  Signed, Nona Dell, MD  10/09/2017, 8:30 AM

## 2017-10-09 NOTE — Progress Notes (Signed)
ANTICOAGULATION CONSULT NOTE -  Pharmacy Consult for HEPARIN Indication: chest pain/ACS  Allergies  Allergen Reactions  . Bee Venom Swelling  . Penicillins Itching   Patient Measurements: Height: 5\' 10"  (177.8 cm) Weight: 195 lb 1.7 oz (88.5 kg) IBW/kg (Calculated) : 73 HEPARIN DW (KG): 87.5  Vital Signs: Temp: 98.8 F (37.1 C) (04/24 1100) Temp Source: Oral (04/24 0820) BP: 146/83 (04/24 1720) Pulse Rate: 76 (04/24 1720)  Labs: Recent Labs    10/07/17 0326 10/07/17 0352  10/07/17 1142 10/07/17 1726 10/08/17 0450 10/08/17 0919 10/08/17 1837 10/09/17 0404  HGB 16.6 15.6  --   --   --  14.4  --   --  13.8  HCT 49.1 46.0  --   --   --  44.6  --   --  41.9  PLT 217  --   --   --   --  229  --   --  210  HEPARINUNFRC  --   --   --   --   --   --  <0.10* 0.12* 0.33  CREATININE 0.54* 0.50*  --   --   --  0.98  --   --  0.72  TROPONINI <0.03  --    < > 1.01* 1.02*  --  0.57*  --   --    < > = values in this interval not displayed.   Estimated Creatinine Clearance: 97.6 mL/min (by C-G formula based on SCr of 0.72 mg/dL).  Medical History: Past Medical History:  Diagnosis Date  . Coronary artery disease    a. Cath: 2000 nonobs dz. b. 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 s/p BMS to LAD & RCA ; s/p PCI/DES dRCA & PDA x2 2011. d. cath 02/17/14: LM patent, LAD calcified, diffuse irregs, no high grade stenosis mLAD stent patent, LCx, diffuse irregs no high-grade dz, RCA total occ w/ L to R collats, medical Rx rec  . Diabetes mellitus   . Diastolic dysfunction    a. echo 02/18/14: EF 45-50%, mild LVH, AK of inferior and inferoseptal myocardium, GR1DD  . Hiatal hernia   . Hyperlipidemia   . PVD (peripheral vascular disease) (HCC)    ABIs of 0.69 on right and 0.93 on left  . Renal artery stenosis (HCC)    Mild, bilateral  . Sleep apnea    Mild   Medications:  Medications Prior to Admission  Medication Sig Dispense Refill Last Dose  .  allopurinol (ZYLOPRIM) 100 MG tablet Take 100 mg by mouth daily.     10/06/2017 at Unknown time  . amLODipine (NORVASC) 10 MG tablet Take 1 tablet (10 mg total) by mouth daily. 90 tablet 3 10/06/2017 at Unknown time  . aspirin 81 MG tablet Take 81 mg by mouth daily.     10/06/2017 at Unknown time  . fenofibrate (TRICOR) 145 MG tablet TAKE 1 TABLET BY MOUTH EVERY DAY 30 tablet 11 10/06/2017 at Unknown time  . Fish Oil OIL Take 1 capsule by mouth daily.     Past Month at Unknown time  . hydrochlorothiazide (MICROZIDE) 12.5 MG capsule Take 12.5 mg by mouth daily.   10/06/2017 at Unknown time  . insulin NPH-regular Human (NOVOLIN 70/30) (70-30) 100 UNIT/ML injection Inject 48 Units into the skin 2 (two) times daily.   10/06/2017 at Unknown time  . isosorbide mononitrate (IMDUR) 120 MG 24 hr tablet Take 1 tablet (120 mg total) by mouth daily. 90  tablet 3 10/06/2017 at Unknown time  . losartan (COZAAR) 100 MG tablet Take 100 mg by mouth daily.   10/06/2017 at Unknown time  . metFORMIN (GLUCOPHAGE) 500 MG tablet Take 1,000 mg by mouth 2 (two) times daily.   10/06/2017 at Unknown time  . metoprolol (TOPROL-XL) 200 MG 24 hr tablet Take 0.5 tablets (100 mg total) by mouth 2 (two) times daily. 90 tablet 3 10/06/2017 at Unknown time  . metoprolol tartrate (LOPRESSOR) 100 MG tablet TAKE 1 TABLET BY MOUTH TWICE A DAY 60 tablet 5 10/06/2017 at 0300  . omeprazole (PRILOSEC) 20 MG capsule Take 20 mg by mouth daily.   10/06/2017 at Unknown time  . atorvastatin (LIPITOR) 40 MG tablet Take 40 mg by mouth daily.   More than a month at Unknown time  . nitroGLYCERIN (NITROSTAT) 0.4 MG SL tablet Place 1 tablet (0.4 mg total) under the tongue every 5 (five) minutes as needed for chest pain. 25 tablet 11 More than a month at Unknown time   Assessment: 72yo male CAD status post previous PCI to the RCA and LAD with most recent cardiac catheterization in 2015 showing occlusion of the mid RCA associated with left to right collaterals,  patent mid LAD stent with nonobstructive LAD/diagonal stenosis, and mild atherosclerosis.  Pharmacy asked to restart Heparin 8 hours after sheath removal (1720) Last heparin level 0.33 on 1650 units/hr.    Goal of Therapy:  Heparin level 0.3-0.7 units/ml Monitor platelets by anticoagulation protocol: Yes   Plan:  Restart Heparin infusion at 1650 units/hr at 0120 on 4/25 Heparin level 6 hours after restart CBC daily while on Heparin  Zaccai Chavarin A. Jeanella Craze, PharmD, BCPS Clinical Pharmacist [place] Pager: [phone]  10/09/2017  10/09/2017 5:46 PM

## 2017-10-10 ENCOUNTER — Encounter (HOSPITAL_COMMUNITY)
Admission: EM | Disposition: A | Discharge status: Home / Self Care | Payer: Self-pay | Source: Home / Self Care | Attending: Internal Medicine

## 2017-10-10 ENCOUNTER — Encounter (HOSPITAL_COMMUNITY): Payer: Self-pay | Admitting: Cardiology

## 2017-10-10 DIAGNOSIS — I2510 Atherosclerotic heart disease of native coronary artery without angina pectoris: Secondary | ICD-10-CM

## 2017-10-10 HISTORY — PX: CORONARY STENT INTERVENTION: CATH118234

## 2017-10-10 HISTORY — PX: CORONARY ATHERECTOMY: CATH118238

## 2017-10-10 LAB — POCT ACTIVATED CLOTTING TIME
ACTIVATED CLOTTING TIME: 252 s
ACTIVATED CLOTTING TIME: 285 s
Activated Clotting Time: 246 seconds
Activated Clotting Time: 279 seconds
Activated Clotting Time: 241 seconds
Activated Clotting Time: 142 seconds

## 2017-10-10 LAB — BASIC METABOLIC PANEL
Anion gap: 11 (ref 5–15)
BUN: 18 mg/dL (ref 6–20)
CALCIUM: 8.7 mg/dL — AB (ref 8.9–10.3)
CHLORIDE: 107 mmol/L (ref 101–111)
CO2: 20 mmol/L — ABNORMAL LOW (ref 22–32)
CREATININE: 0.68 mg/dL (ref 0.61–1.24)
Glucose, Bld: 192 mg/dL — ABNORMAL HIGH (ref 65–99)
Potassium: 4.1 mmol/L (ref 3.5–5.1)
SODIUM: 138 mmol/L (ref 135–145)

## 2017-10-10 LAB — CBC
HEMATOCRIT: 43.6 % (ref 39.0–52.0)
Hemoglobin: 14.4 g/dL (ref 13.0–17.0)
MCH: 28.1 pg (ref 26.0–34.0)
MCHC: 33 g/dL (ref 30.0–36.0)
MCV: 85 fL (ref 78.0–100.0)
Platelets: 197 10*3/uL (ref 150–400)
RBC: 5.13 MIL/uL (ref 4.22–5.81)
RDW: 14 % (ref 11.5–15.5)
WBC: 10.9 10*3/uL — AB (ref 4.0–10.5)

## 2017-10-10 LAB — GLUCOSE, CAPILLARY
GLUCOSE-CAPILLARY: 158 mg/dL — AB (ref 65–99)
GLUCOSE-CAPILLARY: 160 mg/dL — AB (ref 65–99)
GLUCOSE-CAPILLARY: 164 mg/dL — AB (ref 65–99)
GLUCOSE-CAPILLARY: 184 mg/dL — AB (ref 65–99)
Glucose-Capillary: 171 mg/dL — ABNORMAL HIGH (ref 65–99)
Glucose-Capillary: 155 mg/dL — ABNORMAL HIGH (ref 65–99)

## 2017-10-10 LAB — HEPARIN LEVEL (UNFRACTIONATED): HEPARIN UNFRACTIONATED: 0.19 [IU]/mL — AB (ref 0.30–0.70)

## 2017-10-10 SURGERY — CORONARY ATHERECTOMY
Anesthesia: LOCAL

## 2017-10-10 MED ORDER — FENTANYL CITRATE (PF) 100 MCG/2ML IJ SOLN
INTRAMUSCULAR | Status: AC
  Filled 2017-10-10: qty 2

## 2017-10-10 MED ORDER — SODIUM CHLORIDE 0.9% FLUSH: 3.0000 mL | INTRAVENOUS | Status: DC | PRN

## 2017-10-10 MED ORDER — SODIUM CHLORIDE 0.9 % IV SOLN: 250.0000 mL | INTRAVENOUS | Status: DC | PRN

## 2017-10-10 MED ORDER — MIDAZOLAM HCL 2 MG/2ML IJ SOLN
INTRAMUSCULAR | Status: AC
  Filled 2017-10-10: qty 2

## 2017-10-10 MED ORDER — SODIUM CHLORIDE 0.9% FLUSH
3.0000 mL | Freq: Two times a day (BID) | INTRAVENOUS | Status: DC
  Administered 2017-10-10 – 2017-10-11 (×3): 3 mL via INTRAVENOUS

## 2017-10-10 MED ORDER — PRASUGREL HCL 10 MG PO TABS
10.0000 mg | ORAL_TABLET | Freq: Every day | ORAL | Status: DC
  Filled 2017-10-10: qty 1

## 2017-10-10 MED ORDER — HEPARIN (PORCINE) IN NACL 1000-0.9 UT/500ML-% IV SOLN
INTRAVENOUS | Status: AC
  Filled 2017-10-10: qty 1000

## 2017-10-10 MED ORDER — ASPIRIN 81 MG PO CHEW
81.0000 mg | CHEWABLE_TABLET | ORAL | Status: AC
  Administered 2017-10-10: 81 mg via ORAL
  Filled 2017-10-10: qty 1

## 2017-10-10 MED ORDER — HEPARIN SODIUM (PORCINE) 1000 UNIT/ML IJ SOLN
INTRAMUSCULAR | Status: AC
  Filled 2017-10-10: qty 1

## 2017-10-10 MED ORDER — SODIUM CHLORIDE 0.9% FLUSH: 3.0000 mL | Freq: Two times a day (BID) | INTRAVENOUS | Status: DC

## 2017-10-10 MED ORDER — ALPRAZOLAM 0.25 MG PO TABS
0.2500 mg | ORAL_TABLET | Freq: Once | ORAL | Status: AC
  Administered 2017-10-10: 0.25 mg via ORAL
  Filled 2017-10-10: qty 1

## 2017-10-10 MED ORDER — HEPARIN (PORCINE) IN NACL 2-0.9 UNITS/ML
INTRAMUSCULAR | Status: AC | PRN
  Administered 2017-10-10 (×2): 500 mL

## 2017-10-10 MED ORDER — PRASUGREL HCL 10 MG PO TABS: 10.0000 mg | ORAL_TABLET | Freq: Every day | ORAL | Status: DC

## 2017-10-10 MED ORDER — SODIUM CHLORIDE 0.9 % WEIGHT BASED INFUSION
3.0000 mL/kg/h | INTRAVENOUS | Status: DC
  Administered 2017-10-10: 3 mL/kg/h via INTRAVENOUS

## 2017-10-10 MED ORDER — NITROGLYCERIN 1 MG/10 ML FOR IR/CATH LAB
INTRA_ARTERIAL | Status: AC
  Filled 2017-10-10: qty 10

## 2017-10-10 MED ORDER — MORPHINE SULFATE (PF) 2 MG/ML IV SOLN
2.0000 mg | INTRAVENOUS | Status: DC | PRN
  Administered 2017-10-10: 16:00:00 2 mg via INTRAVENOUS
  Filled 2017-10-10: qty 1

## 2017-10-10 MED ORDER — MIDAZOLAM HCL 2 MG/2ML IJ SOLN
INTRAMUSCULAR | Status: DC | PRN
  Administered 2017-10-10 (×3): 1 mg via INTRAVENOUS

## 2017-10-10 MED ORDER — FUROSEMIDE 10 MG/ML IJ SOLN: 20.0000 mg | Freq: Once | INTRAMUSCULAR | Status: DC

## 2017-10-10 MED ORDER — HYDRALAZINE HCL 20 MG/ML IJ SOLN
5.0000 mg | INTRAMUSCULAR | Status: AC | PRN
  Administered 2017-10-10 (×4): 5 mg via INTRAVENOUS
  Filled 2017-10-10 (×4): qty 1

## 2017-10-10 MED ORDER — NITROGLYCERIN 1 MG/10 ML FOR IR/CATH LAB
INTRA_ARTERIAL | Status: DC | PRN
  Administered 2017-10-10 (×5): 200 ug via INTRACORONARY

## 2017-10-10 MED ORDER — SODIUM CHLORIDE 0.9 % WEIGHT BASED INFUSION: 1.0000 mL/kg/h | INTRAVENOUS | Status: DC

## 2017-10-10 MED ORDER — ENOXAPARIN SODIUM 40 MG/0.4ML ~~LOC~~ SOLN
40.0000 mg | SUBCUTANEOUS | Status: DC
  Filled 2017-10-10: qty 0.4

## 2017-10-10 MED ORDER — LABETALOL HCL 5 MG/ML IV SOLN
10.0000 mg | INTRAVENOUS | Status: AC | PRN
  Administered 2017-10-10 (×3): 10 mg via INTRAVENOUS
  Filled 2017-10-10 (×5): qty 4

## 2017-10-10 MED ORDER — LIDOCAINE HCL (PF) 1 % IJ SOLN
INTRAMUSCULAR | Status: DC | PRN
  Administered 2017-10-10: 15 mL

## 2017-10-10 MED ORDER — HEPARIN SODIUM (PORCINE) 1000 UNIT/ML IJ SOLN
INTRAMUSCULAR | Status: DC | PRN
  Administered 2017-10-10: 2000 [IU] via INTRAVENOUS
  Administered 2017-10-10: 8000 [IU] via INTRAVENOUS
  Administered 2017-10-10 (×2): 2000 [IU] via INTRAVENOUS

## 2017-10-10 MED ORDER — HYDRALAZINE HCL 20 MG/ML IJ SOLN
10.0000 mg | Freq: Once | INTRAMUSCULAR | Status: AC
  Administered 2017-10-10: 10 mg via INTRAVENOUS
  Filled 2017-10-10: qty 1

## 2017-10-10 MED ORDER — PRASUGREL HCL 10 MG PO TABS
60.0000 mg | ORAL_TABLET | Freq: Once | ORAL | Status: AC
  Administered 2017-10-10: 60 mg via ORAL
  Filled 2017-10-10: qty 6

## 2017-10-10 MED ORDER — IOPAMIDOL (ISOVUE-370) INJECTION 76%
INTRAVENOUS | Status: AC
  Filled 2017-10-10: qty 250

## 2017-10-10 MED ORDER — FENTANYL CITRATE (PF) 100 MCG/2ML IJ SOLN
INTRAMUSCULAR | Status: DC | PRN
  Administered 2017-10-10 (×3): 25 ug via INTRAVENOUS
  Administered 2017-10-10: 50 ug via INTRAVENOUS

## 2017-10-10 MED ORDER — LIDOCAINE HCL (PF) 1 % IJ SOLN
INTRAMUSCULAR | Status: AC
  Filled 2017-10-10: qty 30

## 2017-10-10 MED ORDER — IOPAMIDOL (ISOVUE-370) INJECTION 76%
INTRAVENOUS | Status: DC | PRN
  Administered 2017-10-10: 205 mL

## 2017-10-10 MED ORDER — FUROSEMIDE 10 MG/ML IJ SOLN
20.0000 mg | Freq: Once | INTRAMUSCULAR | Status: AC
  Administered 2017-10-10: 20 mg via INTRAVENOUS
  Filled 2017-10-10: qty 2

## 2017-10-10 MED ORDER — SODIUM CHLORIDE 0.9 % IV SOLN
INTRAVENOUS | Status: AC
  Administered 2017-10-10: 12:00:00 via INTRAVENOUS

## 2017-10-10 MED ORDER — ALPRAZOLAM 0.25 MG PO TABS
0.2500 mg | ORAL_TABLET | Freq: Three times a day (TID) | ORAL | Status: DC | PRN
  Administered 2017-10-10 (×2): 0.25 mg via ORAL
  Filled 2017-10-10 (×2): qty 1

## 2017-10-10 MED ORDER — INSULIN ASPART 100 UNIT/ML ~~LOC~~ SOLN
0.0000 [IU] | Freq: Three times a day (TID) | SUBCUTANEOUS | Status: DC
  Administered 2017-10-11: 4 [IU] via SUBCUTANEOUS
  Administered 2017-10-11: 12:00:00 3 [IU] via SUBCUTANEOUS

## 2017-10-10 SURGICAL SUPPLY — 27 items
BALLN SAPPHIRE 3.0X15 (BALLOONS) ×2
BALLN SAPPHIRE ~~LOC~~ 2.75X12 (BALLOONS) ×1 IMPLANT
BALLN SAPPHIRE ~~LOC~~ 3.5X12 (BALLOONS) ×1 IMPLANT
BALLN TREK OTW 2.25X12 (BALLOONS) ×2
BALLOON SAPPHIRE 3.0X15 (BALLOONS) IMPLANT
BALLOON TREK OTW 2.25X12 (BALLOONS) IMPLANT
CATH INFINITI JR4 5F (CATHETERS) ×1 IMPLANT
CATH LAUNCHER 6FR EBU 3 (CATHETERS) ×1 IMPLANT
CATH VISTA GUIDE 6FR XB3.5 (CATHETERS) ×1 IMPLANT
CROWN DIAMONDBACK CLASSIC 1.25 (BURR) ×1 IMPLANT
KIT ENCORE 26 ADVANTAGE (KITS) ×3 IMPLANT
KIT HEART LEFT (KITS) ×2 IMPLANT
KIT HEMO VALVE WATCHDOG (MISCELLANEOUS) ×1 IMPLANT
KIT MICROPUNCTURE NIT STIFF (SHEATH) ×1 IMPLANT
LUBRICANT VIPERSLIDE CORONARY (MISCELLANEOUS) ×1 IMPLANT
PACK CARDIAC CATHETERIZATION (CUSTOM PROCEDURE TRAY) ×2 IMPLANT
SHEATH PINNACLE 6F 10CM (SHEATH) ×1 IMPLANT
STENT PROMUS PREM MR 2.5X16 (Permanent Stent) ×1 IMPLANT
STENT PROMUS PREM MR 3.0X20 (Permanent Stent) ×1 IMPLANT
TRANSDUCER W/STOPCOCK (MISCELLANEOUS) ×2 IMPLANT
TUBING CIL FLEX 10 FLL-RA (TUBING) ×2 IMPLANT
WIRE AQUATRAK .035X150 ANG (WIRE) ×1 IMPLANT
WIRE COUGAR XT STRL 190CM (WIRE) ×1 IMPLANT
WIRE DOC EXTENSION .014X145CM (WIRE) ×1 IMPLANT
WIRE EMERALD 3MM-J .035X150CM (WIRE) ×1 IMPLANT
WIRE HI TORQ VERSACORE-J 145CM (WIRE) ×1 IMPLANT
WIRE VIPER ADVANCE COR .012TIP (WIRE) ×1 IMPLANT

## 2017-10-10 NOTE — Progress Notes (Addendum)
Patient states he can not afford the co pay, Marylene Land will contact MD.  # 1.  S/W JAMES @ [employer] # [id number]    1. EFFIENT  60 MG DAILY  COVER- YES  CO-PAY- $ 499.22  PRIOR APPROVAL- NO   2. PRASUGREL 60 MG DAILY  COVER-YES  CO-PAY- $ 309.76  PRIOR APPROVAL- NO   DEDUCTIBLE: THE PLAN DOESN'T SHOW A DEDUCTIBLE   PREFERRED PHARMACY : YES- [facility] OR [facility]   NO ALTERNATIVE MED

## 2017-10-10 NOTE — Progress Notes (Addendum)
Progress Note  [name] Date of Encounter: 10/10/2017  Primary Cardiologist: No primary care provider on file.   Subjective   occ SOB, Unable to pull sheath due to HTN    Inpatient Medications    Scheduled Meds: . allopurinol  100 mg Oral Daily  . amLODipine  10 mg Oral Daily  . aspirin  81 mg Oral Daily  . atorvastatin  40 mg Oral Daily  . guaiFENesin  1,200 mg Oral BID  . insulin aspart  0-20 Units Subcutaneous Q4H  . insulin aspart protamine- aspart  50 Units Subcutaneous BID WC  . isosorbide mononitrate  120 mg Oral Daily  . losartan  100 mg Oral Daily  . metoprolol succinate  100 mg Oral BID  . pantoprazole  40 mg Oral Daily  . sodium chloride flush  3 mL Intravenous Q12H  . sodium chloride flush  3 mL Intravenous Q12H   Continuous Infusions: . sodium chloride    . sodium chloride    . sodium chloride 1 mL/kg/hr (10/10/17 0636)  . heparin 1,650 Units/hr (10/10/17 0636)  . levofloxacin (LEVAQUIN) IV Stopped (10/08/17 1656)   PRN Meds: sodium chloride, sodium chloride, acetaminophen **OR** acetaminophen, ipratropium-albuterol, ondansetron **OR** ondansetron (ZOFRAN) IV, sodium chloride flush   Vital Signs    Vitals:   10/09/17 2142 10/10/17 0406 10/10/17 0538 10/10/17 0801  BP: 129/71 (!) 170/68 (!) 165/69 (!) 167/73  Pulse: 77 83  84  Resp:  11 12 11   Temp:  98.1 F (36.7 C)  (!) 97.3 F (36.3 C)  TempSrc:  Oral  Oral  SpO2:  92% 92% 94%  Weight:  194 lb 14.2 oz (88.4 kg)    Height:        Intake/Output Summary (Last 24 hours) at 10/10/2017 0848 Last data filed at 10/10/2017 0636 Gross per 24 hour  Intake 346.13 ml  Output 1275 ml  Net -928.87 ml   Filed Weights   10/08/17 0500 10/09/17 0500 10/10/17 0406  Weight: 195 lb 1.7 oz (88.5 kg) 195 lb 1.7 oz (88.5 kg) 194 lb 14.2 oz (88.4 kg)    Telemetry    SR - Personally Reviewed  ECG    SR with inf lat ST depression, some improvement post procedure.    - Personally  Reviewed  Physical Exam   GEN: No acute distress.  But anxious and worried about BP Neck: + JVD Cardiac: RRR, no murmurs, rubs, or gallops.  Respiratory:  Clear to diminished to auscultation bilaterally.  I do not hear rales, wheezes or rhonchi   GI: Soft, nontender, non-distended  MS: No edema; No deformity. Neuro:  Nonfocal  Psych: Normal affect   Labs    Chemistry Recent Labs  Lab 10/08/17 0450 10/09/17 0404 10/10/17 0651  NA 141 139 138  K 4.4 4.2 4.1  CL 105 106 107  CO2 23 23 20*  GLUCOSE 152* 147* 192*  BUN 40* 36* 18  CREATININE 0.98 0.72 0.68  CALCIUM 9.0 8.9 8.7*  GFRNONAA >60 >60 >60  GFRAA >60 >60 >60  ANIONGAP 13 10 11      Hematology Recent Labs  Lab 10/08/17 0450 10/09/17 0404 10/10/17 0651  WBC 18.6* 11.9* 10.9*  RBC 5.16 4.84 5.13  HGB 14.4 13.8 14.4  HCT 44.6 41.9 43.6  MCV 86.4 86.6 85.0  MCH 27.9 28.5 28.1  MCHC 32.3 32.9 33.0  RDW 14.0 14.1 14.0  PLT 229 210 197    Cardiac Enzymes Recent Labs  Lab  10/07/17 0607 10/07/17 1142 10/07/17 1726 10/08/17 0919  TROPONINI 0.20* 1.01* 1.02* 0.57*    Recent Labs  Lab 10/07/17 0349  TROPIPOC 0.01     BNP Recent Labs  Lab 10/07/17 0326  BNP 136.0*     DDimer  Recent Labs  Lab 10/07/17 0326  DDIMER 0.50    Lipid Panel     Component Value Date/Time   CHOL 132 05/30/2010 0938   TRIG 790 (H) 05/30/2010 0938   HDL 24 (L) 05/30/2010 0938   CHOLHDL 5.5 Ratio 05/30/2010 0938   VLDL NOT CALC mg/dL 16/03/9603 5409   LDLCALC See Comment mg/dL 81/19/1478 2956    Radiology    No results found.  Cardiac Studies   Echo 10/07/17  Study Conclusions  - Left ventricle: The cavity size was normal. Wall thickness was   increased in a pattern of mild LVH. The estimated ejection   fraction was 45%. There is hypokinesis of the basal-midinferior   and inferoseptal myocardium. Doppler parameters are consistent   with abnormal left ventricular relaxation (grade 1 diastolic    dysfunction). - Aortic valve: Poorly visualized. Moderately calcified annulus.   Mildly calcified leaflets. - Mitral valve: Mildly calcified annulus. There was trivial   regurgitation. - Right atrium: Central venous pressure (est): 3 mm Hg. - Atrial septum: No defect or patent foramen ovale was identified. - Tricuspid valve: There was trivial regurgitation. - Pulmonary arteries: Systolic pressure could not be accurately   estimated. - Pericardium, extracardiac: There was no pericardial effusion.  Cardiac cath yesterday: Conclusion     Prox LAD lesion is 75% stenosed. Heavily calcified, concentric  Mid LAD stent is 70% stenosed.  LV end diastolic pressure is normal.  Ost 1st Diag lesion is 50% stenosed.  Prox Cx lesion is 40% stenosed.  Prox RCA lesion is 70% stenosed.Mid RCA to Dist RCA lesion is 100% stenosed.   Severe two-vessel disease with 100% occlusion of the RCA as previously known, severe in-stent restenosis of the mid LAD with severe concentric calcification of the proximal to mid LAD. Known anomalous circumflex with moderate disease.  Recommendation:   Plan staged atherectomy base PCI of proximal LAD lesion followed by balloon expansion of the mid LAD stent (scheduled for tomorrow)  Restart IV heparin 8 hours after sheath removal  Continue aggressive management of heart failure and CAD risk factors    PCI  10/10/17   FINDINGS: 1. Severe proximal LAD calcified disease (70-80%) and mid LAD ISR (70%). 2. Successful orbital atherectomy and DES placement to proximal LAD using Promus Premier 3.0 x 20 mm stent (post-dilated to 3.6 mm). 3. Successful PCI to mid LAD ISR using Promus Premier 2.5 x 16 mm (post-dilated to 2.8 mm).  RECOMMENDATIONS: 1. DAPT with aspirin and prasugrel for at least 12 months. 2. Aggressive secondary prevention. 3. Remove RFA sheath when ACT < 170 seconds.    Patient Profile     72 y.o. 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  He is admitted with acute hypoxic respiratory failure in the setting of pulmonary edema.  Possible pneumonia noted by chest x-ray but currently afebrile on antibiotics.  Troponin I peaked at 1.02 consistent with NSTEMI.  Follow-up echocardiogram shows LVEF approximately 45% with mid to basal inferior/inferoseptal hypokinesis.    Assessment & Plan    NSTEMI pk troponin of 1.02  (transferred from [facility]) --no chest pain but some SOB now after procedure and with elevated BP  Acute pulmonary edema and hypoxic respiratory failure on arrival at [facility] 10/07/17  --neg 5401 since admit though wt has not changed much  --now with JVD, may need IV lasix for one dose now post intervention will give lasix  (last dose given on the 23rd) reviewed wit Dr. Okey Dupre.   S/p stent to mLAD with DES promus premier Successful orbital atherectomy and DES placement to proximal LAD using Promus Premier --DPAT for 12 months at least.  On asa and effient  Also on imdur.   CAD with prior PCI to RCA and LAD, though now known RCA occlusion with Lt to Rt collaterals.      HLD-mixed  on Lipitor 40 mg  Dr. Lysbeth Galas follows statins.  Hx of elevated TG in past.  Will check in AM   HTN  On metoprolol XL 100 BID, HCTZ 12.5 daily,  Amlodipine 10 mg daily  Elevated post cath requiring IV hydralazine, and labetalol.  Losartan 100 mg daily.   Hx PAD with mild renal artery stenosis   DM-2 on SSI and 70/30.  Glucose stable.    For questions or updates, please contact [facility] Please consult [email or web address] for contact info under  Cardiology/STEMI.      Signed, Nada Boozer, NP  10/10/2017, 8:48 AM     Patient seen and examined. Agree with assessment and plan. C/o mild sob; no recurrent chest pain. HR 105; BP 155/88. Will give lasix 20 mg iv x 1; Cr 0.68. Reviewed cath/ PCI data   Lennette Bihari, MD, [facility] 10/10/2017 5:02 PM

## 2017-10-10 NOTE — Progress Notes (Signed)
Patient Demographics:    [name] [name], is a 72 y.o. male, DOB - [birth year], FHQ:[id number]  Admit date - 10/07/2017   Admitting Physician [name], MD  Outpatient Primary MD for the patient is [name], MD  LOS - 3   Chief Complaint  Patient presents with  . Respiratory Distress        Subjective:    [name] [name] today has no fevers, no emesis,  No chest pain, family at bedside, questions answered  Assessment  & Plan :    Principal Problem:   Acute hypoxemic respiratory failure (HCC) Active Problems:   Type 2 diabetes mellitus without complication (HCC)   Mixed hyperlipidemia   Coronary atherosclerosis   PVD   Non-ST elevation (NSTEMI) myocardial infarction [facility])   Essential hypertension   Dyslipidemia   Pulmonary edema   Coronary artery disease involving native coronary artery of native heart with angina pectoris (HCC)   NSTEMI (non-ST elevated myocardial infarction) (HCC)   Flash pulmonary edema (HCC)  LHC on 10/09/17  And PCI on 10/10/17 --FINDINGS: 1. Severe proximal LAD calcified disease (70-80%) and mid LAD ISR (70%). 2. Successful orbital atherectomy and DES placement to proximal LAD using Promus Premier 3.0 x 20 mm stent (post-dilated to 3.6 mm). 3. Successful PCI to mid LAD ISR using Promus Premier 2.5 x 16 mm (post-dilated to 2.8 mm).  RECOMMENDATIONS: 1. DAPT with aspirin and prasugrel for at least 12 months. 2. Aggressive secondary prevention.    1)CAD/NSTEMI-  S/p PCI--- DES placement to proximal LAD and PCI to mid LAD ISR , continue Lipitor 40 mg daily, Effient and aspirin, continue metoprolol 100 mg twice daily, isosorbide 120 mg daily, heparin drip has been discontinued per cardiology service  2)DM-we will, watch for hypoglycemia given recent n.p.o. status, ,continue 70/30 insulin 50 units twice daily along with sliding scale coverage  3)HTN-stable  BP, okay to restart amlodipine 10 g daily, Toprol as above, isosorbide as above  4)CAP-continue mucolytics and Levaquin for community-acquired pneumonia, clinically improving  Code Status : Full   Disposition Plan  : home   Consults  :  card   DVT Prophylaxis  :  Lovenox    Lab Results  Component Value Date   PLT 197 10/10/2017    Inpatient Medications  Scheduled Meds: . allopurinol  100 mg Oral Daily  . amLODipine  10 mg Oral Daily  . aspirin  81 mg Oral Daily  . atorvastatin  40 mg Oral Daily  . [START ON 10/11/2017] enoxaparin (LOVENOX) injection  40 mg Subcutaneous Q24H  . guaiFENesin  1,200 mg Oral BID  . insulin aspart  0-20 Units Subcutaneous Q4H  . insulin aspart protamine- aspart  50 Units Subcutaneous BID WC  . isosorbide mononitrate  120 mg Oral Daily  . losartan  100 mg Oral Daily  . metoprolol succinate  100 mg Oral BID  . pantoprazole  40 mg Oral Daily  . [START ON 10/11/2017] prasugrel  10 mg Oral Daily  . sodium chloride flush  3 mL Intravenous Q12H  . sodium chloride flush  3 mL Intravenous Q12H   Continuous Infusions: . sodium chloride    . sodium chloride    . levofloxacin (LEVAQUIN) IV 750 mg (10/10/17 1328)  PRN Meds:.sodium chloride, sodium chloride, acetaminophen **OR** acetaminophen, ALPRAZolam, ipratropium-albuterol, morphine injection, ondansetron **OR** ondansetron (ZOFRAN) IV, sodium chloride flush    Anti-infectives (From admission, onward)   Start     Dose/Rate Route Frequency Ordered Stop   10/08/17 1300  levofloxacin (LEVAQUIN) IVPB 750 mg     750 mg 100 mL/hr over 90 Minutes Intravenous Every 24 hours 10/08/17 1202     10/07/17 1000  levofloxacin (LEVAQUIN) IVPB 750 mg     750 mg 100 mL/hr over 90 Minutes Intravenous  Once 10/07/17 0952 10/07/17 1336   10/07/17 0700  levofloxacin (LEVAQUIN) IVPB 750 mg  Status:  Discontinued     750 mg 100 mL/hr over 90 Minutes Intravenous  Once 10/07/17 0657 10/07/17 0951        Objective:     Vitals:   10/10/17 1058 10/10/17 1103 10/10/17 1148 10/10/17 1633  BP: 127/89 133/81 (!) 173/81 107/74  Pulse: 85 76 83 (!) 103  Resp: 13 15 10 11   Temp:   (!) 97.5 F (36.4 C) (!) 97.5 F (36.4 C)  TempSrc:   Oral Oral  SpO2: 96% 95% 96% 100%  Weight:      Height:        Wt Readings from Last 3 Encounters:  10/10/17 88.4 kg (194 lb 14.2 oz)  04/01/17 89.9 kg (198 lb 3.2 oz)  09/07/15 90.3 kg (199 lb)     Intake/Output Summary (Last 24 hours) at 10/10/2017 1855 Last data filed at 10/10/2017 1526 Gross per 24 hour  Intake 346.13 ml  Output 1425 ml  Net -1078.87 ml     Physical Exam  Gen:- Awake Alert,  In no apparent distress  HEENT:- [place].AT, No sclera icterus Neck-Supple Neck,No JVD,.  Lungs-  CTAB , good air movement CV- S1, S2 normal Abd-  +ve B.Sounds, Abd Soft, No tenderness,    Extremity/Skin:- No  edema,   good pulses, right groin without significant hematoma Psych-affect is appropriate, oriented x3 Neuro-no new focal deficits, no tremors   Data Review:   Micro Results Recent Results (from the past 240 hour(s))  MRSA PCR Screening     Status: None   Collection Time: 10/07/17  8:52 AM  Result Value Ref Range Status   MRSA by PCR NEGATIVE NEGATIVE Final    Comment:        The GeneXpert MRSA Assay (FDA approved for NASAL specimens only), is one component of a comprehensive MRSA colonization surveillance program. It is not intended to diagnose MRSA infection nor to guide or monitor treatment for MRSA infections. Performed at [facility], [address] [place], [place] [address]     Radiology Reports Ct Angio Chest Pe W And/or Wo Contrast  Result Date: 10/07/2017 CLINICAL DATA:  Acute onset of shortness of breath with exertion. Elevated D-dimer. EXAM: CT ANGIOGRAPHY CHEST WITH CONTRAST TECHNIQUE: Multidetector CT imaging of the chest was performed using the standard protocol during bolus administration of intravenous contrast. Multiplanar CT  image reconstructions and MIPs were obtained to evaluate the vascular anatomy. CONTRAST:  ISOVUE-370 IOPAMIDOL (ISOVUE-370) INJECTION 76% COMPARISON:  Chest radiograph performed earlier today at 3:55 a.m. FINDINGS: Cardiovascular:  There is no evidence of pulmonary embolus. The heart is normal in size. Diffuse coronary artery calcifications are seen. Scattered calcification is noted along the thoracic aorta and proximal great vessels. Mediastinum/Nodes: Visualized mediastinal nodes are borderline normal in size. No pericardial effusion is identified. The right thyroid lobe is absent. The left thyroid lobe is unremarkable in  appearance. No axillary lymphadenopathy is seen. Lungs/Pleura: Small bilateral pleural effusions are noted. Partial consolidation of both lower lobes is concerning for pneumonia. Mild additional airspace opacities are noted bilaterally. Underlying interstitial prominence raises concern for mild interstitial edema. Mild bilateral emphysema is noted. There is no evidence of pneumothorax.  No dominant mass is seen. Upper Abdomen: The visualized portions of the liver and spleen are unremarkable. Musculoskeletal: No acute osseous abnormalities are identified. The visualized musculature is unremarkable in appearance. Review of the MIP images confirms the above findings. IMPRESSION: 1. No evidence of pulmonary embolus. 2. Small bilateral pleural effusions. Partial consolidation of both lower lung lobes is concerning for pneumonia. Mild additional airspace opacities noted bilaterally. 3. Underlying interstitial prominence raises concern for mild interstitial edema. 4. Mild bilateral emphysema. 5. Diffuse coronary artery calcifications. Electronically Signed   By: Roanna Raider M.D.   On: 10/07/2017 06:51   Dg Chest Portable 1 View  Result Date: 10/07/2017 CLINICAL DATA:  Acute onset of shortness of breath. EXAM: PORTABLE CHEST 1 VIEW COMPARISON:  Chest radiograph performed 02/16/2014 FINDINGS:  The lungs are well-aerated. Vascular congestion is noted. Diffuse increased interstitial markings raise concern for pulmonary edema. There is no evidence of pleural effusion or pneumothorax. The cardiomediastinal silhouette is within normal limits. No acute osseous abnormalities are seen. IMPRESSION: Vascular congestion. Diffusely increased interstitial markings raise concern for pulmonary edema. Electronically Signed   By: Roanna Raider M.D.   On: 10/07/2017 04:17     CBC Recent Labs  Lab 10/07/17 0326 10/07/17 0352 10/08/17 0450 10/09/17 0404 10/10/17 0651  WBC 16.3*  --  18.6* 11.9* 10.9*  HGB 16.6 15.6 14.4 13.8 14.4  HCT 49.1 46.0 44.6 41.9 43.6  PLT 217  --  229 210 197  MCV 84.4  --  86.4 86.6 85.0  MCH 28.5  --  27.9 28.5 28.1  MCHC 33.8  --  32.3 32.9 33.0  RDW 13.6  --  14.0 14.1 14.0  LYMPHSABS 5.3*  --   --   --   --   MONOABS 0.6  --   --   --   --   EOSABS 0.2  --   --   --   --   BASOSABS 0.1  --   --   --   --     Chemistries  Recent Labs  Lab 10/07/17 0326 10/07/17 0352 10/08/17 0450 10/09/17 0404 10/10/17 0651  NA 141 140 141 139 138  K 3.2* 3.0* 4.4 4.2 4.1  CL 104 101 105 106 107  CO2 22  --  23 23 20*  GLUCOSE 410* 396* 152* 147* 192*  BUN 17 16 40* 36* 18  CREATININE 0.54* 0.50* 0.98 0.72 0.68  CALCIUM 9.0  --  9.0 8.9 8.7*  MG 1.4*  --   --   --   --    ------------------------------------------------------------------------------------------------------------------ No results for input(s): CHOL, HDL, LDLCALC, TRIG, CHOLHDL, LDLDIRECT in the last 72 hours.  Lab Results  Component Value Date   HGBA1C 9.9 (H) 05/30/2010   ------------------------------------------------------------------------------------------------------------------ No results for input(s): TSH, T4TOTAL, T3FREE, THYROIDAB in the last 72 hours.  Invalid input(s):  FREET3 ------------------------------------------------------------------------------------------------------------------ No results for input(s): VITAMINB12, FOLATE, FERRITIN, TIBC, IRON, RETICCTPCT in the last 72 hours.  Coagulation profile No results for input(s): INR, PROTIME in the last 168 hours.  No results for input(s): DDIMER in the last 72 hours.  Cardiac Enzymes Recent Labs  Lab 10/07/17 1142 10/07/17 1726 10/08/17 0919  TROPONINI  1.01* 1.02* 0.57*   ------------------------------------------------------------------------------------------------------------------    Component Value Date/Time   BNP 136.0 (H) 10/07/2017 1610     Shon Hale M.D on 10/10/2017 at 6:55 PM  Between 7am to 7pm - Pager - [phone]  After 7pm go to [email or web address] - password [email or web address]  [facility] -  Office  [phone]   Voice Recognition Reubin Milan dictation system was used to create this note, attempts have been made to correct errors. Please contact the author with questions and/or clarifications.

## 2017-10-10 NOTE — Interval H&P Note (Signed)
History and Physical Interval Note:  10/10/2017 8:36 AM  [name] [name]  has presented today for cardiac catheterization, with the diagnosis of NSTEMI. The various methods of treatment have been discussed with the patient and family. After consideration of risks, benefits and other options for treatment, the patient has consented to  Procedure(s): CORONARY ATHERECTOMY - CSI (N/A) as a surgical intervention .  The patient's history has been reviewed, patient examined, no change in status, stable for surgery.  I have reviewed the patient's chart and labs.  Questions were answered to the patient's satisfaction.    Cath Lab Visit (complete for each Cath Lab visit)  Clinical Evaluation Leading to the Procedure:   ACS: Yes.   (NSTEMI)  Non-ACS:  N/A  Antaniya Venuti

## 2017-10-10 NOTE — Brief Op Note (Signed)
BRIEF CARDIAC CATHETERIZATION NOTE  10/10/2017  11:20 AM  PATIENT:  [name] [name]  72 y.o. male  PRE-OPERATIVE DIAGNOSIS:  NSTEMI  POST-OPERATIVE DIAGNOSIS:  NSTEMI  PROCEDURE:  Procedure(s): CORONARY ATHERECTOMY - CSI (N/A) CORONARY STENT INTERVENTION (N/A)  SURGEON:  Surgeon(s) and Role:    * Parthena Fergeson, Cristal Deer, MD - Primary  FINDINGS: 1. Severe proximal LAD calcified disease (70-80%) and mid LAD ISR (70%). 2. Successful orbital atherectomy and DES placement to proximal LAD using Promus Premier 3.0 x 20 mm stent (post-dilated to 3.6 mm). 3. Successful PCI to mid LAD ISR using Promus Premier 2.5 x 16 mm (post-dilated to 2.8 mm).  RECOMMENDATIONS: 1. DAPT with aspirin and prasugrel for at least 12 months. 2. Aggressive secondary prevention. 3. Remove RFA sheath when ACT < 170 seconds.  Yvonne Kendall, MD [facility] Pager: [phone]

## 2017-10-10 NOTE — Progress Notes (Signed)
ANTICOAGULATION CONSULT NOTE -  Pharmacy Consult for HEPARIN Indication: chest pain/ACS  Allergies  Allergen Reactions  . Bee Venom Swelling  . Penicillins Itching   Patient Measurements: Height: 5\' 10"  (177.8 cm) Weight: 194 lb 14.2 oz (88.4 kg) IBW/kg (Calculated) : 73 HEPARIN DW (KG): 87.5  Vital Signs: Temp: 98.1 F (36.7 C) (04/25 0406) Temp Source: Oral (04/25 0406) BP: 165/69 (04/25 0538) Pulse Rate: 83 (04/25 0406)  Labs: Recent Labs    10/07/17 1142 10/07/17 1726  10/08/17 0450  10/08/17 0919 10/08/17 1837 10/09/17 0404 10/10/17 0651  HGB  --   --    < > 14.4  --   --   --  13.8 14.4  HCT  --   --   --  44.6  --   --   --  41.9 43.6  PLT  --   --   --  229  --   --   --  210 197  HEPARINUNFRC  --   --   --   --    < > <0.10* 0.12* 0.33 0.19*  CREATININE  --   --   --  0.98  --   --   --  0.72 0.68  TROPONINI 1.01* 1.02*  --   --   --  0.57*  --   --   --    < > = values in this interval not displayed.   Estimated Creatinine Clearance: 97.6 mL/min (by C-G formula based on SCr of 0.68 mg/dL).  Medical History: Past Medical History:  Diagnosis Date  . Coronary artery disease    a. Cath: 2000 nonobs dz. b. 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 s/p BMS to LAD & RCA ; s/p PCI/DES dRCA & PDA x2 2011. d. cath 02/17/14: LM patent, LAD calcified, diffuse irregs, no high grade stenosis mLAD stent patent, LCx, diffuse irregs no high-grade dz, RCA total occ w/ L to R collats, medical Rx rec  . Diabetes mellitus   . Diastolic dysfunction    a. echo 02/18/14: EF 45-50%, mild LVH, AK of inferior and inferoseptal myocardium, GR1DD  . Hiatal hernia   . Hyperlipidemia   . PVD (peripheral vascular disease) (HCC)    ABIs of 0.69 on right and 0.93 on left  . Renal artery stenosis (HCC)    Mild, bilateral  . Sleep apnea    Mild   Medications:  Medications Prior to Admission  Medication Sig Dispense Refill Last Dose  . allopurinol (ZYLOPRIM)  100 MG tablet Take 100 mg by mouth daily.     10/06/2017 at Unknown time  . amLODipine (NORVASC) 10 MG tablet Take 1 tablet (10 mg total) by mouth daily. 90 tablet 3 10/06/2017 at Unknown time  . aspirin 81 MG tablet Take 81 mg by mouth daily.     10/06/2017 at Unknown time  . fenofibrate (TRICOR) 145 MG tablet TAKE 1 TABLET BY MOUTH EVERY DAY 30 tablet 11 10/06/2017 at Unknown time  . Fish Oil OIL Take 1 capsule by mouth daily.     Past Month at Unknown time  . hydrochlorothiazide (MICROZIDE) 12.5 MG capsule Take 12.5 mg by mouth daily.   10/06/2017 at Unknown time  . insulin NPH-regular Human (NOVOLIN 70/30) (70-30) 100 UNIT/ML injection Inject 48 Units into the skin 2 (two) times daily.   10/06/2017 at Unknown time  . isosorbide mononitrate (IMDUR) 120 MG 24 hr tablet Take 1 tablet (120  mg total) by mouth daily. 90 tablet 3 10/06/2017 at Unknown time  . losartan (COZAAR) 100 MG tablet Take 100 mg by mouth daily.   10/06/2017 at Unknown time  . metFORMIN (GLUCOPHAGE) 500 MG tablet Take 1,000 mg by mouth 2 (two) times daily.   10/06/2017 at Unknown time  . metoprolol (TOPROL-XL) 200 MG 24 hr tablet Take 0.5 tablets (100 mg total) by mouth 2 (two) times daily. 90 tablet 3 10/06/2017 at Unknown time  . metoprolol tartrate (LOPRESSOR) 100 MG tablet TAKE 1 TABLET BY MOUTH TWICE A DAY 60 tablet 5 10/06/2017 at 0300  . omeprazole (PRILOSEC) 20 MG capsule Take 20 mg by mouth daily.   10/06/2017 at Unknown time  . atorvastatin (LIPITOR) 40 MG tablet Take 40 mg by mouth daily.   More than a month at Unknown time  . nitroGLYCERIN (NITROSTAT) 0.4 MG SL tablet Place 1 tablet (0.4 mg total) under the tongue every 5 (five) minutes as needed for chest pain. 25 tablet 11 More than a month at Unknown time   Assessment: 72yo male CAD status post previous PCI to the RCA and LAD with most recent cardiac catheterization in 2015 showing occlusion of the mid RCA associated with left to right collaterals, patent mid LAD stent with  nonobstructive LAD/diagonal stenosis, and mild atherosclerosis.  Pharmacy consulted to restart Heparin 8 hours after sheath removal on 4/24. Previously therapeutic with heparin level of 0.33 on 1650 units/hr.  Heparin level this morning is subtherapeutic at 0.19 after just under 6 hours. CBC stable and wnl.   Goal of Therapy:  Heparin level 0.3-0.7 units/ml Monitor platelets by anticoagulation protocol: Yes   Plan:  Increase Heparin infusion to 1800 units/hr Check anti-Xa level in 6 hours and daily while on heparin Continue to monitor H&H and platelets   Thank you for allowing [place] to participate in this patients care.  Signe Colt, PharmD Clinical phone for 10/10/2017 from 7a-3:30p: [phone] If after 3:30p, please call main pharmacy at: [phone] 10/10/2017 7:47 AM

## 2017-10-10 NOTE — Progress Notes (Signed)
Site area: right groin  Site Prior to Removal:  Level 1  Pressure Applied For 20 MINUTES    Minutes Beginning at 1600   Manual:   Yes.    Patient Status During Pull:  stable  Post Pull Groin Site:  Level 0  Post Pull Instructions Given:  Yes.    Post Pull Pulses Present:  Yes.    Dressing Applied:  Yes.    Comments:

## 2017-10-11 ENCOUNTER — Encounter (HOSPITAL_COMMUNITY): Payer: Self-pay | Admitting: Internal Medicine

## 2017-10-11 DIAGNOSIS — J189 Pneumonia, unspecified organism: Secondary | ICD-10-CM

## 2017-10-11 DIAGNOSIS — Z95820 Peripheral vascular angioplasty status with implants and grafts: Secondary | ICD-10-CM

## 2017-10-11 HISTORY — DX: Pneumonia, unspecified organism: J18.9

## 2017-10-11 HISTORY — DX: Peripheral vascular angioplasty status with implants and grafts: Z95.820

## 2017-10-11 LAB — GLUCOSE, CAPILLARY
GLUCOSE-CAPILLARY: 181 mg/dL — AB (ref 65–99)
Glucose-Capillary: 182 mg/dL — ABNORMAL HIGH (ref 65–99)
Glucose-Capillary: 187 mg/dL — ABNORMAL HIGH (ref 65–99)
Glucose-Capillary: 141 mg/dL — ABNORMAL HIGH (ref 65–99)

## 2017-10-11 LAB — CBC
HCT: 48.8 % (ref 39.0–52.0)
HEMOGLOBIN: 16.7 g/dL (ref 13.0–17.0)
MCH: 28.7 pg (ref 26.0–34.0)
MCHC: 34.2 g/dL (ref 30.0–36.0)
MCV: 84 fL (ref 78.0–100.0)
PLATELETS: 203 10*3/uL (ref 150–400)
RBC: 5.81 MIL/uL (ref 4.22–5.81)
RDW: 13.8 % (ref 11.5–15.5)
WBC: 13.7 10*3/uL — AB (ref 4.0–10.5)

## 2017-10-11 LAB — BASIC METABOLIC PANEL
ANION GAP: 14 (ref 5–15)
BUN: 15 mg/dL (ref 6–20)
CHLORIDE: 104 mmol/L (ref 101–111)
CO2: 19 mmol/L — ABNORMAL LOW (ref 22–32)
Calcium: 9.1 mg/dL (ref 8.9–10.3)
Creatinine, Ser: 0.79 mg/dL (ref 0.61–1.24)
GFR calc Af Amer: >60 mL/min (ref >60)
GLUCOSE: 184 mg/dL — AB (ref 65–99)
POTASSIUM: 3.9 mmol/L (ref 3.5–5.1)
Sodium: 137 mmol/L (ref 135–145)

## 2017-10-11 LAB — BRAIN NATRIURETIC PEPTIDE: B NATRIURETIC PEPTIDE 5: 312.8 pg/mL — AB (ref 0.0–100.0)

## 2017-10-11 MED ORDER — ATORVASTATIN CALCIUM 40 MG PO TABS: 40.0000 mg | ORAL_TABLET | Freq: Every day | ORAL | 6 refills | Status: DC

## 2017-10-11 MED ORDER — TICAGRELOR 90 MG PO TABS: 90.0000 mg | ORAL_TABLET | Freq: Two times a day (BID) | ORAL | Status: DC

## 2017-10-11 MED ORDER — LEVOFLOXACIN 500 MG PO TABS: 500.0000 mg | ORAL_TABLET | Freq: Every day | ORAL | Status: DC

## 2017-10-11 MED ORDER — TICAGRELOR 90 MG PO TABS: 90.0000 mg | ORAL_TABLET | Freq: Two times a day (BID) | ORAL | 0 refills | Status: DC

## 2017-10-11 MED ORDER — LEVOFLOXACIN 500 MG PO TABS: 500.0000 mg | ORAL_TABLET | Freq: Every day | ORAL | 0 refills | Status: DC

## 2017-10-11 MED ORDER — LEVOFLOXACIN 500 MG PO TABS
500.0000 mg | ORAL_TABLET | Freq: Every day | ORAL | Status: DC
  Administered 2017-10-11: 12:00:00 500 mg via ORAL
  Filled 2017-10-11: qty 1

## 2017-10-11 MED ORDER — TICAGRELOR 90 MG PO TABS: 90.0000 mg | ORAL_TABLET | Freq: Two times a day (BID) | ORAL | 4 refills | Status: DC

## 2017-10-11 MED ORDER — ACETAMINOPHEN 325 MG PO TABS: 650.0000 mg | ORAL_TABLET | Freq: Four times a day (QID) | ORAL | Status: DC | PRN

## 2017-10-11 MED ORDER — FENOFIBRATE 145 MG PO TABS: 145.0000 mg | ORAL_TABLET | Freq: Every day | ORAL | 11 refills | Status: DC

## 2017-10-11 MED ORDER — TICAGRELOR 90 MG PO TABS
180.0000 mg | ORAL_TABLET | Freq: Once | ORAL | Status: AC
  Administered 2017-10-11: 10:00:00 180 mg via ORAL
  Filled 2017-10-11: qty 2

## 2017-10-11 MED FILL — Heparin Sod (Porcine)-NaCl IV Soln 1000 Unit/500ML-0.9%: INTRAVENOUS | Qty: 1000 | Status: AC

## 2017-10-11 MED FILL — Nitroglycerin IV Soln 100 MCG/ML in D5W: INTRA_ARTERIAL | Qty: 10 | Status: AC

## 2017-10-11 NOTE — Discharge Instructions (Signed)
Call [facility] at [phone] if any bleeding, swelling or drainage at cath site.  May shower, no tub baths for 48 hours for groin sticks. No lifting over 5 pounds for 5 days.  No Driving for 3 days.    Do Not begin Metformin until the evening of 10/12/17 may interact with cath dye.   Heart Healthy diabetic diet.    Do not stop Brilinta, this with asprin is to keep stent open.     Avoid ibuprofen/Advil/Aleve/Motrin/Goody Powders/Naproxen/BC powders/Meloxicam/Diclofenac/Indomethacin and other Nonsteroidal anti-inflammatory medications as these will make you more likely to bleed and can cause stomach ulcers, can also cause Kidney problems.

## 2017-10-11 NOTE — Care Management Important Message (Signed)
Important Message  Patient Details  Name: [name] [name] MRN: [id number] Date of Birth: 04/08/1946   Medicare Important Message Given:  Yes    Oralia Rud Cayson Kalb 10/11/2017, 12:58 PM

## 2017-10-11 NOTE — Progress Notes (Signed)
Patient Demographics:    [name] [name], is a 72 y.o. male, DOB - [birth year], ZOX:[id number]  Admit date - 10/07/2017   Admitting Physician [name], MD  Outpatient Primary MD for the patient is [name], MD  LOS - 4   Chief Complaint  Patient presents with  . Respiratory Distress        Subjective:    [name] [name] today has no fevers, no emesis,  No chest pain, family at bedside, requesting to have a shower,  questions answered  Assessment  & Plan :    Principal Problem:   Acute hypoxemic respiratory failure (HCC) Active Problems:   Type 2 diabetes mellitus without complication (HCC)   Mixed hyperlipidemia   Coronary atherosclerosis   PVD   Non-ST elevation (NSTEMI) myocardial infarction [facility])   Essential hypertension   Dyslipidemia   Pulmonary edema   Coronary artery disease involving native coronary artery of native heart with angina pectoris (HCC)   Flash pulmonary edema (HCC)   S/P angioplasty with stent, atherectomy DES to pLAD and DES to mLAD 10/10/17    CAP (community acquired pneumonia)  LHC on 10/09/17  And PCI on 10/10/17 --FINDINGS: 1. Severe proximal LAD calcified disease (70-80%) and mid LAD ISR (70%). 2. Successful orbital atherectomy and DES placement to proximal LAD using Promus Premier 3.0 x 20 mm stent (post-dilated to 3.6 mm). 3. Successful PCI to mid LAD ISR using Promus Premier 2.5 x 16 mm (post-dilated to 2.8 mm).  RECOMMENDATIONS: 1. DAPT with aspirin and Brillantal for at least 12 months. 2. Aggressive secondary prevention.    1)CAD/NSTEMI-  S/p PCI on 10/10/17--- DES placement to proximal LAD and PCI to mid LAD ISR , continue Lipitor 40 mg daily, Brilanta and aspirin, continue metoprolol 100 mg twice daily, isosorbide 120 mg daily, patient apparently is a Plavix nonresponder, and Effient is too expensive for him.  Cardiology recommends outpatient  cardiac rehab in [place], [place], avoid NSAIDs while on DAPT  2)DM- continue home insulin regimen, hold metformin due to contrast study  3)HTN-stable BP,  C/n  amlodipine 10 g daily, Toprol as above, isosorbide as above  4)CAP-continue mucolytics and Levaquin for community-acquired pneumonia, clinically improving  Code Status : Full   Disposition Plan  : home   Consults  :  cardiology   DVT Prophylaxis  :  Lovenox    Lab Results  Component Value Date   PLT 203 10/11/2017    Inpatient Medications  Scheduled Meds: . allopurinol  100 mg Oral Daily  . amLODipine  10 mg Oral Daily  . aspirin  81 mg Oral Daily  . atorvastatin  40 mg Oral Daily  . enoxaparin (LOVENOX) injection  40 mg Subcutaneous Q24H  . guaiFENesin  1,200 mg Oral BID  . insulin aspart  0-20 Units Subcutaneous TID WC  . insulin aspart protamine- aspart  50 Units Subcutaneous BID WC  . isosorbide mononitrate  120 mg Oral Daily  . levofloxacin  500 mg Oral Daily  . losartan  100 mg Oral Daily  . metoprolol succinate  100 mg Oral BID  . pantoprazole  40 mg Oral Daily  . sodium chloride flush  3 mL Intravenous Q12H  . sodium chloride flush  3 mL  Intravenous Q12H  . [START ON 10/12/2017] ticagrelor  90 mg Oral BID   Continuous Infusions: . sodium chloride    . sodium chloride     PRN Meds:.sodium chloride, sodium chloride, acetaminophen **OR** acetaminophen, ALPRAZolam, ipratropium-albuterol, morphine injection, ondansetron **OR** ondansetron (ZOFRAN) IV, sodium chloride flush    Anti-infectives (From admission, onward)   Start     Dose/Rate Route Frequency Ordered Stop   10/12/17 1000  levofloxacin (LEVAQUIN) tablet 500 mg  Status:  Discontinued    Note to Pharmacy:  To complete treatment started in hospital   500 mg Oral Daily 10/11/17 1012 10/11/17 1036   10/12/17 0000  levofloxacin (LEVAQUIN) 500 MG tablet  Status:  Discontinued     500 mg Oral Daily 10/11/17 1014 10/11/17    10/12/17 0000   levofloxacin (LEVAQUIN) 500 MG tablet     500 mg Oral Daily 10/11/17 1045     10/11/17 1230  levofloxacin (LEVAQUIN) tablet 500 mg    Note to Pharmacy:  To complete treatment started in hospital   500 mg Oral Daily 10/11/17 1036     10/08/17 1300  levofloxacin (LEVAQUIN) IVPB 750 mg  Status:  Discontinued     750 mg 100 mL/hr over 90 Minutes Intravenous Every 24 hours 10/08/17 1202 10/11/17 1036   10/07/17 1000  levofloxacin (LEVAQUIN) IVPB 750 mg     750 mg 100 mL/hr over 90 Minutes Intravenous  Once 10/07/17 0952 10/07/17 1336   10/07/17 0700  levofloxacin (LEVAQUIN) IVPB 750 mg  Status:  Discontinued     750 mg 100 mL/hr over 90 Minutes Intravenous  Once 10/07/17 0657 10/07/17 0951        Objective:   Vitals:   10/10/17 2000 10/10/17 2136 10/11/17 0303 10/11/17 0741  BP: (!) 143/82 (!) 161/89 (!) 142/79 133/77  Pulse:  (!) 107 91 97  Resp: 11  15   Temp:   (!) 97.4 F (36.3 C) 98 F (36.7 C)  TempSrc:   Oral Oral  SpO2: 97%  94%   Weight:   83.8 kg (184 lb 11.9 oz)   Height:        Wt Readings from Last 3 Encounters:  10/11/17 83.8 kg (184 lb 11.9 oz)  04/01/17 89.9 kg (198 lb 3.2 oz)  09/07/15 90.3 kg (199 lb)     Intake/Output Summary (Last 24 hours) at 10/11/2017 1221 Last data filed at 10/10/2017 2200 Gross per 24 hour  Intake 355 ml  Output 425 ml  Net -70 ml     Physical Exam  Gen:- Awake Alert,  In no apparent distress  HEENT:- [place].AT, No sclera icterus Neck-Supple Neck,No JVD,.  Lungs-  CTAB , good air movement CV- S1, S2 normal Abd-  +ve B.Sounds, Abd Soft, No tenderness,    Extremity/Skin:- No  edema,   good pulses, right groin without significant hematoma Psych-affect is appropriate, oriented x3 Neuro-no new focal deficits, no tremors   Data Review:   Micro Results Recent Results (from the past 240 hour(s))  MRSA PCR Screening     Status: None   Collection Time: 10/07/17  8:52 AM  Result Value Ref Range Status   MRSA by PCR NEGATIVE  NEGATIVE Final    Comment:        The GeneXpert MRSA Assay (FDA approved for NASAL specimens only), is one component of a comprehensive MRSA colonization surveillance program. It is not intended to diagnose MRSA infection nor to guide or monitor treatment for MRSA infections. Performed at  [facility] [address] [place], [place] [address]     Radiology Reports Ct Angio Chest Pe W And/or Wo Contrast  Result Date: 10/07/2017 CLINICAL DATA:  Acute onset of shortness of breath with exertion. Elevated D-dimer. EXAM: CT ANGIOGRAPHY CHEST WITH CONTRAST TECHNIQUE: Multidetector CT imaging of the chest was performed using the standard protocol during bolus administration of intravenous contrast. Multiplanar CT image reconstructions and MIPs were obtained to evaluate the vascular anatomy. CONTRAST:  ISOVUE-370 IOPAMIDOL (ISOVUE-370) INJECTION 76% COMPARISON:  Chest radiograph performed earlier today at 3:55 a.m. FINDINGS: Cardiovascular:  There is no evidence of pulmonary embolus. The heart is normal in size. Diffuse coronary artery calcifications are seen. Scattered calcification is noted along the thoracic aorta and proximal great vessels. Mediastinum/Nodes: Visualized mediastinal nodes are borderline normal in size. No pericardial effusion is identified. The right thyroid lobe is absent. The left thyroid lobe is unremarkable in appearance. No axillary lymphadenopathy is seen. Lungs/Pleura: Small bilateral pleural effusions are noted. Partial consolidation of both lower lobes is concerning for pneumonia. Mild additional airspace opacities are noted bilaterally. Underlying interstitial prominence raises concern for mild interstitial edema. Mild bilateral emphysema is noted. There is no evidence of pneumothorax.  No dominant mass is seen. Upper Abdomen: The visualized portions of the liver and spleen are unremarkable. Musculoskeletal: No acute osseous abnormalities are identified. The visualized  musculature is unremarkable in appearance. Review of the MIP images confirms the above findings. IMPRESSION: 1. No evidence of pulmonary embolus. 2. Small bilateral pleural effusions. Partial consolidation of both lower lung lobes is concerning for pneumonia. Mild additional airspace opacities noted bilaterally. 3. Underlying interstitial prominence raises concern for mild interstitial edema. 4. Mild bilateral emphysema. 5. Diffuse coronary artery calcifications. Electronically Signed   By: Roanna Raider M.D.   On: 10/07/2017 06:51   Dg Chest Portable 1 View  Result Date: 10/07/2017 CLINICAL DATA:  Acute onset of shortness of breath. EXAM: PORTABLE CHEST 1 VIEW COMPARISON:  Chest radiograph performed 02/16/2014 FINDINGS: The lungs are well-aerated. Vascular congestion is noted. Diffuse increased interstitial markings raise concern for pulmonary edema. There is no evidence of pleural effusion or pneumothorax. The cardiomediastinal silhouette is within normal limits. No acute osseous abnormalities are seen. IMPRESSION: Vascular congestion. Diffusely increased interstitial markings raise concern for pulmonary edema. Electronically Signed   By: Roanna Raider M.D.   On: 10/07/2017 04:17     CBC Recent Labs  Lab 10/07/17 0326 10/07/17 0352 10/08/17 0450 10/09/17 0404 10/10/17 0651 10/11/17 0258  WBC 16.3*  --  18.6* 11.9* 10.9* 13.7*  HGB 16.6 15.6 14.4 13.8 14.4 16.7  HCT 49.1 46.0 44.6 41.9 43.6 48.8  PLT 217  --  229 210 197 203  MCV 84.4  --  86.4 86.6 85.0 84.0  MCH 28.5  --  27.9 28.5 28.1 28.7  MCHC 33.8  --  32.3 32.9 33.0 34.2  RDW 13.6  --  14.0 14.1 14.0 13.8  LYMPHSABS 5.3*  --   --   --   --   --   MONOABS 0.6  --   --   --   --   --   EOSABS 0.2  --   --   --   --   --   BASOSABS 0.1  --   --   --   --   --     Chemistries  Recent Labs  Lab 10/07/17 0326 10/07/17 0352 10/08/17 0450 10/09/17 0404 10/10/17 0651 10/11/17 0258  NA  141 140 141 139 138 137  K 3.2* 3.0* 4.4  4.2 4.1 3.9  CL 104 101 105 106 107 104  CO2 22  --  23 23 20* 19*  GLUCOSE 410* 396* 152* 147* 192* 184*  BUN 17 16 40* 36* 18 15  CREATININE 0.54* 0.50* 0.98 0.72 0.68 0.79  CALCIUM 9.0  --  9.0 8.9 8.7* 9.1  MG 1.4*  --   --   --   --   --    ------------------------------------------------------------------------------------------------------------------ No results for input(s): CHOL, HDL, LDLCALC, TRIG, CHOLHDL, LDLDIRECT in the last 72 hours.  Lab Results  Component Value Date   HGBA1C 9.9 (H) 05/30/2010   ------------------------------------------------------------------------------------------------------------------ No results for input(s): TSH, T4TOTAL, T3FREE, THYROIDAB in the last 72 hours.  Invalid input(s): FREET3 ------------------------------------------------------------------------------------------------------------------ No results for input(s): VITAMINB12, FOLATE, FERRITIN, TIBC, IRON, RETICCTPCT in the last 72 hours.  Coagulation profile No results for input(s): INR, PROTIME in the last 168 hours.  No results for input(s): DDIMER in the last 72 hours.  Cardiac Enzymes Recent Labs  Lab 10/07/17 1142 10/07/17 1726 10/08/17 0919  TROPONINI 1.01* 1.02* 0.57*   ------------------------------------------------------------------------------------------------------------------    Component Value Date/Time   BNP 312.8 (H) 10/11/2017 7473     Shon Hale M.D on 10/11/2017 at 12:21 PM  Between 7am to 7pm - Pager - [phone]  After 7pm go to [email or web address] - password [email or web address]  [facility] -  Office  [phone]   Voice Recognition Reubin [place] dictation system was used to create this note, attempts have been made to correct errors. Please contact the author with questions and/or clarifications.

## 2017-10-11 NOTE — Progress Notes (Addendum)
Progress Note  [name] Date of Encounter: 10/11/2017  Primary Cardiologist: Rollene Rotunda, MD   Subjective   No chest pain and SOB is improved.   Inpatient Medications    Scheduled Meds: . allopurinol  100 mg Oral Daily  . amLODipine  10 mg Oral Daily  . aspirin  81 mg Oral Daily  . atorvastatin  40 mg Oral Daily  . enoxaparin (LOVENOX) injection  40 mg Subcutaneous Q24H  . guaiFENesin  1,200 mg Oral BID  . insulin aspart  0-20 Units Subcutaneous TID WC  . insulin aspart protamine- aspart  50 Units Subcutaneous BID WC  . isosorbide mononitrate  120 mg Oral Daily  . losartan  100 mg Oral Daily  . metoprolol succinate  100 mg Oral BID  . pantoprazole  40 mg Oral Daily  . prasugrel  10 mg Oral Daily  . sodium chloride flush  3 mL Intravenous Q12H  . sodium chloride flush  3 mL Intravenous Q12H   Continuous Infusions: . sodium chloride    . sodium chloride    . levofloxacin (LEVAQUIN) IV 750 mg (10/10/17 1328)   PRN Meds: sodium chloride, sodium chloride, acetaminophen **OR** acetaminophen, ALPRAZolam, ipratropium-albuterol, morphine injection, ondansetron **OR** ondansetron (ZOFRAN) IV, sodium chloride flush   Vital Signs    Vitals:   10/10/17 2000 10/10/17 2136 10/11/17 0303 10/11/17 0741  BP: (!) 143/82 (!) 161/89 (!) 142/79 133/77  Pulse:  (!) 107 91 97  Resp: 11  15   Temp:   (!) 97.4 F (36.3 C) 98 F (36.7 C)  TempSrc:   Oral Oral  SpO2: 97%  94%   Weight:   184 lb 11.9 oz (83.8 kg)   Height:        Intake/Output Summary (Last 24 hours) at 10/11/2017 0901 Last data filed at 10/10/2017 2200 Gross per 24 hour  Intake 355 ml  Output 625 ml  Net -270 ml   Filed Weights   10/09/17 0500 10/10/17 0406 10/11/17 0303  Weight: 195 lb 1.7 oz (88.5 kg) 194 lb 14.2 oz (88.4 kg) 184 lb 11.9 oz (83.8 kg)    Telemetry    SR one episode or rapid HR  - Personally Reviewed  ECG    SR with inf and ant lat ST depression similar to previous  EKGs. - Personally Reviewed  Physical Exam   GEN: No acute distress.   Neck: No JVD Cardiac: RRR, no murmurs, rubs, or gallops. Lt wrist and rt groin cath sites without hematoma Respiratory: Clear to auscultation bilaterally. GI: Soft, nontender, non-distended  MS: No edema; No deformity. Neuro:  Nonfocal  Psych: Normal affect   Labs    Chemistry Recent Labs  Lab 10/09/17 0404 10/10/17 0651 10/11/17 0258  NA 139 138 137  K 4.2 4.1 3.9  CL 106 107 104  CO2 23 20* 19*  GLUCOSE 147* 192* 184*  BUN 36* 18 15  CREATININE 0.72 0.68 0.79  CALCIUM 8.9 8.7* 9.1  GFRNONAA >60 >60 >60  GFRAA >60 >60 >60  ANIONGAP 10 11 14      Hematology Recent Labs  Lab 10/09/17 0404 10/10/17 0651 10/11/17 0258  WBC 11.9* 10.9* 13.7*  RBC 4.84 5.13 5.81  HGB 13.8 14.4 16.7  HCT 41.9 43.6 48.8  MCV 86.6 85.0 84.0  MCH 28.5 28.1 28.7  MCHC 32.9 33.0 34.2  RDW 14.1 14.0 13.8  PLT 210 197 203    Cardiac Enzymes Recent Labs  Lab 10/07/17 0607 10/07/17  1142 10/07/17 1726 10/08/17 0919  TROPONINI 0.20* 1.01* 1.02* 0.57*    Recent Labs  Lab 10/07/17 0349  TROPIPOC 0.01     BNP Recent Labs  Lab 10/07/17 0326 10/11/17 0258  BNP 136.0* 312.8*     DDimer  Recent Labs  Lab 10/07/17 0326  DDIMER 0.50     Radiology    No results found.  Cardiac Studies   Echo 10/07/17  Study Conclusions  - Left ventricle: The cavity size was normal. Wall thickness was increased in a pattern of mild LVH. The estimated ejection fraction was 45%. There is hypokinesis of the basal-midinferior and inferoseptal myocardium. Doppler parameters are consistent with abnormal left ventricular relaxation (grade 1 diastolic dysfunction). - Aortic valve: Poorly visualized. Moderately calcified annulus. Mildly calcified leaflets. - Mitral valve: Mildly calcified annulus. There was trivial regurgitation. - Right atrium: Central venous pressure (est): 3 mm Hg. - Atrial septum: No  defect or patent foramen ovale was identified. - Tricuspid valve: There was trivial regurgitation. - Pulmonary arteries: Systolic pressure could not be accurately estimated. - Pericardium, extracardiac: There was no pericardial effusion.  Cardiac cath yesterday: Conclusion     Prox LAD lesion is 75% stenosed. Heavily calcified, concentric  Mid LAD stent is 70% stenosed.  LV end diastolic pressure is normal.  Ost 1st Diag lesion is 50% stenosed.  Prox Cx lesion is 40% stenosed.  Prox RCA lesion is 70% stenosed.Mid RCA to Dist RCA lesion is 100% stenosed.  Severe two-vessel disease with 100% occlusion of the RCA as previously known, severe in-stent restenosis of the mid LAD with severe concentric calcification of the proximal to mid LAD. Known anomalous circumflex with moderate disease.  Recommendation:   Plan staged atherectomy base PCI of proximal LAD lesion followed by balloon expansion of the mid LAD stent (scheduled for tomorrow)  Restart IV heparin 8 hours after sheath removal  Continue aggressive management of heart failure and CAD risk factors    PCI  10/10/17   FINDINGS: 1. Severe proximal LAD calcified disease (70-80%) and mid LAD ISR (70%). 2. Successful orbital atherectomy and DES placement to proximal LAD using Promus Premier 3.0 x 20 mm stent (post-dilated to 3.6 mm). 3. Successful PCI to mid LAD ISR using Promus Premier 2.5 x 16 mm (post-dilated to 2.8 mm).  RECOMMENDATIONS: 1. DAPT with aspirin and prasugrel for at least 12 months. 2. Aggressive secondary prevention. 3. Remove RFA sheath when ACT < 170 seconds.   Patient Profile     72 y.o. 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 He is admitted with acute hypoxic respiratory failure in the setting of pulmonary edema. Possible pneumonia noted by chest x-ray but currently afebrile on antibiotics. Troponin I peaked at 1.02 consistent with NSTEMI.Follow-up echocardiogram shows LVEF approximately 45% with mid to basal inferior/inferoseptal hypokinesis   Assessment & Plan    NSTEMI pk troponin of 1.02  (transferred from [facility]) --no chest pain but some SOB now after procedure and with elevated BP --pt wants to go home today will need TOC appt [facility]  Acute pulmonary edema and hypoxic respiratory failure on arrival at [facility] 10/07/17  --  neg 5401 since admit though wt has not changed much  --yesterday with JVD, rec'd  IV lasix for one dose-20 mg --I&O not noted though on once section maybe 1000 ml neg.  Wt is doen 10 lbs, but different scales.  S/p stent to mLAD with DES promus premier Successful orbital atherectomy and DES placement to proximal LAD using Promus Premier --DPAT for 12 months at least.  On asa and effient  Also on imdur.   CAD with prior PCI to RCA and LAD, though now known RCA occlusion with Lt to Rt collaterals.      HLD-mixed  on Lipitor 40 mg  Dr. Lysbeth Galas follows statins.  Hx of elevated TG in past.  Will check in AM   HTN  On metoprolol XL 100 BID, HCTZ 12.5 daily,  Amlodipine 10 mg daily  Elevated post cath requiring IV hydralazine, and labetalol.  Losartan 100 mg daily.  --BP today 144 to 133/77.    Hx PAD with mild renal artery stenosis   DM-2 on SSI and 70/30.  Glucose stable. 181 to 187.  Will resume home dose at discharge.       For questions or updates, please contact [facility] Please consult [email or web address] for contact info under Cardiology/STEMI.      Signed, Nada Boozer, NP  10/11/2017, 9:01 AM     Patient seen and  examined. Agree with assessment and plan. No chest pain.  Pt is plavix unresponsive. Cannot afford effient.  Will give brilinta 180 mg now for loading and begin 90 mg bid tomorrow and try to get first 30 day card for brilinta.  For dc later today.  HR in the upper 90s despite Toprol 200 mg daily in 100 mg bid regimen. ? Consider changing amlodipine to cardizem as outpatient if increased HR continues.    Lennette Bihari, MD, [facility] 10/11/2017 9:26 AM

## 2017-10-11 NOTE — Progress Notes (Signed)
CARDIAC REHAB PHASE I   PRE:  Rate/Rhythm: 107 ST  BP:  Supine:   Sitting: 135/84  Standing:    SaO2: 94%RA  MODE:  Ambulation: 500 ft   POST:  Rate/Rhythm: 108 ST PVCs  BP:  Supine:   Sitting: 154/68  Standing:    SaO2: 94%RA 0900-1000 Pt walked 500 ft on RA with steady gait and no CP. Tolerated walk well. MI education completed with pt who voiced understanding. Stressed importance of antiplatelet with stent. Notified cardiology that pt cannot afford effient and nonresponder to plavix. Reviewed heart healthy food choices, ex ed, MI restrictions, CRP 2 and NTG use. Referring to [facility] Cardiac Rehab 2. Gave CHF booklet and reviewed importance of daily weights, 2000 mg sodium restriction and signs/symptoms. Cardiology to address antiplatelet.   Luetta Nutting, RN BSN  10/11/2017 9:56 AM

## 2017-10-11 NOTE — Progress Notes (Addendum)
#   1. S/W PARRSDIS  @ [employer] # [phone]   BRILINTA 90 MG BID  COVER- YES  CO-PAY- $ 407.53  PRIOR APPROVAL- NO  PLAN DOESN'T HAVE A DEDUCTIBLE   PREFERRED PHARMACY :YES - [facility] AND [facility]  NCM pageed Nada Boozer and informed her of this information, will give patient the 30 day free coupon and have him call the number on card for brilinta to see if he can get asssitance with refills.

## 2017-10-11 NOTE — Discharge Summary (Addendum)
Discharge Summary    Patient ID: [name] [name],  MRN: [id number], DOB/AGE: 70-Dec-1947 70 y.o.  Admit date: 10/07/2017 Discharge date: 10/11/2017  Primary Care Provider: [name] Primary Cardiologist: Rollene Rotunda, MD  Discharge Diagnoses    Principal Problem:   Acute hypoxemic respiratory failure [facility]) Active Problems:   Non-ST elevation (NSTEMI) myocardial infarction [facility])   S/P angioplasty with stent, atherectomy DES to pLAD and DES to mLAD 10/10/17    Type 2 diabetes mellitus without complication (HCC)   Mixed hyperlipidemia   Coronary atherosclerosis   PVD   Essential hypertension   Dyslipidemia   Pulmonary edema   Coronary artery disease involving native coronary artery of native heart with angina pectoris (HCC)   Flash pulmonary edema (HCC)   CAP (community acquired pneumonia)   Allergies Allergies  Allergen Reactions  . Bee Venom Swelling  . Penicillins Itching  . Plavix [Clopidogrel Bisulfate] Other (See Comments)    Patient is noted to be a nonresponder to Plavix    Diagnostic Studies/Procedures    Echo 10/07/17  Study Conclusions  - Left ventricle: The cavity size was normal. Wall thickness was increased in a pattern of mild LVH. The estimated ejection fraction was 45%. There is hypokinesis of the basal-midinferior and inferoseptal myocardium. Doppler parameters are consistent with abnormal left ventricular relaxation (grade 1 diastolic dysfunction). - Aortic valve: Poorly visualized. Moderately calcified annulus. Mildly calcified leaflets. - Mitral valve: Mildly calcified annulus. There was trivial regurgitation. - Right atrium: Central venous pressure (est): 3 mm Hg. - Atrial septum: No defect or patent foramen ovale was identified. - Tricuspid valve: There was trivial regurgitation. - Pulmonary arteries: Systolic pressure could not be accurately estimated. - Pericardium, extracardiac: There was no pericardial  effusion.  Cardiac cath 10/09/17: Conclusion     Prox LAD lesion is 75% stenosed. Heavily calcified, concentric  Mid LAD stent is 70% stenosed.  LV end diastolic pressure is normal.  Ost 1st Diag lesion is 50% stenosed.  Prox Cx lesion is 40% stenosed.  Prox RCA lesion is 70% stenosed.Mid RCA to Dist RCA lesion is 100% stenosed.  Severe two-vessel disease with 100% occlusion of the RCA as previously known, severe in-stent restenosis of the mid LAD with severe concentric calcification of the proximal to mid LAD. Known anomalous circumflex with moderate disease.  Recommendation:   Plan staged atherectomy base PCI of proximal LAD lesion followed by balloon expansion of the mid LAD stent (scheduled for tomorrow)  Restart IV heparin 8 hours after sheath removal  Continue aggressive management of heart failure and CAD risk factors    PCI  10/10/17   FINDINGS: 1. Severe proximal LAD calcified disease (70-80%) and mid LAD ISR (70%). 2. Successful orbital atherectomy and DES placement to proximal LAD using Promus Premier 3.0 x 20 mm stent (post-dilated to 3.6 mm). 3. Successful PCI to mid LAD ISR using Promus Premier 2.5 x 16 mm (post-dilated to 2.8 mm).  RECOMMENDATIONS: 1. DAPT with aspirin and prasugrel for at least 12 months. 2. Aggressive secondary prevention. 3. Remove RFA sheath when ACT < 170 seconds.     _____________   History of Present Illness     72 y.o. male with medical history significant for diabetes type 2, dyslipidemia, hypertension, CAD with prior NSTEMI and prior stent to RCA and,LAD last cath 2015 with occlusion of the mRCA with Lt to Rt collaterals, and patent mLAD stent,  and PVD who presented to the emergency department via EMS after he experienced  sudden onset shortness of breath that awoke him from sleep 10/07/17.   He presented to [facility].  He was in Pulmonary edema and sinus tach at 144 and ST depression.  Also with hypokalemia.   Pt was started on lasix and placed on Bipap  --cardiology consulted.   CTA of chest neg PE, + CAP started on Levaquin.   Hx of nonresponder to Plavix.   Plan was for echo and to transfer to [facility] for cardiac cath and further eval and treatment.  Pt and family agreed.  In the intrium treated at [facility].   Hospital Course     Consultants: cardiology   On the 23rd EF was 45-50% continue ASA, Heparin and ARB and BB.  NTG drip stopped and po imdur added.  He had diuresed and was improved.  It was felt another day before pt should go to [facility] for cath.  On the 24th pt improved and transferred to [facility] where he underwent cath as above.  Plan for PCI with atherectomy the next day.  Pt had PCI as above on the 25th and tolerated well. Post procedure he had elevated BP and treated with IV hydralazine he was also SOB so IV lasix 20 mg given.  10/11/17 he was stable, no SOB or chest pain.  Walked with cardiac rehab without problems.  Was seen and evaluated  By Dr. Tresa Endo and found stable for discharge.  Will follow up in 7-10 days.  He has chronic tachycardia.   He had CAP this admit and placed on Levaquin will continue 6 days post hospital for full 10 days.    Pt cannot afford effient and he is a non responder to plavix. So loaded with Brilinta and will continue with Brilinta.       _____________  Discharge Vitals Blood pressure 133/77, pulse 97, temperature 98 F (36.7 C), temperature source Oral, resp. rate 15, height 5\' 10"  (1.778 m), weight 184 lb 11.9 oz (83.8 kg), SpO2 94 %.  Filed Weights   10/09/17 0500 10/10/17 0406 10/11/17 0303  Weight: 195 lb 1.7 oz (88.5 kg) 194 lb 14.2 oz (88.4 kg) 184 lb 11.9 oz (83.8 kg)    Labs & Radiologic Studies    CBC Recent Labs    10/10/17 0651 10/11/17 0258  WBC 10.9* 13.7*  HGB 14.4 16.7  HCT 43.6 48.8  MCV 85.0 84.0  PLT 197 203   Basic Metabolic Panel Recent Labs    82/99/37 0651 10/11/17 0258  NA 138 137  K 4.1 3.9  CL 107 104  CO2 20*  19*  GLUCOSE 192* 184*  BUN 18 15  CREATININE 0.68 0.79  CALCIUM 8.7* 9.1   Liver Function Tests No results for input(s): AST, ALT, ALKPHOS, BILITOT, PROT, ALBUMIN in the last 72 hours. No results for input(s): LIPASE, AMYLASE in the last 72 hours. Cardiac Enzymes No results for input(s): CKTOTAL, CKMB, CKMBINDEX, TROPONINI in the last 72 hours. BNP Invalid input(s): POCBNP D-Dimer No results for input(s): DDIMER in the last 72 hours. Hemoglobin A1C No results for input(s): HGBA1C in the last 72 hours. Fasting Lipid Panel No results for input(s): CHOL, HDL, LDLCALC, TRIG, CHOLHDL, LDLDIRECT in the last 72 hours. Thyroid Function Tests No results for input(s): TSH, T4TOTAL, T3FREE, THYROIDAB in the last 72 hours.  Invalid input(s): FREET3 _____________  Ct Angio Chest Pe W And/or Wo Contrast  Result Date: 10/07/2017 CLINICAL DATA:  Acute onset of shortness of breath with exertion. Elevated D-dimer. EXAM:  CT ANGIOGRAPHY CHEST WITH CONTRAST TECHNIQUE: Multidetector CT imaging of the chest was performed using the standard protocol during bolus administration of intravenous contrast. Multiplanar CT image reconstructions and MIPs were obtained to evaluate the vascular anatomy. CONTRAST:  ISOVUE-370 IOPAMIDOL (ISOVUE-370) INJECTION 76% COMPARISON:  Chest radiograph performed earlier today at 3:55 a.m. FINDINGS: Cardiovascular:  There is no evidence of pulmonary embolus. The heart is normal in size. Diffuse coronary artery calcifications are seen. Scattered calcification is noted along the thoracic aorta and proximal great vessels. Mediastinum/Nodes: Visualized mediastinal nodes are borderline normal in size. No pericardial effusion is identified. The right thyroid lobe is absent. The left thyroid lobe is unremarkable in appearance. No axillary lymphadenopathy is seen. Lungs/Pleura: Small bilateral pleural effusions are noted. Partial consolidation of both lower lobes is concerning for  pneumonia. Mild additional airspace opacities are noted bilaterally. Underlying interstitial prominence raises concern for mild interstitial edema. Mild bilateral emphysema is noted. There is no evidence of pneumothorax.  No dominant mass is seen. Upper Abdomen: The visualized portions of the liver and spleen are unremarkable. Musculoskeletal: No acute osseous abnormalities are identified. The visualized musculature is unremarkable in appearance. Review of the MIP images confirms the above findings. IMPRESSION: 1. No evidence of pulmonary embolus. 2. Small bilateral pleural effusions. Partial consolidation of both lower lung lobes is concerning for pneumonia. Mild additional airspace opacities noted bilaterally. 3. Underlying interstitial prominence raises concern for mild interstitial edema. 4. Mild bilateral emphysema. 5. Diffuse coronary artery calcifications. Electronically Signed   By: Roanna Raider M.D.   On: 10/07/2017 06:51   Dg Chest Portable 1 View  Result Date: 10/07/2017 CLINICAL DATA:  Acute onset of shortness of breath. EXAM: PORTABLE CHEST 1 VIEW COMPARISON:  Chest radiograph performed 02/16/2014 FINDINGS: The lungs are well-aerated. Vascular congestion is noted. Diffuse increased interstitial markings raise concern for pulmonary edema. There is no evidence of pleural effusion or pneumothorax. The cardiomediastinal silhouette is within normal limits. No acute osseous abnormalities are seen. IMPRESSION: Vascular congestion. Diffusely increased interstitial markings raise concern for pulmonary edema. Electronically Signed   By: Roanna Raider M.D.   On: 10/07/2017 04:17   Disposition   Pt is being discharged home today in good condition.  Follow-up Plans & Appointments  Call [facility] at [phone] if any bleeding, swelling or drainage at cath site.  May shower, no tub baths for 48 hours for groin sticks. No lifting over 5 pounds for 5 days.  No Driving for 3 days.      Do Not begin Metformin until the evening of 10/12/17 may interact with cath dye.   Heart Healthy diabetic diet.    Do not stop Brilinta, this with asprin is to keep stent open.     Follow-up Information    Rollene Rotunda, MD Follow up on 10/17/2017.   Specialty:  Cardiology Why:  at 11:00 AM with Theodore Demark, PA for Dr. Antoine Poche. Contact information: [address] [place] [address] [phone]          Discharge Instructions    Amb Referral to Cardiac Rehabilitation   Complete by:  As directed    Diagnosis:   NSTEMI Coronary Stents        Discharge Medications   Allergies as of 10/11/2017      Reactions   Bee Venom Swelling   Penicillins Itching   Plavix [clopidogrel Bisulfate] Other (See Comments)   Patient is noted to be a nonresponder to Plavix  Medication List    STOP taking these medications   metoprolol tartrate 100 MG tablet Commonly known as:  LOPRESSOR     TAKE these medications   acetaminophen 325 MG tablet Commonly known as:  TYLENOL Take 2 tablets (650 mg total) by mouth every 6 (six) hours as needed for mild pain (or Fever >/= 101).   allopurinol 100 MG tablet Commonly known as:  ZYLOPRIM Take 100 mg by mouth daily.   amLODipine 10 MG tablet Commonly known as:  NORVASC Take 1 tablet (10 mg total) by mouth daily.   aspirin 81 MG tablet Take 81 mg by mouth daily.   atorvastatin 40 MG tablet Commonly known as:  LIPITOR Take 40 mg by mouth daily.   fenofibrate 145 MG tablet Commonly known as:  TRICOR TAKE 1 TABLET BY MOUTH EVERY DAY   Fish Oil Oil Take 1 capsule by mouth daily.   hydrochlorothiazide 12.5 MG capsule Commonly known as:  MICROZIDE Take 12.5 mg by mouth daily.   insulin NPH-regular Human (70-30) 100 UNIT/ML injection Commonly known as:  NOVOLIN 70/30 Inject 48 Units into the skin 2 (two) times daily.   isosorbide mononitrate 120 MG 24 hr tablet Commonly known as:  IMDUR Take 1 tablet  (120 mg total) by mouth daily.   levofloxacin 500 MG tablet Commonly known as:  LEVAQUIN Take 1 tablet (500 mg total) by mouth daily. Start taking on:  10/12/2017   losartan 100 MG tablet Commonly known as:  COZAAR Take 100 mg by mouth daily.   metFORMIN 500 MG tablet Commonly known as:  GLUCOPHAGE Take 1,000 mg by mouth 2 (two) times daily.   metoprolol 200 MG 24 hr tablet Commonly known as:  TOPROL-XL Take 0.5 tablets (100 mg total) by mouth 2 (two) times daily.   nitroGLYCERIN 0.4 MG SL tablet Commonly known as:  NITROSTAT Place 1 tablet (0.4 mg total) under the tongue every 5 (five) minutes as needed for chest pain.   omeprazole 20 MG capsule Commonly known as:  PRILOSEC Take 20 mg by mouth daily.   ticagrelor 90 MG Tabs tablet Commonly known as:  BRILINTA Take 1 tablet (90 mg total) by mouth 2 (two) times daily. Start taking on:  10/12/2017        Aspirin prescribed at discharge?  Yes High Intensity Statin Prescribed? (Lipitor 40-80mg  or Crestor 20-40mg ): Yes Beta Blocker Prescribed? Yes For EF <40%, was ACEI/ARB Prescribed? Yes ADP Receptor Inhibitor Prescribed? (i.e. Plavix etc.-Includes Medically Managed Patients): Yes For EF <40%, Aldosterone Inhibitor Prescribed? No: NA EF 45% Was EF assessed during THIS hospitalization? Yes Was Cardiac Rehab II ordered? (Included Medically managed Patients): Yes   Outstanding Labs/Studies   BMP  WILL NEED HELP WITH BRILINTA HIS COPAY IS $407--HE WILL CALL THE NUMBER ON BACK OF CARD TO SEE IF THEY CAN HELP. HE WAS GIVEN 30 DAY FREE CARD  THE EFFIENT WAS SIMILAR AND NON RESPONDER TO PLAVIX.   Duration of Discharge Encounter   Greater than 30 minutes including physician time.  Signed, Sharlotte Alamo, NP 10/11/2017, 10:27 AM

## 2017-10-11 NOTE — Care Management Note (Signed)
Case Management Note  Patient Details  Name: [name] [name] MRN: [id number] Date of Birth: April 15, 1946  Subjective/Objective:   From home, s/p stent intervention, Celesha RN gave patient the brilinta 30 day free savings coupon, NCM spoke with patient and informed him of his co pay which is 409.00 , NCM instructed patient to call the [phone] phone number on the brilinta card to see if he can get any assistance with the copay amt.  He has a follow up in 2 weeks with his cardiologist also.                  Action/Plan: DC home when ready.  Expected Discharge Date:  10/11/17               Expected Discharge Plan:  Home/Self Care  In-House Referral:     Discharge planning Services  CM Consult, Medication Assistance  Post Acute Care Choice:    Choice offered to:     DME Arranged:    DME Agency:     HH Arranged:    HH Agency:     Status of Service:  Completed, signed off  If discussed at [employer] of Stay Meetings, dates discussed:    Additional Comments:  Leone Haven, RN 10/11/2017, 12:08 PM

## 2017-10-17 ENCOUNTER — Ambulatory Visit (INDEPENDENT_AMBULATORY_CARE_PROVIDER_SITE_OTHER): Payer: Medicare Other | Admitting: Physician Assistant

## 2017-10-17 ENCOUNTER — Encounter: Payer: Self-pay | Admitting: Physician Assistant

## 2017-10-17 VITALS — BP 134/64 | HR 119 | Ht 70.5 in | Wt 181.2 lb

## 2017-10-17 DIAGNOSIS — R000 Tachycardia, unspecified: Secondary | ICD-10-CM

## 2017-10-17 DIAGNOSIS — I10 Essential (primary) hypertension: Secondary | ICD-10-CM | POA: Diagnosis not present

## 2017-10-17 DIAGNOSIS — I214 Non-ST elevation (NSTEMI) myocardial infarction: Secondary | ICD-10-CM

## 2017-10-17 DIAGNOSIS — A0472 Enterocolitis due to Clostridium difficile, not specified as recurrent: Secondary | ICD-10-CM

## 2017-10-17 MED ORDER — METRONIDAZOLE 500 MG PO TABS: 500.0000 mg | ORAL_TABLET | Freq: Three times a day (TID) | ORAL | 0 refills | Status: DC

## 2017-10-17 NOTE — Progress Notes (Signed)
Cardiology Office Note   Date:  10/17/2017   ID:  [name] [name], DOB [birth year], MRN [id number]  PCP:  [name], MD  Cardiologist: Dr. Antoine Poche, 04/01/2017 Theodore Demark, PA-C   Chief Complaint  Patient presents with  . office visit    pt complains of weakness, diarrhea, SOB, fatigue, weight loss believes it is due to new blood thinner,, pt denies chest pain and swelling in hands/feet    History of Present Illness: [name] [name] is a 72 y.o. male with a history of NSTEMI s/p PCI RCA & LAD, cath 2015 w/ RCA 100%, non-obs LAD/Diag dz w/ patent stent, CFX arises from R coronary cusp, DM, HTN, HLD, PAD R>L, mild OSA  Admitted 4/22-4/26/2019 from [facility] with acute hypoxic respiratory failure, CAP>>Levaquin, in pulm edema req BiPAP, hypokalemic, ST depression on ECG, HR 144. EF 45-50% by echo. Tx to [facility] for cath 04/24>>PCI LAD w/ atherectomy 04/25.  Chronic tachycardia seen. At d/c, complete Levaquin, cannot afford Effient and is a nonresponder to Plavix and loaded with Brilinta  [name] [name] presents for cardiology follow up.  He has completed the antibiotics. He has had diarrhea daily since he got out of the hospital.  He states it is watery diarrhea, does not see any blood, and it has a foul odor.  He has DOE. He can walk a little but tires quickly. No LE edema.   He wakes frequently during the night. Does not think it is from SOB. Poor sleep is a chronic problem.  The dyspnea on exertion has not changed much since he got out of the hospital.  He is extremely fatigued, thinks it might be from the diarrhea.  He has not had chest pain.   No doses of Brilinta missed.    Past Medical History:  Diagnosis Date  . CAP (community acquired pneumonia) 10/11/2017  . Coronary artery disease    a. Cath: 2000 nonobs dz. b. 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 s/p BMS to LAD & RCA ; s/p PCI/DES dRCA & PDA x2 2011. d. cath 02/17/14: LM patent, LAD  calcified, diffuse irregs, no high grade stenosis mLAD stent patent, LCx, diffuse irregs no high-grade dz, RCA total occ w/ L to R collats, medical Rx rec  . Diabetes mellitus   . Diastolic dysfunction    a. echo 02/18/14: EF 45-50%, mild LVH, AK of inferior and inferoseptal myocardium, GR1DD  . Hiatal hernia   . Hyperlipidemia   . LV dysfunction   . NSTEMI (non-ST elevated myocardial infarction) (HCC) 10/07/2017  . PVD (peripheral vascular disease) (HCC)    ABIs of 0.69 on right and 0.93 on left  . Renal artery stenosis (HCC)    Mild, bilateral  . S/P angioplasty with stent, atherectomy DES to pLAD and DES to [facility] 10/10/17  10/11/2017  . Sleep apnea    Mild    Past Surgical History:  Procedure Laterality Date  . CATARACT EXTRACTION    . CORONARY ATHERECTOMY N/A 10/10/2017   Procedure: CORONARY ATHERECTOMY - CSI;  Surgeon: Yvonne Kendall, MD;  Location: [facility] INV[facility] CV LAB;  Service: Cardiovascular;  Laterality: N/A;  . CORONARY STENT INTERVENTION N/A 10/10/2017   Procedure: CORONARY STENT INTERVENTION;  Surgeon: Yvonne Kendall, MD;  Location: [facility] INVASIVE CV LAB;  Service: Cardiovascular;  Laterality: N/A;  . HIATAL HERNIA REPAIR    . INTRAVASCULAR ULTRASOUND/IVUS N/A 10/09/2017   Procedure: Intravascular Ultrasound/IVUS;  Surgeon: [name], MD;  Location:  [facility]VASIVE CV LAB;  Service: Cardiovascular;  Laterality: N/A;  . LEFT HEART CATH AND CORONARY ANGIOGRAPHY N/A 10/09/2017   Procedure: LEFT HEART CATH AND CORONARY ANGIOGRAPHY;  Surgeon: [name], MD;  Location: [facility] INVASIVE CV LAB;  Service: Cardiovascular;  Laterality: N/A;  . LEFT HEART CATHETERIZATION WITH CORONARY ANGIOGRAM N/A 02/17/2014   Procedure: LEFT HEART CATHETERIZATION WITH CORONARY ANGIOGRAM;  Surgeon: Micheline Chapman, MD;  Location: [facility] CATH LAB;  Service: Cardiovascular;  Laterality: N/A;  . SHOULDER ARTHROSCOPY     Right  . 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 SOFT TISSUE TUMOR  RESECTION     Benign neck tumor  . Vocal cord polyp removal      Current Outpatient Medications  Medication Sig Dispense Refill  . allopurinol (ZYLOPRIM) 100 MG tablet Take 100 mg by mouth daily.      Marland Kitchen amLODipine (NORVASC) 10 MG tablet Take 1 tablet (10 mg total) by mouth daily. 90 tablet 3  . aspirin 81 MG tablet Take 81 mg by mouth daily.      Marland Kitchen atorvastatin (LIPITOR) 40 MG tablet Take 1 tablet (40 mg total) by mouth daily. 30 tablet 6  . fenofibrate (TRICOR) 145 MG tablet Take 1 tablet (145 mg total) by mouth daily. 30 tablet 11  . hydrochlorothiazide (MICROZIDE) 12.5 MG capsule Take 12.5 mg by mouth daily.    . insulin NPH-regular Human (NOVOLIN 70/30) (70-30) 100 UNIT/ML injection Inject 48 Units into the skin 2 (two) times daily.    . isosorbide mononitrate (IMDUR) 120 MG 24 hr tablet Take 1 tablet (120 mg total) by mouth daily. 90 tablet 3  . losartan (COZAAR) 100 MG tablet Take 100 mg by mouth daily.    . metFORMIN (GLUCOPHAGE) 500 MG tablet Take 1,000 mg by mouth 2 (two) times daily.    . metoprolol (TOPROL-XL) 200 MG 24 hr tablet Take 0.5 tablets (100 mg total) by mouth 2 (two) times daily. 90 tablet 3  . omeprazole (PRILOSEC) 20 MG capsule Take 20 mg by mouth daily.    . ticagrelor (BRILINTA) 90 MG TABS tablet Take 1 tablet (90 mg total) by mouth 2 (two) times daily. 180 tablet 4  . acetaminophen (TYLENOL) 325 MG tablet Take 2 tablets (650 mg total) by mouth every 6 (six) hours as needed for mild pain (or Fever >/= 101). (Patient not taking: Reported on 10/17/2017)    . Fish Oil OIL Take 1 capsule by mouth daily.      . nitroGLYCERIN (NITROSTAT) 0.4 MG SL tablet Place 1 tablet (0.4 mg total) under the tongue every 5 (five) minutes as needed for chest pain. (Patient not taking: Reported on 10/17/2017) 25 tablet 11   No current facility-administered medications for this visit.     Allergies:   Bee venom; Penicillins; and Plavix [clopidogrel bisulfate]    Social History:  The  patient  reports that he quit smoking about 14 years ago. He has never used smokeless tobacco. He reports that he does not drink alcohol or use drugs.   Family History:  The patient's family history includes Cancer in his brother and mother; Heart attack (age of onset: 64) in his father.    ROS:  Please see the history of present illness. All other systems are reviewed and negative.    PHYSICAL EXAM: VS:  BP 134/64 (BP Location: Left Arm)   Pulse (!) 119   Ht 5' 10.5" (1.791 m)  Wt 181 lb 3.2 oz (82.2 kg)   BMI 25.63 kg/m  , BMI Body mass index is 25.63 kg/m. GEN: Well nourished, well developed, male in no acute distress  HEENT: normal for age  Neck: no JVD, no carotid bruit, no masses Cardiac: RRR; no murmur, no rubs, or gallops Respiratory: Scattered dry rales bilaterally, normal work of breathing GI: soft, nontender, nondistended, + BS MS: no deformity or atrophy; no edema; distal pulses are 2+ in all 4 extremities   Skin: warm and dry, no rash Neuro:  Strength and sensation are weak but intact Psych: euthymic mood, full affect   EKG:  EKG is ordered today. The ekg ordered today demonstrates sinus tachycardia, heart rate 114, inferolateral T wave changes are similar to 10/11/2017 ECG  Echo 10/07/17 Study Conclusions - Left ventricle: The cavity size was normal. Wall thickness was increased in a pattern of mild LVH. The estimated ejection fraction was 45%. There is hypokinesis of the basal-midinferior and inferoseptal myocardium. Doppler parameters are consistent with abnormal left ventricular relaxation (grade 1 diastolic dysfunction). - Aortic valve: Poorly visualized. Moderately calcified annulus. Mildly calcified leaflets. - Mitral valve: Mildly calcified annulus. There was trivial regurgitation. - Right atrium: Central venous pressure (est): 3 mm Hg. - Atrial septum: No defect or patent foramen ovale was identified. - Tricuspid valve: There was  trivial regurgitation. - Pulmonary arteries: Systolic pressure could not be accurately estimated. - Pericardium, extracardiac: There was no pericardial effusion.  Cardiac cath 10/09/17: Conclusion    Prox LAD lesion is 75% stenosed. Heavily calcified, concentric  Mid LAD stent is 70% stenosed.  LV end diastolic pressure is normal.  Ost 1st Diag lesion is 50% stenosed.  Prox Cx lesion is 40% stenosed.  Prox RCA lesion is 70% stenosed.Mid RCA to Dist RCA lesion is 100% stenosed.  Severe two-vessel disease with 100% occlusion of the RCA as previously known, severe in-stent restenosis of the mid LAD with severe concentric calcification of the proximal to mid LAD. Known anomalous circumflex with moderate disease.  Recommendation:   Plan staged atherectomy base PCI of proximal LAD lesion followed by balloon expansion of the mid LAD stent (scheduled for tomorrow)  Restart IV heparin 8 hours after sheath removal  Continue aggressive management of heart failure and CAD risk factors    PCI 10/10/17  FINDINGS: 1. Severe proximal LAD calcified disease (70-80%) and mid LAD ISR (70%). 2. Successful orbital atherectomy and DES placement to proximal LAD using Promus Premier 3.0 x 20 mm stent (post-dilated to 3.6 mm). 3. Successful PCI to mid LAD ISR using Promus Premier 2.5 x 16 mm (post-dilated to 2.8 mm). RECOMMENDATIONS: 1. DAPT with aspirin and prasugrel for at least 12 months. 2. Aggressive secondary prevention. 3. Remove RFA sheath when ACT < 170 seconds.  Recent Labs: 10/07/2017: Magnesium 1.4 10/11/2017: B Natriuretic Peptide 312.8; BUN 15; Creatinine, Ser 0.79; Hemoglobin 16.7; Platelets 203; Potassium 3.9; Sodium 137    Lipid Panel    Component Value Date/Time   CHOL 132 05/30/2010 0938   TRIG 790 (H) 05/30/2010 0938   HDL 24 (L) 05/30/2010 0938   CHOLHDL 5.5 Ratio 05/30/2010 0938   VLDL NOT CALC mg/dL 16/03/9603 5409   LDLCALC See Comment mg/dL 81/19/1478  2956     Wt Readings from Last 3 Encounters:  10/17/17 181 lb 3.2 oz (82.2 kg)  10/11/17 184 lb 11.9 oz (83.8 kg)  04/01/17 198 lb 3.2 oz (89.9 kg)     Other studies Reviewed: Additional studies/ records  that were reviewed today include: Office notes, hospital records and testing.  ASSESSMENT AND PLAN:  1.  Non-STEMI: He is compliant with the aspirin and Brilinta.  Continue those meds plus Imdur and metoprolol.  2.  Hypertension: Blood pressure is minimally above goal, no med changes  3.  Sinus tachycardia: He was having sinus tach when he was in the hospital.  Is not having chest pain with this.  Continue beta-blocker.  4.  Probable C. difficile: He was on antibiotics in the hospital, and the diarrhea started the day he left.  It has continued.  He is completing the antibiotics today.  Reviewed the situation with the pharmacist and he should take metronidazole 500 mg 3 times daily for 14 days.   Current medicines are reviewed at length with the patient today.  The patient does not have concerns regarding medicines.  The following changes have been made: Add metronidazole  Labs/ tests ordered today include:  No orders of the defined types were placed in this encounter.    Disposition:   FU with Dr. Antoine Poche  Signed, Theodore Demark, PA-C  10/17/2017 10:34 AM    [facility] Phone: [phone]; Fax: [phone]  This note was written with the assistance of speech recognition software. Please excuse any transcriptional errors.

## 2017-10-17 NOTE — Patient Instructions (Signed)
Medication Instructions:  START- MetroNidazole 500 mg three times a day for 7 days  If you need a refill on your cardiac medications before your next appointment, please call your pharmacy.  Labwork: None Ordered   Testing/Procedures: None Ordered  Follow-Up: Your physician wants you to follow-up in: 3 Months with Dr Antoine Poche.     Thank you for choosing [facility] at [facility]!!

## 2017-10-22 NOTE — Addendum Note (Signed)
Addended by: Barrie Dunker on: 10/22/2017 11:20 AM   Modules accepted: Orders

## 2017-10-29 ENCOUNTER — Telehealth: Payer: Self-pay | Admitting: Cardiovascular Disease

## 2017-10-29 ENCOUNTER — Other Ambulatory Visit: Payer: Self-pay | Admitting: Physician Assistant

## 2017-10-29 NOTE — Telephone Encounter (Signed)
I do not write for this medication.  Call Mr. Juanetta Gosling with the results and send results to [name], MD

## 2017-10-29 NOTE — Telephone Encounter (Signed)
°*  STAT* If patient is at the pharmacy, call can be transferred to refill team.   1. Which medications need to be refilled? (please list name of each medication and dose if known) Need another prescription for Metronidazole  2. Which pharmacy/location (including street and city if local pharmacy) is medication to be sent to? [facility]- [phone]  3. Do they need a 30 day or 90 day supply? 30

## 2017-10-31 NOTE — Telephone Encounter (Signed)
Spoke with pt letting him know Dr Antoine Poche does not fill this medication, pt voice understanding and stated he have an appt with his PCP next week and will asked them to refill medication

## 2017-11-04 ENCOUNTER — Ambulatory Visit (HOSPITAL_COMMUNITY)
Admission: RE | Admit: 2017-11-04 | Discharge: 2017-11-04 | Disposition: A | Discharge status: Home / Self Care | Payer: Medicare Other | Source: Ambulatory Visit | Attending: Family Medicine | Admitting: Family Medicine

## 2017-11-04 ENCOUNTER — Telehealth: Payer: Self-pay | Admitting: Cardiology

## 2017-11-04 ENCOUNTER — Other Ambulatory Visit (HOSPITAL_COMMUNITY): Payer: Self-pay | Admitting: Family Medicine

## 2017-11-04 DIAGNOSIS — Z8701 Personal history of pneumonia (recurrent): Secondary | ICD-10-CM | POA: Insufficient documentation

## 2017-11-04 DIAGNOSIS — J9811 Atelectasis: Secondary | ICD-10-CM | POA: Diagnosis not present

## 2017-11-04 DIAGNOSIS — J189 Pneumonia, unspecified organism: Secondary | ICD-10-CM

## 2017-11-04 NOTE — Telephone Encounter (Signed)
Patient made aware that samples are available at the front:  Medication samples have been provided to the patient.  Drug name: Brlinta       Strength: 90 mg         Qty: 3 bottles  LOT: [id number]  Exp.Date: 1.22

## 2017-11-04 NOTE — Telephone Encounter (Signed)
New message   Patient calling the office for samples of medication:   1.  What medication and dosage are you requesting samples for? ticagrelor (BRILINTA) 90 MG TABS tablet  2.  Are you currently out of this medication? 3 REMAINING

## 2017-12-16 ENCOUNTER — Telehealth: Payer: Self-pay | Admitting: Cardiology

## 2017-12-16 NOTE — Telephone Encounter (Signed)
New Message    Patient calling the office for samples of medication:   1.  What medication and dosage are you requesting samples for? agrelor (BRILINTA) 90 MG TABS tablet  2.  Are you currently out of this medication? 2 days

## 2017-12-18 NOTE — Telephone Encounter (Signed)
Brilinta 90 mg #4 [id number] exp 3/22 at front for pick, patient aware

## 2018-01-22 NOTE — Progress Notes (Signed)
HPI The patient presents for follow up of his CAD.  He was admitted Sept 2015 with non-ST elevation MI. He was found to have an occluded right coronary artery but patent LAD stents. He was managed medically.   Since I last saw him he was in the hospital in April.  He had pneumonia.  He had C. difficile.  He also had non-Q wave MI with disease as listed below and ended up with staged PCI.  Ejection fraction was 45% which is essentially unchanged from previous.  Since then he is done well.  He has not had any of the severe symptoms that he had at that time.  Is not having any resting shortness of breath, PND or orthopnea.  Is not having any palpitations, presyncope or syncope.  The diarrhea is cleared up.  He works 22 hours a week at [employer].  He does have some leg cramping occasionally.   Allergies  Allergen Reactions  . Bee Venom Swelling  . Penicillins Itching  . Plavix [Clopidogrel Bisulfate] Other (See Comments)    Patient is noted to be a nonresponder to Plavix    Current Outpatient Medications  Medication Sig Dispense Refill  . acetaminophen (TYLENOL) 325 MG tablet Take 2 tablets (650 mg total) by mouth every 6 (six) hours as needed for mild pain (or Fever >/= 101).    Marland Kitchen allopurinol (ZYLOPRIM) 100 MG tablet Take 100 mg by mouth daily.      Marland Kitchen amLODipine (NORVASC) 10 MG tablet Take 1 tablet (10 mg total) by mouth daily. 90 tablet 3  . aspirin 81 MG tablet Take 81 mg by mouth daily.      Marland Kitchen atorvastatin (LIPITOR) 40 MG tablet Take 1 tablet (40 mg total) by mouth daily. 30 tablet 6  . fenofibrate (TRICOR) 145 MG tablet Take 1 tablet (145 mg total) by mouth daily. 30 tablet 11  . Fish Oil OIL Take 1 capsule by mouth daily.      . hydrochlorothiazide (MICROZIDE) 12.5 MG capsule Take 12.5 mg by mouth daily.    . insulin NPH-regular Human (NOVOLIN 70/30) (70-30) 100 UNIT/ML injection Inject 48 Units into the skin 2 (two) times daily.    . isosorbide mononitrate (IMDUR) 120 MG 24 hr tablet  Take 1 tablet (120 mg total) by mouth daily. 90 tablet 3  . losartan (COZAAR) 100 MG tablet Take 100 mg by mouth daily.    . metFORMIN (GLUCOPHAGE) 500 MG tablet Take 1,000 mg by mouth 2 (two) times daily.    . metoprolol (TOPROL-XL) 200 MG 24 hr tablet Take 0.5 tablets (100 mg total) by mouth 2 (two) times daily. 90 tablet 3  . nitroGLYCERIN (NITROSTAT) 0.4 MG SL tablet Place 1 tablet (0.4 mg total) under the tongue every 5 (five) minutes as needed for chest pain. 25 tablet 11  . omeprazole (PRILOSEC) 20 MG capsule Take 20 mg by mouth daily.    . ticagrelor (BRILINTA) 90 MG TABS tablet Take 1 tablet (90 mg total) by mouth 2 (two) times daily. 32 tablet 0   No current facility-administered medications for this visit.     Past Medical History:  Diagnosis Date  . CAP (community acquired pneumonia) 10/11/2017  . Coronary artery disease    a. Cath: 2000 nonobs dz. b. 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 s/p BMS to LAD & RCA ; s/p PCI/DES dRCA & PDA x2 2011. d. cath 02/17/14: LM patent, LAD calcified,  diffuse irregs, no high grade stenosis mLAD stent patent, LCx, diffuse irregs no high-grade dz, RCA total occ w/ L to R collats, medical Rx rec  . Diabetes mellitus   . Diastolic dysfunction    a. echo 02/18/14: EF 45-50%, mild LVH, AK of inferior and inferoseptal myocardium, GR1DD  . Hiatal hernia   . Hyperlipidemia   . LV dysfunction   . NSTEMI (non-ST elevated myocardial infarction) (HCC) 10/07/2017  . PVD (peripheral vascular disease) (HCC)    ABIs of 0.69 on right and 0.93 on left  . Renal artery stenosis (HCC)    Mild, bilateral  . S/P angioplasty with stent, atherectomy DES to pLAD and DES to [facility] 10/10/17  10/11/2017  . Sleep apnea    Mild    Past Surgical History:  Procedure Laterality Date  . CATARACT EXTRACTION    . CORONARY ATHERECTOMY N/A 10/10/2017   Procedure: CORONARY ATHERECTOMY - CSI;  Surgeon: Yvonne Kendall, MD;  Location: [facility] INVASIVE CV  LAB;  Service: Cardiovascular;  Laterality: N/A;  . CORONARY STENT INTERVENTION N/A 10/10/2017   Procedure: CORONARY STENT INTERVENTION;  Surgeon: Yvonne Kendall, MD;  Location: [facility] INVASIVE CV LAB;  Service: Cardiovascular;  Laterality: N/A;  . HIATAL HERNIA REPAIR    . INTRAVASCULAR ULTRASOUND/IVUS N/A 10/09/2017   Procedure: Intravascular Ultrasound/IVUS;  Surgeon: [name], MD;  Location: [facility] INVASIVE CV LAB;  Service: Cardiovascular;  Laterality: N/A;  . LEFT HEART CATH AND CORONARY ANGIOGRAPHY N/A 10/09/2017   Procedure: LEFT HEART CATH AND CORONARY ANGIOGRAPHY;  Surgeon: [name], MD;  Location: [facility] INVASIVE CV LAB;  Service: Cardiovascular;  Laterality: N/A;  . LEFT HEART CATHETERIZATION WITH CORONARY ANGIOGRAM N/A 02/17/2014   Procedure: LEFT HEART CATHETERIZATION WITH CORONARY ANGIOGRAM;  Surgeon: Micheline Chapman, MD;  Location: [facility] CATH LAB;  Service: Cardiovascular;  Laterality: N/A;  . SHOULDER ARTHROSCOPY     Right  . 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 SOFT TISSUE TUMOR RESECTION     Benign neck tumor  . Vocal cord polyp removal      ROS:   As stated in the HPI and negative for all other systems.   PHYSICAL EXAM BP (!) 166/78   Pulse 73   Ht 5' 10.5" (1.791 m)   Wt 191 lb 9.6 oz (86.9 kg)   BMI 27.10 kg/m   GENERAL:  Well appearing NECK:  No jugular venous distention, waveform within normal limits, carotid upstroke brisk and symmetric, no bruits, no thyromegaly LUNGS:  Clear to auscultation bilaterally CHEST:  Unremarkable HEART:  PMI not displaced or sustained,S1 and S2 within normal limits, no S3, no S4, no clicks, no rubs, no murmurs ABD:  Flat, positive bowel sounds normal in frequency in pitch, positive bruits, no rebound, no guarding, no midline pulsatile mass, no hepatomegaly, no splenomegaly EXT:  2 plus pulses upper and decreased DP/PT bilateral lower, femoral bruits. , no edema, no cyanosis no clubbing SKIN:   Brusing   EKG:  Sinus rhythm, rate 73, axis within normal limits, intervals within normal limits, left ventricular hypertrophy  with repolarization changes, unchanged from previous. PVCs  01/23/2018   Cardiac cath 10/09/17: Conclusion     Prox LAD lesion is 75% stenosed. Heavily calcified, concentric  Mid LAD stent is 70% stenosed.  LV end diastolic pressure is normal.  Ost 1st Diag lesion is 50% stenosed.  Prox Cx lesion is 40% stenosed.  Prox RCA lesion is 70% stenosed.Mid RCA to  Dist RCA lesion is 100% stenosed.  Severe two-vessel disease with 100% occlusion of the RCA as previously known, severe in-stent restenosis of the mid LAD with severe concentric calcification of the proximal to mid LAD. Known anomalous circumflex with moderate disease.  Recommendation:   Plan staged atherectomy base PCI of proximal LAD lesion followed by balloon expansion of the mid LAD stent (scheduled for tomorrow)  Restart IV heparin 8 hours after sheath removal  Continue aggressive management of heart failure and CAD risk factors    PCI 10/10/17  FINDINGS: 1. Severe proximal LAD calcified disease (70-80%) and mid LAD ISR (70%). 2. Successful orbital atherectomy and DES placement to proximal LAD using Promus Premier 3.0 x 20 mm stent (post-dilated to 3.6 mm). 3. Successful PCI to mid LAD ISR using Promus Premier 2.5 x 16 mm (post-dilated to 2.8 mm).  RECOMMENDATIONS: 1. DAPT with aspirin and prasugrel for at least 12 months. 2. Aggressive secondary prevention. 3. Remove RFA sheath when ACT < 170 seconds.     ASSESSMENT AND PLAN  CAD:   The patient has no new sypmtoms.  No further cardiovascular testing is indicated.  We will continue with aggressive risk reduction and meds as listed.  We are trying to help him afford his Brilinta.    HTN:   The blood pressure is high so he is going to keep a BP diary.   DM:    Per Dr Shawnee Knapp (endocrinology).  His A1C was 7.6  PVD: He has stable  claudication.   He is not having this.  No change in therapy.  DYSLIPIDEMIA:   He has LDL was 35 and HDL 33.  Trig were better.  Therapy per [name], MD   BRUIT:  He had mild carotid plaque in 2017.  He is overdue for followup.  He is overdue for follow up.  I will arrange this.

## 2018-01-23 ENCOUNTER — Ambulatory Visit (INDEPENDENT_AMBULATORY_CARE_PROVIDER_SITE_OTHER): Payer: Medicare Other | Admitting: Cardiology

## 2018-01-23 ENCOUNTER — Encounter: Payer: Self-pay | Admitting: Cardiology

## 2018-01-23 VITALS — BP 166/78 | HR 73 | Ht 70.5 in | Wt 191.6 lb

## 2018-01-23 DIAGNOSIS — I2510 Atherosclerotic heart disease of native coronary artery without angina pectoris: Secondary | ICD-10-CM | POA: Diagnosis not present

## 2018-01-23 DIAGNOSIS — I214 Non-ST elevation (NSTEMI) myocardial infarction: Secondary | ICD-10-CM

## 2018-01-23 DIAGNOSIS — R0989 Other specified symptoms and signs involving the circulatory and respiratory systems: Secondary | ICD-10-CM | POA: Diagnosis not present

## 2018-01-23 DIAGNOSIS — E785 Hyperlipidemia, unspecified: Secondary | ICD-10-CM

## 2018-01-23 MED ORDER — TICAGRELOR 90 MG PO TABS: 90.0000 mg | ORAL_TABLET | Freq: Two times a day (BID) | ORAL | 0 refills | Status: DC

## 2018-01-23 NOTE — Patient Instructions (Addendum)
NO MEDICATION CHANGES--  WILL CHECK ON ASSISTANCE WITH BRILINTA , IF NOT WILL CHANGE MEDICATION  ( PATIENT RECEIVED- PATIENT ASSISTANCE FORM TO FILL OUT AND RETURN TO OFFICE )    SCHEDULE AT [address] Your physician has requested that you have a carotid duplex. This test is an ultrasound of the carotid arteries in your neck. It looks at blood flow through these arteries that supply the brain with blood. Allow one hour for this exam. There are no restrictions or special instructions.    Your physician wants you to follow-up in 6 MONTHS DR [facility].You will receive a reminder letter in the mail two months in advance. If you don't receive a letter, please call our office to schedule the follow-up appointment.

## 2018-02-03 ENCOUNTER — Ambulatory Visit (HOSPITAL_COMMUNITY)
Admission: RE | Admit: 2018-02-03 | Discharge: 2018-02-03 | Disposition: A | Discharge status: Home / Self Care | Payer: Medicare Other | Source: Ambulatory Visit | Attending: Cardiovascular Disease | Admitting: Cardiovascular Disease

## 2018-02-03 DIAGNOSIS — R0989 Other specified symptoms and signs involving the circulatory and respiratory systems: Secondary | ICD-10-CM | POA: Insufficient documentation

## 2018-03-12 ENCOUNTER — Telehealth: Payer: Self-pay | Admitting: Cardiology

## 2018-03-12 NOTE — Telephone Encounter (Signed)
New message   Patient calling the office for samples of medication:   1.  What medication and dosage are you requesting samples for?Brilinta   2.  Are you currently out of this medication? no

## 2018-03-12 NOTE — Telephone Encounter (Signed)
Spoke with pt. Adv pt tthat samples of Brilinta 90mg  will be left at the front desk for him to pick up.  Pt voiced appreciation for the assistance.

## 2018-04-24 ENCOUNTER — Other Ambulatory Visit: Payer: Self-pay | Admitting: Cardiology

## 2018-05-09 ENCOUNTER — Telehealth: Payer: Self-pay | Admitting: Cardiology

## 2018-05-09 NOTE — Telephone Encounter (Signed)
Patient calling the office for samples of medication: ° ° °1.  What medication and dosage are you requesting samples for? Brilinta ° °2.  Are you currently out of this medication? No, 2 days left ° ° ° °

## 2018-05-09 NOTE — Telephone Encounter (Signed)
Called patient, unable to speak to him. Patient phone rang and rang, did not answer. Will place samples up front.   Medication given: Brilinta 90mg  Lot #: [id number] Expiration: 12/2020 Qty: 3 bottles.

## 2018-05-12 NOTE — Telephone Encounter (Signed)
Attempted to contact patient again regarding samples.  Advised they were up front, gave number to call back if any questions. Will remove from Triage.

## 2018-05-17 ENCOUNTER — Other Ambulatory Visit: Payer: Self-pay | Admitting: Cardiology

## 2018-06-30 ENCOUNTER — Telehealth: Payer: Self-pay | Admitting: Cardiology

## 2018-06-30 ENCOUNTER — Telehealth: Payer: Self-pay | Admitting: Cardiovascular Disease

## 2018-06-30 NOTE — Telephone Encounter (Signed)
Wrong provide °

## 2018-06-30 NOTE — Telephone Encounter (Signed)
Patient calling the office for samples of medication: ° ° °1.  What medication and dosage are you requesting samples for? Brilinta ° °2.  Are you currently out of this medication?  2 left ° ° ° °

## 2018-06-30 NOTE — Telephone Encounter (Signed)
Left message samples ready for pick up  Brilinata 90 mg  #4 lot # [id number] exp 7/22

## 2018-07-20 ENCOUNTER — Inpatient Hospital Stay (HOSPITAL_COMMUNITY)
Admission: EM | Admit: 2018-07-20 | Discharge: 2018-07-22 | DRG: 246 | Disposition: A | Discharge status: Home / Self Care | Payer: Medicare Other | Attending: Internal Medicine | Admitting: Internal Medicine

## 2018-07-20 ENCOUNTER — Encounter (HOSPITAL_COMMUNITY): Payer: Self-pay | Admitting: Emergency Medicine

## 2018-07-20 ENCOUNTER — Other Ambulatory Visit: Payer: Self-pay

## 2018-07-20 ENCOUNTER — Emergency Department (HOSPITAL_COMMUNITY): Payer: Medicare Other

## 2018-07-20 DIAGNOSIS — G4733 Obstructive sleep apnea (adult) (pediatric): Secondary | ICD-10-CM | POA: Diagnosis present

## 2018-07-20 DIAGNOSIS — E1151 Type 2 diabetes mellitus with diabetic peripheral angiopathy without gangrene: Secondary | ICD-10-CM | POA: Diagnosis present

## 2018-07-20 DIAGNOSIS — I252 Old myocardial infarction: Secondary | ICD-10-CM | POA: Diagnosis not present

## 2018-07-20 DIAGNOSIS — I501 Left ventricular failure: Secondary | ICD-10-CM | POA: Diagnosis not present

## 2018-07-20 DIAGNOSIS — Z87891 Personal history of nicotine dependence: Secondary | ICD-10-CM

## 2018-07-20 DIAGNOSIS — I2510 Atherosclerotic heart disease of native coronary artery without angina pectoris: Secondary | ICD-10-CM | POA: Diagnosis present

## 2018-07-20 DIAGNOSIS — Z888 Allergy status to other drugs, medicaments and biological substances status: Secondary | ICD-10-CM | POA: Diagnosis not present

## 2018-07-20 DIAGNOSIS — I5032 Chronic diastolic (congestive) heart failure: Secondary | ICD-10-CM | POA: Diagnosis present

## 2018-07-20 DIAGNOSIS — I739 Peripheral vascular disease, unspecified: Secondary | ICD-10-CM | POA: Diagnosis present

## 2018-07-20 DIAGNOSIS — E782 Mixed hyperlipidemia: Secondary | ICD-10-CM | POA: Diagnosis present

## 2018-07-20 DIAGNOSIS — Z8249 Family history of ischemic heart disease and other diseases of the circulatory system: Secondary | ICD-10-CM

## 2018-07-20 DIAGNOSIS — I10 Essential (primary) hypertension: Secondary | ICD-10-CM | POA: Diagnosis present

## 2018-07-20 DIAGNOSIS — Z9849 Cataract extraction status, unspecified eye: Secondary | ICD-10-CM | POA: Diagnosis not present

## 2018-07-20 DIAGNOSIS — Z7901 Long term (current) use of anticoagulants: Secondary | ICD-10-CM | POA: Diagnosis not present

## 2018-07-20 DIAGNOSIS — Z809 Family history of malignant neoplasm, unspecified: Secondary | ICD-10-CM

## 2018-07-20 DIAGNOSIS — J810 Acute pulmonary edema: Secondary | ICD-10-CM | POA: Diagnosis present

## 2018-07-20 DIAGNOSIS — I214 Non-ST elevation (NSTEMI) myocardial infarction: Secondary | ICD-10-CM | POA: Diagnosis present

## 2018-07-20 DIAGNOSIS — E785 Hyperlipidemia, unspecified: Secondary | ICD-10-CM | POA: Diagnosis present

## 2018-07-20 DIAGNOSIS — E119 Type 2 diabetes mellitus without complications: Secondary | ICD-10-CM

## 2018-07-20 DIAGNOSIS — I701 Atherosclerosis of renal artery: Secondary | ICD-10-CM | POA: Diagnosis present

## 2018-07-20 DIAGNOSIS — Z880 Allergy status to penicillin: Secondary | ICD-10-CM

## 2018-07-20 DIAGNOSIS — R778 Other specified abnormalities of plasma proteins: Secondary | ICD-10-CM | POA: Diagnosis present

## 2018-07-20 DIAGNOSIS — R079 Chest pain, unspecified: Secondary | ICD-10-CM | POA: Diagnosis present

## 2018-07-20 DIAGNOSIS — R7989 Other specified abnormal findings of blood chemistry: Secondary | ICD-10-CM

## 2018-07-20 DIAGNOSIS — K449 Diaphragmatic hernia without obstruction or gangrene: Secondary | ICD-10-CM | POA: Diagnosis present

## 2018-07-20 DIAGNOSIS — R0602 Shortness of breath: Secondary | ICD-10-CM

## 2018-07-20 DIAGNOSIS — Z7984 Long term (current) use of oral hypoglycemic drugs: Secondary | ICD-10-CM | POA: Diagnosis not present

## 2018-07-20 DIAGNOSIS — Z95820 Peripheral vascular angioplasty status with implants and grafts: Secondary | ICD-10-CM

## 2018-07-20 DIAGNOSIS — R0902 Hypoxemia: Secondary | ICD-10-CM | POA: Diagnosis present

## 2018-07-20 DIAGNOSIS — I110 Hypertensive heart disease with heart failure: Secondary | ICD-10-CM | POA: Diagnosis not present

## 2018-07-20 DIAGNOSIS — T82855A Stenosis of coronary artery stent, initial encounter: Secondary | ICD-10-CM | POA: Diagnosis present

## 2018-07-20 DIAGNOSIS — Z955 Presence of coronary angioplasty implant and graft: Secondary | ICD-10-CM | POA: Diagnosis not present

## 2018-07-20 DIAGNOSIS — Z91030 Bee allergy status: Secondary | ICD-10-CM | POA: Diagnosis not present

## 2018-07-20 DIAGNOSIS — Z7982 Long term (current) use of aspirin: Secondary | ICD-10-CM

## 2018-07-20 DIAGNOSIS — Z794 Long term (current) use of insulin: Secondary | ICD-10-CM

## 2018-07-20 LAB — COMPREHENSIVE METABOLIC PANEL
ALK PHOS: 53 U/L (ref 38–126)
ALT: 22 U/L (ref 0–44)
AST: 23 U/L (ref 15–41)
Albumin: 3.7 g/dL (ref 3.5–5.0)
Anion gap: 11 (ref 5–15)
BUN: 20 mg/dL (ref 8–23)
CO2: 22 mmol/L (ref 22–32)
Calcium: 8.3 mg/dL — ABNORMAL LOW (ref 8.9–10.3)
Chloride: 107 mmol/L (ref 98–111)
Creatinine, Ser: 1 mg/dL (ref 0.61–1.24)
GFR calc Af Amer: >60 mL/min (ref >60)
GFR calc non Af Amer: >60 mL/min (ref >60)
Glucose, Bld: 323 mg/dL — ABNORMAL HIGH (ref 70–99)
Potassium: 3.1 mmol/L — ABNORMAL LOW (ref 3.5–5.1)
SODIUM: 140 mmol/L (ref 135–145)
Total Bilirubin: 0.3 mg/dL (ref 0.3–1.2)
Total Protein: 6.5 g/dL (ref 6.5–8.1)

## 2018-07-20 LAB — CBC WITH DIFFERENTIAL/PLATELET
Abs Immature Granulocytes: 0.05 10*3/uL (ref 0.00–0.07)
Basophils Absolute: 0.1 10*3/uL (ref 0.0–0.1)
Basophils Relative: 1 %
Eosinophils Absolute: 0.1 10*3/uL (ref 0.0–0.5)
Eosinophils Relative: 1 %
HCT: 43.1 % (ref 39.0–52.0)
Hemoglobin: 13.6 g/dL (ref 13.0–17.0)
Immature Granulocytes: 1 %
Lymphocytes Relative: 29 %
Lymphs Abs: 3 10*3/uL (ref 0.7–4.0)
MCH: 27 pg (ref 26.0–34.0)
MCHC: 31.6 g/dL (ref 30.0–36.0)
MCV: 85.7 fL (ref 80.0–100.0)
MONO ABS: 0.5 10*3/uL (ref 0.1–1.0)
MONOS PCT: 5 %
Neutro Abs: 6.5 10*3/uL (ref 1.7–7.7)
Neutrophils Relative %: 63 %
Platelets: 201 10*3/uL (ref 150–400)
RBC: 5.03 MIL/uL (ref 4.22–5.81)
RDW: 13.8 % (ref 11.5–15.5)
WBC: 10.1 10*3/uL (ref 4.0–10.5)
nRBC: 0 % (ref 0.0–0.2)

## 2018-07-20 LAB — TROPONIN I: Troponin I: 0.08 ng/mL (ref <0.03)

## 2018-07-20 LAB — INFLUENZA PANEL BY PCR (TYPE A & B)
INFLAPCR: NEGATIVE
Influenza B By PCR: NEGATIVE

## 2018-07-20 LAB — PROTIME-INR
INR: 1
PROTHROMBIN TIME: 13.1 s (ref 11.4–15.2)

## 2018-07-20 LAB — BRAIN NATRIURETIC PEPTIDE: B Natriuretic Peptide: 116 pg/mL — ABNORMAL HIGH (ref 0.0–100.0)

## 2018-07-20 LAB — APTT: aPTT: 27 seconds (ref 24–36)

## 2018-07-20 MED ORDER — HEPARIN BOLUS VIA INFUSION
4000.0000 [IU] | Freq: Once | INTRAVENOUS | Status: AC
  Administered 2018-07-20: 4000 [IU] via INTRAVENOUS

## 2018-07-20 MED ORDER — HEPARIN (PORCINE) 25000 UT/250ML-% IV SOLN
1200.0000 [IU]/h | INTRAVENOUS | Status: DC
  Administered 2018-07-20: 1200 [IU]/h via INTRAVENOUS
  Filled 2018-07-20: qty 250

## 2018-07-20 MED ORDER — ASPIRIN 81 MG PO CHEW
324.0000 mg | CHEWABLE_TABLET | Freq: Once | ORAL | Status: AC
  Administered 2018-07-20: 324 mg via ORAL
  Filled 2018-07-20: qty 4

## 2018-07-20 MED ORDER — NITROGLYCERIN 2 % TD OINT
1.0000 [in_us] | TOPICAL_OINTMENT | Freq: Once | TRANSDERMAL | Status: AC
  Administered 2018-07-20: 1 [in_us] via TOPICAL
  Filled 2018-07-20: qty 1

## 2018-07-20 MED ORDER — NITROGLYCERIN IN D5W 200-5 MCG/ML-% IV SOLN
5.0000 ug/min | INTRAVENOUS | Status: DC
  Administered 2018-07-20: 5 ug/min via INTRAVENOUS
  Filled 2018-07-20: qty 250

## 2018-07-20 MED ORDER — FUROSEMIDE 10 MG/ML IJ SOLN
60.0000 mg | Freq: Once | INTRAMUSCULAR | Status: AC
  Administered 2018-07-20: 60 mg via INTRAVENOUS
  Filled 2018-07-20: qty 6

## 2018-07-20 NOTE — ED Notes (Signed)
CRITICAL VALUE ALERT  Critical Value:  Troponin 0.08  Date & Time Notied:  07/20/2018 2221  Provider Notified: Alona Bene, MD  Orders Received/Actions taken: n/a

## 2018-07-20 NOTE — ED Provider Notes (Signed)
Emergency Department Provider Note   I have reviewed the triage vital signs and the nursing notes.   HISTORY  Chief Complaint Chest Pain   HPI [name] [name] is a 73 y.o. male with PMH of CAD, sCHF, DM, HLD, and PVD presents to the emergency department for evaluation of chest pain and shortness of breath.  Patient's symptoms began abruptly and he contacted EMS.  No fevers or chills.  Chest pain is pressure-like and in the center of his chest.  No radiation.  Patient unsure if this feels like his prior heart attack pain.  EMS arrived on scene and found him to be hypoxemic.  He was started on nonrebreather and transition to nasal cannula.  They attempted to place a CPAP on the patient but he became agitated and and they had to discontinue that in the field.  Patient states he is feeling somewhat better on nasal cannula.  Symptoms are worse with lying flat.  Past Medical History:  Diagnosis Date  . CAP (community acquired pneumonia) 10/11/2017  . Coronary artery disease    a. Cath: 2000 nonobs dz. b. 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 s/p BMS to LAD & RCA ; s/p PCI/DES dRCA & PDA x2 2011. d. cath 02/17/14: LM patent, LAD calcified, diffuse irregs, no high grade stenosis mLAD stent patent, LCx, diffuse irregs no high-grade dz, RCA total occ w/ L to R collats, medical Rx rec  . Diabetes mellitus   . Diastolic dysfunction    a. echo 02/18/14: EF 45-50%, mild LVH, AK of inferior and inferoseptal myocardium, GR1DD  . Hiatal hernia   . Hyperlipidemia   . LV dysfunction   . NSTEMI (non-ST elevated myocardial infarction) (HCC) 10/07/2017  . PVD (peripheral vascular disease) (HCC)    ABIs of 0.69 on right and 0.93 on left  . Renal artery stenosis (HCC)    Mild, bilateral  . S/P angioplasty with stent, atherectomy DES to pLAD and DES to [facility] 10/10/17  10/11/2017  . Sleep apnea    Mild    Patient Active Problem List   Diagnosis Date Noted  . S/P angioplasty  with stent, atherectomy DES to pLAD and DES to [facility] 10/10/17  10/11/2017  . CAP (community acquired pneumonia) 10/11/2017  . NSTEMI (non-ST elevated myocardial infarction) (HCC) 10/09/2017  . Coronary artery disease involving native coronary artery of native heart with angina pectoris (HCC)   . Flash pulmonary edema (HCC)   . Acute hypoxemic respiratory failure (HCC) 10/07/2017  . Pulmonary edema 10/07/2017  . Essential hypertension 04/02/2017  . Dyslipidemia 04/02/2017  . Bruit 04/02/2017  . Intermediate coronary syndrome (HCC) 02/16/2014  . Non-ST elevation (NSTEMI) myocardial infarction (HCC) 02/16/2014  . PVD 04/26/2010  . Type 2 diabetes mellitus without complication (HCC) 04/25/2010  . Mixed hyperlipidemia 04/25/2010  . Coronary atherosclerosis 04/25/2010  . RENAL ARTERY STENOSIS 04/25/2010  . SLEEP APNEA 04/25/2010    Past Surgical History:  Procedure Laterality Date  . CATARACT EXTRACTION    . CORONARY ATHERECTOMY N/A 10/10/2017   Procedure: CORONARY ATHERECTOMY - CSI;  Surgeon: Yvonne Kendall, MD;  Location: [facility] INVASIVE CV LAB;  Service: Cardiovascular;  Laterality: N/A;  . CORONARY STENT INTERVENTION N/A 10/10/2017   Procedure: CORONARY STENT INTERVENTION;  Surgeon: Yvonne Kendall, MD;  Location: [facility] INVASIVE CV LAB;  Service: Cardiovascular;  Laterality: N/A;  . HIATAL HERNIA REPAIR    . INTRAVASCULAR ULTRASOUND/IVUS N/A 10/09/2017   Procedure: Intravascular Ultrasound/IVUS;  Surgeon:  Marykay LexHarding, David W, MD;  [facility];  Service: Cardiovascular;  Laterality: N/A;  . LEFT HEART CATH AND CORONARY ANGIOGRAPHY N/A 10/09/2017   Procedure: LEFT HEART CATH AND CORONARY ANGIOGRAPHY;  Surgeon: Marykay LexHarding, David W, MD;  [facility];  Service: Cardiovascular;  Laterality: N/A;  . LEFT HEART CATHETERIZATION WITH CORONARY ANGIOGRAM N/A 02/17/2014   Procedure: LEFT HEART CATHETERIZATION WITH CORONARY ANGIOGRAM;  Surgeon: Micheline ChapmanMichael D Cooper, MD;  [facility];  Service: Cardiovascular;  Laterality: N/A;  . SHOULDER ARTHROSCOPY     Right  . 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 SOFT TISSUE TUMOR RESECTION     Benign neck tumor  . Vocal cord polyp removal      Allergies Bee venom; Penicillins; and Plavix [clopidogrel bisulfate]  Family History  Problem Relation Age of Onset  . Heart attack Father 161  . Cancer Mother   . Cancer Brother     Social History Social History   Tobacco Use  . Smoking status: Former Smoker    Last attempt to quit: 06/19/2003    Years since quitting: 15.0  . Smokeless tobacco: Never Used  Substance Use Topics  . Alcohol use: No  . Drug use: No    Review of Systems  Constitutional: No fever/chills Eyes: No visual changes. ENT: No sore throat. Cardiovascular: Positive chest pain. Respiratory: Positive shortness of breath. Gastrointestinal: No abdominal pain.  No nausea, no vomiting.  No diarrhea.  No constipation. Genitourinary: Negative for dysuria. Musculoskeletal: Negative for back pain. Skin: Negative for rash. Neurological: Negative for headaches, focal weakness or numbness.  10-point ROS otherwise negative.  ____________________________________________   PHYSICAL EXAM:  VITAL SIGNS: ED Triage Vitals  Enc Vitals Group     BP 07/20/18 2111 (!) 168/115     Pulse Rate 07/20/18 2111 (!) 123     Resp 07/20/18 2111 20     Temp 07/20/18 2111 97.8 F (36.6 C)     Temp Source 07/20/18 2111 Oral     SpO2 07/20/18 2111 90 %     Weight 07/20/18 2107 200 lb (90.7 kg)     Height 07/20/18 2107 5\' 11"  (1.803 m)     Pain Score 07/20/18 2107 0   Constitutional: Alert and oriented. Increased WOB that is mild/moderate. Able to provide a full history.  Eyes: Conjunctivae are normal. Head: Atraumatic. Nose: No congestion/rhinnorhea. Mouth/Throat: Mucous membranes are moist.   Cardiovascular: Sinus tachycardia. Good peripheral circulation. Grossly normal heart  sounds.   Respiratory: Increased respiratory effort.  No retractions. Lungs with bilateral crackles at the bases.  Gastrointestinal: Soft and nontender. No distention.  Musculoskeletal: No lower extremity tenderness with 1+ pitting edema bilaterally. No gross deformities of extremities. Neurologic:  Normal speech and language. No gross focal neurologic deficits are appreciated.  Skin:  Skin is warm, dry and intact. No rash noted.  ____________________________________________   LABS (all labs ordered are listed, but only abnormal results are displayed)  Labs Reviewed  COMPREHENSIVE METABOLIC PANEL - Abnormal; Notable for the following components:      Result Value   Potassium 3.1 (*)    Glucose, Bld 323 (*)    Calcium 8.3 (*)    All other components within normal limits  BRAIN NATRIURETIC PEPTIDE - Abnormal; Notable for the following components:   B Natriuretic Peptide 116.0 (*)    All other components within normal limits  TROPONIN I - Abnormal; Notable for  the following components:   Troponin I 0.08 (*)    All other components within normal limits  CBC WITH DIFFERENTIAL/PLATELET  INFLUENZA PANEL BY PCR (TYPE A & B)  APTT  PROTIME-INR   ____________________________________________  EKG   EKG Interpretation  Date/Time:  Sunday July 20 2018 21:16:06 EST Ventricular Rate:  120 PR Interval:    QRS Duration: 99 QT Interval:  328 QTC Calculation: 464 R Axis:   78 Text Interpretation:  Sinus tachycardia Probable LVH with secondary repol abnrm No STEMI. Similar ST changes compared to prior.  Confirmed by Alona BeneLong, Patsie Mccardle [phone]([id number]) on 07/20/2018 9:20:33 PM       ____________________________________________  RADIOLOGY  Dg Chest Portable 1 View  Result Date: 07/20/2018 CLINICAL DATA:  Chest pressure and rattling in the chest while breathing. Hypertensive. EXAM: PORTABLE CHEST 1 VIEW COMPARISON:  11/04/2017 FINDINGS: Cardiac enlargement with pulmonary vascular congestion and  diffuse alveolar and interstitial infiltration, likely edema. No blunting of costophrenic angles. No pneumothorax. Mediastinal contours appear intact. Calcification of the aorta. IMPRESSION: Cardiac enlargement with pulmonary vascular congestion and bilateral edema. Electronically Signed   By: Burman NievesWilliam  Stevens M.D.   On: 07/20/2018 21:25    ____________________________________________   PROCEDURES  Procedure(s) performed:   Procedures  CRITICAL CARE Performed by: Maia PlanJoshua G Lorraina Spring Total critical care time: 45 minutes Critical care time was exclusive of separately billable procedures and treating other patients. Critical care was necessary to treat or prevent imminent or life-threatening deterioration. Critical care was time spent personally by me on the following activities: development of treatment plan with patient and/or surrogate as well as nursing, discussions with consultants, evaluation of patient's response to treatment, examination of patient, obtaining history from patient or surrogate, ordering and performing treatments and interventions, ordering and review of laboratory studies, ordering and review of radiographic studies, pulse oximetry and re-evaluation of patient's condition.  Alona BeneJoshua Sharmain Lastra, MD Emergency Medicine  ____________________________________________   INITIAL IMPRESSION / ASSESSMENT AND PLAN / ED COURSE  Pertinent labs & imaging results that were available during my care of the patient were reviewed by me and considered in my medical decision making (see chart for details).  Patient presents to the emergency department for evaluation of chest pain and shortness of breath.  He has rales on exam.  Afebrile.  Chest pain improved with nitroglycerin.  Possible flash pulmonary edema with sudden onset symptoms and elevated blood pressure seem to be improved with nitroglycerin.  Patient did not tolerate CPAP in the field but is doing reasonably well and I do not think needs  BiPAP immediately.  Plan for Lasix and continued monitoring.  10:45 PM Patient's repeat EKG remains ischemic.  This is similar to his April tracing but seems worse.  Patient is having active chest pain but greatly reduced after nitroglycerin.  His troponin is 0.08 with only mild elevation in BNP.  Vital signs including tachycardia are improving.  My suspicion for PE is low and given the positional nature of the patient shortness of breath I do not feel that lying the patient flat for CT is advantageous at this time.  Plan to start heparin and nitro infusion if chest pain returns.  I spoke with Dr. Okey Dupreose, covering for cardiology, who reviewed the EKG and lab work.  He accepts the patient under [facility].  I have placed the temporary admit order at his request.  Updated patient and family. ____________________________________________  FINAL CLINICAL IMPRESSION(S) / ED DIAGNOSES  Final diagnoses:  NSTEMI (  non-ST elevated myocardial infarction) (HCC)  Acute pulmonary edema (HCC)  SOB (shortness of breath)     MEDICATIONS GIVEN DURING THIS VISIT:  Medications  heparin bolus via infusion 4,000 Units (has no administration in time range)    Followed by  heparin ADULT infusion 100 units/mL (25000 units/25mL sodium chloride 0.45%) (has no administration in time range)  nitroGLYCERIN 50 mg in dextrose 5 % 250 mL (0.2 mg/mL) infusion (has no administration in time range)  aspirin chewable tablet 324 mg (324 mg Oral Given 07/20/18 2121)  nitroGLYCERIN (NITROGLYN) 2 % ointment 1 inch (1 inch Topical Given 07/20/18 2121)  furosemide (LASIX) injection 60 mg (60 mg Intravenous Given 07/20/18 2213)    Note:  This document was prepared using Dragon voice recognition software and may include unintentional dictation errors.  Alona Bene, MD Emergency Medicine    Tura Roller, Arlyss Repress, MD 07/20/18 [phone]

## 2018-07-20 NOTE — ED Triage Notes (Signed)
Pt brought in by [facility] c/o chest pressure and "rattling" in chest when breathing. Pt hypertensive upon arrival, EMS gave two SL nitro, pt refused asa and cpap. Lung sounds crackles bilateral, more in lower

## 2018-07-20 NOTE — Progress Notes (Signed)
ANTICOAGULATION CONSULT NOTE - Preliminary  Pharmacy Consult for heparin Indication: chest pain/ACS  Allergies  Allergen Reactions  . Bee Venom Swelling  . Penicillins Itching  . Plavix [Clopidogrel Bisulfate] Other (See Comments)    Patient is noted to be a nonresponder to Plavix    Patient Measurements: Height: 5\' 11"  (180.3 cm) Weight: 200 lb (90.7 kg) IBW/kg (Calculated) : 75.3 HEPARIN DW (KG): 90.7   Vital Signs: Temp: 97.8 F (36.6 C) (02/02 2111) Temp Source: Oral (02/02 2111) BP: 181/96 (02/02 2215) Pulse Rate: 122 (02/02 2215)  Labs: Recent Labs    07/20/18 2127  HGB 13.6  HCT 43.1  PLT 201  CREATININE 1.00  TROPONINI 0.08*   Estimated Creatinine Clearance: 79.2 mL/min (by C-G formula based on SCr of 1 mg/dL).  Medical History: Past Medical History:  Diagnosis Date  . CAP (community acquired pneumonia) 10/11/2017  . Coronary artery disease    a. Cath: 2000 nonobs dz. b. 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 s/p BMS to LAD & RCA ; s/p PCI/DES dRCA & PDA x2 2011. d. cath 02/17/14: LM patent, LAD calcified, diffuse irregs, no high grade stenosis mLAD stent patent, LCx, diffuse irregs no high-grade dz, RCA total occ w/ L to R collats, medical Rx rec  . Diabetes mellitus   . Diastolic dysfunction    a. echo 02/18/14: EF 45-50%, mild LVH, AK of inferior and inferoseptal myocardium, GR1DD  . Hiatal hernia   . Hyperlipidemia   . LV dysfunction   . NSTEMI (non-ST elevated myocardial infarction) (HCC) 10/07/2017  . PVD (peripheral vascular disease) (HCC)    ABIs of 0.69 on right and 0.93 on left  . Renal artery stenosis (HCC)    Mild, bilateral  . S/P angioplasty with stent, atherectomy DES to pLAD and DES to [facility] 10/10/17  10/11/2017  . Sleep apnea    Mild    Medications:  Infusions:  . heparin    . nitroGLYCERIN      Assessment: Asked to dose heparin for 73 yo male that presented to Ed with chest pain and SOB.  Tropionin  0.08.  EKG ischemic.  Patient to transfer to [facility] stepdown.  Baseline labs ordered, no anticoag at home.   Goal of Therapy:  Heparin level 0.3-0.7 units/ml   Plan:  Give 4000 units bolus x 1 Start heparin infusion at 1200 units/hr Check anti-Xa level in 6 hours and daily while on heparin Continue to monitor H&H and platelets Preliminary review of pertinent patient information completed.  Jeani Hawking clinical pharmacist will complete review during morning rounds to assess the patient and finalize treatment regimen.  Loyola Mast, RPH 07/20/2018,10:39 PM

## 2018-07-21 ENCOUNTER — Encounter (HOSPITAL_COMMUNITY)
Admission: EM | Disposition: A | Discharge status: Home / Self Care | Payer: Self-pay | Source: Home / Self Care | Attending: Internal Medicine

## 2018-07-21 DIAGNOSIS — R7989 Other specified abnormal findings of blood chemistry: Secondary | ICD-10-CM

## 2018-07-21 DIAGNOSIS — I10 Essential (primary) hypertension: Secondary | ICD-10-CM

## 2018-07-21 DIAGNOSIS — I2510 Atherosclerotic heart disease of native coronary artery without angina pectoris: Secondary | ICD-10-CM

## 2018-07-21 DIAGNOSIS — J810 Acute pulmonary edema: Secondary | ICD-10-CM

## 2018-07-21 DIAGNOSIS — R778 Other specified abnormalities of plasma proteins: Secondary | ICD-10-CM | POA: Diagnosis present

## 2018-07-21 HISTORY — PX: CORONARY STENT INTERVENTION: CATH118234

## 2018-07-21 HISTORY — PX: CORONARY ULTRASOUND/IVUS: CATH118244

## 2018-07-21 HISTORY — PX: LEFT HEART CATH AND CORONARY ANGIOGRAPHY: CATH118249

## 2018-07-21 LAB — CBC
HEMATOCRIT: 40.1 % (ref 39.0–52.0)
HEMOGLOBIN: 13.3 g/dL (ref 13.0–17.0)
MCH: 27.7 pg (ref 26.0–34.0)
MCHC: 33.2 g/dL (ref 30.0–36.0)
MCV: 83.5 fL (ref 80.0–100.0)
Platelets: 204 10*3/uL (ref 150–400)
RBC: 4.8 MIL/uL (ref 4.22–5.81)
RDW: 13.8 % (ref 11.5–15.5)
WBC: 11.2 10*3/uL — ABNORMAL HIGH (ref 4.0–10.5)
nRBC: 0.2 % (ref 0.0–0.2)

## 2018-07-21 LAB — GLUCOSE, CAPILLARY
GLUCOSE-CAPILLARY: 218 mg/dL — AB (ref 70–99)
Glucose-Capillary: 210 mg/dL — ABNORMAL HIGH (ref 70–99)
Glucose-Capillary: 140 mg/dL — ABNORMAL HIGH (ref 70–99)
Glucose-Capillary: 188 mg/dL — ABNORMAL HIGH (ref 70–99)

## 2018-07-21 LAB — BASIC METABOLIC PANEL
Anion gap: 14 (ref 5–15)
BUN: 26 mg/dL — ABNORMAL HIGH (ref 8–23)
CHLORIDE: 108 mmol/L (ref 98–111)
CO2: 21 mmol/L — AB (ref 22–32)
Calcium: 8.5 mg/dL — ABNORMAL LOW (ref 8.9–10.3)
Creatinine, Ser: 0.93 mg/dL (ref 0.61–1.24)
GFR calc Af Amer: >60 mL/min (ref >60)
GFR calc non Af Amer: >60 mL/min (ref >60)
Glucose, Bld: 275 mg/dL — ABNORMAL HIGH (ref 70–99)
Potassium: 3.5 mmol/L (ref 3.5–5.1)
Sodium: 143 mmol/L (ref 135–145)

## 2018-07-21 LAB — HEPARIN LEVEL (UNFRACTIONATED): Heparin Unfractionated: 0.15 IU/mL — ABNORMAL LOW (ref 0.30–0.70)

## 2018-07-21 LAB — MAGNESIUM: Magnesium: 1.4 mg/dL — ABNORMAL LOW (ref 1.7–2.4)

## 2018-07-21 LAB — POCT ACTIVATED CLOTTING TIME
ACTIVATED CLOTTING TIME: 257 s
Activated Clotting Time: 125 seconds

## 2018-07-21 LAB — TROPONIN I
Troponin I: 4.46 ng/mL (ref <0.03)
Troponin I: 6 ng/mL (ref <0.03)

## 2018-07-21 SURGERY — LEFT HEART CATH AND CORONARY ANGIOGRAPHY
Anesthesia: LOCAL

## 2018-07-21 MED ORDER — LIDOCAINE HCL (PF) 1 % IJ SOLN
INTRAMUSCULAR | Status: AC
  Filled 2018-07-21: qty 30

## 2018-07-21 MED ORDER — ENOXAPARIN SODIUM 40 MG/0.4ML ~~LOC~~ SOLN: 40.0000 mg | SUBCUTANEOUS | Status: DC

## 2018-07-21 MED ORDER — MIDAZOLAM HCL 2 MG/2ML IJ SOLN
INTRAMUSCULAR | Status: AC
  Filled 2018-07-21: qty 2

## 2018-07-21 MED ORDER — THE SENSUOUS HEART BOOK
Freq: Once | Status: AC
  Administered 2018-07-21: 23:00:00
  Filled 2018-07-21: qty 1

## 2018-07-21 MED ORDER — SODIUM CHLORIDE 0.9% FLUSH: 3.0000 mL | Freq: Two times a day (BID) | INTRAVENOUS | Status: DC

## 2018-07-21 MED ORDER — FUROSEMIDE 10 MG/ML IJ SOLN
60.0000 mg | Freq: Once | INTRAMUSCULAR | Status: AC
  Administered 2018-07-21: 60 mg via INTRAVENOUS
  Filled 2018-07-21: qty 6

## 2018-07-21 MED ORDER — HEART ATTACK BOUNCING BOOK
Freq: Once | Status: AC
  Administered 2018-07-21: 23:00:00
  Filled 2018-07-21: qty 1

## 2018-07-21 MED ORDER — HEPARIN (PORCINE) IN NACL 1000-0.9 UT/500ML-% IV SOLN
INTRAVENOUS | Status: AC
  Filled 2018-07-21: qty 500

## 2018-07-21 MED ORDER — FENOFIBRATE 160 MG PO TABS
160.0000 mg | ORAL_TABLET | Freq: Every day | ORAL | Status: DC
  Administered 2018-07-21 – 2018-07-22 (×2): 160 mg via ORAL
  Filled 2018-07-21 (×2): qty 1

## 2018-07-21 MED ORDER — PANTOPRAZOLE SODIUM 40 MG PO TBEC
40.0000 mg | DELAYED_RELEASE_TABLET | Freq: Every day | ORAL | Status: DC
  Administered 2018-07-21 – 2018-07-22 (×2): 40 mg via ORAL
  Filled 2018-07-21 (×2): qty 1

## 2018-07-21 MED ORDER — ANGIOPLASTY BOOK
Freq: Once | Status: AC
  Administered 2018-07-21: 23:00:00
  Filled 2018-07-21: qty 1

## 2018-07-21 MED ORDER — MAGNESIUM SULFATE 4 GM/100ML IV SOLN
4.0000 g | Freq: Once | INTRAVENOUS | Status: AC
  Administered 2018-07-21: 4 g via INTRAVENOUS
  Filled 2018-07-21 (×2): qty 100

## 2018-07-21 MED ORDER — ENOXAPARIN SODIUM 40 MG/0.4ML ~~LOC~~ SOLN
40.0000 mg | SUBCUTANEOUS | Status: DC
  Administered 2018-07-22: 40 mg via SUBCUTANEOUS
  Filled 2018-07-21: qty 0.4

## 2018-07-21 MED ORDER — ASPIRIN EC 81 MG PO TBEC
81.0000 mg | DELAYED_RELEASE_TABLET | Freq: Every morning | ORAL | Status: DC
  Administered 2018-07-21 – 2018-07-22 (×2): 81 mg via ORAL
  Filled 2018-07-21 (×2): qty 1

## 2018-07-21 MED ORDER — INSULIN NPH (HUMAN) (ISOPHANE) 100 UNIT/ML ~~LOC~~ SUSP
12.0000 [IU] | Freq: Two times a day (BID) | SUBCUTANEOUS | Status: DC
  Administered 2018-07-21 – 2018-07-22 (×3): 12 [IU] via SUBCUTANEOUS
  Filled 2018-07-21 (×2): qty 10

## 2018-07-21 MED ORDER — LOSARTAN POTASSIUM 50 MG PO TABS
100.0000 mg | ORAL_TABLET | Freq: Every morning | ORAL | Status: DC
  Administered 2018-07-21 – 2018-07-22 (×2): 100 mg via ORAL
  Filled 2018-07-21 (×2): qty 2

## 2018-07-21 MED ORDER — ALLOPURINOL 100 MG PO TABS
100.0000 mg | ORAL_TABLET | Freq: Every morning | ORAL | Status: DC
  Administered 2018-07-21 – 2018-07-22 (×2): 100 mg via ORAL
  Filled 2018-07-21 (×2): qty 1

## 2018-07-21 MED ORDER — SODIUM CHLORIDE 0.9 % WEIGHT BASED INFUSION: 3.0000 mL/kg/h | INTRAVENOUS | Status: DC

## 2018-07-21 MED ORDER — SODIUM CHLORIDE 0.9% FLUSH: 3.0000 mL | INTRAVENOUS | Status: DC | PRN

## 2018-07-21 MED ORDER — ONDANSETRON HCL 4 MG/2ML IJ SOLN
4.0000 mg | Freq: Four times a day (QID) | INTRAMUSCULAR | Status: DC | PRN
  Filled 2018-07-21: qty 2

## 2018-07-21 MED ORDER — HEPARIN (PORCINE) IN NACL 1000-0.9 UT/500ML-% IV SOLN
INTRAVENOUS | Status: DC | PRN
  Administered 2018-07-21 (×2): 500 mL

## 2018-07-21 MED ORDER — ACETAMINOPHEN 325 MG PO TABS
650.0000 mg | ORAL_TABLET | ORAL | Status: DC | PRN
  Administered 2018-07-21: 22:00:00 650 mg via ORAL
  Filled 2018-07-21: qty 2

## 2018-07-21 MED ORDER — POTASSIUM CHLORIDE CRYS ER 20 MEQ PO TBCR
40.0000 meq | EXTENDED_RELEASE_TABLET | Freq: Once | ORAL | Status: AC
  Administered 2018-07-21: 40 meq via ORAL
  Filled 2018-07-21: qty 2

## 2018-07-21 MED ORDER — ATORVASTATIN CALCIUM 40 MG PO TABS
40.0000 mg | ORAL_TABLET | Freq: Every evening | ORAL | Status: DC
  Administered 2018-07-21: 40 mg via ORAL
  Filled 2018-07-21: qty 1

## 2018-07-21 MED ORDER — CARVEDILOL 12.5 MG PO TABS
25.0000 mg | ORAL_TABLET | Freq: Two times a day (BID) | ORAL | Status: DC
  Administered 2018-07-21 – 2018-07-22 (×3): 25 mg via ORAL
  Filled 2018-07-21 (×2): qty 2
  Filled 2018-07-21: qty 1

## 2018-07-21 MED ORDER — INSULIN ASPART 100 UNIT/ML ~~LOC~~ SOLN
6.0000 [IU] | Freq: Three times a day (TID) | SUBCUTANEOUS | Status: DC
  Administered 2018-07-21 – 2018-07-22 (×3): 6 [IU] via SUBCUTANEOUS

## 2018-07-21 MED ORDER — HYDRALAZINE HCL 20 MG/ML IJ SOLN: 5.0000 mg | INTRAMUSCULAR | Status: AC | PRN

## 2018-07-21 MED ORDER — BIVALIRUDIN BOLUS VIA INFUSION - CUPID
INTRAVENOUS | Status: DC | PRN
  Administered 2018-07-21: 67.8 mg via INTRAVENOUS

## 2018-07-21 MED ORDER — INSULIN ASPART 100 UNIT/ML ~~LOC~~ SOLN
0.0000 [IU] | Freq: Three times a day (TID) | SUBCUTANEOUS | Status: DC
  Administered 2018-07-21: 5 [IU] via SUBCUTANEOUS
  Administered 2018-07-21: 19:00:00 2 [IU] via SUBCUTANEOUS
  Administered 2018-07-21: 5 [IU] via SUBCUTANEOUS
  Administered 2018-07-22: 13:00:00 2 [IU] via SUBCUTANEOUS
  Administered 2018-07-22: 07:00:00 3 [IU] via SUBCUTANEOUS

## 2018-07-21 MED ORDER — SODIUM CHLORIDE 0.9 % IV SOLN: 250.0000 mL | INTRAVENOUS | Status: DC | PRN

## 2018-07-21 MED ORDER — IOHEXOL 350 MG/ML SOLN
INTRAVENOUS | Status: DC | PRN
  Administered 2018-07-21: 215 mL via INTRAVENOUS

## 2018-07-21 MED ORDER — BIVALIRUDIN TRIFLUOROACETATE 250 MG IV SOLR
INTRAVENOUS | Status: AC
  Filled 2018-07-21: qty 250

## 2018-07-21 MED ORDER — LABETALOL HCL 5 MG/ML IV SOLN: 10.0000 mg | INTRAVENOUS | Status: AC | PRN

## 2018-07-21 MED ORDER — SODIUM CHLORIDE 0.9% FLUSH
3.0000 mL | Freq: Two times a day (BID) | INTRAVENOUS | Status: DC
  Administered 2018-07-21: 3 mL via INTRAVENOUS

## 2018-07-21 MED ORDER — MIDAZOLAM HCL 2 MG/2ML IJ SOLN
INTRAMUSCULAR | Status: DC | PRN
  Administered 2018-07-21 (×3): 1 mg via INTRAVENOUS

## 2018-07-21 MED ORDER — LIDOCAINE HCL (PF) 1 % IJ SOLN
INTRAMUSCULAR | Status: DC | PRN
  Administered 2018-07-21: 7 mL

## 2018-07-21 MED ORDER — AMLODIPINE BESYLATE 10 MG PO TABS
10.0000 mg | ORAL_TABLET | Freq: Every day | ORAL | Status: DC
  Administered 2018-07-21 – 2018-07-22 (×2): 10 mg via ORAL
  Filled 2018-07-21 (×2): qty 1

## 2018-07-21 MED ORDER — TICAGRELOR 90 MG PO TABS
90.0000 mg | ORAL_TABLET | Freq: Two times a day (BID) | ORAL | Status: DC
  Administered 2018-07-21 – 2018-07-22 (×3): 90 mg via ORAL
  Filled 2018-07-21 (×3): qty 1

## 2018-07-21 MED ORDER — NITROGLYCERIN 1 MG/10 ML FOR IR/CATH LAB
INTRA_ARTERIAL | Status: AC
  Filled 2018-07-21: qty 10

## 2018-07-21 MED ORDER — SODIUM CHLORIDE 0.9 % IV SOLN
INTRAVENOUS | Status: DC | PRN
  Administered 2018-07-21 (×2): 1.75 mg/kg/h via INTRAVENOUS

## 2018-07-21 MED ORDER — SODIUM CHLORIDE 0.9 % WEIGHT BASED INFUSION: 1.0000 mL/kg/h | INTRAVENOUS | Status: DC

## 2018-07-21 MED ORDER — SODIUM CHLORIDE 0.9 % IV SOLN
INTRAVENOUS | Status: AC
  Administered 2018-07-21: 18:00:00 via INTRAVENOUS

## 2018-07-21 MED ORDER — FENTANYL CITRATE (PF) 100 MCG/2ML IJ SOLN
INTRAMUSCULAR | Status: AC
  Filled 2018-07-21: qty 2

## 2018-07-21 MED ORDER — FENTANYL CITRATE (PF) 100 MCG/2ML IJ SOLN
INTRAMUSCULAR | Status: DC | PRN
  Administered 2018-07-21 (×4): 25 ug via INTRAVENOUS

## 2018-07-21 MED ORDER — ISOSORBIDE MONONITRATE ER 60 MG PO TB24
120.0000 mg | ORAL_TABLET | Freq: Every morning | ORAL | Status: DC
  Administered 2018-07-21: 120 mg via ORAL
  Filled 2018-07-21: qty 2

## 2018-07-21 MED ORDER — NITROGLYCERIN 1 MG/10 ML FOR IR/CATH LAB
INTRA_ARTERIAL | Status: DC | PRN
  Administered 2018-07-21: 200 ug via INTRA_ARTERIAL

## 2018-07-21 MED ORDER — HEPARIN (PORCINE) 25000 UT/250ML-% IV SOLN
1400.0000 [IU]/h | INTRAVENOUS | Status: DC
  Administered 2018-07-21: 1500 [IU]/h via INTRAVENOUS

## 2018-07-21 SURGICAL SUPPLY — 25 items
BALLN EMERGE MR 3.5X12 (BALLOONS) ×2
BALLN SAPPHIRE 2.0X12 (BALLOONS) ×2
BALLN SAPPHIRE ~~LOC~~ 2.5X12 (BALLOONS) ×1 IMPLANT
BALLN SAPPHIRE ~~LOC~~ 4.0X12 (BALLOONS) ×1 IMPLANT
BALLOON EMERGE MR 3.5X12 (BALLOONS) IMPLANT
BALLOON SAPPHIRE 2.0X12 (BALLOONS) IMPLANT
CATH INFINITI 5FR MULTPACK ANG (CATHETERS) ×1 IMPLANT
CATH LAUNCHER 6FR EBU 3 (CATHETERS) ×1 IMPLANT
CATH OPTICROSS 40MHZ (CATHETERS) ×1 IMPLANT
ELECT DEFIB PAD ADLT CADENCE (PAD) ×1 IMPLANT
KIT ENCORE 26 ADVANTAGE (KITS) ×1 IMPLANT
KIT HEART LEFT (KITS) ×2 IMPLANT
KIT MICROPUNCTURE NIT STIFF (SHEATH) ×1 IMPLANT
PACK CARDIAC CATHETERIZATION (CUSTOM PROCEDURE TRAY) ×2 IMPLANT
SHEATH PINNACLE 5F 10CM (SHEATH) ×1 IMPLANT
SHEATH PINNACLE 6F 10CM (SHEATH) ×1 IMPLANT
SLED PULL BACK IVUS (MISCELLANEOUS) ×1 IMPLANT
STENT RESOLUTE ONYX 2.0X18 (Permanent Stent) ×1 IMPLANT
STENT RESOLUTE ONYX 3.5X12 (Permanent Stent) ×1 IMPLANT
TRANSDUCER W/STOPCOCK (MISCELLANEOUS) ×2 IMPLANT
TUBING CIL FLEX 10 FLL-RA (TUBING) ×2 IMPLANT
WIRE EMERALD 3MM-J .025X260CM (WIRE) IMPLANT
WIRE EMERALD 3MM-J .035X150CM (WIRE) ×1 IMPLANT
WIRE HI TORQ VERSACORE-J 145CM (WIRE) ×1 IMPLANT
WIRE MARVEL STR TIP 190CM (WIRE) ×1 IMPLANT

## 2018-07-21 NOTE — Progress Notes (Signed)
Pt arrived to the floor via Care link. Pt alert and oriented and ambulated to the room. He denies pain, CHG bath given, heart monitor applied and admitting MD notified via text page.

## 2018-07-21 NOTE — Interval H&P Note (Signed)
History and Physical Interval Note:  07/21/2018 2:32 PM  [name] [name]  has presented today for cardiac catheterization, with the diagnosis of NSTEMI. The various methods of treatment have been discussed with the patient and family. After consideration of risks, benefits and other options for treatment, the patient has consented to  Procedure(s): LEFT HEART CATH AND CORONARY ANGIOGRAPHY (N/A) as a surgical intervention .  The patient's history has been reviewed, patient examined, no change in status, stable for surgery.  I have reviewed the patient's chart and labs.  Questions were answered to the patient's satisfaction.    Cath Lab Visit (complete for each Cath Lab visit)  Clinical Evaluation Leading to the Procedure:   ACS: Yes.    Non-ACS:  N/A  Courtnay Petrilla

## 2018-07-21 NOTE — Plan of Care (Signed)
POC initiated and progressing. 

## 2018-07-21 NOTE — Progress Notes (Signed)
Site area: right groin  Site Prior to Removal:  Level 0  Pressure Applied For 20 MINUTES    Minutes Beginning at 1940  Manual:   Yes.    Patient Status During Pull:  WNL  Post Pull Groin Site:  Level 0  Post Pull Instructions Given:  Yes.    Post Pull Pulses Present:  Yes.    Dressing Applied:  Yes.

## 2018-07-21 NOTE — Progress Notes (Signed)
ANTICOAGULATION CONSULT NOTE - Follow Up  Pharmacy Consult for heparin Indication: chest pain/ACS  Allergies  Allergen Reactions  . Bee Venom Swelling  . Penicillins Itching    Did it involve swelling of the face/tongue/throat, SOB, or low BP? Yes-swelling Did it involve sudden or severe rash/hives, skin peeling, or any reaction on the inside of your mouth or nose? No-itching Did you need to seek medical attention at a hospital or doctor's office? Yes When did it last happen? Over 10 years  If all above answers are "NO", may proceed with cephalosporin use.   Marland Kitchen Plavix [Clopidogrel Bisulfate] Other (See Comments)    Patient is noted to be a nonresponder to Plavix    Patient Measurements: Height: 5\' 11"  (180.3 cm) Weight: 199 lb 4.7 oz (90.4 kg) IBW/kg (Calculated) : 75.3 HEPARIN DW (KG): 90.4   Vital Signs: Temp: 97.8 F (36.6 C) (02/03 0456) Temp Source: Oral (02/03 0456) BP: 130/73 (02/03 0802) Pulse Rate: 101 (02/03 0802)  Labs: Recent Labs    07/20/18 2127 07/21/18 0327 07/21/18 0808  HGB 13.6  --  13.3  HCT 43.1  --  40.1  PLT 201  --  204  APTT 27  --   --   LABPROT 13.1  --   --   INR 1.00  --   --   HEPARINUNFRC  --   --  0.15*  CREATININE 1.00 0.93  --   TROPONINI 0.08* 4.46*  --    Estimated Creatinine Clearance: 85 mL/min (by C-G formula based on SCr of 0.93 mg/dL).  Medical History: Past Medical History:  Diagnosis Date  . CAP (community acquired pneumonia) 10/11/2017  . Coronary artery disease    a. Cath: 2000 nonobs dz. b. 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 s/p BMS to LAD & RCA ; s/p PCI/DES dRCA & PDA x2 2011. d. cath 02/17/14: LM patent, LAD calcified, diffuse irregs, no high grade stenosis mLAD stent patent, LCx, diffuse irregs no high-grade dz, RCA total occ w/ L to R collats, medical Rx rec  . Diabetes mellitus   . Diastolic dysfunction    a. echo 02/18/14: EF 45-50%, mild LVH, AK of inferior and  inferoseptal myocardium, GR1DD  . Hiatal hernia   . Hyperlipidemia   . LV dysfunction   . NSTEMI (non-ST elevated myocardial infarction) (HCC) 10/07/2017  . PVD (peripheral vascular disease) (HCC)    ABIs of 0.69 on right and 0.93 on left  . Renal artery stenosis (HCC)    Mild, bilateral  . S/P angioplasty with stent, atherectomy DES to pLAD and DES to [facility] 10/10/17  10/11/2017  . Sleep apnea    Mild    Medications:  Infusions:  . sodium chloride    . heparin 1,500 Units/hr (07/21/18 0927)  . magnesium sulfate 1 - 4 g bolus IVPB    . nitroGLYCERIN 35 mcg/min (07/21/18 0032)    Assessment: Asked to dose heparin for 73 yo male that presented to ED with chest pain and SOB.  Tropionin 0.08> inc 4 possible cath today.Hx CAD PCI 4/20 on asa, ticagrelor PTA - continued.   Heparin drip 1200 uts/hr HL 0.15 < goal, no bleeding cbc stable    Goal of Therapy:  Heparin level 0.3-0.7 units/ml   Plan:  Increase heparin drip 1400 uts/hr Check HL in 6hr if not in cath lab Daily HL, CBC   Leota Sauers Pharm.D. CPP, BCPS Clinical Pharmacist [phone] 07/21/2018 9:38 AM

## 2018-07-21 NOTE — H&P (Signed)
History & Physical    Patient ID: [name] EmoryRudolph [name] MRN: [id number], DOB/AGE: 1945/10/08   Admit date: 07/20/2018  Primary Physician: Joette CatchingNyland, Leonard, MD Primary Cardiologist: Rollene RotundaJames Hochrein, MD  Patient Profile    73 year old man with multivessel CAD including chronically occluded but collateralized RCA and severely calcific LAD disease s/p most recent PCI with orbital atherectomy and stenting 09/2017, PVD, mild renal artery stenosis, mild OSA, type 2 DM, HFpEF, and hypertension, who presents with acute onset dyspnea associated with mild chest discomfort.  History of Present Illness  He took his dog out in the evening around 6-7pm and after spending some time getting the leash untangled from a post, became acutely short of breath. He sat down outside and was unable to walk without getting severely dyspneic. He reports only mild associated chest discomfort. He felt like "it was like when I had water in my lungs before". He tried to lay down but unable to due to significant orthopnea. He denies any lapse in medications. He does admit that earlier that earlier that day he had been eating pulled pork with a salty dry rub, which is out of the ordinary for him. Prior to this event he was in his normal state of health. He does have some exertional dyspnea at baseline which he has difficulty quantifying but states he "doesn't get around too good". He denies any leg or abdominal swelling. No known weight gain, though he doesn't monitor.   He initially went to [facility] where he was found to be hypertensive to 175/87 and hypoxic. CXR showed significant pulmonary edema. I was contacted for transfer for possible ACS due to a troponin of 0.08 and ECG showing significant anterolateral ST depression with borderline elevation in aVR. I was told he primarily presented for ongoing chest pain, though he denies this. He was given aspirin, Lasix, and started on heparin and nitro infusions. When he arrived here he felt  significantly better.  Past Medical History   Past Medical History:  Diagnosis Date  . CAP (community acquired pneumonia) 10/11/2017  . Coronary artery disease    a. Cath: 2000 nonobs dz. b. 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 s/p BMS to LAD & RCA ; s/p PCI/DES dRCA & PDA x2 2011. d. cath 02/17/14: LM patent, LAD calcified, diffuse irregs, no high grade stenosis mLAD stent patent, LCx, diffuse irregs no high-grade dz, RCA total occ w/ L to R collats, medical Rx rec  . Diabetes mellitus   . Diastolic dysfunction    a. echo 02/18/14: EF 45-50%, mild LVH, AK of inferior and inferoseptal myocardium, GR1DD  . Hiatal hernia   . Hyperlipidemia   . LV dysfunction   . NSTEMI (non-ST elevated myocardial infarction) (HCC) 10/07/2017  . PVD (peripheral vascular disease) (HCC)    ABIs of 0.69 on right and 0.93 on left  . Renal artery stenosis (HCC)    Mild, bilateral  . S/P angioplasty with stent, atherectomy DES to [facility] and [facility]  10/11/2017  . Sleep apnea    Mild    Past Surgical History:  Procedure Laterality Date  . CATARACT EXTRACTION    . CORONARY ATHERECTOMY N/A 10/10/2017   Procedure: CORONARY ATHERECTOMY - CSI;  Surgeon: Yvonne KendallEnd, Christopher, MD;  Location: [facility] INVASIVE CV LAB;  Service: Cardiovascular;  Laterality: N/A;  . CORONARY STENT INTERVENTION N/A 10/10/2017   Procedure: CORONARY STENT INTERVENTION;  Surgeon: Yvonne KendallEnd, Christopher, MD;  [facility];  Service: Cardiovascular;  Laterality: N/A;  . HIATAL HERNIA REPAIR    . INTRAVASCULAR ULTRASOUND/IVUS N/A 10/09/2017   Procedure: Intravascular Ultrasound/IVUS;  Surgeon: [name], MD;  Location: [facility] INVASIVE CV LAB;  Service: Cardiovascular;  Laterality: N/A;  . LEFT HEART CATH AND CORONARY ANGIOGRAPHY N/A 10/09/2017   Procedure: LEFT HEART CATH AND CORONARY ANGIOGRAPHY;  Surgeon: [name], MD;  Location: [facility] INVASIVE CV LAB;  Service: Cardiovascular;  Laterality: N/A;    . LEFT HEART CATHETERIZATION WITH CORONARY ANGIOGRAM N/A 02/17/2014   Procedure: LEFT HEART CATHETERIZATION WITH CORONARY ANGIOGRAM;  Surgeon: Micheline Chapman, MD;  Location: [facility] CATH LAB;  Service: Cardiovascular;  Laterality: N/A;  . SHOULDER ARTHROSCOPY     Right  . 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 SOFT TISSUE TUMOR RESECTION     Benign neck tumor  . Vocal cord polyp removal      Allergies  Allergies  Allergen Reactions  . Bee Venom Swelling  . Penicillins Itching    Did it involve swelling of the face/tongue/throat, SOB, or low BP? Yes-swelling Did it involve sudden or severe rash/hives, skin peeling, or any reaction on the inside of your mouth or nose? No-itching Did you need to seek medical attention at a hospital or doctor's office? Yes When did it last happen? Over 10 years  If all above answers are "NO", may proceed with cephalosporin use.   Marland Kitchen Plavix [Clopidogrel Bisulfate] Other (See Comments)    Patient is noted to be a nonresponder to Plavix    Home Medications    Prior to Admission medications   Medication Sig Start Date End Date Taking? Authorizing Provider  allopurinol (ZYLOPRIM) 100 MG tablet Take 100 mg by mouth every morning.    Yes [provider]  amLODipine (NORVASC) 10 MG tablet Take 1 tablet (10 mg total) by mouth daily. Patient taking differently: Take 10 mg by mouth every morning.  09/24/12  Yes Rollene Rotunda, MD  aspirin 81 MG tablet Take 81 mg by mouth every morning.    Yes [provider]  atorvastatin (LIPITOR) 40 MG tablet TAKE 1 TABLET BY MOUTH EVERY DAY Patient taking differently: Take 40 mg by mouth every evening.  05/20/18  Yes Rollene Rotunda, MD  fenofibrate (TRICOR) 145 MG tablet Take 1 tablet (145 mg total) by mouth daily. Patient taking differently: Take 145 mg by mouth every morning.  10/11/17  Yes Leone Brand, NP  hydrochlorothiazide (MICROZIDE) 12.5 MG capsule Take 12.5 mg by mouth  every morning.    Yes [provider]  insulin NPH-regular Human (NOVOLIN 70/30) (70-30) 100 UNIT/ML injection Inject 48 Units into the skin 2 (two) times daily.   Yes [provider]  isosorbide mononitrate (IMDUR) 120 MG 24 hr tablet TAKE 1 TABLET BY MOUTH EVERY DAY Patient taking differently: Take 120 mg by mouth every morning.  04/24/18  Yes Rollene Rotunda, MD  losartan (COZAAR) 100 MG tablet Take 100 mg by mouth every morning.    Yes [provider]  metFORMIN (GLUCOPHAGE) 500 MG tablet Take 1,000 mg by mouth 2 (two) times daily. 02/19/14  Yes Dunn, Raymon Mutton, PA-C  metoprolol tartrate (LOPRESSOR) 100 MG tablet Take 150 mg by mouth 2 (two) times daily.   Yes [provider]  nitroGLYCERIN (NITROSTAT) 0.4 MG SL tablet Place 1 tablet (0.4 mg total) under the tongue every 5 (five) minutes as needed for chest pain. 10/14/13  Yes Rollene Rotunda, MD  omeprazole (PRILOSEC) 20 MG capsule Take 20 mg by mouth every morning.    Yes [provider]  ticagrelor (BRILINTA) 90 MG TABS tablet Take 1 tablet (90 mg total) by mouth 2 (two) times daily. 01/23/18  Yes Rollene Rotunda, MD    Family History    Family History  Problem Relation Age of Onset  . Heart attack Father 58  . Cancer Mother   . Cancer Brother    He indicated that his mother is deceased. He indicated that his father is deceased. He indicated that his brother is deceased.   Social History    Social History   Socioeconomic History  . Marital status: Married    Spouse name: Not on file  . Number of children: 3  . Years of education: Not on file  . Highest education level: Not on file  Occupational History  . 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 Financial resource strain: Not on file  . Food insecurity:    Worry: Not on file    Inability: Not on file  . Transportation needs:    Medical: Not on file    Non-medical: Not on file  Tobacco  Use  . Smoking status: Former Smoker    Last attempt to quit: 06/19/2003    Years since quitting: 15.0  . Smokeless tobacco: Never Used  Substance and Sexual Activity  . Alcohol use: No  . Drug use: No  . Sexual activity: Not on file  Lifestyle  . Physical activity:    Days per week: Not on file    Minutes per session: Not on file  . Stress: Not on file  Relationships  . Social connections:    Talks on phone: Not on file    Gets together: Not on file    Attends religious service: Not on file    Active member of club or organization: Not on file    Attends meetings of clubs or organizations: Not on file    Relationship status: Not on file  . Intimate partner violence:    Fear of current or ex partner: Not on file    Emotionally abused: Not on file    Physically abused: Not on file    Forced sexual activity: Not on file  Other Topics Concern  . Not on file  Social History Narrative   Lives in [place] with his wife.     Review of Systems    General:  No chills, fever, night sweats or weight changes.  Cardiovascular:  No chest pain, dyspnea on exertion, edema, orthopnea, palpitations, paroxysmal nocturnal dyspnea. Dermatological: No rash, lesions/masses Respiratory: No cough, dyspnea Urologic: No hematuria, dysuria Abdominal:   No nausea, vomiting, diarrhea, bright red blood per rectum, melena, or hematemesis Neurologic:  No visual changes, wkns, changes in mental status. All other systems reviewed and are otherwise negative except as noted above.  Physical Exam    Blood pressure 132/78, pulse 94, temperature 97.8 F (36.6 C), temperature source Oral, resp. rate 12, height 5\' 11"  (1.803 m), weight 90.4 kg, SpO2 94 %.  General: Pleasant, NAD Neuro: Alert and conversant. No gross localizing deficits.  HEENT: Normal Lungs:  Basilar rales. Resp regular and unlabored. Heart: RRR no s3, s4, or murmurs. +JVD Abdomen: Soft, non-tender, non-distended, BS + x 4.  Extremities: No  clubbing, cyanosis or edema. Radials 2+ and equal bilaterally.  Labs  Troponin (Point of Care Test) No results for input(s): TROPIPOC in the last 72 hours. Recent Labs    07/20/18 2127 07/21/18 0327  TROPONINI 0.08* 4.46*   Lab Results  Component Value Date   WBC 10.1 07/20/2018   HGB 13.6 07/20/2018   HCT 43.1 07/20/2018   MCV 85.7 07/20/2018   PLT 201 07/20/2018    Recent Labs  Lab 07/20/18 2127 07/21/18 0327  NA 140 143  K 3.1* 3.5  CL 107 108  CO2 22 21*  BUN 20 26*  CREATININE 1.00 0.93  CALCIUM 8.3* 8.5*  PROT 6.5  --   BILITOT 0.3  --   ALKPHOS 53  --   ALT 22  --   AST 23  --   GLUCOSE 323* 275*   Lab Results  Component Value Date   CHOL 132 05/30/2010   HDL 24 (L) 05/30/2010   LDLCALC See Comment mg/dL 02/72/5366   TRIG 440 (H) 05/30/2010   Lab Results  Component Value Date   DDIMER 0.50 10/07/2017     Radiology Studies    Dg Chest Portable 1 View  Result Date: 07/20/2018 CLINICAL DATA:  Chest pressure and rattling in the chest while breathing. Hypertensive. EXAM: PORTABLE CHEST 1 VIEW COMPARISON:  11/04/2017 FINDINGS: Cardiac enlargement with pulmonary vascular congestion and diffuse alveolar and interstitial infiltration, likely edema. No blunting of costophrenic angles. No pneumothorax. Mediastinal contours appear intact. Calcification of the aorta. IMPRESSION: Cardiac enlargement with pulmonary vascular congestion and bilateral edema. Electronically Signed   By: Burman Nieves M.D.   On: 07/20/2018 21:25    ECG & Cardiac Imaging    ECG personally reviewed shows sinus rhythm with LVH and anterolateral ST depression up to 68mm and borderline ST elevation in aVR.   Assessment & Plan    1. Acute cardiogenic pulmonary edema:  Possibly secondary to dietary non-compliance and hypertensive emergency vs. NSTEMI. Improving with diuresis and nitro. He also has known renal artery stenosis, though previously mild - may be worth reassessing. -  Continue IV diuresis and nitro ggt - Home oral antihypertensives in the morning (changed metoprolol to Coreg) - Wean oxygen as tolerated - NPO for possible ischemic evaluation - Consider renal doppler vs angiography if goes for LHC  2. Possible NSTEMI vs demand ischemia; known multivessel CAD: Chronically occluded mid-distal RCA with collateral filling and severely calcific LAD disease with ISR s/p PCI with orbital atherectomy and stenting 09/2017. Mostly presenting with dyspnea, only minor chest pain, though ECG suggestive of obstructive prox LAD/LM disease and second troponin now up to 4. Currently chest pain-free.  - Trend troponin - Heparin ggt - Aspirin and ticagrelor - Atorvastatin 40mg  - Nitro ggt - NPO for possible LHC - On beta blocker and ARB - Complete TTE  3. Hypertension: - Controlled on nitro ggt - titrate off with diuresis and oral meds. - Start home losartan, Imdur, Amlodipine (all max dose) - Change metoprolol to Coreg 25mg  - Consider eval for RAS as above  4. Type 2 DM:  A1c in 7s. On high-dose 70/30 insulin at home. - NPH 12 units BID - Novolog 6 units TIDAC + SSI - Will likely need uptitrated once eating.  Signed, Clerance Lav, NP 07/21/2018, 5:18 AM

## 2018-07-21 NOTE — Brief Op Note (Signed)
BRIEF CARDIAC CATHETERIZATION NOTE  07/21/2018  4:51 PM  PATIENT:  [name]  73 y.o. male  PRE-OPERATIVE DIAGNOSIS:  elevated troponins  POST-OPERATIVE DIAGNOSIS:  * No post-op diagnosis entered *  PROCEDURE:  Procedure(s): LEFT HEART CATH AND CORONARY ANGIOGRAPHY (N/A) CORONARY STENT INTERVENTION (N/A) Intravascular Ultrasound/IVUS (N/A)  SURGEON:  Surgeon(s) and Role:    * Clay Solum, MD - Primary  FINDINGS: 1. 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 2. Successful PCI to mid/distal LAD using Resolute Onyx 2.0 x 18 mm DES (post-dilated to 2.6 mm). 3. 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  RECOMMENDATIONS: 1. Remove right femoral artery sheath with manual compression in 2 hours. 2. DAPT with ASA and ticagrelor for at least 12 months, ideally lifelong. 3. Aggressive secondary prevention.  Yvonne Kendall, MD [facility] Pager: [phone]

## 2018-07-21 NOTE — H&P (View-Only) (Signed)
    Pt seen after fellow overnight. Denies chest pain this morning. Continues on Hep and NTG gtt. VSS. Anticiptate cath today for significant hx of CAD s/p orbital arthrectomy 09/2017, elevated trop at 0.08>4.46 and pulmonary edema on presentation. Repeat echo pending. Attending MD to see.   Georgie Chard NP-C [facility] Pager: [phone]  Patient seen and examined. Agree with assessment and plan. Pt admitted by fellow this am. I have reviewed his note. Pt is currently pain free but troponin has increased to 4.46 c/w NSTEMI. He has an occluded RCA and is s/p orbital atherectomy to proximal LAD and PCI to mid LAD in stent restenosis in 09/2017.  With + troponin, will add on to cath lab for today. I have reviewed the risks, indications, and alternatives to cardiac catheterization, possible angioplasty, and stenting with the patient. Risks include but are not limited to bleeding, infection, vascular injury, stroke, myocardial infection, arrhythmia, kidney injury, radiation-related injury in the case of prolonged fluoroscopy use, emergency cardiac surgery, and death. The patient understands the risks of serious complication is 1-2 in 1000 with diagnostic cardiac cath and 1-2% or less with angioplasty/stenting.    Lennette Bihari, MD, [facility] 07/21/2018 10:45 AM

## 2018-07-21 NOTE — Progress Notes (Addendum)
    Pt seen after fellow overnight. Denies chest pain this morning. Continues on Hep and NTG gtt. VSS. Anticiptate cath today for significant hx of CAD s/p orbital arthrectomy 09/2017, elevated trop at 0.08>4.46 and pulmonary edema on presentation. Repeat echo pending. Attending MD to see.   Jill McDaniel NP-C [facility] Pager: [phone]  Patient seen and examined. Agree with assessment and plan. Pt admitted by fellow this am. I have reviewed his note. Pt is currently pain free but troponin has increased to 4.46 c/w NSTEMI. He has an occluded RCA and is s/p orbital atherectomy to proximal LAD and PCI to mid LAD in stent restenosis in 09/2017.  With + troponin, will add on to cath lab for today. I have reviewed the risks, indications, and alternatives to cardiac catheterization, possible angioplasty, and stenting with the patient. Risks include but are not limited to bleeding, infection, vascular injury, stroke, myocardial infection, arrhythmia, kidney injury, radiation-related injury in the case of prolonged fluoroscopy use, emergency cardiac surgery, and death. The patient understands the risks of serious complication is 1-2 in 1000 with diagnostic cardiac cath and 1-2% or less with angioplasty/stenting.    Thomas A. Kelly, MD, FACC 07/21/2018 10:45 AM  

## 2018-07-22 ENCOUNTER — Telehealth: Payer: Self-pay | Admitting: Physician Assistant

## 2018-07-22 ENCOUNTER — Encounter (HOSPITAL_COMMUNITY): Payer: Self-pay | Admitting: Internal Medicine

## 2018-07-22 DIAGNOSIS — I501 Left ventricular failure: Secondary | ICD-10-CM

## 2018-07-22 LAB — BASIC METABOLIC PANEL
Anion gap: 8 (ref 5–15)
BUN: 24 mg/dL — ABNORMAL HIGH (ref 8–23)
CO2: 23 mmol/L (ref 22–32)
Calcium: 8.2 mg/dL — ABNORMAL LOW (ref 8.9–10.3)
Chloride: 112 mmol/L — ABNORMAL HIGH (ref 98–111)
Creatinine, Ser: 0.85 mg/dL (ref 0.61–1.24)
GFR calc Af Amer: >60 mL/min (ref >60)
GFR calc non Af Amer: >60 mL/min (ref >60)
Glucose, Bld: 177 mg/dL — ABNORMAL HIGH (ref 70–99)
Potassium: 3.4 mmol/L — ABNORMAL LOW (ref 3.5–5.1)
Sodium: 143 mmol/L (ref 135–145)

## 2018-07-22 LAB — GLUCOSE, CAPILLARY
Glucose-Capillary: 156 mg/dL — ABNORMAL HIGH (ref 70–99)
Glucose-Capillary: 186 mg/dL — ABNORMAL HIGH (ref 70–99)

## 2018-07-22 LAB — MAGNESIUM: Magnesium: 2.3 mg/dL (ref 1.7–2.4)

## 2018-07-22 LAB — CBC
HCT: 37.1 % — ABNORMAL LOW (ref 39.0–52.0)
Hemoglobin: 11.7 g/dL — ABNORMAL LOW (ref 13.0–17.0)
MCH: 27 pg (ref 26.0–34.0)
MCHC: 31.5 g/dL (ref 30.0–36.0)
MCV: 85.7 fL (ref 80.0–100.0)
Platelets: 183 10*3/uL (ref 150–400)
RBC: 4.33 MIL/uL (ref 4.22–5.81)
RDW: 14 % (ref 11.5–15.5)
WBC: 9.5 10*3/uL (ref 4.0–10.5)
nRBC: 0 % (ref 0.0–0.2)

## 2018-07-22 MED ORDER — ATORVASTATIN CALCIUM 80 MG PO TABS: 80.0000 mg | ORAL_TABLET | Freq: Every day | ORAL | 6 refills | Status: DC

## 2018-07-22 MED ORDER — CARVEDILOL 25 MG PO TABS: 25.0000 mg | ORAL_TABLET | Freq: Two times a day (BID) | ORAL | 6 refills | Status: DC

## 2018-07-22 MED FILL — CARVEDILOL 25 MG TABLET: 25 | 30 days supply | Qty: 60 | Fill #0 | Status: TO

## 2018-07-22 MED FILL — ATORVASTATIN CALCIUM 80 MG: 80 | 30 days supply | Qty: 30 | Fill #0 | Status: TO

## 2018-07-22 NOTE — Telephone Encounter (Signed)
TOC Patient-   Please call Patient-   Pt has an appointment with Theodore Demark on 07-29-18.

## 2018-07-22 NOTE — Care Management Note (Signed)
Case Management Note  Patient Details  Name: [name] [name] MRN: [id number] Date of Birth: 01/14/1946  Subjective/Objective:  From home with wife, pta indep, NCM spoke with patient about CHF [facility] for disease management, he states he does not want HH services , he does not believe he needs this.  He was on brilinta pta.                 Action/Plan: DC home when ready.  Expected Discharge Date:  07/24/18               Expected Discharge Plan:  Home/Self Care  In-House Referral:     Discharge planning Services  CM Consult  Post Acute Care Choice:    Choice offered to:     DME Arranged:    DME Agency:     HH Arranged:    HH Agency:     Status of Service:  Completed, signed off  If discussed at [employer] of Stay Meetings, dates discussed:    Additional Comments:  Leone Haven, RN 07/22/2018, 10:10 AM

## 2018-07-22 NOTE — Progress Notes (Signed)
CARDIAC REHAB PHASE I   PRE:  Rate/Rhythm: 70 SR    BP: sitting 147/70    SaO2:   MODE:  Ambulation: 400 ft   POST:  Rate/Rhythm: 92 SR    BP: sitting 155/68     SaO2:   Tolerated well, no major c/o. Has left side pain that he thinks is from laying so much the last two days. Ed completed. Understands importance of Brilinta twice a day. Will refer to [facility] however it is 40 min from his house. Encouraged increased ambulation.  [phone]   Harriet Masson CES, ACSM 07/22/2018 9:01 AM

## 2018-07-22 NOTE — Telephone Encounter (Signed)
Patient currently admitted. Attempt 07/23/2018

## 2018-07-22 NOTE — Progress Notes (Addendum)
Progress Note  [name] Date of Encounter: 07/22/2018  Primary Cardiologist: Rollene Rotunda, MD   Subjective   Feeling well. No chest pain, sob or palpitations.   Inpatient Medications    Scheduled Meds: . allopurinol  100 mg Oral q morning - 10a  . amLODipine  10 mg Oral Daily  . aspirin EC  81 mg Oral q morning - 10a  . atorvastatin  40 mg Oral QPM  . carvedilol  25 mg Oral BID WC  . enoxaparin (LOVENOX) injection  40 mg Subcutaneous Q24H  . fenofibrate  160 mg Oral Daily  . insulin aspart  0-15 Units Subcutaneous TID WC  . insulin aspart  6 Units Subcutaneous TID WC  . insulin NPH Human  12 Units Subcutaneous BID AC & HS  . losartan  100 mg Oral q morning - 10a  . pantoprazole  40 mg Oral Daily  . sodium chloride flush  3 mL Intravenous Q12H  . sodium chloride flush  3 mL Intravenous Q12H  . ticagrelor  90 mg Oral BID   Continuous Infusions: . sodium chloride    . sodium chloride     PRN Meds: sodium chloride, sodium chloride, acetaminophen, ondansetron (ZOFRAN) IV, sodium chloride flush, sodium chloride flush   Vital Signs    Vitals:   07/21/18 2045 07/21/18 2100 07/21/18 2235 07/22/18 0103  BP: 138/65 (!) 130/108 131/76 122/67  Pulse: 82 86 90 80  Resp: 16 10 13 18   Temp:    (!) 97.5 F (36.4 C)  TempSrc:    Oral  SpO2: 96% 93% 93% 96%  Weight:    88.7 kg  Height:        Intake/Output Summary (Last 24 hours) at 07/22/2018 0741 Last data filed at 07/22/2018 0645 Gross per 24 hour  Intake 730 ml  Output 2250 ml  Net -1520 ml   Last 3 Weights 07/22/2018 07/21/2018 07/20/2018  Weight (lbs) 195 lb 8.8 oz 199 lb 4.7 oz 200 lb  Weight (kg) 88.7 kg 90.4 kg 90.719 kg      Telemetry    SR at 70s - Personally Reviewed  ECG    SR with lateral ST/T changes  - Personally Reviewed  Physical Exam   GEN: No acute distress.   Neck: No JVD Cardiac: RRR, no murmurs, rubs, or gallops. Right groin cath site without hematoma.  Respiratory: Clear  to auscultation bilaterally.  GI: Soft, nontender, non-distended  MS: No edema; No deformity. Neuro:  Nonfocal  Psych: Normal affect   Labs    Chemistry Recent Labs  Lab 07/20/18 2127 07/21/18 0327 07/22/18 0535  NA 140 143 143  K 3.1* 3.5 3.4*  CL 107 108 112*  CO2 22 21* 23  GLUCOSE 323* 275* 177*  BUN 20 26* 24*  CREATININE 1.00 0.93 0.85  CALCIUM 8.3* 8.5* 8.2*  PROT 6.5  --   --   ALBUMIN 3.7  --   --   AST 23  --   --   ALT 22  --   --   ALKPHOS 53  --   --   BILITOT 0.3  --   --   GFRNONAA >60 >60 >60  GFRAA >60 >60 >60  ANIONGAP 11 14 8      Hematology Recent Labs  Lab 07/20/18 2127 07/21/18 0808 07/22/18 0535  WBC 10.1 11.2* 9.5  RBC 5.03 4.80 4.33  HGB 13.6 13.3 11.7*  HCT 43.1 40.1 37.1*  MCV 85.7 83.5 85.7  MCH 27.0 27.7 27.0  MCHC 31.6 33.2 31.5  RDW 13.8 13.8 14.0  PLT 201 204 183    Cardiac Enzymes Recent Labs  Lab 07/20/18 2127 07/21/18 0327 07/21/18 0949  TROPONINI 0.08* 4.46* 6.00*   No results for input(s): TROPIPOC in the last 168 hours.   BNP Recent Labs  Lab 07/20/18 2127  BNP 116.0*     DDimer No results for input(s): DDIMER in the last 168 hours.   Radiology    Dg Chest Portable 1 View  Result Date: 07/20/2018 CLINICAL DATA:  Chest pressure and rattling in the chest while breathing. Hypertensive. EXAM: PORTABLE CHEST 1 VIEW COMPARISON:  11/04/2017 FINDINGS: Cardiac enlargement with pulmonary vascular congestion and diffuse alveolar and interstitial infiltration, likely edema. No blunting of costophrenic angles. No pneumothorax. Mediastinal contours appear intact. Calcification of the aorta. IMPRESSION: Cardiac enlargement with pulmonary vascular congestion and bilateral edema. Electronically Signed   By: Burman Nieves M.D.   On: 07/20/2018 21:25    Cardiac Studies   CORONARY STENT INTERVENTION  Intravascular Ultrasound/IVUS  LEFT HEART CATH AND CORONARY ANGIOGRAPHY  Conclusion   Conclusions: 1. 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 2. Successful PCI to mid/distal LAD using Resolute Onyx 2.0 x 18 mm DES (post-dilated to 2.6 mm). 3. 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  Recommendations: 1. Remove right femoral artery sheath with manual compression in 2 hours. 2. Dual antiplatelet therapy with aspirin and ticagrelor for at least 12 months, ideally lifelong. 3. Aggressive secondary prevention.  If restenosis of LAD stents occurs, cardiac surgery consultation for CABG would need to be considered.   Diagnostic  Dominance: Right    Intervention      Patient Profile     with multivessel CAD including chronically occluded but collateralized RCA and severely calcific LAD disease s/p most recent PCI with orbital atherectomy and stenting 09/2017, PVD, mild renal artery stenosis, mild OSA, type 2 DM, HFpEF, and hypertension, who presents with acute onset dyspnea associated with mild chest discomfort who ruled in for NSTEMI.  Assessment & Plan    1. NSTEMI - Troponin peaked at 6.0. Cath as above. S/p successful PCI to mid/distal LAD using Resolute Onyx 2.0 x 18 mm DES and successful IVUS-guided PCI to ostial LAD (arises from aorta, given anomalous LCx) using Resolute Onyx 3.5 x 12 mm DES. - No recurrent symptoms.  - ASA, Brillinta, statin and BB. ? Restart Imdur.   2. HLD - No results found for requested labs within last 8760 hours.  - will increase Lipitor to 80mg  daily. Check labs in few weeks.   3. HTN - BP stable on current medications. Metoprolol changed to coreg this admission.   4. Dyspnea - patient reports intermittent dyspnea since last PCI 09/2017. He is on Brillinta however symptoms not typically occurs  after taking pills. Mostly occurs at night. LVEDP  11. He is euvolemic. Will review with MD.    Ambulate. If does well, likely discharge later today.   For questions or updates, please contact [facility] Please consult [email or web address] for contact info under        SignedManson Passey, PA  07/22/2018, 7:41 AM    Patient seen and examined. Agree with assessment  and plan. Feels well post cath/PCI to mid LAD stenosis near distal edge of old mid LAD stent and near ostial stenosis. Would resume Imdur with concomitant CAD and total RCA occlusion with collateral. R groin cath site stable.  OK for DC today; f/u Dr. Antoine Poche.   Lennette Bihari, MD, [facility] 07/22/2018 10:40 AM

## 2018-07-22 NOTE — Discharge Summary (Addendum)
Discharge Summary    Patient ID: [name] EmoryRudolph Dom MRN: [id number]; DOB: 1946-04-12  Admit date: 07/20/2018 Discharge date: 07/22/2018  Primary Care Provider: Joette CatchingNyland, Leonard, MD  Primary Cardiologist: Rollene RotundaJames Hochrein, MD   Discharge Diagnoses    Principal Problem:   Acute cardiogenic pulmonary edema [facility](HCC) Active Problems:   Type 2 diabetes mellitus without complication (HCC)   Coronary atherosclerosis   PVD   NSTEMI (non-ST elevated myocardial infarction) [facility](HCC)   Essential hypertension   Elevated troponin level not due to acute coronary syndrome   Allergies Allergies  Allergen Reactions  . Bee Venom Swelling  . Penicillins Itching    Did it involve swelling of the face/tongue/throat, SOB, or low BP? Yes-swelling Did it involve sudden or severe rash/hives, skin peeling, or any reaction on the inside of your mouth or nose? No-itching Did you need to seek medical attention at a hospital or doctor's office? Yes When did it last happen? Over 10 years  If all above answers are "NO", may proceed with cephalosporin use.   Marland Kitchen. Plavix [Clopidogrel Bisulfate] Other (See Comments)    Patient is noted to be a nonresponder to Plavix    Diagnostic Studies/Procedures    CORONARY STENT INTERVENTION  Intravascular Ultrasound/IVUS  LEFT HEART CATH AND CORONARY ANGIOGRAPHY  Conclusion   Conclusions: 1. 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 2. Successful PCI to mid/distal LAD using Resolute Onyx 2.0 x 18 mm DES (post-dilated to 2.6 mm). 3. 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  Recommendations: 1. Remove right femoral artery sheath with manual compression in 2 hours. 2. Dual antiplatelet therapy with  aspirin and ticagrelor for at least 12 months, ideally lifelong. 3. Aggressive secondary prevention. If restenosis of LAD stents occurs, cardiac surgery consultation for CABG would need to be considered.   Diagnostic  Dominance: Right    Intervention        History of Present Illness     73 yr old male with multivessel CAD including chronically occluded but collateralized RCA and severely calcific LAD disease s/p most recent PCI with orbital atherectomy and stenting 09/2017, PVD, mild renal artery stenosis, mild OSA, type 2 DM, HFpEF, and hypertension, who presents with acute onset dyspnea associated with mild chest discomfort who ruled in for NSTEMI.   Hospital Course     Consultants: None  1. NSTEMI - Troponin peaked at 6.0. Cath as above. S/p successful PCI to mid/distal LAD using Resolute Onyx 2.0 x 18 mm DES and successful IVUS-guided PCI to ostial LAD (arises from aorta, given anomalous LCx) using Resolute Onyx 3.5 x 12 mm DES. - No recurrent symptoms.  - ASA, Brillinta, statin and BB. Resume home Imdur with concomitant CAD and total RCA occlusion with collateral.  2. HLD - No results found for requested labs within last 8760 hours.  - will increase Lipitor to 80mg  daily at discharge. Check labs in few weeks.   3. HTN - BP stable on current medications. Metoprolol changed to coreg this admission. Continue home HCTZ.   4. Dyspnea - patient reports intermittent dyspnea since last PCI 09/2017. He is on Brillinta however symptoms not typically occurs after taking pills. Mostly occurs at night. LVEDP  11. He is euvolemic.  - Follow closely  as outpatient.  - The left ventricular ejection fraction is 45-50% by visual estimate by cath. Consider echo as outpatient.   Discharge Vitals Blood pressure (!) 158/71, pulse 63, temperature (!) 97.4 F (36.3 C), temperature source Oral, resp. rate 13, height 5\' 11"  (1.803 m), weight 88.7 kg, SpO2 96 %.  Filed Weights   07/20/18 2107  07/21/18 0155 07/22/18 0103  Weight: 90.7 kg 90.4 kg 88.7 kg    Labs & Radiologic Studies    CBC Recent Labs    07/20/18 2127 07/21/18 0808 07/22/18 0535  WBC 10.1 11.2* 9.5  NEUTROABS 6.5  --   --   HGB 13.6 13.3 11.7*  HCT 43.1 40.1 37.1*  MCV 85.7 83.5 85.7  PLT 201 204 183   Basic Metabolic Panel Recent Labs    40/98/1100/09/04 0327 07/22/18 0535  NA 143 143  K 3.5 3.4*  CL 108 112*  CO2 21* 23  GLUCOSE 275* 177*  BUN 26* 24*  CREATININE 0.93 0.85  CALCIUM 8.5* 8.2*  MG 1.4* 2.3   Liver Function Tests Recent Labs    07/20/18 2127  AST 23  ALT 22  ALKPHOS 53  BILITOT 0.3  PROT 6.5  ALBUMIN 3.7   No results for input(s): LIPASE, AMYLASE in the last 72 hours. Cardiac Enzymes Recent Labs    07/20/18 2127 07/21/18 0327 07/21/18 0949  TROPONINI 0.08* 4.46* 6.00*  _____________  Dg Chest Portable 1 View  Result Date: 07/20/2018 CLINICAL DATA:  Chest pressure and rattling in the chest while breathing. Hypertensive. EXAM: PORTABLE CHEST 1 VIEW COMPARISON:  11/04/2017 FINDINGS: Cardiac enlargement with pulmonary vascular congestion and diffuse alveolar and interstitial infiltration, likely edema. No blunting of costophrenic angles. No pneumothorax. Mediastinal contours appear intact. Calcification of the aorta. IMPRESSION: Cardiac enlargement with pulmonary vascular congestion and bilateral edema. Electronically Signed   By: Burman NievesWilliam  Stevens M.D.   On: 07/20/2018 21:25   Disposition   Pt is being discharged home today in good condition.  Follow-up Plans & Appointments    Follow-up Information    Barrett, Joline SaltRhonda G, PA-C. Go on 07/29/2018.   Specialties:  Cardiology, Radiology Why:  @9am  for hospital follow up, please arrive 15 minutes early  Contact information: [address] [place] [id number] [phone]          Discharge Instructions    Amb Referral to Cardiac Rehabilitation   Complete by:  As directed    Diagnosis:   Coronary  Stents PTCA NSTEMI     Diet - low sodium heart healthy   Complete by:  As directed    Discharge instructions   Complete by:  As directed    NO HEAVY LIFTING (>10lbs) X 2 WEEKS. NO SEXUAL ACTIVITY X 2 WEEKS. NO DRIVING X 1 WEEK. NO SOAKING BATHS, HOT TUBS, POOLS, ETC., X 7 DAYS.  Hold metformin today. Resume tomorrow.   Increase activity slowly   Complete by:  As directed       Discharge Medications   Allergies as of 07/22/2018      Reactions   Bee Venom Swelling   Penicillins Itching   Did it involve swelling of the face/tongue/throat, SOB, or low BP? Yes-swelling Did it involve sudden or severe rash/hives, skin peeling, or any reaction on the inside of your mouth or nose? No-itching Did you need to seek medical attention at a hospital or doctor's office? Yes When did it last happen? Over 10 years  If all above answers are "NO", may  proceed with cephalosporin use.   Plavix [clopidogrel Bisulfate] Other (See Comments)   Patient is noted to be a nonresponder to Plavix      Medication List    TAKE these medications   allopurinol 100 MG tablet Commonly known as:  ZYLOPRIM Take 100 mg by mouth every morning.   amLODipine 10 MG tablet Commonly known as:  NORVASC Take 1 tablet (10 mg total) by mouth daily. What changed:  when to take this   aspirin 81 MG tablet Take 81 mg by mouth every morning.   atorvastatin 80 MG tablet Commonly known as:  LIPITOR Take 1 tablet (80 mg total) by mouth daily. What changed:    medication strength  how much to take   carvedilol 25 MG tablet Commonly known as:  COREG Take 1 tablet (25 mg total) by mouth 2 (two) times daily with a meal.   fenofibrate 145 MG tablet Commonly known as:  TRICOR Take 1 tablet (145 mg total) by mouth daily. What changed:  when to take this   hydrochlorothiazide 12.5 MG capsule Commonly known as:  MICROZIDE Take 12.5 mg by mouth every morning.   insulin NPH-regular Human (70-30) 100 UNIT/ML  injection Inject 48 Units into the skin 2 (two) times daily.   isosorbide mononitrate 120 MG 24 hr tablet Commonly known as:  IMDUR TAKE 1 TABLET BY MOUTH EVERY DAY What changed:  when to take this   losartan 100 MG tablet Commonly known as:  COZAAR Take 100 mg by mouth every morning.   metFORMIN 500 MG tablet Commonly known as:  GLUCOPHAGE Take 1,000 mg by mouth 2 (two) times daily.   metoprolol tartrate 100 MG tablet Commonly known as:  LOPRESSOR Take 150 mg by mouth 2 (two) times daily.   nitroGLYCERIN 0.4 MG SL tablet Commonly known as:  NITROSTAT Place 1 tablet (0.4 mg total) under the tongue every 5 (five) minutes as needed for chest pain.   omeprazole 20 MG capsule Commonly known as:  PRILOSEC Take 20 mg by mouth every morning.   ticagrelor 90 MG Tabs tablet Commonly known as:  BRILINTA Take 1 tablet (90 mg total) by mouth 2 (two) times daily.        Acute coronary syndrome (MI, NSTEMI, STEMI, etc) this admission?: Yes.     AHA/ACC Clinical Performance & Quality Measures: 1. Aspirin prescribed? - Yes 2. ADP Receptor Inhibitor (Plavix/Clopidogrel, Brilinta/Ticagrelor or Effient/Prasugrel) prescribed (includes medically managed patients)? - Yes 3. Beta Blocker prescribed? - Yes 4. High Intensity Statin (Lipitor 40-80mg  or Crestor 20-40mg ) prescribed? - Yes 5. EF assessed during THIS hospitalization? - No - will plan echo as outpatient  6. For EF <40%, was ACEI/ARB prescribed? - Yes 7. For EF <40%, Aldosterone Antagonist (Spironolactone or Eplerenone) prescribed? - Not Applicable (EF >/= 40%) by cath  8. Cardiac Rehab Phase II ordered (Included Medically managed Patients)? - Yes     Outstanding Labs/Studies   Lipid panel and LFTS in 6-8 weeks.   Duration of Discharge Encounter   Greater than 30 minutes including physician time.  Lorelei Pont, PA 07/22/2018, 1:06 PM

## 2018-07-23 NOTE — Telephone Encounter (Signed)
Patient contacted regarding discharge from [facility] on 07/22/2018.  Patient understands to follow up with provider Theodore Demark, PA-C on 07/29/2018 at 9:00 am at [facility] office. Patient understands discharge instructions? [name] [name] Patient understands medications and regiment? Yes Patient understands to bring all medications to this visit? Yes

## 2018-07-29 ENCOUNTER — Encounter: Payer: Self-pay | Admitting: Physician Assistant

## 2018-07-29 ENCOUNTER — Ambulatory Visit (INDEPENDENT_AMBULATORY_CARE_PROVIDER_SITE_OTHER): Payer: Medicare Other | Admitting: Physician Assistant

## 2018-07-29 ENCOUNTER — Ambulatory Visit (HOSPITAL_COMMUNITY)
Admission: RE | Admit: 2018-07-29 | Discharge: 2018-07-29 | Disposition: A | Discharge status: Home / Self Care | Payer: Medicare Other | Source: Ambulatory Visit | Attending: Cardiology | Admitting: Cardiology

## 2018-07-29 VITALS — BP 164/78 | HR 69 | Ht 71.0 in | Wt 200.6 lb

## 2018-07-29 DIAGNOSIS — I739 Peripheral vascular disease, unspecified: Secondary | ICD-10-CM

## 2018-07-29 DIAGNOSIS — I214 Non-ST elevation (NSTEMI) myocardial infarction: Secondary | ICD-10-CM | POA: Diagnosis not present

## 2018-07-29 DIAGNOSIS — E119 Type 2 diabetes mellitus without complications: Secondary | ICD-10-CM | POA: Diagnosis not present

## 2018-07-29 DIAGNOSIS — I5043 Acute on chronic combined systolic (congestive) and diastolic (congestive) heart failure: Secondary | ICD-10-CM

## 2018-07-29 LAB — BASIC METABOLIC PANEL
BUN/Creatinine Ratio: 24 (ref 10–24)
BUN: 22 mg/dL (ref 8–27)
CO2: 21 mmol/L (ref 20–29)
Calcium: 9.8 mg/dL (ref 8.6–10.2)
Chloride: 103 mmol/L (ref 96–106)
Creatinine, Ser: 0.9 mg/dL (ref 0.76–1.27)
GFR calc Af Amer: 100 mL/min/{1.73_m2} (ref >59)
GFR, EST NON AFRICAN AMERICAN: 86 mL/min/{1.73_m2} (ref >59)
Glucose: 106 mg/dL — ABNORMAL HIGH (ref 65–99)
Potassium: 4.3 mmol/L (ref 3.5–5.2)
Sodium: 142 mmol/L (ref 134–144)

## 2018-07-29 NOTE — Progress Notes (Signed)
Cardiology Office Note   Date:  07/29/2018   ID:  [name] EmoryRudolph Michon, DOB 1945/10/28, MRN [id number]  PCP:  Joette CatchingNyland, Leonard, MD Cardiologist:  Rollene RotundaJames Hochrein, MD 01/23/2018 Theodore Demarkhonda Marri Mcneff, PA-C   No chief complaint on file.   History of Present Illness: [name] [name] is a 73 y.o. male with a history of multivessel CAD including chronically occluded but collateralized RCA and severely calcific LAD disease s/p most recent PCI with orbital atherectomy and stenting 09/2017, PVD, mild renal artery stenosis, mild OSA, type 2 DM, HFpEF, HTN, HLD  Admitted 2/2-07/22/2018 with pulmonary edema, non-STEMI with DES LAD, Lipitor increased to 80 mg, on aspirin and Brilinta, EF 45-50% at cath.  [name] Emoryudolph Aina presents for cardiology follow up.  He has not been weighing at home, but can start. Has not been waking w/ LE edema.   His breathing is still poor with exertion. He can walk a block or so, but his calves will start hurting and he has to stop. He does ok if the intensity level is kept low, but does not do well with more strenuous activity.   He has not been reading labels, but is trying not to add salt.   Admits that his A1c will be high if he drinks sodas and eats bread.  It improves with diet compliance.  No chest pain since d/c.   His BP is not this high at home, feels he is a little nervous being here and has not had am meds yet.   He has been having some diarrhea since d/c. However, his metformin was increased from 500 mg bid>>1000 mg bid just prior to admission.    Past Medical History:  Diagnosis Date  . CAP (community acquired pneumonia) 10/11/2017  . Coronary artery disease    a. Cath: 2000 nonobs dz. b. 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 s/p BMS to LAD & RCA ; s/p PCI/DES dRCA & PDA x2 2011. d. cath 02/17/14: LM patent, LAD calcified, diffuse irregs, no high grade stenosis mLAD stent patent, LCx, diffuse irregs no high-grade dz, RCA total occ w/  L to R collats, medical Rx rec  . Diabetes mellitus   . Diastolic dysfunction    a. echo 02/18/14: EF 45-50%, mild LVH, AK of inferior and inferoseptal myocardium, GR1DD  . Hiatal hernia   . Hyperlipidemia   . LV dysfunction   . NSTEMI (non-ST elevated myocardial infarction) (HCC) 10/07/2017  . PVD (peripheral vascular disease) (HCC)    ABIs of 0.69 on right and 0.93 on left  . Renal artery stenosis (HCC)    Mild, bilateral  . S/P angioplasty with stent, [facility]herectomy [facility]  10/11/2017  . Sleep apnea    Mild    Past Surgical History:  Procedure Laterality Date  . CATARACT EXTRACTION    . CORONARY ATHERECTOMY N/A 10/10/2017   Procedure: CORONARY ATHERECTOMY - CSI;  Surgeon: Yvonne KendallEnd, Christopher, MD;  Location: [facility];  Service: Cardiovascular;  Laterality: N/A;  . CORONARY STENT INTERVENTION N/A 10/10/2017   Procedure: CORONARY STENT INTERVENTION;  Surgeon: Yvonne KendallEnd, Christopher, MD;  Location: [facility];  Service: Cardiovascular;  Laterality: N/A;  . CORONARY STENT INTERVENTION N/A 07/21/2018   Procedure: CORONARY STENT INTERVENTION;  Surgeon: Yvonne KendallEnd, Christopher, MD;  Location: [facility];  Service: Cardiovascular;  Laterality: N/A;  . HIATAL HERNIA REPAIR    . INTRAVASCULAR ULTRASOUND/IVUS N/A 10/09/2017  Procedure: Intravascular Ultrasound/IVUS;  Surgeon: Marykay LexHarding, David W, MD;  [facility];  Service: Cardiovascular;  Laterality: N/A;  . INTRAVASCULAR ULTRASOUND/IVUS N/A 07/21/2018   Procedure: Intravascular Ultrasound/IVUS;  Surgeon: Yvonne KendallEnd, Christopher, MD;  Location: [facility];  Service: Cardiovascular;  Laterality: N/A;  . LEFT HEART CATH AND CORONARY ANGIOGRAPHY N/A 10/09/2017   Procedure: LEFT HEART CATH AND CORONARY ANGIOGRAPHY;  Surgeon: Marykay LexHarding, David W, MD;  [facility];  Service: Cardiovascular;  Laterality: N/A;  . LEFT HEART CATH AND CORONARY ANGIOGRAPHY N/A 07/21/2018   Procedure: LEFT HEART  CATH AND CORONARY ANGIOGRAPHY;  Surgeon: Yvonne KendallEnd, Christopher, MD;  Location: [facility];  Service: Cardiovascular;  Laterality: N/A;  . LEFT HEART CATHETERIZATION WITH CORONARY ANGIOGRAM N/A 02/17/2014   Procedure: LEFT HEART CATHETERIZATION WITH CORONARY ANGIOGRAM;  Surgeon: Micheline ChapmanMichael D Cooper, MD;  [facility];  Service: Cardiovascular;  Laterality: N/A;  . SHOULDER ARTHROSCOPY     Right  . 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 SOFT TISSUE TUMOR RESECTION     Benign neck tumor  . Vocal cord polyp removal      Current Outpatient Medications  Medication Sig Dispense Refill  . allopurinol (ZYLOPRIM) 100 MG tablet Take 100 mg by mouth every morning.     Marland Kitchen. amLODipine (NORVASC) 10 MG tablet Take 1 tablet (10 mg total) by mouth daily. (Patient taking differently: Take 10 mg by mouth every morning. ) 90 tablet 3  . aspirin 81 MG tablet Take 81 mg by mouth every morning.     Marland Kitchen. atorvastatin (LIPITOR) 80 MG tablet Take 1 tablet (80 mg total) by mouth daily. 30 tablet 6  . carvedilol (COREG) 25 MG tablet Take 1 tablet (25 mg total) by mouth 2 (two) times daily with a meal. 60 tablet 6  . fenofibrate (TRICOR) 145 MG tablet Take 1 tablet (145 mg total) by mouth daily. (Patient taking differently: Take 145 mg by mouth every morning. ) 30 tablet 11  . hydrochlorothiazide (MICROZIDE) 12.5 MG capsule Take 12.5 mg by mouth every morning.     . insulin NPH-regular Human (NOVOLIN 70/30) (70-30) 100 UNIT/ML injection Inject 48 Units into the skin 2 (two) times daily.    . isosorbide mononitrate (IMDUR) 120 MG 24 hr tablet TAKE 1 TABLET BY MOUTH EVERY DAY (Patient taking differently: Take 120 mg by mouth every morning. ) 90 tablet 3  . losartan (COZAAR) 100 MG tablet Take 100 mg by mouth every morning.     . metFORMIN (GLUCOPHAGE) 500 MG tablet Take 1,000 mg by mouth 2 (two) times daily.    . metoprolol tartrate (LOPRESSOR) 100 MG tablet Take 150 mg by mouth 2 (two) times  daily.    . nitroGLYCERIN (NITROSTAT) 0.4 MG SL tablet Place 1 tablet (0.4 mg total) under the tongue every 5 (five) minutes as needed for chest pain. 25 tablet 11  . omeprazole (PRILOSEC) 20 MG capsule Take 20 mg by mouth every morning.     . ticagrelor (BRILINTA) 90 MG TABS tablet Take 1 tablet (90 mg total) by mouth 2 (two) times daily. 32 tablet 0   No current facility-administered medications for this visit.     Allergies:   Bee venom; Penicillins; and Plavix [clopidogrel bisulfate]    Social History:  The patient  reports that he quit smoking about 15 years ago. He has never used smokeless tobacco. He reports that he does not drink  alcohol or use drugs.   Family History:  The patient's family history includes Cancer in his brother and mother; Heart attack (age of onset: 53) in his father.  He indicated that his mother is deceased. He indicated that his father is deceased. He indicated that his brother is deceased.   ROS:  Please see the history of present illness. All other systems are reviewed and negative.    PHYSICAL EXAM: VS:  BP (!) 164/78   Pulse 69   Ht 5\' 11"  (1.803 m)   Wt 200 lb 9.6 oz (91 kg)   BMI 27.98 kg/m  , BMI Body mass index is 27.98 kg/m. GEN: Well nourished, well developed, male in no acute distress HEENT: normal for age  Neck: no JVD, no carotid bruit, no masses Cardiac: RRR; no murmur, no rubs, or gallops Respiratory:  clear to auscultation bilaterally, normal work of breathing GI: soft, nontender, nondistended, + BS MS: no deformity or atrophy; no edema; distal pulses are 2+ in upper extremities, bilateral DP/PT pulses are decreased, R>L, but capillary refill is within normal limits. Skin: warm and dry, no rash Neuro:  Strength and sensation are intact Psych: euthymic mood, full affect   EKG:  EKG is ordered today. The ekg ordered today demonstrates sinus rhythm, LVH with abnormal repol, no change from 07/22/2018  ECHO: 10/07/2017 - Left  ventricle: The cavity size was normal. Wall thickness was   increased in a pattern of mild LVH. The estimated ejection   fraction was 45%. There is hypokinesis of the basal-midinferior   and inferoseptal myocardium. Doppler parameters are consistent   with abnormal left ventricular relaxation (grade 1 diastolic   dysfunction). - Aortic valve: Poorly visualized. Moderately calcified annulus.   Mildly calcified leaflets. - Mitral valve: Mildly calcified annulus. There was trivial   regurgitation. - Right atrium: Central venous pressure (est): 3 mm Hg. - Atrial septum: No defect or patent foramen ovale was identified. - Tricuspid valve: There was trivial regurgitation. - Pulmonary arteries: Systolic pressure could not be accurately   estimated. - Pericardium, extracardiac: There was no pericardial effusion.  CORONARY STENT INTERVENTION  07/21/2018  Intravascular Ultrasound/IVUS  LEFT HEART CATH AND CORONARY ANGIOGRAPHY  Conclusion   Conclusions: 1. 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 2. Successful PCI to mid/distal LAD using Resolute Onyx 2.0 x 18 mm DES (post-dilated to 2.6 mm). 3. 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 4. EF 45-50%, + wall motion abnormality  Recommendations: 1. Remove right femoral artery sheath with manual compression in 2 hours. 2. Dual antiplatelet therapy with aspirin and ticagrelor for at least 12 months, ideally lifelong. 3. Aggressive secondary prevention. If restenosis of LAD stents occurs, cardiac surgery consultation for CABG would need to be considered.   Diagnostic  Dominance: Right   Intervention        Recent Labs: 07/20/2018: ALT 22; B Natriuretic Peptide  116.0 07/22/2018: BUN 24; Creatinine, Ser 0.85; Hemoglobin 11.7; Magnesium 2.3; Platelets 183; Potassium 3.4; Sodium 143  CBC    Component Value Date/Time   WBC 9.5 07/22/2018 0535   RBC 4.33 07/22/2018 0535   HGB 11.7 (L) 07/22/2018 0535   HCT 37.1 (L) 07/22/2018 0535   PLT 183 07/22/2018 0535  MCV 85.7 07/22/2018 0535   MCH 27.0 07/22/2018 0535   MCHC 31.5 07/22/2018 0535   RDW 14.0 07/22/2018 0535   LYMPHSABS 3.0 07/20/2018 2127   MONOABS 0.5 07/20/2018 2127   EOSABS 0.1 07/20/2018 2127   BASOSABS 0.1 07/20/2018 2127   CMP Latest Ref Rng & Units 07/22/2018 07/21/2018 07/20/2018  Glucose 70 - 99 mg/dL 945(O) 592(T) 244(Q)  BUN 8 - 23 mg/dL 28(M) 38(T) 20  Creatinine 0.61 - 1.24 mg/dL 7.71 1.65 7.90  Sodium 135 - 145 mmol/L 143 143 140  Potassium 3.5 - 5.1 mmol/L 3.4(L) 3.5 3.1(L)  Chloride 98 - 111 mmol/L 112(H) 108 107  CO2 22 - 32 mmol/L 23 21(L) 22  Calcium 8.9 - 10.3 mg/dL 8.2(L) 8.5(L) 8.3(L)  Total Protein 6.5 - 8.1 g/dL - - 6.5  Total Bilirubin 0.3 - 1.2 mg/dL - - 0.3  Alkaline Phos 38 - 126 U/L - - 53  AST 15 - 41 U/L - - 23  ALT 0 - 44 U/L - - 22   Lipid Panel Lab Results  Component Value Date   CHOL 132 05/30/2010   HDL 24 (L) 05/30/2010   LDLCALC See Comment mg/dL 38/33/3832   TRIG 919 (H) 05/30/2010   CHOLHDL 5.5 Ratio 05/30/2010      Wt Readings from Last 3 Encounters:  07/29/18 200 lb 9.6 oz (91 kg)  07/22/18 195 lb 8.8 oz (88.7 kg)  01/23/18 191 lb 9.6 oz (86.9 kg)     Other studies Reviewed: Additional studies/ records that were reviewed today include: Office notes, hospital records and testing.  ASSESSMENT AND PLAN:  1.  Acute on chronic combined CHF: His weight is up on our scales, but stable on his home scales.  His volume status is good by exam. -He is only on a low dose of HCTZ, 12.5 mg a day.  However, I see no need to increase this. - he is to start daily weights and notify us for gain of 3 pounds in a day or 5 pounds in a week. -Continue  beta-blocker and ARB  2.  Non-STEMI, subsequent episode of care: - He is tolerating baby aspirin and Brilinta well.  He is also on beta-blocker, high-dose statin and Imdur. - Continue current therapy, no changes.  3.  PAD: He is having claudication symptoms with ambulation.  He has a history of decreased pulses on the right, but those records are not available to me and he cannot tell me how long ago that was. - Check ABIs  4.  Non-insulin-dependent diabetes and diarrhea: His metformin was increased just prior to admission to the hospital.  After that, it was held a few days because of his heart catheterization and then he developed diarrhea after discharge. - The most likely medication culprit is the metformin. - He is to hold it for 2 days and then restart at the previous dose.  If his symptoms resolve, follow-up with PCP or the prescriber of the metformin. - If his symptoms do not resolve, contact us and we will start holding his medications one at a time to try to find the culprit, start with Lipitor   Current medicines are reviewed at length with the patient today.  The patient has concerns regarding medicines.  The following changes have been made: Hold metformin for 2 days to see if diarrhea improves  Labs/ tests ordered today include:   Orders Placed This Encounter  Procedures  . Basic metabolic panel  . EKG 12-Lead  Disposition:   FU with Rollene Rotunda, MD  Signed, Theodore Demark, PA-C  07/29/2018 1:59 PM    [facility] Phone: [phone]; Fax: [phone]

## 2018-07-29 NOTE — Patient Instructions (Signed)
Medication Instructions:  HOLD your Metformin for 2 days. Restart after at 500 mg twice daily. Follow-up with the prescribing doctor.  If you need a refill on your cardiac medications before your next appointment, please call your pharmacy.   Lab work: Your physician recommends that you return for lab work today: BMET  If you have labs (blood work) drawn today and your tests are completely normal, you will receive your results only by: Marland Kitchen MyChart Message (if you have MyChart) OR . A paper copy in the mail If you have any lab test that is abnormal or we need to change your treatment, we will call you to review the results.  Testing/Procedures: Your physician has requested that you have an ankle brachial index (ABI) TODAY. During this test an ultrasound and blood pressure cuff are used to evaluate the arteries that supply the arms and legs with blood. Allow thirty minutes for this exam. There are no restrictions or special instructions.  Follow-Up: At [facility], you and your health needs are our priority.  As part of our continuing mission to provide you with exceptional heart care, we have created designated Provider Care Teams.  These Care Teams include your primary Cardiologist (physician) and Advanced Practice Providers (APPs -  Physician Assistants and Nurse Practitioners) who all work together to provide you with the care you need, when you need it. Marland Kitchen Keep your appointment with Dr. Antoine Poche on March 11.   Any Other Special Instructions Will Be Listed Below (If Applicable). Daily Weight Record: It is important to weigh yourself daily. Keep yoru daily weight chart near your scale. Weigh yourself each morning at the same time. Weigh yourself without shoes, and try to wear the same amount of clothing each day. Compare today's weight to yesterday's weight. IF YOUR WEIGHT IS >3 POUNDS DAILY OR >5 POUNDS IN ONE WEEK, OR IF YOU ARE WAKING UP FEELING SHORT OF BREATH OR HAVE ADDITIONAL SWELLING,  PLEASE CALL OUR OFFICE TO LET us KNOW [phone].

## 2018-08-05 ENCOUNTER — Ambulatory Visit (INDEPENDENT_AMBULATORY_CARE_PROVIDER_SITE_OTHER): Payer: Medicare Other | Admitting: Cardiovascular Disease

## 2018-08-05 ENCOUNTER — Encounter: Payer: Self-pay | Admitting: Cardiovascular Disease

## 2018-08-05 VITALS — BP 144/76 | HR 63 | Ht 71.0 in | Wt 201.4 lb

## 2018-08-05 DIAGNOSIS — I10 Essential (primary) hypertension: Secondary | ICD-10-CM

## 2018-08-05 DIAGNOSIS — I739 Peripheral vascular disease, unspecified: Secondary | ICD-10-CM | POA: Diagnosis not present

## 2018-08-05 DIAGNOSIS — E782 Mixed hyperlipidemia: Secondary | ICD-10-CM | POA: Diagnosis not present

## 2018-08-05 DIAGNOSIS — I2510 Atherosclerotic heart disease of native coronary artery without angina pectoris: Secondary | ICD-10-CM | POA: Diagnosis not present

## 2018-08-05 NOTE — Progress Notes (Signed)
Cardiology Office Note   Date:  08/05/2018   ID:  [name] [name], DOB Jan 22, 1946, MRN [id number]  PCP:  [name], MD  Cardiologist:  Dr. Antoine Poche No chief complaint on file.     History of Present Illness: [name] [name] is a 73 y.o. male who was referred by Dr. Antoine Poche for evaluation and management of peripheral arterial disease.  He has known history of coronary artery disease with previous PCI .  He also has history of peripheral arterial disease, previous tobacco use, mild renal artery stenosis, type 2 diabetes, chronic systolic/diastolic heart failure, hypertension and hyperlipidemia. He reports prolonged history of bilateral calf discomfort for few years which is equal in both sides.  This happens after walking about 1-2 blocks and forces him to stop and rest for a few minutes before he can resume.  He reports that his symptoms have progressed slightly over the last 6 months.  He has no rest pain or lower extremity ulceration.  He is also limited by shortness of breath.  He underwent recent Doppler which showed an ABI of 0.69 on the right and 0.77 on the left.    Past Medical History:  Diagnosis Date  . CAP (community acquired pneumonia) 10/11/2017  . Coronary artery disease    a. Cath: 2000 nonobs dz. b. 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 s/p BMS to LAD & RCA ; s/p PCI/DES dRCA & PDA x2 2011. d. cath 02/17/14: LM patent, LAD calcified, diffuse irregs, no high grade stenosis mLAD stent patent, LCx, diffuse irregs no high-grade dz, RCA total occ w/ L to R collats, medical Rx rec  . Diabetes mellitus   . Diastolic dysfunction    a. echo 02/18/14: EF 45-50%, mild LVH, AK of inferior and inferoseptal myocardium, GR1DD  . Hiatal hernia   . Hyperlipidemia   . LV dysfunction   . NSTEMI (non-ST elevated myocardial infarction) (HCC) 10/07/2017  . PVD (peripheral vascular disease) (HCC)    ABIs of 0.69 on right and 0.93 on left  . Renal artery  stenosis (HCC)    Mild, bilateral  . S/P angioplasty with stent, atherectomy DES to pLAD and DES to [facility] 10/10/17  10/11/2017  . Sleep apnea    Mild    Past Surgical History:  Procedure Laterality Date  . CATARACT EXTRACTION    . CORONARY ATHERECTOMY N/A 10/10/2017   Procedure: CORONARY ATHERECTOMY - CSI;  Surgeon: Yvonne Kendall, MD;  Location: [facility] INVASIVE CV LAB;  Service: Cardiovascular;  Laterality: N/A;  . CORONARY STENT INTERVENTION N/A 10/10/2017   Procedure: CORONARY STENT INTERVENTION;  Surgeon: Yvonne Kendall, MD;  Location: [facility]VAS[facility] CV [facility];  Service: Cardiovascular;  Laterality: N/A;  . CORONARY STENT INTERVENTION N/A 07/21/2018   Procedure: CORONARY STENT INTERVENTION;  Surgeon: Yvonne Kendall, MD;  Location: [facility]VASIVE CV LAB;  Service: Cardiovascular;  Laterality: N/A;  . HIATAL HERNIA REPAIR    . INTRAVASCULAR ULTRASOUND/IVUS N/A 10/09/2017   Procedure: Intravascular Ultrasound/IVUS;  Surgeon: [name], MD;  Location: [facility] INVASIVE CV LAB;  Service: Cardiovascular;  Laterality: N/A;  . INTRAVASCULAR ULTRASOUND/IVUS N/A 07/21/2018   Procedure: Intravascular Ultrasound/IVUS;  Surgeon: Yvonne Kendall, MD;  Location: [facility];  Service: Cardiovascular;  Laterality: N/A;  . LEFT HEART CATH AND CORONARY ANGIOGRAPHY N/A 10/09/2017   Procedure: LEFT HEART CATH AND CORONARY ANGIOGRAPHY;  Surgeon: [name], MD;  Location: [facility] INVASIVE CV LAB;  Service: Cardiovascular;  Laterality: N/A;  .  LEFT HEART CATH AND CORONARY ANGIOGRAPHY N/A 07/21/2018   Procedure: LEFT HEART CATH AND CORONARY ANGIOGRAPHY;  Surgeon: Yvonne Kendall, MD;  Location: [facility] INVAS[facility] CV LAB;  Service: Cardiovascular;  Laterality: N/A;  . LEFT HEART CATHETERIZATION WITH CORONARY ANGIOGRAM N/A 02/17/2014   Procedure: LEFT HEART CATHETERIZATION WITH CORONARY ANGIOGRAM;  Surgeon: Micheline Chapman, MD;  Location: [facility] CATH LAB;  Service: Cardiovascular;  Laterality: N/A;  . SHOULDER ARTHROSCOPY      Right  . 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 SOFT TISSUE TUMOR RESECTION     Benign neck tumor  . Vocal cord polyp removal       Current Outpatient Medications  Medication Sig Dispense Refill  . allopurinol (ZYLOPRIM) 100 MG tablet Take 100 mg by mouth every morning.     Marland Kitchen amLODipine (NORVASC) 10 MG tablet Take 1 tablet (10 mg total) by mouth daily. (Patient taking differently: Take 10 mg by mouth every morning. ) 90 tablet 3  . aspirin 81 MG tablet Take 81 mg by mouth every morning.     Marland Kitchen atorvastatin (LIPITOR) 80 MG tablet Take 1 tablet (80 mg total) by mouth daily. 30 tablet 6  . carvedilol (COREG) 25 MG tablet Take 1 tablet (25 mg total) by mouth 2 (two) times daily with a meal. 60 tablet 6  . fenofibrate (TRICOR) 145 MG tablet Take 1 tablet (145 mg total) by mouth daily. (Patient taking differently: Take 145 mg by mouth every morning. ) 30 tablet 11  . hydrochlorothiazide (MICROZIDE) 12.5 MG capsule Take 12.5 mg by mouth every morning.     . insulin NPH-regular Human (NOVOLIN 70/30) (70-30) 100 UNIT/ML injection Inject 48 Units into the skin 2 (two) times daily.    . isosorbide mononitrate (IMDUR) 120 MG 24 hr tablet TAKE 1 TABLET BY MOUTH EVERY DAY (Patient taking differently: Take 120 mg by mouth every morning. ) 90 tablet 3  . losartan (COZAAR) 100 MG tablet Take 100 mg by mouth every morning.     . metFORMIN (GLUCOPHAGE) 500 MG tablet Take 1,000 mg by mouth 2 (two) times daily.    . metoprolol tartrate (LOPRESSOR) 100 MG tablet Take 150 mg by mouth 2 (two) times daily.    . nitroGLYCERIN (NITROSTAT) 0.4 MG SL tablet Place 1 tablet (0.4 mg total) under the tongue every 5 (five) minutes as needed for chest pain. 25 tablet 11  . omeprazole (PRILOSEC) 20 MG capsule Take 20 mg by mouth every morning.     . ticagrelor (BRILINTA) 90 MG TABS tablet Take 1 tablet (90 mg total) by mouth 2 (two) times daily. 32 tablet 0   No current facility-administered  medications for this visit.     Allergies:   Bee venom; Penicillins; and Plavix [clopidogrel bisulfate]    Social History:  The patient  reports that he quit smoking about 15 years ago. He has never used smokeless tobacco. He reports that he does not drink alcohol or use drugs.   Family History:  The patient's family history includes Cancer in his brother and mother; Heart attack (age of onset: 54) in his father.    ROS:  Please see the history of present illness.   Otherwise, review of systems are positive for none.   All other systems are reviewed and negative.    PHYSICAL EXAM: VS:  BP (!) 144/76   Pulse 63   Ht 5\' 11"  (1.803 m)   Wt 201 lb 6.4  oz (91.4 kg)   BMI 28.09 kg/m  , BMI Body mass index is 28.09 kg/m. GEN: Well nourished, well developed, in no acute distress  HEENT: normal  Neck: no JVD, carotid bruits, or masses Cardiac: RRR; no murmurs, rubs, or gallops,no edema  Respiratory:  clear to auscultation bilaterally, normal work of breathing GI: soft, nontender, nondistended, + BS MS: no deformity or atrophy  Skin: warm and dry, no rash Neuro:  Strength and sensation are intact Psych: euthymic mood, full affect Vascular: Femoral pulses normal bilaterally.  Distal pulses are not palpable.  EKG:  EKG is ordered today. The ekg ordered today demonstrates normal sinus rhythm with LVH and repolarization abnormalities.   Recent Labs: 07/20/2018: ALT 22; B Natriuretic Peptide 116.0 07/22/2018: Hemoglobin 11.7; Magnesium 2.3; Platelets 183 07/29/2018: BUN 22; Creatinine, Ser 0.90; Potassium 4.3; Sodium 142    Lipid Panel    Component Value Date/Time   CHOL 132 05/30/2010 0938   TRIG 790 (H) 05/30/2010 0938   HDL 24 (L) 05/30/2010 0938   CHOLHDL 5.5 Ratio 05/30/2010 0938   VLDL NOT CALC mg/dL 07/86/7544 9201   LDLCALC See Comment mg/dL 00/71/2197 5883      Wt Readings from Last 3 Encounters:  08/05/18 201 lb 6.4 oz (91.4 kg)  07/29/18 200 lb 9.6 oz (91 kg)    07/22/18 195 lb 8.8 oz (88.7 kg)       No flowsheet data found.    ASSESSMENT AND PLAN:  1.  Peripheral arterial disease with severe bilateral calf claudication Rutherford class III.  I discussed with him the natural history and management of claudication.  Given severity of his symptoms, I gave him the option of angiography and endovascular intervention but he reports that his symptoms have been relatively stable over the last few years and he prefers to continue medical therapy for now.  He is willing to consider angiography if symptoms worsen. I am going to obtain lower extremity arterial duplex to see if his disease is approachable endovascularly. He is already on optimal medical therapy for his coronary artery disease.  2.  Coronary artery disease involving native coronary arteries: Currently with no anginal symptoms.  Continue medical therapy.  3.  Hyperlipidemia: On high-dose atorvastatin 80 mg daily.  He is also on fenofibrate given severely elevated triglyceride.  4.  Essential hypertension: Blood pressure is reasonably controlled on current medications.   Disposition:   FU with me in 3 months  Signed,  Lorine Bears, MD  08/05/2018 9:09 AM     [facility]

## 2018-08-05 NOTE — Patient Instructions (Addendum)
Medication Instructions:  No changes If you need a refill on your cardiac medications before your next appointment, please call your pharmacy.   Lab work: None ordered  Testing/Procedures: Your physician has requested that you have a lower extremity arterial duplex. During this test, ultrasound is used to evaluate arterial blood flow in the legs. Allow one hour for this exam. There are no restrictions or special instructions.   Follow-Up: At [facility], you and your health needs are our priority.  As part of our continuing mission to provide you with exceptional heart care, we have created designated Provider Care Teams.  These Care Teams include your primary Cardiologist (physician) and Advanced Practice Providers (APPs -  Physician Assistants and Nurse Practitioners) who all work together to provide you with the care you need, when you need it. You will need a follow up appointment in 3 months. You may see Dr. Kirke Corin or one of the following Advanced Practice Providers on your designated Care Team:   Corine Shelter, PA-C Judy Pimple, [place] . Marjie Skiff, PA-C

## 2018-08-11 ENCOUNTER — Encounter: Payer: Self-pay | Admitting: Cardiology

## 2018-08-14 ENCOUNTER — Telehealth: Payer: Self-pay | Admitting: *Deleted

## 2018-08-14 NOTE — Telephone Encounter (Signed)
Stop metoprolol. Continue coreg 25mg  BID. Keep log of Blood pressure and call next week with readings.

## 2018-08-14 NOTE — Telephone Encounter (Signed)
-----   Message from [place], [place] sent at 08/14/2018 11:49 AM EST ----- I got letter that patient on two type of BB (carvedilol and metoprolol). Please ask the patient does he taking both or only one of these?  Seems like his metoprolol changed to carvedilol in hospital.  Thanks  [facility]

## 2018-08-14 NOTE — Telephone Encounter (Signed)
PER PT IS TAKING METOPROLOL AND CARVEDILOL BOTH WILL FORWARD TO VIN BHAGAT PA FOR RECOMMENDATIONS .[name] [name]

## 2018-08-14 NOTE — Telephone Encounter (Signed)
Pt aware of new directions and verbalizes understanding ./cy

## 2018-08-25 NOTE — Progress Notes (Signed)
HPI The patient presents for follow up of his CAD.  He was admitted Sept 2015 with non-ST elevation MI. He was found to have an occluded right coronary artery but patent LAD stents. He was managed medically.   Since I last saw him he was in the hospital in April.  He had pneumonia.  He had C. difficile.  He also had non-Q wave MI with disease as listed below and ended up with staged PCI.  Ejection fraction was 45% which is essentially unchanged from previous.  He saw Dr. Kirke Corin recently with leg pain and PVD.   He is going to have arterial Dopplers and may need to be considered for angiography.  In January he did have increasing shortness of breath and some chest discomfort.  He was diagnosed with NSTEMI and I reviewed the hospital records.  He underwent PCI as below by Dr. Okey Dupre.  He says that since then he feels better.  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  He is not had to take any nitroglycerin.  He denies any chest pressure, neck or arm discomfort.  He has had no palpitations, presyncope or syncope.  He seems to have stable leg pain with ambulation.  Allergies  Allergen Reactions  . Bee Venom Swelling  . Penicillins Itching    Did it involve swelling of the face/tongue/throat, SOB, or low BP? Yes-swelling Did it involve sudden or severe rash/hives, skin peeling, or any reaction on the inside of your mouth or nose? No-itching Did you need to seek medical attention at a hospital or doctor's office? Yes When did it last happen? Over 10 years  If all above answers are "NO", may proceed with cephalosporin use.   Marland Kitchen Plavix [Clopidogrel Bisulfate] Other (See Comments)    Patient is noted to be a nonresponder to Plavix    Current Outpatient Medications  Medication Sig Dispense Refill  . allopurinol (ZYLOPRIM) 100 MG tablet Take 100 mg by mouth every morning.     Marland Kitchen amLODipine (NORVASC) 10 MG tablet Take 1 tablet (10 mg  total) by mouth daily. (Patient taking differently: Take 10 mg by mouth every morning. ) 90 tablet 3  . aspirin 81 MG tablet Take 81 mg by mouth every morning.     Marland Kitchen atorvastatin (LIPITOR) 80 MG tablet Take 1 tablet (80 mg total) by mouth daily. 30 tablet 6  . carvedilol (COREG) 25 MG tablet Take 1 tablet (25 mg total) by mouth 2 (two) times daily with a meal. 60 tablet 6  . fenofibrate (TRICOR) 145 MG tablet Take 1 tablet (145 mg total) by mouth daily. (Patient taking differently: Take 145 mg by mouth every morning. ) 30 tablet 11  . hydrochlorothiazide (MICROZIDE) 12.5 MG capsule Take 12.5 mg by mouth every morning.     . insulin NPH-regular Human (NOVOLIN 70/30) (70-30) 100 UNIT/ML injection Inject 48 Units into the skin 2 (two) times daily.    . isosorbide mononitrate (IMDUR) 120 MG 24 hr tablet TAKE 1 TABLET BY MOUTH EVERY DAY (Patient taking differently: Take 120 mg by mouth every morning. ) 90 tablet 3  . losartan (COZAAR) 100 MG tablet Take 100 mg by mouth every morning.     . metFORMIN (GLUCOPHAGE) 500 MG tablet Take 1,000 mg by mouth 2 (two) times daily.    . nitroGLYCERIN (NITROSTAT) 0.4 MG SL tablet Place 1 tablet (0.4 mg total) under the tongue  every 5 (five) minutes as needed for chest pain. 25 tablet 11  . omeprazole (PRILOSEC) 20 MG capsule Take 20 mg by mouth every morning.     . ticagrelor (BRILINTA) 90 MG TABS tablet Take 1 tablet (90 mg total) by mouth 2 (two) times daily. 32 tablet 0   No current facility-administered medications for this visit.     Past Medical History:  Diagnosis Date  . CAP (community acquired pneumonia) 10/11/2017  . Coronary artery disease    a. Cath: 2000 nonobs dz. b. 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 s/p BMS to LAD & RCA ; s/p PCI/DES dRCA & PDA x2 2011. d. cath 02/17/14: LM patent, LAD calcified, diffuse irregs, no high grade stenosis mLAD stent patent, LCx, diffuse irregs no high-grade dz, RCA total occ w/ L to R  collats, medical Rx rec  . Diabetes mellitus   . Diastolic dysfunction    a. echo 02/18/14: EF 45-50%, mild LVH, AK of inferior and inferoseptal myocardium, GR1DD  . Hiatal hernia   . Hyperlipidemia   . LV dysfunction   . NSTEMI (non-ST elevated myocardial infarction) (HCC) 10/07/2017  . PVD (peripheral vascular disease) (HCC)    ABIs of 0.69 on right and 0.93 on left  . Renal artery stenosis (HCC)    Mild, bilateral  . S/P angioplasty with stent, atherectomy DES to pLAD and DES to [facility] 10/10/17  10/11/2017  . Sleep apnea    Mild    Past Surgical History:  Procedure Laterality Date  . CATARACT EXTRACTION    . CORONARY ATHERECTOMY N/A 10/10/2017   Procedure: CORONARY ATHERECTOMY - CSI;  Surgeon: Yvonne Kendall, MD;  Location: [facility] INVASIVE CV LAB;  Service: Cardiovascular;  Laterality: N/A;  . CORONARY STENT INTERVENTION N/A 10/10/2017   Procedure: CORONARY STENT INTERVENTION;  Surgeon: Yvonne Kendall, MD;  Location: [facility] INVASIVE CV LAB;  Service: Cardiovascular;  Laterality: N/A;  . CORONARY STENT INTERVENTION N/A 07/21/2018   Procedure: CORONARY STENT INTERVENTION;  Surgeon: Yvonne Kendall, MD;  Location: [facility] INVASIVE CV LAB;  Service: Cardiovascular;  Laterality: N/A;  . HIATAL HERNIA REPAIR    . INTRAVASCULAR ULTRASOUND/IVUS N/A 10/09/2017   Procedure: Intravascular Ultrasound/IVUS;  Surgeon: [name], MD;  Location: [facility] INVASIVE CV LAB;  Service: Cardiovascular;  Laterality: N/A;  . INTRAVASCULAR ULTRASOUND/IVUS N/A 07/21/2018   Procedure: Intravascular Ultrasound/IVUS;  Surgeon: Yvonne Kendall, MD;  Location: [facility]VASIVE CV LAB;  Service: Cardiovascular;  Laterality: N/A;  . LEFT HEART CATH AND CORONARY ANGIOGRAPHY N/A 10/09/2017   Procedure: LEFT HEART CATH AND CORONARY ANGIOGRAPHY;  Surgeon: [name], MD;  Location: [facility] INVASIVE CV LAB;  Service: Cardiovascular;  Laterality: N/A;  . LEFT HEART CATH AND CORONARY ANGIOGRAPHY N/A 07/21/2018   Procedure: LEFT HEART CATH AND  CORONARY ANGIOGRAPHY;  Surgeon: Yvonne Kendall, MD;  Location: [facility] INVASIVE CV LAB;  Service: Cardiovascular;  Laterality: N/A;  . LEFT HEART CATHETERIZATION WITH CORONARY ANGIOGRAM N/A 02/17/2014   Procedure: LEFT HEART CATHETERIZATION WITH CORONARY ANGIOGRAM;  Surgeon: Micheline Chapman, MD;  Location: [facility] CATH LAB;  Service: Cardiovascular;  Laterality: N/A;  . SHOULDER ARTHROSCOPY     Right  . 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 SOFT TISSUE TUMOR RESECTION     Benign neck tumor  . Vocal cord polyp removal      ROS:    Positive for insomnia.  Otherwise as stated in the HPI and  negative for all other systems.   PHYSICAL EXAM BP (!) 150/74   Pulse 80   Ht 5\' 10"  (1.778 m)   Wt 200 lb (90.7 kg)   BMI 28.70 kg/m   GENERAL:  Well appearing NECK:  No jugular venous distention, waveform within normal limits, carotid upstroke brisk and symmetric, no bruits, no thyromegaly LUNGS:  Clear to auscultation bilaterally CHEST:  Unremarkable HEART:  PMI not displaced or sustained,S1 and S2 within normal limits, no S3, no S4, no clicks, no rubs, no murmurs ABD:  Flat, positive bowel sounds normal in frequency in pitch, no bruits, no rebound, no guarding, no midline pulsatile mass, no hepatomegaly, no splenomegaly EXT:  2 plus pulses upper and decreased DP/PT bilateral, no edema, no cyanosis no clubbing SKIN:  bruising   EKG:  NA  Cardiac cath 07/21/18:  Diagnostic  Dominance: Right    Intervention      Conclusions: 1. 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 2. Successful PCI to mid/distal LAD using Resolute Onyx 2.0 x 18 mm DES (post-dilated to 2.6 mm). 3. Successful IVUS-guided PCI to ostial LAD (arises from aorta, given anomalous LCx) using Resolute Onyx 3.5 x 12 mm DES (post-dilated to  4.1 mm).   ASSESSMENT AND PLAN  CAD:   The patient has no new sypmtoms.  No further cardiovascular testing is indicated.  We will continue with aggressive risk reduction and meds as listed.   Chart reviewed.  HTN:   The blood pressure is at target.  No change in therapy.   DM:     His blood sugar is not at target.  The A1c is gone up to 8.1 from 7.6.  I am then to send this note to his endocrinologist questioning whether he would be a good candidate for SGLT2i..  PVD:    He has Doppler scheduled.  He will follow-up with Dr. Kirke Corin.   DYSLIPIDEMIA:   He has LDL was 24.  Continue current therapy.   BRUIT:  He had mild carotid plaque in 2019.   No further testing is indicated.

## 2018-08-27 ENCOUNTER — Ambulatory Visit (INDEPENDENT_AMBULATORY_CARE_PROVIDER_SITE_OTHER): Payer: Medicare Other | Admitting: Cardiology

## 2018-08-27 ENCOUNTER — Other Ambulatory Visit: Payer: Self-pay

## 2018-08-27 ENCOUNTER — Encounter: Payer: Self-pay | Admitting: Cardiology

## 2018-08-27 VITALS — BP 150/74 | HR 80 | Ht 70.0 in | Wt 200.0 lb

## 2018-08-27 DIAGNOSIS — E785 Hyperlipidemia, unspecified: Secondary | ICD-10-CM

## 2018-08-27 DIAGNOSIS — I10 Essential (primary) hypertension: Secondary | ICD-10-CM | POA: Diagnosis not present

## 2018-08-27 DIAGNOSIS — I214 Non-ST elevation (NSTEMI) myocardial infarction: Secondary | ICD-10-CM

## 2018-08-27 DIAGNOSIS — I2510 Atherosclerotic heart disease of native coronary artery without angina pectoris: Secondary | ICD-10-CM | POA: Diagnosis not present

## 2018-08-27 DIAGNOSIS — E119 Type 2 diabetes mellitus without complications: Secondary | ICD-10-CM | POA: Diagnosis not present

## 2018-08-27 DIAGNOSIS — I739 Peripheral vascular disease, unspecified: Secondary | ICD-10-CM | POA: Diagnosis not present

## 2018-08-27 MED ORDER — NITROGLYCERIN 0.4 MG SL SUBL: 0.4000 mg | SUBLINGUAL_TABLET | SUBLINGUAL | 11 refills | Status: DC | PRN

## 2018-08-27 NOTE — Patient Instructions (Signed)
Medication Instructions:  The current medical regimen is effective;  continue present plan and medications.  If you need a refill on your cardiac medications before your next appointment, please call your pharmacy.   Follow-Up: Follow up in 6 months with Dr. Antoine Poche in [place].  Please call in July for a September appointment.  Thank you for choosing [facility]!!

## 2018-08-28 ENCOUNTER — Other Ambulatory Visit: Payer: Self-pay | Admitting: Cardiovascular Disease

## 2018-08-28 DIAGNOSIS — I739 Peripheral vascular disease, unspecified: Secondary | ICD-10-CM

## 2018-09-01 ENCOUNTER — Ambulatory Visit (HOSPITAL_COMMUNITY)
Admission: RE | Admit: 2018-09-01 | Discharge: 2018-09-01 | Disposition: A | Discharge status: Home / Self Care | Payer: Medicare Other | Source: Ambulatory Visit | Attending: Cardiovascular Disease | Admitting: Cardiovascular Disease

## 2018-09-01 ENCOUNTER — Other Ambulatory Visit: Payer: Self-pay

## 2018-09-01 DIAGNOSIS — I739 Peripheral vascular disease, unspecified: Secondary | ICD-10-CM | POA: Diagnosis not present

## 2018-10-01 ENCOUNTER — Telehealth: Payer: Self-pay | Admitting: Cardiology

## 2018-10-01 MED ORDER — TICAGRELOR 90 MG PO TABS: 90.0000 mg | ORAL_TABLET | Freq: Two times a day (BID) | ORAL | 0 refills | Status: DC

## 2018-10-01 NOTE — Telephone Encounter (Signed)
  Patient calling the office for samples of medication:   1.  What medication and dosage are you requesting samples for? ticagrelor (BRILINTA) 90 MG TABS tablet  2.  Are you currently out of this medication? Patient has 2 days left

## 2018-10-01 NOTE — Telephone Encounter (Signed)
PATIENT AWARE SAMPLES ARE AVAILABLE FOR PICK UP - 2 BOTTLES PATIENT STATE IT COST HIM @ 400  MONTH FOR BRILITINA

## 2018-10-30 ENCOUNTER — Telehealth: Payer: Self-pay

## 2018-10-30 ENCOUNTER — Other Ambulatory Visit: Payer: Self-pay | Admitting: Cardiology

## 2018-10-30 NOTE — Telephone Encounter (Signed)
Virtual Visit Pre-Appointment Phone Call  "(Name), I am calling you today to discuss your upcoming appointment. We are currently trying to limit exposure to the virus that causes COVID-19 by seeing patients at home rather than in the office."  1. "What is the BEST phone number to call the day of the visit?" - include this in appointment notes  2. "Do you have or have access to (through a family member/friend) a smartphone with video capability that we can use for your visit?" a. If yes - list this number in appt notes as "cell" (if different from BEST phone #) and list the appointment type as a VIDEO visit in appointment notes b. If no - list the appointment type as a PHONE visit in appointment notes  3. Confirm consent - "In the setting of the current Covid19 crisis, you are scheduled for a (phone or video) visit with your provider on (date) at (time).  Just as we do with many in-office visits, in order for you to participate in this visit, we must obtain consent.  If you'd like, I can send this to your mychart (if signed up) or email for you to review.  Otherwise, I can obtain your verbal consent now.  All virtual visits are billed to your insurance company just like a normal visit would be.  By agreeing to a virtual visit, we'd like you to understand that the technology does not allow for your provider to perform an examination, and thus may limit your provider's ability to fully assess your condition. If your provider identifies any concerns that need to be evaluated in person, we will make arrangements to do so.  Finally, though the technology is pretty good, we cannot assure that it will always work on either your or our end, and in the setting of a video visit, we may have to convert it to a phone-only visit.  In either situation, we cannot ensure that we have a secure connection.  Are you willing to proceed?" STAFF: Did the patient verbally acknowledge consent to telehealth visit? Document  YES/NO here: YES   4. Advise patient to be prepared - "Two hours prior to your appointment, go ahead and check your blood pressure, pulse, oxygen saturation, and your weight (if you have the equipment to check those) and write them all down. When your visit starts, your provider will ask you for this information. If you have an Apple Watch or Kardia device, please plan to have heart rate information ready on the day of your appointment. Please have a pen and paper handy nearby the day of the visit as well."  5. Give patient instructions for MyChart download to smartphone OR Doximity/Doxy.me as below if video visit (depending on what platform provider is using)  6. Inform patient they will receive a phone call 15 minutes prior to their appointment time (may be from unknown caller ID) so they should be prepared to answer    TELEPHONE CALL NOTE  Shahid Amburn has been deemed a candidate for a follow-up tele-health visit to limit community exposure during the Covid-19 pandemic. I spoke with the patient via phone to ensure availability of phone/video source, confirm preferred email & phone number, and discuss instructions and expectations.  I reminded Journey Rodabaugh to be prepared with any vital sign and/or heart rhythm information that could potentially be obtained via home monitoring, at the time of his visit. I reminded Arman Eastridge to expect a phone call prior to his visit.  Bishop Dublin, CMA 10/30/2018 3:01 PM   INSTRUCTIONS FOR DOWNLOADING THE MYCHART APP TO SMARTPHONE  - The patient must first make sure to have activated MyChart and know their login information - If Apple, go to [employer] and type in MyChart in the search bar and download the app. If Android, ask patient to go to [employer] and type in [place] in the search bar and download the app. The app is free but as with any other app downloads, their phone may require them to verify saved payment information or  Apple/Android password.  - The patient will need to then log into the app with their MyChart username and password, and select [facility] as their healthcare provider to link the account. When it is time for your visit, go to the MyChart app, find appointments, and click Begin Video Visit. Be sure to Select Allow for your device to access the Microphone and Camera for your visit. You will then be connected, and your provider will be with you shortly.  **If they have any issues connecting, or need assistance please contact MyChart service desk [phone])**  **If using a computer, in order to ensure the best quality for their visit they will need to use either of the following Internet Browsers: [employer] or Google Chrome**  IF USING DOXIMITY or DOXY.ME - The patient will receive a link just prior to their visit by text.     FULL LENGTH CONSENT FOR TELE-HEALTH VISIT   I hereby voluntarily request, consent and authorize [facility] and its employed or contracted physicians, physician assistants, nurse practitioners or other licensed health care professionals (the Practitioner), to provide me with telemedicine health care services (the "Services") as deemed necessary by the treating Practitioner. I acknowledge and consent to receive the Services by the Practitioner via telemedicine. I understand that the telemedicine visit will involve communicating with the Practitioner through live audiovisual communication technology and the disclosure of certain medical information by electronic transmission. I acknowledge that I have been given the opportunity to request an in-person assessment or other available alternative prior to the telemedicine visit and am voluntarily participating in the telemedicine visit.  I understand that I have the right to withhold or withdraw my consent to the use of telemedicine in the course of my care at any time, without affecting my right to future care  or treatment, and that the Practitioner or I may terminate the telemedicine visit at any time. I understand that I have the right to inspect all information obtained and/or recorded in the course of the telemedicine visit and may receive copies of available information for a reasonable fee.  I understand that some of the potential risks of receiving the Services via telemedicine include:  Marland Kitchen Delay or interruption in medical evaluation due to technological equipment failure or disruption; . Information transmitted may not be sufficient (e.g. poor resolution of images) to allow for appropriate medical decision making by the Practitioner; and/or  . In rare instances, security protocols could fail, causing a breach of personal health information.  Furthermore, I acknowledge that it is my responsibility to provide information about my medical history, conditions and care that is complete and accurate to the best of my ability. I acknowledge that Practitioner's advice, recommendations, and/or decision may be based on factors not within their control, such as incomplete or inaccurate data provided by me or distortions of diagnostic images or specimens that may result from electronic transmissions. I understand that the  practice of medicine is not an Chief Strategy Officer and that Practitioner makes no warranties or guarantees regarding treatment outcomes. I acknowledge that I will receive a copy of this consent concurrently upon execution via email to the email address I last provided but may also request a printed copy by calling the office of [place].    I understand that my insurance will be billed for this visit.   I have read or had this consent read to me. . I understand the contents of this consent, which adequately explains the benefits and risks of the Services being provided via telemedicine.  . I have been provided ample opportunity to ask questions regarding this consent and the Services and have had  my questions answered to my satisfaction. . I give my informed consent for the services to be provided through the use of telemedicine in my medical care  By participating in this telemedicine visit I agree to the above.

## 2018-11-03 ENCOUNTER — Telehealth: Payer: Self-pay | Admitting: Cardiovascular Disease

## 2018-11-04 ENCOUNTER — Telehealth (INDEPENDENT_AMBULATORY_CARE_PROVIDER_SITE_OTHER): Payer: Medicare Other | Admitting: Cardiovascular Disease

## 2018-11-04 ENCOUNTER — Encounter: Payer: Self-pay | Admitting: Cardiovascular Disease

## 2018-11-04 VITALS — BP 129/66 | HR 86 | Ht 71.5 in | Wt 200.0 lb

## 2018-11-04 DIAGNOSIS — I739 Peripheral vascular disease, unspecified: Secondary | ICD-10-CM | POA: Diagnosis not present

## 2018-11-04 NOTE — Patient Instructions (Signed)
Medication Instructions:  Continue same medications If you need a refill on your cardiac medications before your next appointment, please call your pharmacy.   Lab work: None If you have labs (blood work) drawn today and your tests are completely normal, you will receive your results only by: . MyChart Message (if you have MyChart) OR . A paper copy in the mail If you have any lab test that is abnormal or we need to change your treatment, we will call you to review the results.  Testing/Procedures: None  Follow-Up: Follow-up with Dr. Arida in 6 months 

## 2018-11-04 NOTE — Progress Notes (Signed)
Virtual Visit via Telephone Note   This visit type was conducted due to national recommendations for restrictions regarding the COVID-19 Pandemic (e.g. social distancing) in an effort to limit this patient's exposure and mitigate transmission in our community.  Due to his co-morbid illnesses, this patient is at least at moderate risk for complications without adequate follow up.  This format is felt to be most appropriate for this patient at this time.  The patient did not have access to video technology/had technical difficulties with video requiring transitioning to audio format only (telephone).  All issues noted in this document were discussed and addressed.  No physical exam could be performed with this format.  Please refer to the patient's chart for his  consent to telehealth for [facility]   Date:  11/04/2018   ID:  [name] [name], DOB [birth year], MRN [id number]  Patient Location: Home Provider Location: Office  PCP:  [name], MD  Cardiologist:  Rollene Rotunda, MD  Electrophysiologist:  None   Evaluation Performed:  Follow-Up Visit  Chief Complaint: Leg claudication  History of Present Illness:    [name] [name] is a 73 y.o. male was reached via phone for follow-up visit regarding peripheral arterial disease.  He has known history of coronary artery disease status post PCI most recently in February of this year,  peripheral arterial disease, previous tobacco use, mild renal artery stenosis, type 2 diabetes, chronic systolic/diastolic heart failure, hypertension and hyperlipidemia. He has prolonged history of bilateral calf claudication with moderately reduced ABI.  Duplex was done recently and showed bilateral SFA occlusion with collaterals.  He reports no worsening symptoms.  No lower extremity ulceration or rest pain.  He continues to have stable bilateral calf claudication.  The patient does not have symptoms concerning for COVID-19 infection (fever, chills,  cough, or new shortness of breath).    Past Medical History:  Diagnosis Date  . CAP (community acquired pneumonia) 10/11/2017  . Coronary artery disease    a. Cath: 2000 nonobs dz. b. 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 s/p BMS to LAD & RCA ; s/p PCI/DES dRCA & PDA x2 2011. d. cath 02/17/14: LM patent, LAD calcified, diffuse irregs, no high grade stenosis mLAD stent patent, LCx, diffuse irregs no high-grade dz, RCA total occ w/ L to R collats, medical Rx rec  . Diabetes mellitus   . Diastolic dysfunction    a. echo 02/18/14: EF 45-50%, mild LVH, AK of inferior and inferoseptal myocardium, GR1DD  . Hiatal hernia   . Hyperlipidemia   . LV dysfunction   . NSTEMI (non-ST elevated myocardial infarction) (HCC) 10/07/2017  . PVD (peripheral vascular disease) (HCC)    ABIs of 0.69 on right and 0.93 on left  . Renal artery stenosis (HCC)    Mild, bilateral  . S/P angioplasty with stent, atherectomy DES to pLAD and DES to [facility] 10/10/17  10/11/2017  . Sleep apnea    Mild   Past Surgical History:  Procedure Laterality Date  . CATARACT EXTRACTION    . CORONARY ATHERECTOMY N/A 10/10/2017   Procedure: CORONARY ATHERECTOMY - CSI;  Surgeon: Yvonne Kendall, MD;  Location: [facility] INVASIVE CV LAB;  Service: Cardiovascular;  Laterality: N/A;  . CORONARY STENT INTERVENTION N/A 10/10/2017   Procedure: CORONARY STENT INTERVENTION;  Surgeon: Yvonne Kendall, MD;  Location: [facility] INVASIVE CV LAB;  Service: Cardiovascular;  Laterality: N/A;  . CORONARY STENT INTERVENTION N/A 07/21/2018   Procedure: CORONARY STENT INTERVENTION;  Surgeon: Yvonne Kendall, MD;  Location: [facility] INVASIVE CV LAB;  Service: Cardiovascular;  Laterality: N/A;  . HIATAL HERNIA REPAIR    . INTRAVASCULAR ULTRASOUND/IVUS N/A 10/09/2017   Procedure: Intravascular Ultrasound/IVUS;  Surgeon: [name], MD;  Location[facility]VASIVE CV LAB;  Service: Cardiovascular;  Laterality: N/A;  . INTRAVASCULAR ULTRASOUND/IVUS N/A  07/21/2018   Procedure: Intravascular Ultrasound/IVUS;  Surgeon: Yvonne Kendall, MD;  Location: [facility] INVASIVE CV LAB;  Service: Cardiovascular;  Laterality: N/A;  . LEFT HEART CATH AND CORONARY ANGIOGRAPHY N/A 10/09/2017   Procedure: LEFT HEART CATH AND CORONARY ANGIOGRAPHY;  Surgeon: [name], MD;  Location[facility] CV LAB;  Service: Cardiovascular;  Laterality: N/A;  . LEFT HEART CATH AND CORONARY ANGIOGRAPHY N/A 07/21/2018   Procedure: LEFT HEART CATH AND CORONARY ANGIOGRAPHY;  Surgeon: Yvonne Kendall, MD;  Location: [facility] INVASIVE CV LAB;  Service: Cardiovascular;  Laterality: N/A;  . LEFT HEART CATHETERIZATION WITH CORONARY ANGIOGRAM N/A 02/17/2014   Procedure: LEFT HEART CATHETERIZATION WITH CORONARY ANGIOGRAM;  Surgeon: Micheline Chapman, MD;  Location: [facility] CATH LAB;  Service: Cardiovascular;  Laterality: N/A;  . SHOULDER ARTHROSCOPY     Right  . 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 SOFT TISSUE TUMOR RESECTION     Benign neck tumor  . Vocal cord polyp removal       Current Meds  Medication Sig  . allopurinol (ZYLOPRIM) 100 MG tablet Take 100 mg by mouth every morning.   Marland Kitchen amLODipine (NORVASC) 10 MG tablet Take 1 tablet (10 mg total) by mouth daily. (Patient taking differently: Take 10 mg by mouth every morning. )  . aspirin 81 MG tablet Take 81 mg by mouth every morning.   Marland Kitchen atorvastatin (LIPITOR) 80 MG tablet Take 1 tablet (80 mg total) by mouth daily.  . carvedilol (COREG) 25 MG tablet Take 1 tablet (25 mg total) by mouth 2 (two) times daily with a meal.  . fenofibrate (TRICOR) 145 MG tablet TAKE 1 TABLET BY MOUTH EVERY DAY  . hydrochlorothiazide (MICROZIDE) 12.5 MG capsule Take 12.5 mg by mouth every morning.   . insulin NPH-regular Human (NOVOLIN 70/30) (70-30) 100 UNIT/ML injection Inject 48 Units into the skin 2 (two) times daily.  . isosorbide mononitrate (IMDUR) 120 MG 24 hr tablet TAKE 1 TABLET BY MOUTH EVERY DAY (Patient taking differently: Take  120 mg by mouth every morning. )  . losartan (COZAAR) 100 MG tablet Take 100 mg by mouth every morning.   . metFORMIN (GLUCOPHAGE) 500 MG tablet Take 500 mg by mouth 2 (two) times daily.   . nitroGLYCERIN (NITROSTAT) 0.4 MG SL tablet Place 1 tablet (0.4 mg total) under the tongue every 5 (five) minutes as needed for chest pain.  Marland Kitchen omeprazole (PRILOSEC) 20 MG capsule Take 20 mg by mouth every morning.   . ticagrelor (BRILINTA) 90 MG TABS tablet Take 1 tablet (90 mg total) by mouth 2 (two) times daily.     Allergies:   Bee venom; Penicillins; and Plavix [clopidogrel bisulfate]   Social History   Tobacco Use  . Smoking status: Former Smoker    Last attempt to quit: 06/19/2003    Years since quitting: 15.3  . Smokeless tobacco: Never Used  Substance Use Topics  . Alcohol use: No  . Drug use: No     Family Hx: The patient's family history includes Cancer in his brother and mother; Heart attack (age of onset: 23) in his father.  ROS:  Please see the history of present illness.     All other systems reviewed and are negative.   Prior CV studies:   The following studies were reviewed today:  Reviewed most recent lower extremity arterial duplex with him  Labs/Other Tests and Data Reviewed:    EKG:  No ECG reviewed.  Recent Labs: 07/20/2018: ALT 22; B Natriuretic Peptide 116.0 07/22/2018: Hemoglobin 11.7; Magnesium 2.3; Platelets 183 07/29/2018: BUN 22; Creatinine, Ser 0.90; Potassium 4.3; Sodium 142   Recent Lipid Panel Lab Results  Component Value Date/Time   CHOL 132 05/30/2010 09:38 AM   TRIG 790 (H) 05/30/2010 09:38 AM   HDL 24 (L) 05/30/2010 09:38 AM   CHOLHDL 5.5 Ratio 05/30/2010 09:38 AM   LDLCALC See Comment mg/dL 40/98/1191 47:82 AM    Wt Readings from Last 3 Encounters:  11/04/18 200 lb (90.7 kg)  08/27/18 200 lb (90.7 kg)  08/05/18 201 lb 6.4 oz (91.4 kg)     Objective:    Vital Signs:  BP 129/66   Pulse 86   Ht 5' 11.5" (1.816 m)   Wt 200 lb (90.7 kg)    BMI 27.51 kg/m    VITAL SIGNS:  reviewed  ASSESSMENT & PLAN:    1.  Peripheral arterial disease with moderate -severe bilateral calf claudication : This is due to bilateral SFA occlusion.  I recommend continuing medical therapy and reserving revascularization for worsening symptoms or critical limb ischemia.  He is on dual antiplatelet therapy with aspirin and Brilinta.  We can consider adding cilostazol once he is off Brilinta.  2.  Coronary artery disease involving native coronary arteries: Currently with no anginal symptoms.  Continue medical therapy.  3.  Hyperlipidemia: On high-dose atorvastatin 80 and fenofibrate for severely elevated triglyceride.  4.  Essential hypertension: Blood pressure is reasonably controlled on current medications.   COVID-19 Education: The signs and symptoms of COVID-19 were discussed with the patient and how to seek care for testing (follow up with PCP or arrange E-visit).  The importance of social distancing was discussed today.  Time:   Today, I have spent 10 minutes with the patient with telehealth technology discussing the above problems.     Medication Adjustments/Labs and Tests Ordered: Current medicines are reviewed at length with the patient today.  Concerns regarding medicines are outlined above.   Tests Ordered: No orders of the defined types were placed in this encounter.   Medication Changes: No orders of the defined types were placed in this encounter.   Disposition:  Follow up in 6 month(s)  Signed, Lorine Bears, MD  11/04/2018 9:43 AM    [facility]

## 2018-11-10 ENCOUNTER — Other Ambulatory Visit: Payer: Self-pay | Admitting: Cardiology

## 2018-11-14 ENCOUNTER — Telehealth: Payer: Self-pay | Admitting: Cardiology

## 2018-11-14 NOTE — Telephone Encounter (Signed)
New Message   Patient calling the office for samples of medication:   1.  What medication and dosage are you requesting samples for?  Brilinta 90mg  1 tablet by mouth twice a day  2.  Are you currently out of this medication?   Patient has enough to last until Monday 11/17/18

## 2018-11-14 NOTE — Telephone Encounter (Signed)
Informed pt that samples will be placed up at the front desk for him to pick up.  Brilinta 90 mg Qty: 4 bottle Lot # [id number] Exp: 10/22

## 2018-12-31 ENCOUNTER — Telehealth: Payer: Self-pay | Admitting: Cardiology

## 2018-12-31 NOTE — Telephone Encounter (Signed)
  Patient calling the office for samples of medication:   1.  What medication and dosage are you requesting samples for? BRILINTA 90 MG TABS tablet  2.  Are you currently out of this medication? Has a couple days left

## 2018-12-31 NOTE — Telephone Encounter (Signed)
Spoke with pt, aware no samples available at this time. 

## 2019-01-13 ENCOUNTER — Other Ambulatory Visit: Payer: Self-pay | Admitting: Cardiology

## 2019-01-13 ENCOUNTER — Telehealth: Payer: Self-pay | Admitting: Cardiology

## 2019-01-13 NOTE — Telephone Encounter (Signed)
Left a message for the patient to call back.  

## 2019-01-13 NOTE — Telephone Encounter (Signed)
Patient calling the office for samples of medication:   1.  What medication and dosage are you requesting samples for? Brilliant 90 mg  2.  Are you currently out of this medication? Patient has two left.

## 2019-01-13 NOTE — Telephone Encounter (Signed)
Follow up: ° ° ° °Patient returning your call back.  °

## 2019-01-13 NOTE — Telephone Encounter (Signed)
Duplicate encounter

## 2019-01-14 ENCOUNTER — Telehealth: Payer: Self-pay | Admitting: Cardiology

## 2019-01-14 NOTE — Telephone Encounter (Signed)
New message   Patient calling the office for samples of medication:   1.  What medication and dosage are you requesting samples for?BRILINTA 90 MG TABS tablet  2.  Are you currently out of this medication? No, patient has one dose left

## 2019-01-15 ENCOUNTER — Telehealth: Payer: Self-pay | Admitting: Cardiology

## 2019-01-15 NOTE — Telephone Encounter (Signed)
Returned call to patient's wife advised office out of Brilinta 90 mg samples.Offered to send a refill to pharmacy.Stated she will have husband call us back.

## 2019-01-15 NOTE — Telephone Encounter (Signed)
Follow Up:    Pt is returning your call from this morning. 

## 2019-01-15 NOTE — Telephone Encounter (Signed)
Spoke to patient advised office out of Brilinta samples.

## 2019-01-15 NOTE — Telephone Encounter (Signed)
Returned call to patient advised office out of Brilinta samples.

## 2019-01-17 ENCOUNTER — Other Ambulatory Visit: Payer: Self-pay | Admitting: Physician Assistant

## 2019-02-18 ENCOUNTER — Telehealth: Payer: Self-pay | Admitting: Cardiology

## 2019-02-18 NOTE — Telephone Encounter (Signed)
Pt notified samples at the front desk for p/u

## 2019-02-18 NOTE — Telephone Encounter (Signed)
New message   Patient calling the office for samples of medication:   1.  What medication and dosage are you requesting samples for?BRILINTA 90 MG TABS tablet  2.  Are you currently out of this medication? No, 1 day supply left

## 2019-02-26 ENCOUNTER — Other Ambulatory Visit: Payer: Self-pay | Admitting: Cardiology

## 2019-03-31 ENCOUNTER — Telehealth: Payer: Self-pay | Admitting: Cardiology

## 2019-03-31 NOTE — Telephone Encounter (Signed)
Informed pt samples are available for pick up at the front desk.   Brilinta 90 mg Qty: 2 bottles Lot # [id number] Exp: 1/23

## 2019-03-31 NOTE — Telephone Encounter (Signed)
New message   Patient calling the office for samples of medication:   1.  What medication and dosage are you requesting samples for?BRILINTA 90 MG TABS tablet  2.  Are you currently out of this medication? No

## 2019-04-21 NOTE — Progress Notes (Signed)
HPI The patient presents for follow up of his CAD.  He was admitted Sept 2015 with non-ST elevation MI. He was found to have an occluded right coronary artery but patent LAD stents. He was managed medically.   Since I last saw him he was in the hospital in April.  He had pneumonia.  He had C. difficile.  He also had non-Q wave MI with disease as listed below and ended up with staged PCI.  Ejection fraction was 45% which is essentially unchanged from previous.  He saw Dr. Kirke Corin recently with leg pain and PVD.    In January he did have increasing shortness of breath and some chest discomfort.  He was diagnosed with NSTEMI.   He underwent PCI as below by Dr. Okey Dupre.    Since I last saw him he has done well.  He was just out mowing the lawn and has not had any new symptoms.  He denies chest pressure, neck or arm discomfort.  He had no palpitations, presyncope or syncope.  He said no shortness of breath, PND or orthopnea.  He has leg pain if he gets to moving quickly.  He works in Production designer, theatre/television/film at [employer].  Allergies  Allergen Reactions  . Bee Venom Swelling  . Penicillins Itching    Did it involve swelling of the face/tongue/throat, SOB, or low BP? Yes-swelling Did it involve sudden or severe rash/hives, skin peeling, or any reaction on the inside of your mouth or nose? No-itching Did you need to seek medical attention at a hospital or doctor's office? Yes When did it last happen? Over 10 years  If all above answers are "NO", may proceed with cephalosporin use.   Marland Kitchen Plavix [Clopidogrel Bisulfate] Other (See Comments)    Patient is noted to be a nonresponder to Plavix    Current Outpatient Medications  Medication Sig Dispense Refill  . allopurinol (ZYLOPRIM) 100 MG tablet Take 100 mg by mouth every morning.     Marland Kitchen amLODipine (NORVASC) 10 MG tablet Take 1 tablet (10 mg total) by mouth daily. (Patient taking differently: Take 10 mg by mouth every morning. ) 90 tablet 3  . aspirin 81 MG tablet  Take 81 mg by mouth every morning.     Marland Kitchen atorvastatin (LIPITOR) 80 MG tablet TAKE 1 TABLET BY MOUTH ONCE DAILY 30 tablet 5  . BRILINTA 90 MG TABS tablet TAKE 1 TABLET (90 MG TOTAL) BY MOUTH 2 (TWO) TIMES DAILY. 60 tablet 14  . carvedilol (COREG) 25 MG tablet TAKE 1 TABLET BY MOUTH TWO TIMES DAILY WITH A MEAL 60 tablet 5  . fenofibrate (TRICOR) 145 MG tablet TAKE 1 TABLET BY MOUTH EVERY DAY 30 tablet 11  . hydrochlorothiazide (MICROZIDE) 12.5 MG capsule Take 12.5 mg by mouth every morning.     . insulin NPH-regular Human (NOVOLIN 70/30) (70-30) 100 UNIT/ML injection Inject 48 Units into the skin 2 (two) times daily.    . isosorbide mononitrate (IMDUR) 120 MG 24 hr tablet Take 1 tablet (120 mg total) by mouth daily. *NEEDS OFFICE VISIT FOR FURTHER REFILLS* 90 tablet 0  . losartan (COZAAR) 100 MG tablet Take 100 mg by mouth every morning.     . metFORMIN (GLUCOPHAGE) 500 MG tablet Take 500 mg by mouth 2 (two) times daily.     . nitroGLYCERIN (NITROSTAT) 0.4 MG SL tablet Place 1 tablet (0.4 mg total) under the tongue every 5 (five) minutes as needed for chest pain. 25 tablet 11  . omeprazole (  PRILOSEC) 20 MG capsule Take 20 mg by mouth every morning.      No current facility-administered medications for this visit.     Past Medical History:  Diagnosis Date  . CAP (community acquired pneumonia) 10/11/2017  . Coronary artery disease    a. Cath: 2000 nonobs dz. b. 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 s/p BMS to LAD & RCA ; s/p PCI/DES dRCA & PDA x2 2011. d. cath 02/17/14: LM patent, LAD calcified, diffuse irregs, no high grade stenosis mLAD stent patent, LCx, diffuse irregs no high-grade dz, RCA total occ w/ L to R collats, medical Rx rec  . Diabetes mellitus   . Diastolic dysfunction    a. echo 02/18/14: EF 45-50%, mild LVH, AK of inferior and inferoseptal myocardium, GR1DD  . Hiatal hernia   . Hyperlipidemia   . LV dysfunction   . NSTEMI (non-ST elevated myocardial  infarction) (HCC) 10/07/2017  . PVD (peripheral vascular disease) (HCC)    ABIs of 0.69 on right and 0.93 on left  . Renal artery stenosis (HCC)    Mild, bilateral  . S/P angioplasty with stent, atherectomy DES to pLAD and DES to [facility] 10/10/17  10/11/2017  . Sleep apnea    Mild    Past Surgical History:  Procedure Laterality Date  . CATARACT EXTRACTION    . CORONARY ATHERECTOMY N/A 10/10/2017   Procedure: CORONARY ATHERECTOMY - CSI;  Surgeon: Yvonne Kendall, MD;  Location: [facility] INVASIVE CV LAB;  Service: Cardiovascular;  Laterality: N/A;  . CORONARY STENT INTERVENTION N/A 10/10/2017   Procedure: CORONARY STENT INTERVENTION;  Surgeon: Yvonne Kendall, MD;  Location: [facility] INVASIVE CV LAB;  Service: Cardiovascular;  Laterality: N/A;  . CORONARY STENT INTERVENTION N/A 07/21/2018   Procedure: CORONARY STENT INTERVENTION;  Surgeon: Yvonne Kendall, MD;  Location: [facility] INVASIVE CV LAB;  Service: Cardiovascular;  Laterality: N/A;  . HIATAL HERNIA REPAIR    . INTRAVASCULAR ULTRASOUND/IVUS N/A 10/09/2017   Procedure: Intravascular Ultrasound/IVUS;  Surgeon: [name], MD;  Location: [facility] INVASIVE CV LAB;  Service: Cardiovascular;  Laterality: N/A;  . INTRAVASCULAR ULTRASOUND/IVUS N/A 07/21/2018   Procedure: Intravascular Ultrasound/IVUS;  Surgeon: Yvonne Kendall, MD;  Location: [facility] INVASIVE CV LAB;  Service: Cardiovascular;  Laterality: N/A;  . LEFT HEART CATH AND CORONARY ANGIOGRAPHY N/A 10/09/2017   Procedure: LEFT HEART CATH AND CORONARY ANGIOGRAPHY;  Surgeon: [name], MD;  Location: [facility] INVASIVE CV LAB;  Service: Cardiovascular;  Laterality: N/A;  . LEFT HEART CATH AND CORONARY ANGIOGRAPHY N/A 07/21/2018   Procedure: LEFT HEART CATH AND CORONARY ANGIOGRAPHY;  Surgeon: Yvonne Kendall, MD;  Location: [facility] INVASIVE CV LAB;  Service: Cardiovascular;  Laterality: N/A;  . LEFT HEART CATHETERIZATION WITH CORONARY ANGIOGRAM N/A 02/17/2014   Procedure: LEFT HEART CATHETERIZATION WITH CORONARY ANGIOGRAM;   Surgeon: Micheline Chapman, MD;  Location: [facility] CATH LAB;  Service: Cardiovascular;  Laterality: N/A;  . SHOULDER ARTHROSCOPY     Right  . 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 SOFT TISSUE TUMOR RESECTION     Benign neck tumor  . Vocal cord polyp removal      ROS:    Positive for none.  Otherwise as stated in the HPI and negative for all other systems.   PHYSICAL EXAM BP 140/68   Pulse 100   Ht 5\' 10"  (1.778 m)   Wt 201 lb (91.2 kg)   BMI 28.84 kg/m   GENERAL:  Well appearing NECK:  No jugular venous distention, waveform within normal limits, carotid upstroke brisk and symmetric, no bruits, no thyromegaly LUNGS:  Clear to auscultation bilaterally CHEST:  Unremarkable HEART:  PMI not displaced or sustained,S1 and S2 within normal limits, no S3, no S4, no clicks, no rubs, no murmurs ABD:  Flat, positive bowel sounds normal in frequency in pitch, no bruits, no rebound, no guarding, no midline pulsatile mass, no hepatomegaly, no splenomegaly EXT:  2 plus pulses throughout, no edema, no cyanosis no clubbing   EKG:  NA  Cardiac cath 07/21/18:  Diagnostic  Dominance: Right    Intervention      Conclusions: 1. 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 2. Successful PCI to mid/distal LAD using Resolute Onyx 2.0 x 18 mm DES (post-dilated to 2.6 mm). 3. 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   ASSESSMENT AND PLAN  CAD:    He has no new cardiovascular symptoms.  He needs DAPT until February.  No change in therapy.   HTN:   The blood pressure is at target.  No change in therapy.   DM:    His blood sugar is 7.3 which is better than previous.  He does see an endocrinologist.   PVD:    He has bilateral SFA occlusion and he is being managed medically.  I reviewed the records by Dr. Fletcher Anon and I will manage this conservatively.  We talked about good foot hygiene.   DYSLIPIDEMIA:   LDL was 24 with an HDL of 31.  He will continue the meds as listed.   BRUIT:  He had mild carotid plaque in 2019.   No change in therapy.

## 2019-04-22 ENCOUNTER — Encounter: Payer: Self-pay | Admitting: Cardiology

## 2019-04-22 ENCOUNTER — Other Ambulatory Visit: Payer: Self-pay

## 2019-04-22 ENCOUNTER — Ambulatory Visit (INDEPENDENT_AMBULATORY_CARE_PROVIDER_SITE_OTHER): Payer: Medicare Other | Admitting: Cardiology

## 2019-04-22 VITALS — BP 140/68 | HR 100 | Ht 70.0 in | Wt 201.0 lb

## 2019-04-22 DIAGNOSIS — I2510 Atherosclerotic heart disease of native coronary artery without angina pectoris: Secondary | ICD-10-CM | POA: Diagnosis not present

## 2019-04-22 DIAGNOSIS — E785 Hyperlipidemia, unspecified: Secondary | ICD-10-CM

## 2019-04-22 DIAGNOSIS — E119 Type 2 diabetes mellitus without complications: Secondary | ICD-10-CM

## 2019-04-22 DIAGNOSIS — I739 Peripheral vascular disease, unspecified: Secondary | ICD-10-CM | POA: Diagnosis not present

## 2019-04-22 DIAGNOSIS — I10 Essential (primary) hypertension: Secondary | ICD-10-CM | POA: Diagnosis not present

## 2019-04-22 NOTE — Patient Instructions (Signed)
Medication Instructions:  The current medical regimen is effective;  continue present plan and medications.  *If you need a refill on your cardiac medications before your next appointment, please call your pharmacy*  Follow-Up: Follow up in February 2021 in [place] with Dr Percival Spanish.  Thank you for choosing [facility]!!

## 2019-05-01 ENCOUNTER — Telehealth: Payer: Self-pay | Admitting: Cardiology

## 2019-05-01 NOTE — Telephone Encounter (Signed)
Patient calling the office for samples of medication:   1.  What medication and dosage are you requesting samples for? BRILINTA 90 MG TABS tablet  2.  Are you currently out of this medication? Has about 2 days left.

## 2019-05-01 NOTE — Telephone Encounter (Signed)
Advised patient no samples at this time 

## 2019-05-07 IMAGING — CT CT ANGIO CHEST
2 of 6 series · 18 of 46 positions shown · IV contrast (Isovue)
Comparison: Chest radiograph performed earlier today at [DATE] a.m.

CLINICAL DATA: Acute onset of shortness of breath with exertion.
Elevated D-dimer.

EXAM:
CT ANGIOGRAPHY CHEST WITH CONTRAST
TECHNIQUE: Multidetector CT imaging of the chest was performed using the
standard protocol during bolus administration of intravenous
contrast. Multiplanar CT image reconstructions and MIPs were
obtained to evaluate the vascular anatomy.
CONTRAST:  100mL [id number] IOPAMIDOL ([id number]) INJECTION 76%

[Series 5: thins · axial · 0.72mm/px · z∈[+1087,+1368]mm · 15 of 309 slices shown]
[im 14/309  lung]
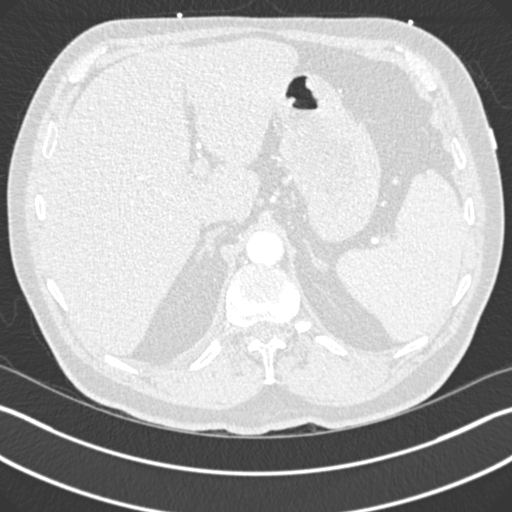
[im 41/309  soft-tissue]
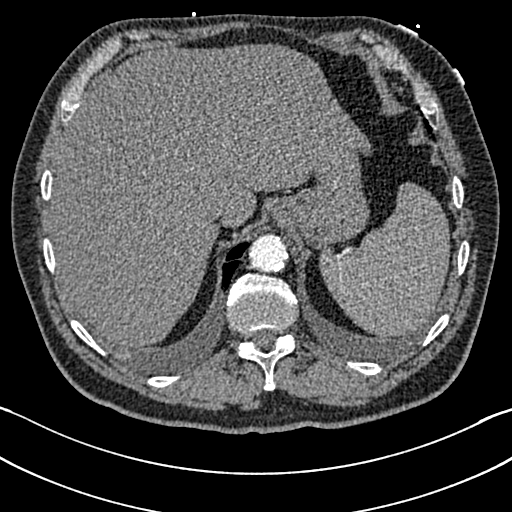
[im 54/309  lung]
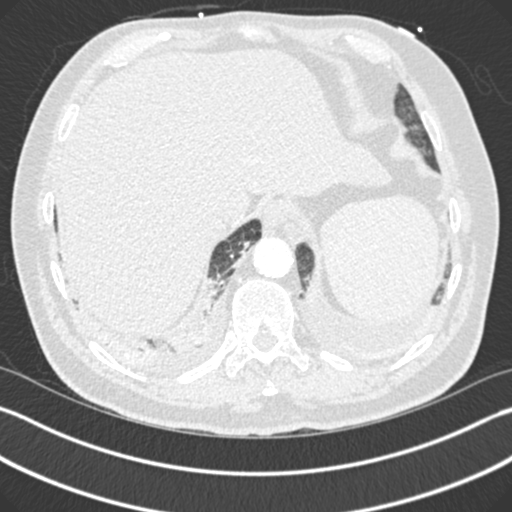
[im 81/309  soft-tissue]
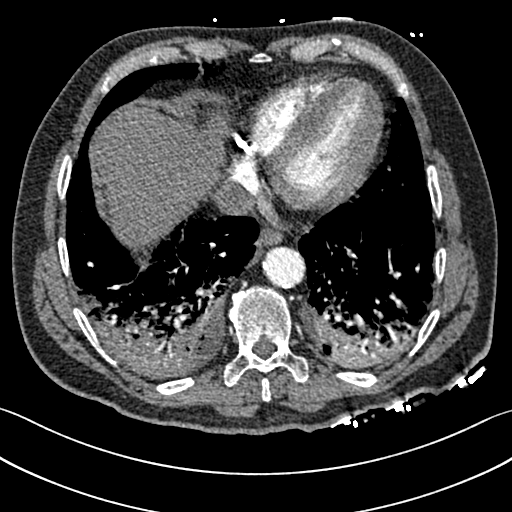
[im 94/309  lung]
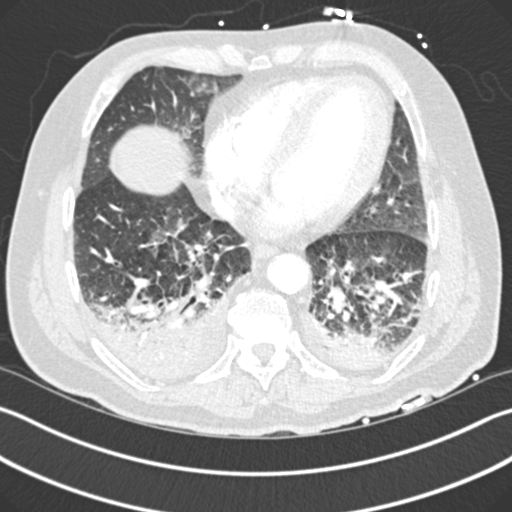
[im 121/309  soft-tissue]
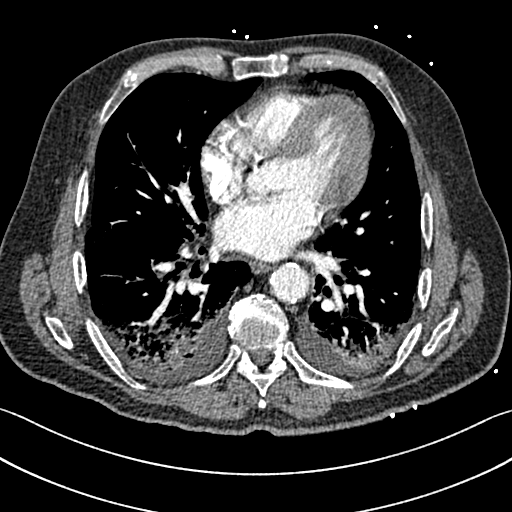
[im 134/309  lung]
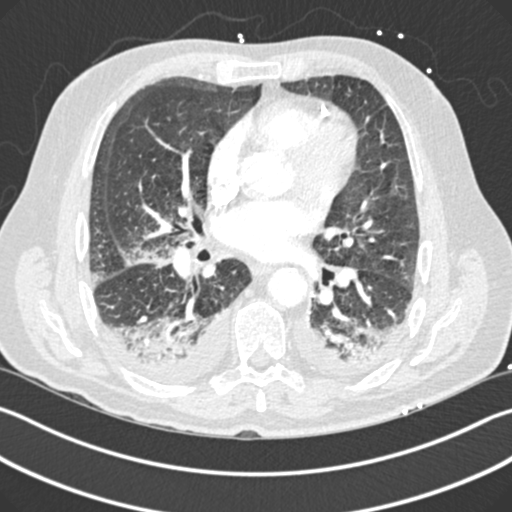
[im 161/309  soft-tissue]
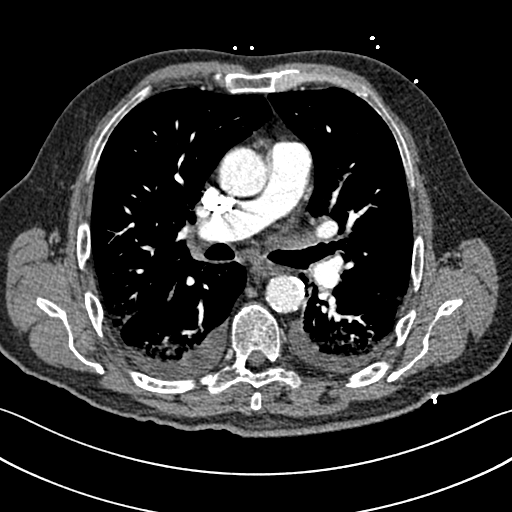
[im 175/309  lung]
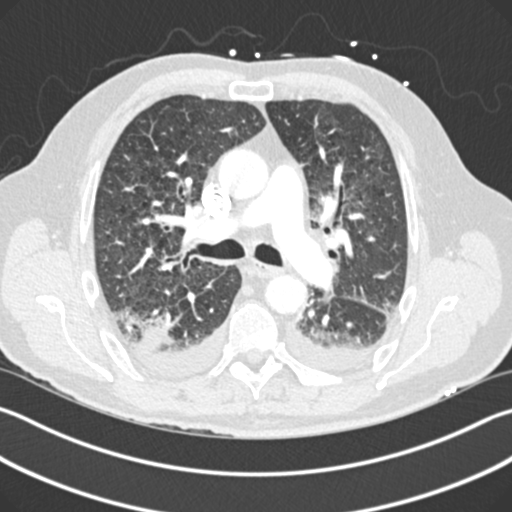
[im 188/309  soft-tissue]
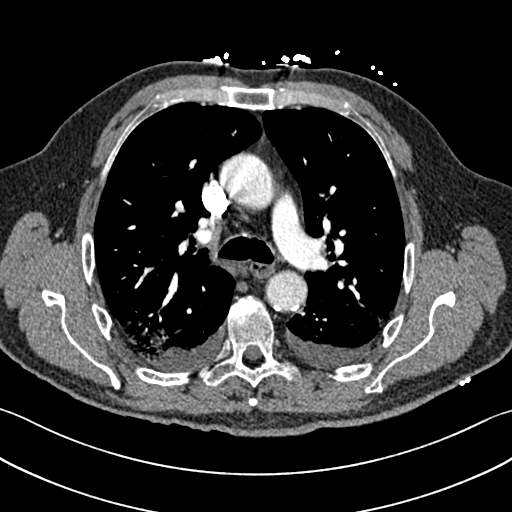
[im 215/309  lung]
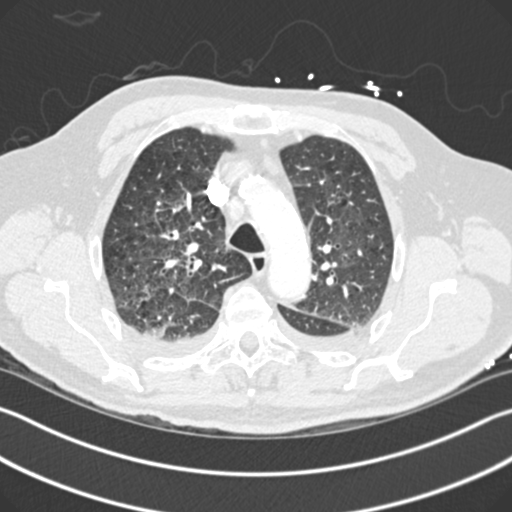
[im 228/309  soft-tissue]
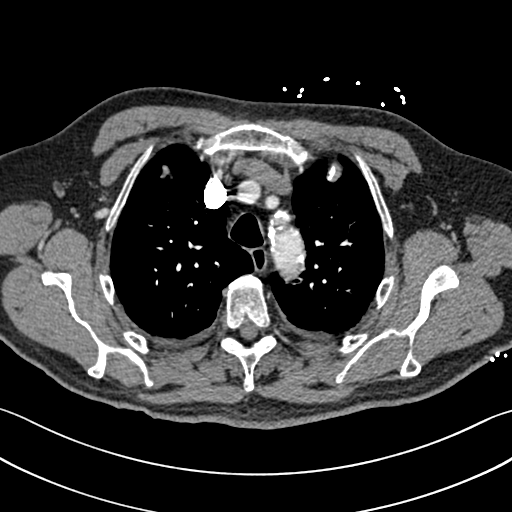
[im 255/309  lung]
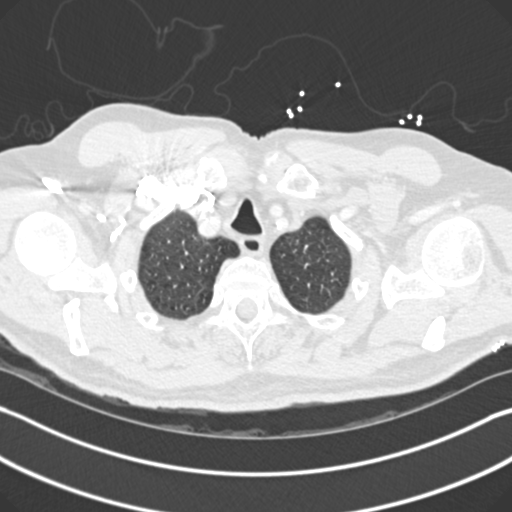
[im 268/309  soft-tissue]
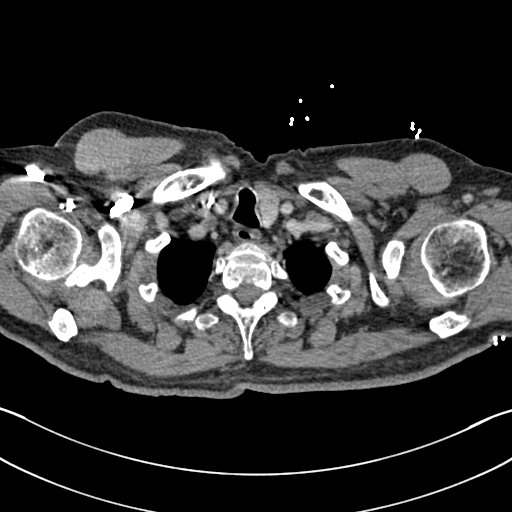
[im 295/309  lung]
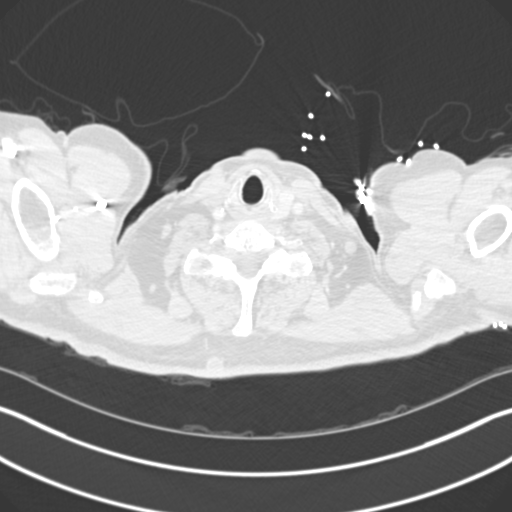

[Series 7: coronal mpr · coronal · 0.65mm/px · 3 of 162 slices shown]
[im 41/162  soft-tissue]
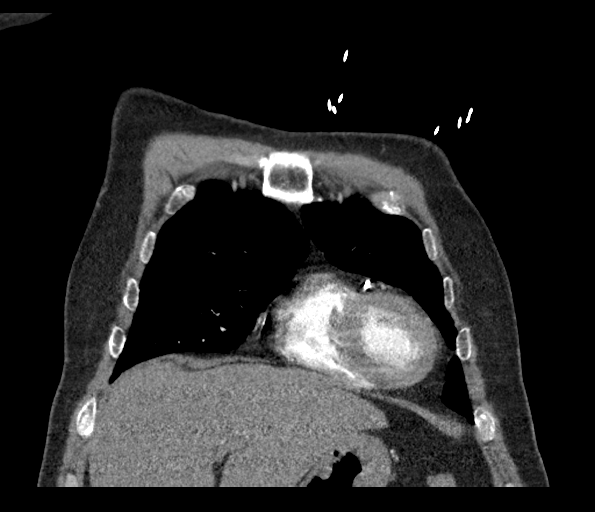
[im 81/162  soft-tissue]
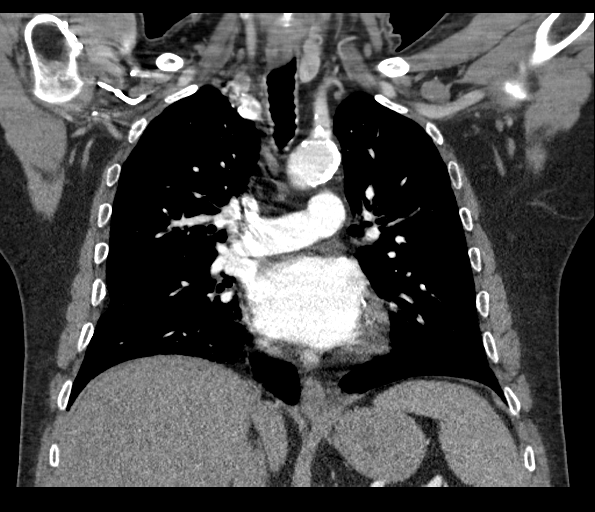
[im 121/162  soft-tissue]
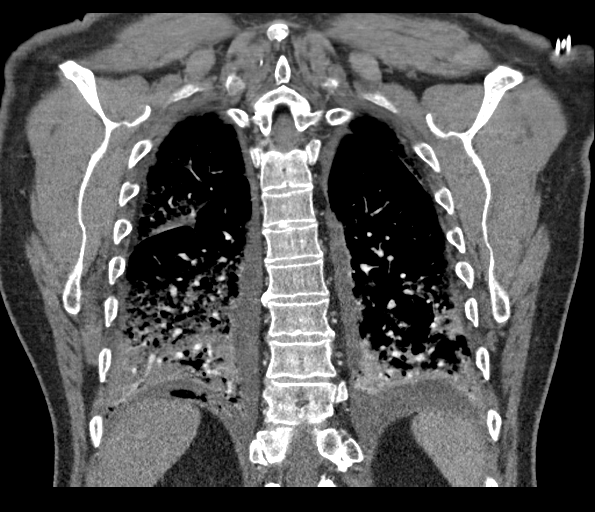

[18 of 46 positions shown; findings below may reference images not displayed]

FINDINGS: Cardiovascular:  There is no evidence of pulmonary embolus.

The heart is normal in size. Diffuse coronary artery calcifications
are seen. Scattered calcification is noted along the thoracic aorta
and proximal great vessels.

Mediastinum/Nodes: Visualized mediastinal nodes are borderline
normal in size. No pericardial effusion is identified. The right
thyroid lobe is absent. The left thyroid lobe is unremarkable in
appearance. No axillary lymphadenopathy is seen.

Lungs/Pleura: Small bilateral pleural effusions are noted. Partial
consolidation of both lower lobes is concerning for pneumonia. Mild
additional airspace opacities are noted bilaterally. Underlying
interstitial prominence raises concern for mild interstitial edema.
Mild bilateral emphysema is noted.

There is no evidence of pneumothorax.  No dominant mass is seen.

Upper Abdomen: The visualized portions of the liver and spleen are
unremarkable.

Musculoskeletal: No acute osseous abnormalities are identified. The
visualized musculature is unremarkable in appearance.

Review of the MIP images confirms the above findings.
IMPRESSION: 1. No evidence of pulmonary embolus.
2. Small bilateral pleural effusions. Partial consolidation of both
lower lung lobes is concerning for pneumonia. Mild additional
airspace opacities noted bilaterally.
3. Underlying interstitial prominence raises concern for mild
interstitial edema.
4. Mild bilateral emphysema.
5. Diffuse coronary artery calcifications.

## 2019-05-07 IMAGING — CR DG CHEST 1V PORT
1 series · 1 of 1 positions shown · non-contrast
Comparison: Chest radiograph performed 02/16/2014

CLINICAL DATA: Acute onset of shortness of breath.

EXAM:
PORTABLE CHEST 1 VIEW

[portable]
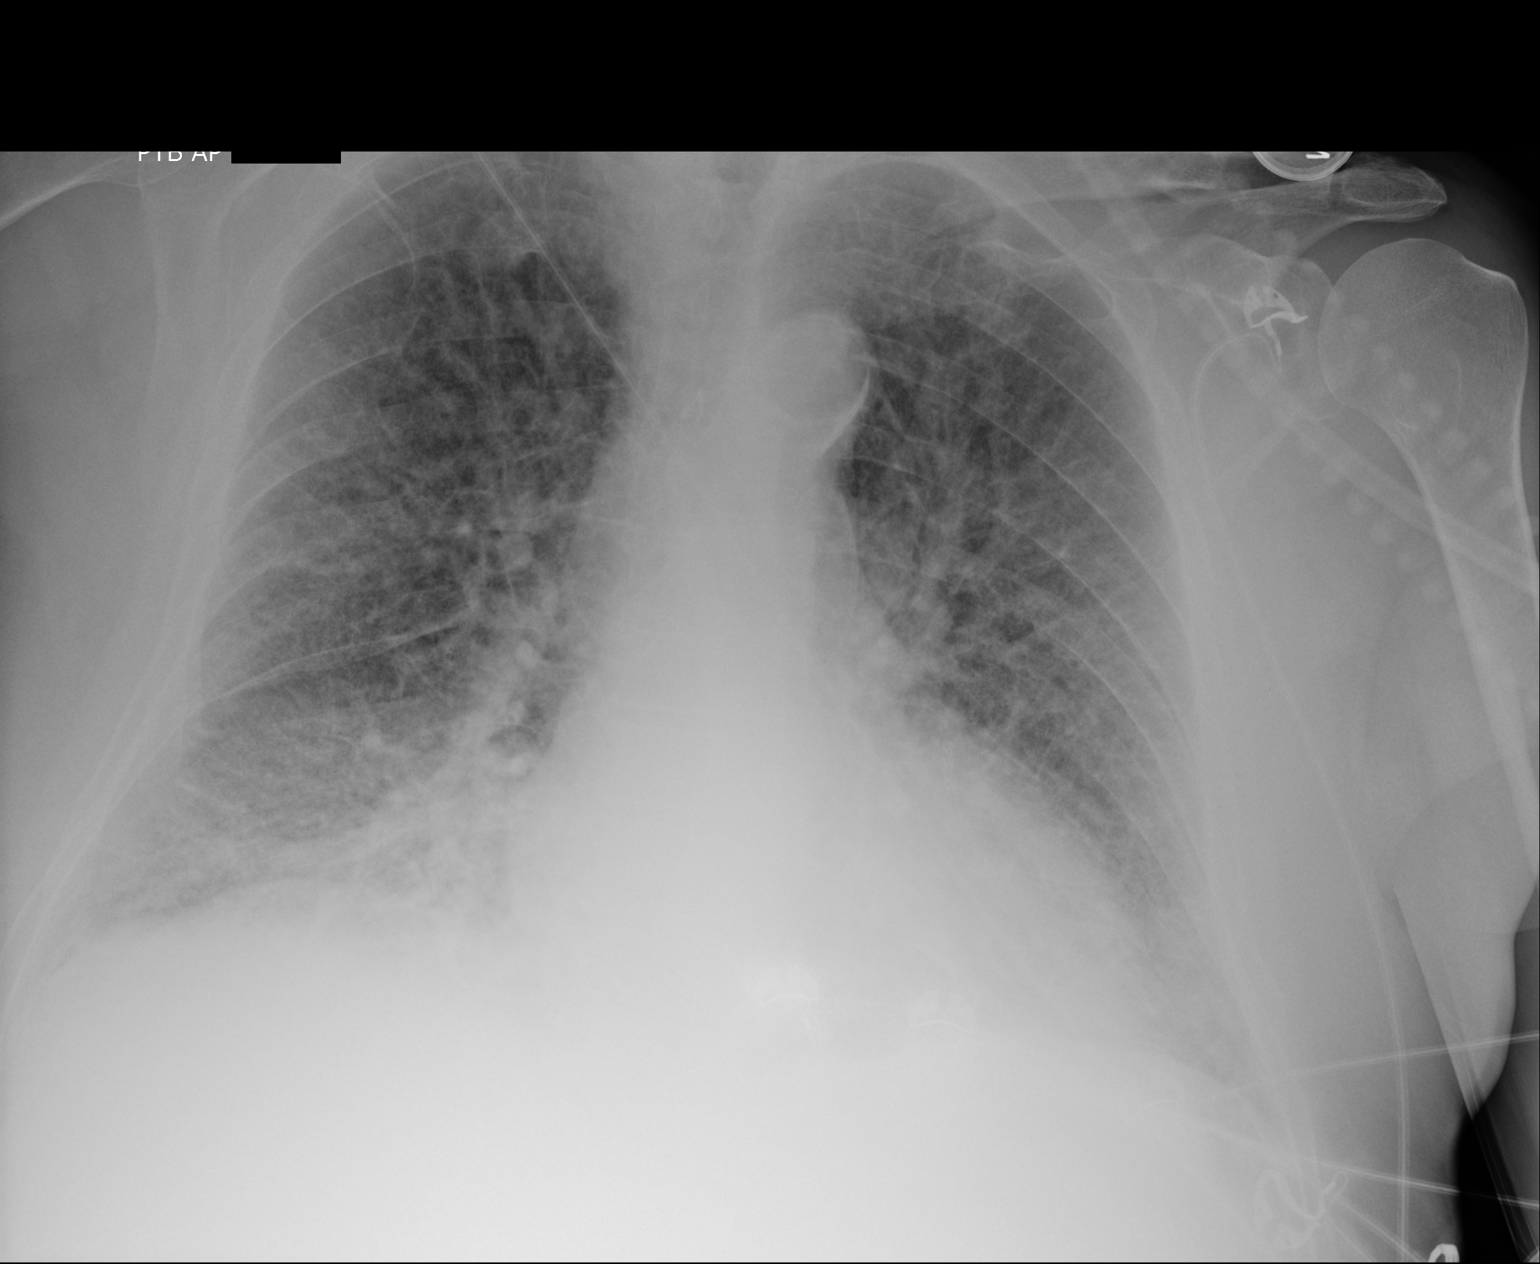

[1 of 1 positions shown; findings below may reference images not displayed]

FINDINGS: The lungs are well-aerated. Vascular congestion is noted. Diffuse
increased interstitial markings raise concern for pulmonary edema.
There is no evidence of pleural effusion or pneumothorax.

The cardiomediastinal silhouette is within normal limits. No acute
osseous abnormalities are seen.
IMPRESSION: Vascular congestion. Diffusely increased interstitial markings raise
concern for pulmonary edema.

## 2019-05-11 ENCOUNTER — Telehealth: Payer: Self-pay | Admitting: Cardiology

## 2019-05-11 NOTE — Telephone Encounter (Signed)
Patient aware samples are at the front desk for pick up  

## 2019-05-11 NOTE — Telephone Encounter (Signed)
.  Patient calling the office for samples of medication:   1.  What medication and dosage are you requesting samples for? Brilinta  2.  Are you currently out of this medication? Enough for 2 days

## 2019-05-28 ENCOUNTER — Other Ambulatory Visit: Payer: Self-pay | Admitting: Cardiology

## 2019-06-05 NOTE — Telephone Encounter (Signed)
Forward to Dr Percival Spanish and Sherian Rein D for review

## 2019-06-06 ENCOUNTER — Other Ambulatory Visit: Payer: Self-pay | Admitting: Cardiology

## 2019-06-08 NOTE — Telephone Encounter (Addendum)
If fenofibrate 145mg  is on backorder, ok to change to fenofibrate 160mg  daily and make pt aware of dose change - this dose is equally as effective.

## 2019-06-22 ENCOUNTER — Telehealth: Payer: Self-pay | Admitting: Cardiology

## 2019-06-22 NOTE — Telephone Encounter (Signed)
Pt aware samples left at front desk for pick up ./cy 

## 2019-06-22 NOTE — Telephone Encounter (Signed)
Patient calling the office for samples of medication:   1.  What medication and dosage are you requesting samples for? BRILINTA 90 MG TABS tablet  2.  Are you currently out of this medication? Has one left. Co-pay is $400. Has an appt with Dr. Antoine Poche on 08/12/19 and states that Dr. Antoine Poche will probably take him off this medication.

## 2019-06-24 ENCOUNTER — Telehealth: Payer: Self-pay | Admitting: Cardiology

## 2019-06-24 NOTE — Telephone Encounter (Signed)
OK to hold the fenofibrate and he can have a lipid profile fasting when he comes back to see me.

## 2019-06-24 NOTE — Telephone Encounter (Signed)
Patient states he has been out of his Fenofibrate for 2 months. He says his pharmacy told him they could not get the 145mg  from the manufacturer. He wants to know if he still needs this medication and if so, should he get a new prescription that will allow him to get the medication.

## 2019-06-24 NOTE — Telephone Encounter (Signed)
Pt says that his Fenofibrate has been on backorder with the pharmacy and he has been off off of it for 2 months he is not sure what to do next...   Pt is seeing Dr. Antoine Poche 08/12/19 and is asking if we can order fasting Lipids prior to his appt and he can talk with Dr. Antoine Poche further at his appt with the results about another med that will be easier for him to get.   Will forward to Dr. Antoine Poche for approval to order labs... Lipid and ? Hepatic.... last labs not noted in Epic.

## 2019-06-25 NOTE — Addendum Note (Signed)
Addended by: Regis Bill B on: 06/25/2019 06:26 PM   Modules accepted: Orders

## 2019-06-25 NOTE — Telephone Encounter (Signed)
Advised patient, he will have fasting labs at follow up visit

## 2019-06-29 NOTE — Telephone Encounter (Signed)
Patient holding until follow up visit per Dr Antoine Poche

## 2019-07-20 ENCOUNTER — Telehealth: Payer: Self-pay | Admitting: Cardiology

## 2019-07-20 NOTE — Telephone Encounter (Signed)
Patient calling the office for samples of medication:   1.  What medication and dosage are you requesting samples for? Brilinta  2.  Are you currently out of this medication? Last one today

## 2019-07-20 NOTE — Telephone Encounter (Signed)
Called pt and let him know we have samples of Brilinta at the front desk for him. Verbalized understanding.

## 2019-08-07 ENCOUNTER — Telehealth: Payer: Self-pay | Admitting: Cardiology

## 2019-08-07 NOTE — Telephone Encounter (Signed)
Medication samples have been provided to the patient.  Drug name: Brilinta 90mg  Qty: 2 bottles  LOT:  Exp.Date: 09/2021  Samples left at front desk for patient pick-up. Patient notified.  10/2021 12:13 PM 08/07/2019   He states MD may be taking him off Brilinta so he didn't want to purchase a month's supply for $400

## 2019-08-07 NOTE — Telephone Encounter (Signed)
Pt called requesting BRILINTA  90 mg samples.  Please give him a call back.

## 2019-08-10 DIAGNOSIS — Z7189 Other specified counseling: Secondary | ICD-10-CM | POA: Insufficient documentation

## 2019-08-10 NOTE — Progress Notes (Signed)
Cardiology Office Note   Date:  08/12/2019   ID:  [name] [name], DOB Apr 29, 1946, MRN [id number]  PCP:  Alferd Apa, MD  Cardiologist:   Rollene Rotunda, MD   Chief Complaint  Patient presents with  . Coronary Artery Disease      History of Present Illness: [name] [name] is a 74 y.o. male who presents for for follow up of his CAD.  He was admitted Sept 2015 with non-ST elevation MI. He was found to have an occluded right coronary artery but patent LAD stents. He was managed medically.   He was in the hospital in April.  He had pneumonia.  He had C. difficile.  He also had non-Q wave MI with disease as listed below and ended up with staged PCI.  Ejection fraction was 45% which is essentially unchanged from previous.  He saw Dr. Kirke Corin with leg pain and PVD.    Feb 2020 he did have increasing shortness of breath and some chest discomfort.  He was diagnosed with NSTEMI.   He underwent PCI as below by Dr. Okey Dupre.    Since I last saw him he has done well from a cardiovascular standpoint. The patient denies any new symptoms such as chest discomfort, neck or arm discomfort. There has been no new shortness of breath, PND or orthopnea. There have been no reported palpitations, presyncope or syncope.  He does have some leg pain when he walks which has been chronic.  He has some chronic dyspnea with exertion but none of the shortness of breath that he had last year.  Past Medical History:  Diagnosis Date  . CAP (community acquired pneumonia) 10/11/2017  . Coronary artery disease    a. Cath: 2000 nonobs dz. b. 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 s/p BMS to LAD & RCA ; s/p PCI/DES dRCA & PDA x2 2011. d. cath 02/17/14: LM patent, LAD calcified, diffuse irregs, no high grade stenosis mLAD stent patent, LCx, diffuse irregs no high-grade dz, RCA total occ w/ L to R collats, medical Rx rec  . Diabetes mellitus   . Diastolic dysfunction    a. echo 02/18/14: EF 45-50%, mild  LVH, AK of inferior and inferoseptal myocardium, GR1DD  . Hiatal hernia   . Hyperlipidemia   . LV dysfunction   . NSTEMI (non-ST elevated myocardial infarction) (HCC) 10/07/2017  . PVD (peripheral vascular disease) (HCC)    ABIs of 0.69 on right and 0.93 on left  . Renal artery stenosis (HCC)    Mild, bilateral  . S/P angioplasty with stent, atherectomy DES to pLAD and DES to [facility] 10/10/17  10/11/2017  . Sleep apnea    Mild    Past Surgical History:  Procedure Laterality Date  . CATARACT EXTRACTION    . CORONARY ATHERECTOMY N/A 10/10/2017   Procedure: CORONARY ATHERECTOMY - CSI;  Surgeon: Yvonne Kendall, MD;  Location: [facility] INVASIVE CV LAB;  Service: Cardiovascular;  Laterality: N/A;  . CORONARY STENT INTERVENTION N/A 10/10/2017   Procedure: CORONARY STENT INTERVENTION;  Surgeon: Yvonne Kendall, MD;  Location: [facility] INVASIVE CV LAB;  Service: Cardiovascular;  Laterality: N/A;  . CORONARY STENT INTERVENTION N/A 07/21/2018   Procedure: CORONARY STENT INTERVENTION;  Surgeon: Yvonne Kendall, MD;  Location: [facility] INVASIVE CV LAB;  Service: Cardiovascular;  Laterality: N/A;  . HIATAL HERNIA REPAIR    . INTRAVASCULAR ULTRASOUND/IVUS N/A 10/09/2017   Procedure: Intravascular Ultrasound/IVUS;  Surgeon: [name], MD;  Location: [facility]  INVASIVE CV LAB;  Service: Cardiovascular;  Laterality: N/A;  . INTRAVASCULAR ULTRASOUND/IVUS N/A 07/21/2018   Procedure: Intravascular Ultrasound/IVUS;  Surgeon: Yvonne Kendall, MD;  Location: [facility] INVASIVE CV LAB;  Service: Cardiovascular;  Laterality: N/A;  . LEFT HEART CATH AND CORONARY ANGIOGRAPHY N/A 10/09/2017   Procedure: LEFT HEART CATH AND CORONARY ANGIOGRAPHY;  Surgeon: [name], MD;  Location: [facility] INVASIVE CV LAB;  Service: Cardiovascular;  Laterality: N/A;  . LEFT HEART CATH AND CORONARY ANGIOGRAPHY N/A 07/21/2018   Procedure: LEFT HEART CATH AND CORONARY ANGIOGRAPHY;  Surgeon: Yvonne Kendall, MD;  Location: [facility] INVASIVE CV LAB;  Service: Cardiovascular;   Laterality: N/A;  . LEFT HEART CATHETERIZATION WITH CORONARY ANGIOGRAM N/A 02/17/2014   Procedure: LEFT HEART CATHETERIZATION WITH CORONARY ANGIOGRAM;  Surgeon: Micheline Chapman, MD;  Location: [facility] CATH LAB;  Service: Cardiovascular;  Laterality: N/A;  . SHOULDER ARTHROSCOPY     Right  . 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 SOFT TISSUE TUMOR RESECTION     Benign neck tumor  . Vocal cord polyp removal       Current Outpatient Medications  Medication Sig Dispense Refill  . allopurinol (ZYLOPRIM) 100 MG tablet Take 100 mg by mouth every morning.     Marland Kitchen amLODipine (NORVASC) 10 MG tablet Take 1 tablet (10 mg total) by mouth daily. (Patient taking differently: Take 10 mg by mouth every morning. ) 90 tablet 3  . aspirin 81 MG tablet Take 81 mg by mouth every morning.     Marland Kitchen atorvastatin (LIPITOR) 80 MG tablet TAKE 1 TABLET BY MOUTH ONCE DAILY 30 tablet 5  . carvedilol (COREG) 25 MG tablet TAKE 1 TABLET BY MOUTH TWO TIMES DAILY WITH A MEAL 60 tablet 5  . insulin NPH-regular Human (NOVOLIN 70/30) (70-30) 100 UNIT/ML injection Inject 48 Units into the skin 2 (two) times daily.    . isosorbide mononitrate (IMDUR) 120 MG 24 hr tablet Take 1 tablet (120 mg total) by mouth daily. *NEEDS OFFICE VISIT FOR FURTHER REFILLS* 90 tablet 0  . losartan (COZAAR) 100 MG tablet Take 100 mg by mouth every morning.     . metFORMIN (GLUCOPHAGE) 500 MG tablet Take 500 mg by mouth 2 (two) times daily.     . nitroGLYCERIN (NITROSTAT) 0.4 MG SL tablet Place 1 tablet (0.4 mg total) under the tongue every 5 (five) minutes as needed for chest pain. 25 tablet 11  . omeprazole (PRILOSEC) 20 MG capsule Take 20 mg by mouth every morning.     . chlorthalidone (HYGROTON) 25 MG tablet Take 1 tablet (25 mg total) by mouth daily. 90 tablet 3  . fenofibrate (TRICOR) 145 MG tablet Take 1 tablet (145 mg total) by mouth daily. 90 tablet 3   No current facility-administered medications for this visit.     Allergies:   Bee venom, Penicillins, and Plavix [clopidogrel bisulfate]    ROS:  Please see the history of present illness.   Otherwise, review of systems are positive for none.   All other systems are reviewed and negative.    PHYSICAL EXAM: VS:  BP (!) 152/76   Pulse 78   Ht 5\' 10"  (1.778 m)   Wt 203 lb (92.1 kg)   BMI 29.13 kg/m  , BMI Body mass index is 29.13 kg/m. GENERAL:  Well appearing NECK:  No jugular venous distention, waveform within normal limits, carotid upstroke brisk and symmetric, no bruits, no thyromegaly LUNGS:  Clear to auscultation bilaterally  CHEST:  Unremarkable HEART:  PMI not displaced or sustained,S1 and S2 within normal limits, no S3, no S4, no clicks, no rubs, no murmurs ABD:  Flat, positive bowel sounds normal in frequency in pitch, no bruits, no rebound, no guarding, no midline pulsatile mass, no hepatomegaly, no splenomegaly EXT:  2 plus pulses upper and decreased DP/PT bilateral lower, no edema, no cyanosis no clubbing   EKG:  EKG is ordered today. The ekg ordered today demonstrates sinus rhythm, rate 78, axis within normal limits, intervals within normal limits, premature ventricular contractions, nonspecific T wave changes, mildly prolonged QTC   Recent Labs: No results found for requested labs within last 8760 hours.    Lipid Panel    Component Value Date/Time   CHOL 132 05/30/2010 0938   TRIG 790 (H) 05/30/2010 0938   HDL 24 (L) 05/30/2010 0938   CHOLHDL 5.5 Ratio 05/30/2010 0938   VLDL NOT CALC mg/dL 05/30/2010 0938   LDLCALC See Comment mg/dL 05/30/2010 0938      Wt Readings from Last 3 Encounters:  08/12/19 203 lb (92.1 kg)  04/22/19 201 lb (91.2 kg)  11/04/18 200 lb (90.7 kg)    Cardiac cath2/3/20:  Diagnostic  Dominance: Right    Intervention     Conclusions: 1. 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 2. Successful PCI to mid/distal LAD using Resolute Onyx 2.0 x 18 mm DES (post-dilated to 2.6 mm). 3. 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   Other studies Reviewed: Additional studies/ records that were reviewed today include: Labs, Care Everywhere records.  . Review of the above records demonstrates:  Please see elsewhere in the note.     ASSESSMENT AND PLAN:  CAD:  The patient has no new sypmtoms.  No further cardiovascular testing is indicated.  We will continue with aggressive risk reduction.  He can stop his Brilinta.  HTN:   The blood pressure is elevated.  He can stop the hydrochlorothiazide and start chlorthalidone 25 mg daily and he will keep a blood pressure diary.   DM:    His blood sugar is not at target.  Cost is a consideration but I would suggest for his endocrinologist considering SGLT2 inhibitor or GLP-1 receptor antagonist.  I will defer however to their management.  His blood sugar was 9.7 A1c 1.1x2 this is much of an improvement at 7.7 most recently.   PVD:   He has bilateral SFA occlusion and he is being managed medically.   We talked about good foot hygiene.  He has claudication with walking but no rest pain or ulcers.  He will continue with risk reduction.   DYSLIPIDEMIA:   LDL was triglycerides were elevated at 198.  He has been off his fenofibrate and I am going to restart this.  LDL was 35 with an HDL of 20.  BRUIT:  He had mild carotid plaque in 2019.     No further imaging.   COVID EDUCATION:   Answers questions about the vaccine.  He is interested in finding out and we did mention pronation.     Current medicines are reviewed at length with the patient today.  The patient does not have concerns regarding medicines.  The following changes have  been made:  no change  Labs/ tests  ordered today include:   Orders Placed This Encounter  Procedures  . EKG 12-Lead     Disposition:   FU with me in 12 months     Signed, Rollene Rotunda, MD  08/12/2019 9:56 AM    [facility]

## 2019-08-11 ENCOUNTER — Other Ambulatory Visit: Payer: Self-pay | Admitting: Cardiovascular Disease

## 2019-08-11 NOTE — Telephone Encounter (Signed)
Refill request

## 2019-08-11 NOTE — Telephone Encounter (Signed)
Patient needs an appointment for further refills, thanks

## 2019-08-11 NOTE — Telephone Encounter (Signed)
Patient is scheduled at the [facility] office 2/24

## 2019-08-12 ENCOUNTER — Other Ambulatory Visit: Payer: Self-pay

## 2019-08-12 ENCOUNTER — Encounter: Payer: Self-pay | Admitting: Cardiology

## 2019-08-12 ENCOUNTER — Ambulatory Visit (INDEPENDENT_AMBULATORY_CARE_PROVIDER_SITE_OTHER): Payer: Medicare Other | Admitting: Cardiology

## 2019-08-12 VITALS — BP 152/76 | HR 78 | Ht 70.0 in | Wt 203.0 lb

## 2019-08-12 DIAGNOSIS — I10 Essential (primary) hypertension: Secondary | ICD-10-CM | POA: Diagnosis not present

## 2019-08-12 DIAGNOSIS — Z7189 Other specified counseling: Secondary | ICD-10-CM

## 2019-08-12 DIAGNOSIS — I2510 Atherosclerotic heart disease of native coronary artery without angina pectoris: Secondary | ICD-10-CM

## 2019-08-12 DIAGNOSIS — E785 Hyperlipidemia, unspecified: Secondary | ICD-10-CM

## 2019-08-12 DIAGNOSIS — E118 Type 2 diabetes mellitus with unspecified complications: Secondary | ICD-10-CM | POA: Diagnosis not present

## 2019-08-12 MED ORDER — CHLORTHALIDONE 25 MG PO TABS: 25.0000 mg | ORAL_TABLET | Freq: Every day | ORAL | 3 refills | Status: DC

## 2019-08-12 MED ORDER — FENOFIBRATE 145 MG PO TABS: 145.0000 mg | ORAL_TABLET | Freq: Every day | ORAL | 3 refills | Status: DC

## 2019-08-12 NOTE — Patient Instructions (Signed)
Medication Instructions:  Please stop you Hydrochlorothiazide and Brilinta. Start Fenofibrate 145 mg once a day and Chlorthalidone 25 mg a day. Continue all other medications as listed.  *If you need a refill on your cardiac medications before your next appointment, please call your pharmacy*  Follow-Up: At [facility], you and your health needs are our priority.  As part of our continuing mission to provide you with exceptional heart care, we have created designated Provider Care Teams.  These Care Teams include your primary Cardiologist (physician) and Advanced Practice Providers (APPs -  Physician Assistants and Nurse Practitioners) who all work together to provide you with the care you need, when you need it.  Your next appointment:   12 month(s)  The format for your next appointment:   In Person  Provider:   Rollene Rotunda, MD  Thank you for choosing [facility]!!    Make sure you keep a check on your blood pressure (check at least once a week)   COVID Vaccine:  Call [phone]

## 2019-09-14 ENCOUNTER — Other Ambulatory Visit: Payer: Self-pay

## 2019-09-14 MED ORDER — ISOSORBIDE MONONITRATE ER 120 MG PO TB24: 120.0000 mg | ORAL_TABLET | Freq: Every day | ORAL | 3 refills | Status: DC

## 2020-02-17 IMAGING — CR DG CHEST 1V PORT
1 series · 1 of 1 positions shown · non-contrast
Comparison: 11/04/2017

CLINICAL DATA: Chest pressure and rattling in the chest while
breathing. Hypertensive.

EXAM:
PORTABLE CHEST 1 VIEW

[portable]
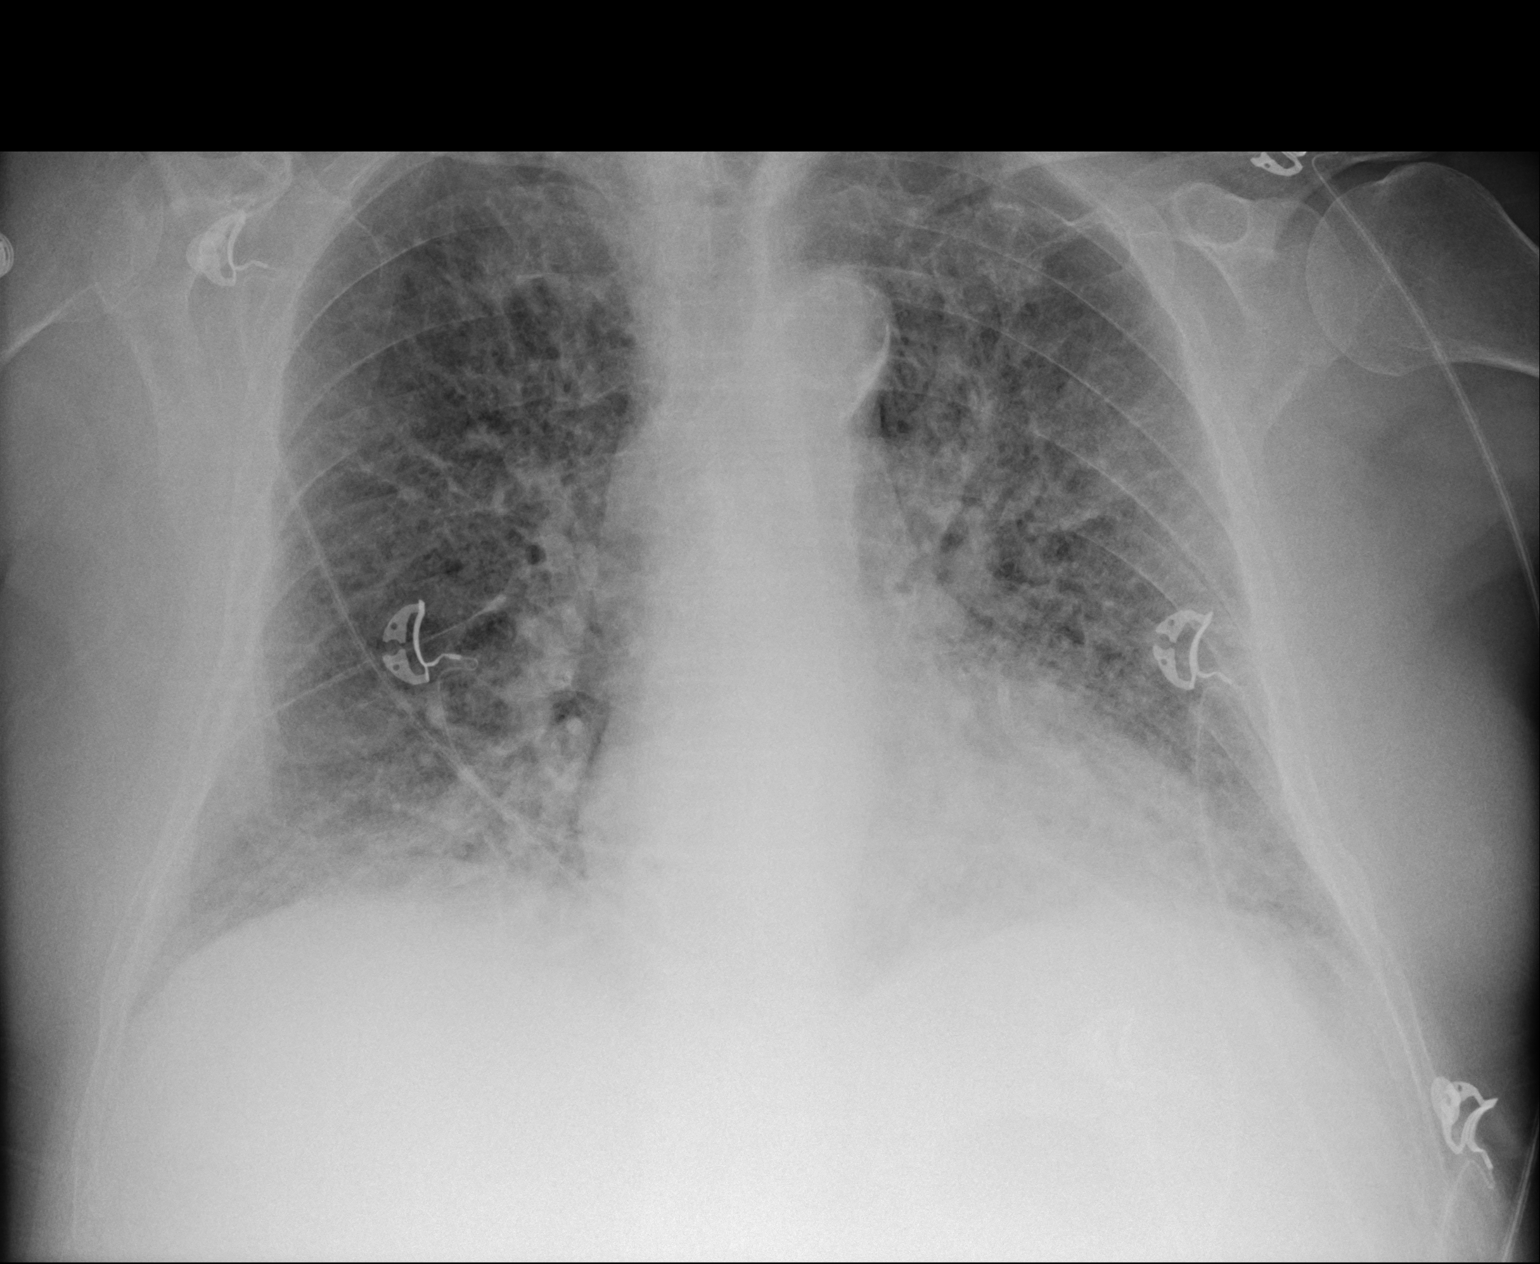

[1 of 1 positions shown; findings below may reference images not displayed]

FINDINGS: Cardiac enlargement with pulmonary vascular congestion and diffuse
alveolar and interstitial infiltration, likely edema. No blunting of
costophrenic angles. No pneumothorax. Mediastinal contours appear
intact. Calcification of the aorta.
IMPRESSION: Cardiac enlargement with pulmonary vascular congestion and bilateral
edema.

## 2020-07-19 ENCOUNTER — Emergency Department (HOSPITAL_COMMUNITY)
Admission: EM | Admit: 2020-07-19 | Discharge: 2020-07-19 | Disposition: A | Discharge status: Home / Self Care | Payer: Medicare Other | Attending: Emergency Medicine | Admitting: Emergency Medicine

## 2020-07-19 DIAGNOSIS — Z951 Presence of aortocoronary bypass graft: Secondary | ICD-10-CM | POA: Insufficient documentation

## 2020-07-19 DIAGNOSIS — Z79899 Other long term (current) drug therapy: Secondary | ICD-10-CM | POA: Diagnosis not present

## 2020-07-19 DIAGNOSIS — E119 Type 2 diabetes mellitus without complications: Secondary | ICD-10-CM | POA: Diagnosis not present

## 2020-07-19 DIAGNOSIS — Z794 Long term (current) use of insulin: Secondary | ICD-10-CM | POA: Diagnosis not present

## 2020-07-19 DIAGNOSIS — M79662 Pain in left lower leg: Secondary | ICD-10-CM

## 2020-07-19 DIAGNOSIS — Z87891 Personal history of nicotine dependence: Secondary | ICD-10-CM | POA: Insufficient documentation

## 2020-07-19 DIAGNOSIS — I25729 Atherosclerosis of autologous artery coronary artery bypass graft(s) with unspecified angina pectoris: Secondary | ICD-10-CM | POA: Diagnosis not present

## 2020-07-19 DIAGNOSIS — Z7984 Long term (current) use of oral hypoglycemic drugs: Secondary | ICD-10-CM | POA: Diagnosis not present

## 2020-07-19 DIAGNOSIS — Z7982 Long term (current) use of aspirin: Secondary | ICD-10-CM | POA: Insufficient documentation

## 2020-07-19 DIAGNOSIS — I10 Essential (primary) hypertension: Secondary | ICD-10-CM | POA: Insufficient documentation

## 2020-07-19 DIAGNOSIS — I70222 Atherosclerosis of native arteries of extremities with rest pain, left leg: Secondary | ICD-10-CM | POA: Diagnosis not present

## 2020-07-19 NOTE — Discharge Instructions (Addendum)
The office of Dr. Arbie Cookey will contact you in order to setup an appointment for arteriogram.   You may continue taking your medication as prescribed.

## 2020-07-19 NOTE — Consult Note (Signed)
[facility] [place]  [name] MRN: [id number] DOB: 03/13/1946 Sex: male    HPI: [name] [name] is a 75 y.o. male presenting to the emergency room as directed by his primary care physician due to ischemia in his left foot.  He has a long history of coronary disease with multiple prior coronary angioplasties.  He reports he has had total of 4 myocardial infarctions.  Does have a history of diabetes and hyperlipidemia.  He has a many year history of bilateral lower extremity claudication.  Over the past several weeks he has had progression to rest pain in his left foot.  He reports that he is awakened several times during the night and either has to walk or dangle his left foot over the edge of the bed with temporary relief.  He has had no tissue loss.  Past Medical History:  Diagnosis Date  . CAP (community acquired pneumonia) 10/11/2017  . Coronary artery disease    a. Cath: 2000 nonobs dz. b. 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 s/p BMS to LAD & RCA ; s/p PCI/DES dRCA & PDA x2 2011. d. cath 02/17/14: LM patent, LAD calcified, diffuse irregs, no high grade stenosis mLAD stent patent, LCx, diffuse irregs no high-grade dz, RCA total occ w/ L to R collats, medical Rx rec  . Diabetes mellitus   . Diastolic dysfunction    a. echo 02/18/14: EF 45-50%, mild LVH, AK of inferior and inferoseptal myocardium, GR1DD  . Hiatal hernia   . Hyperlipidemia   . LV dysfunction   . NSTEMI (non-ST elevated myocardial infarction) (HCC) 10/07/2017  . PVD (peripheral vascular disease) (HCC)    ABIs of 0.69 on right and 0.93 on left  . Renal artery stenosis (HCC)    Mild, bilateral  . S/P angioplasty with stent, atherectomy DES to pLAD and DES to [facility] 10/10/17  10/11/2017  . Sleep apnea    Mild    Family History  Problem Relation Age of Onset  . Heart attack Father 41  . Cancer Mother   . Cancer  Brother     SOCIAL HISTORY: Social History   Tobacco Use  . Smoking status: Former Smoker    Quit date: 06/19/2003    Years since quitting: 17.0  . Smokeless tobacco: Never Used  Substance Use Topics  . Alcohol use: No    Allergies  Allergen Reactions  . Bee Venom Swelling  . Penicillins Itching    Did it involve swelling of the face/tongue/throat, SOB, or low BP? Yes-swelling Did it involve sudden or severe rash/hives, skin peeling, or any reaction on the inside of your mouth or nose? No-itching Did you need to seek medical attention at a hospital or doctor's office? Yes When did it last happen? Over 10 years  If all above answers are "NO", may proceed with cephalosporin use.   Marland Kitchen Plavix [Clopidogrel Bisulfate] Other (See Comments)    Patient is noted to be a nonresponder to Plavix    No current facility-administered medications for this encounter.   Current Outpatient Medications  Medication Sig Dispense Refill  . allopurinol (ZYLOPRIM) 100 MG tablet Take 100 mg by mouth every morning.     Marland Kitchen amLODipine (NORVASC) 10 MG tablet Take 1 tablet (10 mg total) by mouth daily. (Patient taking differently: Take 10 mg by mouth every morning. ) 90 tablet 3  . aspirin 81 MG tablet Take 81 mg by  mouth every morning.     Marland Kitchen atorvastatin (LIPITOR) 80 MG tablet TAKE 1 TABLET BY MOUTH EVERY DAY 90 tablet 3  . carvedilol (COREG) 25 MG tablet TAKE 1 TABLET BY MOUTH TWO TIMES DAILY WITH A MEAL 180 tablet 3  . chlorthalidone (HYGROTON) 25 MG tablet Take 1 tablet (25 mg total) by mouth daily. 90 tablet 3  . fenofibrate (TRICOR) 145 MG tablet Take 1 tablet (145 mg total) by mouth daily. 90 tablet 3  . insulin NPH-regular Human (NOVOLIN 70/30) (70-30) 100 UNIT/ML injection Inject 48 Units into the skin 2 (two) times daily.    . isosorbide mononitrate (IMDUR) 120 MG 24 hr tablet Take 1 tablet (120 mg total) by mouth daily. 90 tablet 3  . losartan (COZAAR) 100 MG tablet Take 100 mg by mouth every  morning.     . metFORMIN (GLUCOPHAGE) 500 MG tablet Take 500 mg by mouth 2 (two) times daily.     . nitroGLYCERIN (NITROSTAT) 0.4 MG SL tablet Place 1 tablet (0.4 mg total) under the tongue every 5 (five) minutes as needed for chest pain. 25 tablet 11  . omeprazole (PRILOSEC) 20 MG capsule Take 20 mg by mouth every morning.       REVIEW OF SYSTEMS:  Reviewed and as history and physical with nothing to add  PHYSICAL EXAM: Vitals:   07/19/20 1423  BP: (!) 161/67  Pulse: (!) 102  Resp: 18  Temp: 99.1 F (37.3 C)  TempSrc: Oral  SpO2: 95%    GENERAL: The patient is a well-nourished male, in no acute distress. The vital signs are documented above. CARDIOVASCULAR: 2+ radial pulses bilaterally.  I do not palpate femoral pulses.  Patient is unfortunately in a hallway making examination somewhat difficulty due to the Covid surge.  He has no femoral popliteal or distal pulses. PULMONARY: There is good air exchange  MUSCULOSKELETAL: There are no major deformities or cyanosis. NEUROLOGIC: No focal weakness or paresthesias are detected. SKIN: There are no ulcers or rashes noted.  Does have dependent rubor in his left foot.  No ulcerations PSYCHIATRIC: The patient has a normal affect.  DATA:  Prior noninvasive studies from 2020 revealed noncompressible vessels with monophasic flow  MEDICAL ISSUES: Long history of lower extremity claudication now advanced to rest pain on his left foot.  He does have motor and sensory function intact in his left foot.  This is been present for several weeks.  He will be discharged from the emergency room this evening and will be admitted later this week for outpatient arteriography.  He understands that the may have a ability for endovascular treatment.  Also understands this may require surgical treatment.  [name] office will contact him tomorrow to schedule arteriography    Larina Earthly, MD [facility] Tel [phone]

## 2020-07-19 NOTE — ED Provider Notes (Signed)
[facility] EMERGENCY DEPARTMENT Provider Note   CSN: [id number] Arrival date & time: 07/19/20  1321     History Chief Complaint  Patient presents with  . Foot Pain    [name] [name] is a 75 y.o. male.  75 y.o male with a PMH of CAD, DM, NSTEMI presents to the ED sent in by PCP for pulseless foot at yesterdays office visit. Patient states he's had foot pain for the past two weeks, this is exacerbated with walking and relieve by rest. Reports the discomfort has been ongoing for some time but noted left foot to worsen in redness along with discomfort to the left calf. According to records, PVD was followed by cardiology for some time. Noted to have pulseless DP in 08/05/2018. He reports no tingling, numbness or other complaints.  The history is provided by the patient.  Foot Pain Pertinent negatives include no chest pain, no abdominal pain, no headaches and no shortness of breath.       Past Medical History:  Diagnosis Date  . CAP (community acquired pneumonia) 10/11/2017  . Coronary artery disease    a. Cath: 2000 nonobs dz. b. 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 s/p BMS to LAD & RCA ; s/p PCI/DES dRCA & PDA x2 2011. d. cath 02/17/14: LM patent, LAD calcified, diffuse irregs, no high grade stenosis mLAD stent patent, LCx, diffuse irregs no high-grade dz, RCA total occ w/ L to R collats, medical Rx rec  . Diabetes mellitus   . Diastolic dysfunction    a. echo 02/18/14: EF 45-50%, mild LVH, AK of inferior and inferoseptal myocardium, GR1DD  . Hiatal hernia   . Hyperlipidemia   . LV dysfunction   . NSTEMI (non-ST elevated myocardial infarction) (HCC) 10/07/2017  . PVD (peripheral vascular disease) (HCC)    ABIs of 0.69 on right and 0.93 on left  . Renal artery stenosis (HCC)    Mild, bilateral  . S/P angioplasty with stent, atherectomy DES to pLAD and DES to [facility] 10/10/17  10/11/2017  . Sleep apnea    Mild    Patient Active  Problem List   Diagnosis Date Noted  . Educated about COVID-19 virus infection 08/10/2019  . Elevated troponin level not due to acute coronary syndrome 07/21/2018  . S/P angioplasty with stent, atherectomy DES to pLAD and DES to [facility] 10/10/17  10/11/2017  . CAP (community acquired pneumonia) 10/11/2017  . Acute cardiogenic pulmonary edema (HCC) 10/09/2017  . Coronary artery disease involving native coronary artery of native heart with angina pectoris (HCC)   . Flash pulmonary edema (HCC)   . Acute hypoxemic respiratory failure (HCC) 10/07/2017  . Pulmonary edema 10/07/2017  . Essential hypertension 04/02/2017  . Dyslipidemia 04/02/2017  . Bruit 04/02/2017  . Intermediate coronary syndrome (HCC) 02/16/2014  . NSTEMI (non-ST elevated myocardial infarction) (HCC) 02/16/2014  . PVD 04/26/2010  . Type 2 diabetes mellitus without complication (HCC) 04/25/2010  . Mixed hyperlipidemia 04/25/2010  . Coronary atherosclerosis 04/25/2010  . RENAL ARTERY STENOSIS 04/25/2010  . SLEEP APNEA 04/25/2010    Past Surgical History:  Procedure Laterality Date  . CATARACT EXTRACTION    . CORONARY ATHERECTOMY N/A 10/10/2017   Procedure: CORONARY ATHERECTOMY - CSI;  Surgeon: Yvonne Kendall, MD;  Location: [facility] INVASIVE CV LAB;  Service: Cardiovascular;  Laterality: N/A;  . CORONARY STENT INTERVENTION N/A 10/10/2017   Procedure: CORONARY STENT INTERVENTION;  Surgeon: Yvonne Kendall, MD;  Location: [facility] INVASIVE CV  LAB;  Service: Cardiovascular;  Laterality: N/A;  . CORONARY STENT INTERVENTION N/A 07/21/2018   Procedure: CORONARY STENT INTERVENTION;  Surgeon: Yvonne Kendall, MD;  Location: [facility] INVASIVE CV LAB;  Service: Cardiovascular;  Laterality: N/A;  . HIATAL HERNIA REPAIR    . INTRAVASCULAR ULTRASOUND/IVUS N/A 10/09/2017   Procedure: Intravascular Ultrasound/IVUS;  Surgeon: [name], MD;  Location: [facility] INVASIVE CV LAB;  Service: Cardiovascular;  Laterality: N/A;  . INTRAVASCULAR ULTRASOUND/IVUS N/A  07/21/2018   Procedure: Intravascular Ultrasound/IVUS;  Surgeon: Yvonne Kendall, MD;  Location: [facility] INVASIVE CV LAB;  Service: Cardiovascular;  Laterality: N/A;  . LEFT HEART CATH AND CORONARY ANGIOGRAPHY N/A 10/09/2017   Procedure: LEFT HEART CATH AND CORONARY ANGIOGRAPHY;  Surgeon: [name], MD;  Location: [facility] INVASIVE CV LAB;  Service: Cardiovascular;  Laterality: N/A;  . LEFT HEART CATH AND CORONARY ANGIOGRAPHY N/A 07/21/2018   Procedure: LEFT HEART CATH AND CORONARY ANGIOGRAPHY;  Surgeon: Yvonne Kendall, MD;  Location: [facility] INVASIVE CV LAB;  Service: Cardiovascular;  Laterality: N/A;  . LEFT HEART CATHETERIZATION WITH CORONARY ANGIOGRAM N/A 02/17/2014   Procedure: LEFT HEART CATHETERIZATION WITH CORONARY ANGIOGRAM;  Surgeon: Micheline Chapman, MD;  Location: [facility] CATH LAB;  Service: Cardiovascular;  Laterality: N/A;  . SHOULDER ARTHROSCOPY     Right  . 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 SOFT TISSUE TUMOR RESECTION     Benign neck tumor  . Vocal cord polyp removal         Family History  Problem Relation Age of Onset  . Heart attack Father 57  . Cancer Mother   . Cancer Brother     Social History   Tobacco Use  . Smoking status: Former Smoker    Quit date: 06/19/2003    Years since quitting: 17.0  . Smokeless tobacco: Never Used  Vaping Use  . Vaping Use: Never used  Substance Use Topics  . Alcohol use: No  . Drug use: No    Home Medications Prior to Admission medications   Medication Sig Start Date End Date Taking? Authorizing Provider  allopurinol (ZYLOPRIM) 100 MG tablet Take 100 mg by mouth every morning.     [provider]  amLODipine (NORVASC) 10 MG tablet Take 1 tablet (10 mg total) by mouth daily. Patient taking differently: Take 10 mg by mouth every morning.  09/24/12   Rollene Rotunda, MD  aspirin 81 MG tablet Take 81 mg by mouth every morning.     [provider]  atorvastatin (LIPITOR) 80 MG tablet TAKE 1 TABLET  BY MOUTH EVERY DAY 08/12/19   Iran Ouch, MD  carvedilol (COREG) 25 MG tablet TAKE 1 TABLET BY MOUTH TWO TIMES DAILY WITH A MEAL 08/12/19   Iran Ouch, MD  chlorthalidone (HYGROTON) 25 MG tablet Take 1 tablet (25 mg total) by mouth daily. 08/12/19   Rollene Rotunda, MD  fenofibrate (TRICOR) 145 MG tablet Take 1 tablet (145 mg total) by mouth daily. 08/12/19   Rollene Rotunda, MD  insulin NPH-regular Human (NOVOLIN 70/30) (70-30) 100 UNIT/ML injection Inject 48 Units into the skin 2 (two) times daily.    [provider]  isosorbide mononitrate (IMDUR) 120 MG 24 hr tablet Take 1 tablet (120 mg total) by mouth daily. 09/14/19   Rollene Rotunda, MD  losartan (COZAAR) 100 MG tablet Take 100 mg by mouth every morning.     [provider]  metFORMIN (GLUCOPHAGE) 500 MG tablet Take 500 mg by  mouth 2 (two) times daily.  02/19/14   Dunn, Raymon Mutton, PA-C  nitroGLYCERIN (NITROSTAT) 0.4 MG SL tablet Place 1 tablet (0.4 mg total) under the tongue every 5 (five) minutes as needed for chest pain. 08/27/18   Rollene Rotunda, MD  omeprazole (PRILOSEC) 20 MG capsule Take 20 mg by mouth every morning.     [provider]    Allergies    Bee venom, Penicillins, and Plavix [clopidogrel bisulfate]  Review of Systems   Review of Systems  Constitutional: Negative for chills and fever.  HENT: Negative for sore throat.   Respiratory: Negative for shortness of breath.   Cardiovascular: Negative for chest pain.  Gastrointestinal: Negative for abdominal pain, nausea and vomiting.  Genitourinary: Negative for flank pain.  Musculoskeletal: Positive for myalgias.  Skin: Positive for color change. Negative for rash and wound.  Neurological: Negative for numbness and headaches.  All other systems reviewed and are negative.   Physical Exam Updated Vital Signs BP (!) 161/67   Pulse (!) 102   Temp 99.1 F (37.3 C) (Oral)   Resp 18   SpO2 95%   Physical Exam Vitals and nursing note  reviewed.  Constitutional:      Appearance: Normal appearance.  HENT:     Head: Normocephalic and atraumatic.     Nose: Nose normal.     Mouth/Throat:     Mouth: Mucous membranes are moist.  Eyes:     Pupils: Pupils are equal, round, and reactive to light.  Cardiovascular:     Pulses:          Dorsalis pedis pulses are 0 on the left side.       Posterior tibial pulses are 0 on the left side.  Pulmonary:     Effort: Pulmonary effort is normal.  Abdominal:     General: Abdomen is flat.  Musculoskeletal:     Cervical back: Normal range of motion and neck supple.     Left foot: No bunion.  Feet:     Left foot:     Skin integrity: Callus present.     Toenail Condition: Left toenails are abnormally thick.     Comments: Warmth, erythema, pain, capillary refill intact to the left lower leg. Skin:    General: Skin is warm.     Findings: Erythema present.       Neurological:     Mental Status: He is alert and oriented to person, place, and time.     ED Results / Procedures / Treatments   Labs (all labs ordered are listed, but only abnormal results are displayed) Labs Reviewed - No data to display  EKG None  Radiology No results found.  Procedures Procedures   Medications Ordered in ED Medications - No data to display  ED Course  I have reviewed the triage vital signs and the nursing notes.  Pertinent labs & imaging results that were available during my care of the patient were reviewed by me and considered in my medical decision making (see chart for details).    MDM Rules/Calculators/A&P   Patient with an extensive cardiac history presents to the ED sent in by PCP for pulseless limb.  Patient was evaluated by PCP yesterday and reports he was sent to the ED for further evaluation.  Reviewed patient records, he is follow pretty often by cardiology, Dr. Antoine Poche.  He reports this has been an ongoing problem but has been exacerbated in the past 2 weeks. He has  attempted to  see vascular but believe by PCP to expedite process via ED.   On today's visit patient is non ill appearing, did ambulate into the ED with a steady gate. Evaluation of bilateral lower extremities, left extremity remarkable for erythema, warmth, capillary refill is intact. Unable to palpate pulses, unable to obtain pulses with Doppler DP or PT. Callus present. According to review this has been an ongoing problem for patient, will place consult for vascular surgery for any recommendations.  5:12 PM Spoke to Dr. Arbie Cookey, Vascular surgeon who will evaluated patient in the ED.   Chart reviewed by me with a recent evaluation by Dr. Antoine Poche and a physical exam remarkable for:  EXT:  2 plus pulses upper and decreased DP/PT bilateral lower, no edema, no cyanosis no clubbing PVD:He has bilateral SFA occlusion and he is being managed medically.  We talked about good foot hygiene.  He has claudication with walking but no rest pain or ulcers.  He will continue with risk reduction.  Patient was evaluated by vascular surgery Dr. Arbie Cookey, he will undergo arteriogram later this week. His office will contact him in order to schedule an appointment. Patient was offered pain medication to control symptoms at home, he denied this at this time. We will continue his home medication regimen. Vitals remained stable, patient overall in good condition stable for discharge.   Portions of this note were generated with Scientist, clinical (histocompatibility and immunogenetics). Dictation errors may occur despite best attempts at proofreading.  Final Clinical Impression(s) / ED Diagnoses Final diagnoses:  Pain of left lower leg    Rx / DC Orders ED Discharge Orders    None       Claude Manges, PA-C 07/19/20 1831    Melene Plan, DO 07/20/20 [phone]

## 2020-07-19 NOTE — ED Triage Notes (Signed)
Pt arrives via POV from home with left foot pulseless per his doctor. VSS.

## 2020-07-20 ENCOUNTER — Other Ambulatory Visit: Payer: Self-pay

## 2020-07-20 ENCOUNTER — Other Ambulatory Visit (HOSPITAL_COMMUNITY)
Admission: RE | Admit: 2020-07-20 | Discharge: 2020-07-20 | Disposition: A | Discharge status: Home / Self Care | Payer: Medicare Other | Source: Ambulatory Visit | Attending: Vascular Surgery | Admitting: Vascular Surgery

## 2020-07-20 DIAGNOSIS — Z20822 Contact with and (suspected) exposure to covid-19: Secondary | ICD-10-CM | POA: Insufficient documentation

## 2020-07-20 DIAGNOSIS — Z01812 Encounter for preprocedural laboratory examination: Secondary | ICD-10-CM | POA: Diagnosis present

## 2020-07-20 LAB — SARS CORONAVIRUS 2 (TAT 6-24 HRS): SARS Coronavirus 2: NEGATIVE

## 2020-07-21 ENCOUNTER — Other Ambulatory Visit: Payer: Self-pay

## 2020-07-21 ENCOUNTER — Ambulatory Visit (HOSPITAL_BASED_OUTPATIENT_CLINIC_OR_DEPARTMENT_OTHER): Payer: Medicare Other

## 2020-07-21 ENCOUNTER — Encounter (HOSPITAL_COMMUNITY)
Admission: RE | Disposition: A | Discharge status: Home / Self Care | Payer: Self-pay | Source: Home / Self Care | Attending: Vascular Surgery

## 2020-07-21 ENCOUNTER — Ambulatory Visit (HOSPITAL_COMMUNITY)
Admission: RE | Admit: 2020-07-21 | Discharge: 2020-07-21 | Disposition: A | Discharge status: Home / Self Care | Payer: Medicare Other | Attending: Vascular Surgery | Admitting: Vascular Surgery

## 2020-07-21 DIAGNOSIS — I2510 Atherosclerotic heart disease of native coronary artery without angina pectoris: Secondary | ICD-10-CM | POA: Insufficient documentation

## 2020-07-21 DIAGNOSIS — Z880 Allergy status to penicillin: Secondary | ICD-10-CM | POA: Diagnosis not present

## 2020-07-21 DIAGNOSIS — E785 Hyperlipidemia, unspecified: Secondary | ICD-10-CM | POA: Diagnosis not present

## 2020-07-21 DIAGNOSIS — E1151 Type 2 diabetes mellitus with diabetic peripheral angiopathy without gangrene: Secondary | ICD-10-CM | POA: Insufficient documentation

## 2020-07-21 DIAGNOSIS — Z794 Long term (current) use of insulin: Secondary | ICD-10-CM | POA: Insufficient documentation

## 2020-07-21 DIAGNOSIS — I70222 Atherosclerosis of native arteries of extremities with rest pain, left leg: Secondary | ICD-10-CM | POA: Diagnosis not present

## 2020-07-21 DIAGNOSIS — Z7982 Long term (current) use of aspirin: Secondary | ICD-10-CM | POA: Insufficient documentation

## 2020-07-21 DIAGNOSIS — Z7984 Long term (current) use of oral hypoglycemic drugs: Secondary | ICD-10-CM | POA: Diagnosis not present

## 2020-07-21 DIAGNOSIS — Z01810 Encounter for preprocedural cardiovascular examination: Secondary | ICD-10-CM

## 2020-07-21 DIAGNOSIS — Z79899 Other long term (current) drug therapy: Secondary | ICD-10-CM | POA: Insufficient documentation

## 2020-07-21 DIAGNOSIS — Z87891 Personal history of nicotine dependence: Secondary | ICD-10-CM | POA: Diagnosis not present

## 2020-07-21 HISTORY — PX: ABDOMINAL AORTOGRAM W/LOWER EXTREMITY: CATH118223

## 2020-07-21 LAB — POCT I-STAT, CHEM 8
BUN: 25 mg/dL — ABNORMAL HIGH (ref 8–23)
Calcium, Ion: 1.24 mmol/L (ref 1.15–1.40)
Chloride: 106 mmol/L (ref 98–111)
Creatinine, Ser: 0.8 mg/dL (ref 0.61–1.24)
Glucose, Bld: 151 mg/dL — ABNORMAL HIGH (ref 70–99)
HCT: 44 % (ref 39.0–52.0)
Hemoglobin: 15 g/dL (ref 13.0–17.0)
Potassium: 3.7 mmol/L (ref 3.5–5.1)
Sodium: 143 mmol/L (ref 135–145)
TCO2: 23 mmol/L (ref 22–32)

## 2020-07-21 LAB — GLUCOSE, CAPILLARY: Glucose-Capillary: 102 mg/dL — ABNORMAL HIGH (ref 70–99)

## 2020-07-21 SURGERY — ABDOMINAL AORTOGRAM W/LOWER EXTREMITY
Anesthesia: LOCAL

## 2020-07-21 MED ORDER — ONDANSETRON HCL 4 MG/2ML IJ SOLN: 4.0000 mg | Freq: Four times a day (QID) | INTRAMUSCULAR | Status: DC | PRN

## 2020-07-21 MED ORDER — ACETAMINOPHEN 325 MG PO TABS: 650.0000 mg | ORAL_TABLET | ORAL | Status: DC | PRN

## 2020-07-21 MED ORDER — LIDOCAINE HCL (PF) 1 % IJ SOLN
INTRAMUSCULAR | Status: DC | PRN
  Administered 2020-07-21: 18 mL via INTRADERMAL

## 2020-07-21 MED ORDER — FENTANYL CITRATE (PF) 100 MCG/2ML IJ SOLN
INTRAMUSCULAR | Status: AC
  Filled 2020-07-21: qty 2

## 2020-07-21 MED ORDER — HYDRALAZINE HCL 20 MG/ML IJ SOLN
5.0000 mg | INTRAMUSCULAR | Status: DC | PRN
Start: 2020-07-21 — End: 2020-07-22

## 2020-07-21 MED ORDER — SODIUM CHLORIDE 0.9 % WEIGHT BASED INFUSION: 1.0000 mL/kg/h | INTRAVENOUS | Status: DC

## 2020-07-21 MED ORDER — FENTANYL CITRATE (PF) 100 MCG/2ML IJ SOLN
INTRAMUSCULAR | Status: DC | PRN
  Administered 2020-07-21: 50 ug via INTRAVENOUS

## 2020-07-21 MED ORDER — LABETALOL HCL 5 MG/ML IV SOLN: 10.0000 mg | INTRAVENOUS | Status: DC | PRN

## 2020-07-21 MED ORDER — SODIUM CHLORIDE 0.9 % IV SOLN: 250.0000 mL | INTRAVENOUS | Status: DC | PRN

## 2020-07-21 MED ORDER — HEPARIN (PORCINE) IN NACL 1000-0.9 UT/500ML-% IV SOLN
INTRAVENOUS | Status: DC | PRN
  Administered 2020-07-21 (×2): 500 mL

## 2020-07-21 MED ORDER — MIDAZOLAM HCL 2 MG/2ML IJ SOLN
INTRAMUSCULAR | Status: AC
  Filled 2020-07-21: qty 2

## 2020-07-21 MED ORDER — SODIUM CHLORIDE 0.9% FLUSH: 3.0000 mL | Freq: Two times a day (BID) | INTRAVENOUS | Status: DC

## 2020-07-21 MED ORDER — HYDROCODONE-ACETAMINOPHEN 5-325 MG PO TABS
1.0000 | ORAL_TABLET | Freq: Four times a day (QID) | ORAL | Status: DC | PRN
  Administered 2020-07-21: 1 via ORAL
  Filled 2020-07-21: qty 1

## 2020-07-21 MED ORDER — LIDOCAINE HCL (PF) 1 % IJ SOLN
INTRAMUSCULAR | Status: AC
  Filled 2020-07-21: qty 30

## 2020-07-21 MED ORDER — SODIUM CHLORIDE 0.9% FLUSH: 3.0000 mL | INTRAVENOUS | Status: DC | PRN

## 2020-07-21 MED ORDER — IODIXANOL 320 MG/ML IV SOLN
INTRAVENOUS | Status: DC | PRN
  Administered 2020-07-21: 130 mL via INTRA_ARTERIAL

## 2020-07-21 MED ORDER — MIDAZOLAM HCL 2 MG/2ML IJ SOLN
INTRAMUSCULAR | Status: DC | PRN
  Administered 2020-07-21: 1 mg via INTRAVENOUS

## 2020-07-21 MED ORDER — SODIUM CHLORIDE 0.9 % IV SOLN: INTRAVENOUS | Status: DC

## 2020-07-21 MED ORDER — HEPARIN (PORCINE) IN NACL 1000-0.9 UT/500ML-% IV SOLN
INTRAVENOUS | Status: AC
  Filled 2020-07-21: qty 1000

## 2020-07-21 SURGICAL SUPPLY — 8 items
CATH OMNI FLUSH 5F 65CM (CATHETERS) ×1 IMPLANT
KIT MICROPUNCTURE NIT STIFF (SHEATH) ×1 IMPLANT
KIT PV (KITS) ×2 IMPLANT
SHEATH PINNACLE 5F 10CM (SHEATH) ×1 IMPLANT
SYR MEDRAD MARK V 150ML (SYRINGE) ×1 IMPLANT
TRANSDUCER W/STOPCOCK (MISCELLANEOUS) ×2 IMPLANT
TRAY PV CATH (CUSTOM PROCEDURE TRAY) ×2 IMPLANT
WIRE STARTER BENTSON 035X150 (WIRE) ×1 IMPLANT

## 2020-07-21 NOTE — Progress Notes (Signed)
Lower extremity vein mapping has been completed.   Preliminary results in CV Proc.   Blanch Media 07/21/2020 3:57 PM

## 2020-07-21 NOTE — H&P (Signed)
History and Physical Interval Note:  07/21/2020 1:11 PM  [name] [name]  has presented today for surgery, with the diagnosis of clautication.  The various methods of treatment have been discussed with the patient and family. After consideration of risks, benefits and other options for treatment, the patient has consented to  Procedure(s): ABDOMINAL AORTOGRAM W/LOWER EXTREMITY (N/A) as a surgical intervention.  The patient's history has been reviewed, patient examined, no change in status, stable for surgery.  I have reviewed the patient's chart and labs.  Questions were answered to the patient's satisfaction.    Left leg angiogram - rest pain.  [name] [name]    Vascular and Vein Specialist of   [name]        MRN: [id number]        DOB: [birth year]          Sex: male    HPI: [name] [name] is a 75 y.o. male presenting to the emergency room as directed by his primary care physician due to ischemia in his left foot.  He has a long history of coronary disease with multiple prior coronary angioplasties.  He reports he has had total of 4 myocardial infarctions.  Does have a history of diabetes and hyperlipidemia.  He has a many year history of bilateral lower extremity claudication.  Over the past several weeks he has had progression to rest pain in his left foot.  He reports that he is awakened several times during the night and either has to walk or dangle his left foot over the edge of the bed with temporary relief.  He has had no tissue loss.      Past Medical History:  Diagnosis Date  . CAP (community acquired pneumonia) 10/11/2017  . Coronary artery disease    a. Cath: 2000 nonobs dz. b. 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 s/p BMS to LAD & RCA ; s/p PCI/DES dRCA & PDA x2 2011. d. cath 02/17/14: LM patent, LAD calcified, diffuse irregs, no high grade stenosis mLAD stent patent, LCx, diffuse irregs no high-grade dz,  RCA total occ w/ L to R collats, medical Rx rec  . Diabetes mellitus   . Diastolic dysfunction    a. echo 02/18/14: EF 45-50%, mild LVH, AK of inferior and inferoseptal myocardium, GR1DD  . Hiatal hernia   . Hyperlipidemia   . LV dysfunction   . NSTEMI (non-ST elevated myocardial infarction) (HCC) 10/07/2017  . PVD (peripheral vascular disease) (HCC)    ABIs of 0.69 on right and 0.93 on left  . Renal artery stenosis (HCC)    Mild, bilateral  . S/P angioplasty with stent, atherectomy DES to pLAD and DES to [facility] 10/10/17  10/11/2017  . Sleep apnea    Mild         Family History  Problem Relation Age of Onset  . Heart attack Father 68  . Cancer Mother   . Cancer Brother     SOCIAL HISTORY: Social History        Tobacco Use  . Smoking status: Former Smoker    Quit date: 06/19/2003    Years since quitting: 17.0  . Smokeless tobacco: Never Used  Substance Use Topics  . Alcohol use: No         Allergies  Allergen Reactions  . Bee Venom Swelling  . Penicillins Itching    Did it involve swelling of the face/tongue/throat, SOB, or low BP? Yes-swelling Did  it involve sudden or severe rash/hives, skin peeling, or any reaction on the inside of your mouth or nose? No-itching Did you need to seek medical attention at a hospital or doctor's office? Yes When did it last happen? Over 10 years  If all above answers are "NO", may proceed with cephalosporin use.   Marland Kitchen Plavix [Clopidogrel Bisulfate] Other (See Comments)    Patient is noted to be a nonresponder to Plavix    No current facility-administered medications for this encounter.         Current Outpatient Medications  Medication Sig Dispense Refill  . allopurinol (ZYLOPRIM) 100 MG tablet Take 100 mg by mouth every morning.     Marland Kitchen amLODipine (NORVASC) 10 MG tablet Take 1 tablet (10 mg total) by mouth daily. (Patient taking differently: Take 10 mg by mouth every morning. ) 90 tablet 3  .  aspirin 81 MG tablet Take 81 mg by mouth every morning.     Marland Kitchen atorvastatin (LIPITOR) 80 MG tablet TAKE 1 TABLET BY MOUTH EVERY DAY 90 tablet 3  . carvedilol (COREG) 25 MG tablet TAKE 1 TABLET BY MOUTH TWO TIMES DAILY WITH A MEAL 180 tablet 3  . chlorthalidone (HYGROTON) 25 MG tablet Take 1 tablet (25 mg total) by mouth daily. 90 tablet 3  . fenofibrate (TRICOR) 145 MG tablet Take 1 tablet (145 mg total) by mouth daily. 90 tablet 3  . insulin NPH-regular Human (NOVOLIN 70/30) (70-30) 100 UNIT/ML injection Inject 48 Units into the skin 2 (two) times daily.    . isosorbide mononitrate (IMDUR) 120 MG 24 hr tablet Take 1 tablet (120 mg total) by mouth daily. 90 tablet 3  . losartan (COZAAR) 100 MG tablet Take 100 mg by mouth every morning.     . metFORMIN (GLUCOPHAGE) 500 MG tablet Take 500 mg by mouth 2 (two) times daily.     . nitroGLYCERIN (NITROSTAT) 0.4 MG SL tablet Place 1 tablet (0.4 mg total) under the tongue every 5 (five) minutes as needed for chest pain. 25 tablet 11  . omeprazole (PRILOSEC) 20 MG capsule Take 20 mg by mouth every morning.       REVIEW OF SYSTEMS:  Reviewed and as history and physical with nothing to add  PHYSICAL EXAM:    Vitals:   07/19/20 1423  BP: (!) 161/67  Pulse: (!) 102  Resp: 18  Temp: 99.1 F (37.3 C)  TempSrc: Oral  SpO2: 95%    GENERAL: The patient is a well-nourished male, in no acute distress. The vital signs are documented above. CARDIOVASCULAR: 2+ radial pulses bilaterally.  I do not palpate femoral pulses.  Patient is unfortunately in a hallway making examination somewhat difficulty due to the Covid surge.  He has no femoral popliteal or distal pulses. PULMONARY: There is good air exchange  MUSCULOSKELETAL: There are no major deformities or cyanosis. NEUROLOGIC: No focal weakness or paresthesias are detected. SKIN: There are no ulcers or rashes noted.  Does have dependent rubor in his left foot.  No ulcerations PSYCHIATRIC:  The patient has a normal affect.  DATA:  Prior noninvasive studies from 2020 revealed noncompressible vessels with monophasic flow  MEDICAL ISSUES: Long history of lower extremity claudication now advanced to rest pain on his left foot.  He does have motor and sensory function intact in his left foot.  This is been present for several weeks.  He will be discharged from the emergency room this evening and will be admitted later this week for outpatient  arteriography.  He understands that the may have a ability for endovascular treatment.  Also understands this may require surgical treatment.  My office will contact him tomorrow to schedule arteriography    Larina Earthly, MD [facility] Tel [phone]

## 2020-07-21 NOTE — Progress Notes (Signed)
Vein mapping done

## 2020-07-21 NOTE — Discharge Instructions (Signed)
NO METFORMIN/ GLUCOPHAGE FOR 2 DAYS     Femoral Site Care  This sheet gives you information about how to care for yourself after your procedure. Your health care provider may also give you more specific instructions. If you have problems or questions, contact your health care provider. What can I expect after the procedure? After the procedure, it is common to have:  Bruising that usually fades within 1-2 weeks.  Tenderness at the site. Follow these instructions at home: Wound care  Follow instructions from your health care provider about how to take care of your insertion site. Make sure you: ? Wash your hands with soap and water before you change your bandage (dressing). If soap and water are not available, use hand sanitizer. ? Change your dressing as told by your health care provider. ? Leave stitches (sutures), skin glue, or adhesive strips in place. These skin closures may need to stay in place for 2 weeks or longer. If adhesive strip edges start to loosen and curl up, you may trim the loose edges. Do not remove adhesive strips completely unless your health care provider tells you to do that.  Do not take baths, swim, or use a hot tub until your health care provider approves.  You may shower 24-48 hours after the procedure or as told by your health care provider. ? Gently wash the site with plain soap and water. ? Pat the area dry with a clean towel. ? Do not rub the site. This may cause bleeding.  Do not apply powder or lotion to the site. Keep the site clean and dry.  Check your femoral site every day for signs of infection. Check for: ? Redness, swelling, or pain. ? Fluid or blood. ? Warmth. ? Pus or a bad smell. Activity  For the first 2-3 days after your procedure, or as long as directed: ? Avoid climbing stairs as much as possible. ? Do not squat.  Do not lift anything that is heavier than 10 lb (4.5 kg), or the limit that you are told, until your health care  provider says that it is safe.  Rest as directed. ? Avoid sitting for a long time without moving. Get up to take short walks every 1-2 hours.  Do not drive for 24 hours if you were given a medicine to help you relax (sedative). General instructions  Take over-the-counter and prescription medicines only as told by your health care provider.  Keep all follow-up visits as told by your health care provider. This is important. Contact a health care provider if you have:  A fever or chills.  You have redness, swelling, or pain around your insertion site. Get help right away if:  The catheter insertion area swells very fast.  You pass out.  You suddenly start to sweat or your skin gets clammy.  The catheter insertion area is bleeding, and the bleeding does not stop when you hold steady pressure on the area.  The area near or just beyond the catheter insertion site becomes pale, cool, tingly, or numb. These symptoms may represent a serious problem that is an emergency. Do not wait to see if the symptoms will go away. Get medical help right away. Call your local emergency services (911 in the [place] Do not drive yourself to the hospital. Summary  After the procedure, it is common to have bruising that usually fades within 1-2 weeks.  Check your femoral site every day for signs of infection.  Do not lift   anything that is heavier than 10 lb (4.5 kg), or the limit that you are told, until your health care provider says that it is safe. This information is not intended to replace advice given to you by your health care provider. Make sure you discuss any questions you have with your health care provider. Document Revised: 02/05/2020 Document Reviewed: 02/05/2020 Elsevier Patient Education  2021 [employer] Inc.  

## 2020-07-21 NOTE — Progress Notes (Signed)
Site area: rt groin arterial sheath Site Prior to Removal:  Level 0 Pressure Applied For: 20 minutes Manual:   yes Patient Status During Pull:  Gauze and tegaderm Post Pull Site:  Level 0 Post Pull Instructions Given:  yes Post Pull Pulses Present: rt pt dopplered Dressing Applied:  Gauze andtegaderm Bedrest begins @ 1515 Comments:

## 2020-07-21 NOTE — Op Note (Signed)
    [name] MRN: [id number] DOB: July 23, 1945 Sex: male  07/21/2020 Pre-operative Diagnosis: Critical limb ischemia left lower extremity with rest pain Post-operative diagnosis:  Same Surgeon:  [name], MD Procedure Performed: 1.  Ultrasound-guided access of right common femoral artery 2.  Aortogram including catheter selection of aorta 3.  Bilateral lower extremity arteriogram with runoff 4.  26 minutes of monitored moderate conscious sedation time  Indications: 75 year old male with history of coronary artery disease, diabetes and hyperlipidemia that was seen in the ED on Tuesday night by Dr. Arbie Cookey with history of claudication progressing to rest pain in the left foot.  He presents today after risk benefits were discussed with Dr. Arbie Cookey with plans for lower extremity arteriogram and possible intervention.  Findings:   Aortogram showed single right and two left renal arteries with some calcification in the left common iliac artery that appeared to be about 30 to 40% but not flow-limiting given he does have a palpable left femoral pulse.  The left external iliac is widely patent.  He does have some calcification in the common femoral and a high-grade profunda stenosis proximally at the ostium.  Flush left SFA occlusion  and he does reconstitute above knee popliteal artery that is calcified with a very nice below-knee popliteal artery target and three-vessel runoff with dominant runoff being the posterior tibial artery.  Right lower extremity also shows a flush SFA occlusion with occluded SFA and reconstitution of an above-knee popliteal artery patent below-knee popliteal artery and apparent three-vessel calcified tibial runoff.   Procedure:  The patient was identified in the holding area and taken to room 8.  The patient was then placed supine on the table and prepped and draped in the usual sterile fashion.  A time out was called.  Ultrasound was used to evaluate  the right common femoral artery.  It was patent .  A digital ultrasound image was acquired.  A micropuncture needle was used to access the right common femoral artery under ultrasound guidance.  An 018 wire was advanced without resistance and a micropuncture sheath was placed.  The 018 wire was removed and a benson wire was placed.  The micropuncture sheath was exchanged for a 5 french sheath.  An omniflush catheter was advanced over the wire to the level of L-1.  An abdominal angiogram was obtained.  I did get some oblique shots of iliac arteries to rule out significant inflow disease in the left leg.  Catheter was pulled down in the abdominal aorta and bilateral lower extremity runoff was obtained with pertinent findings noted above.  After evaluating images his best option will be bypass in the left leg likely common femoral to below-knee popliteal artery.  That point in time wires and catheters were removed.  He will be taken to holding to have the left groin sheath removed.  He remained stable throughout the case.   Plan: Patient will be scheduled for a left common femoral to below-knee popliteal bypass.  I have offered to do this on Monday but he wants it next Wednesday due to his wife doctors appointments.  Vein mapping ordered today. Surgery scheduled with me 07/27/20.  [name], MD [facility] [place] Office: [phone]

## 2020-07-22 ENCOUNTER — Encounter (HOSPITAL_COMMUNITY): Payer: Self-pay | Admitting: Vascular Surgery

## 2020-07-26 ENCOUNTER — Encounter (HOSPITAL_COMMUNITY): Payer: Self-pay | Admitting: Vascular Surgery

## 2020-07-26 ENCOUNTER — Other Ambulatory Visit (HOSPITAL_COMMUNITY)
Admit: 2020-07-26 | Discharge: 2020-07-26 | Disposition: A | Discharge status: Home / Self Care | Payer: Medicare Other | Source: Ambulatory Visit | Attending: Vascular Surgery | Admitting: Vascular Surgery

## 2020-07-26 DIAGNOSIS — Z20822 Contact with and (suspected) exposure to covid-19: Secondary | ICD-10-CM | POA: Insufficient documentation

## 2020-07-26 DIAGNOSIS — Z01812 Encounter for preprocedural laboratory examination: Secondary | ICD-10-CM | POA: Insufficient documentation

## 2020-07-26 LAB — SARS CORONAVIRUS 2 (TAT 6-24 HRS): SARS Coronavirus 2: NEGATIVE

## 2020-07-26 NOTE — Progress Notes (Signed)
Anesthesia Chart Review: [name] [name]   Case: [id number] Date/Time: 07/27/20 0815   Procedure: LEFT FEMORAL-POPLITEAL ARTERY BYPASS GRAFTING (Left )   Anesthesia type: General   Pre-op diagnosis: PVD   [facility] / [facility]   Surgeons: [name], MD      DISCUSSION: Patient is a 75 year old male scheduled for the above procedure. Above procedure recommended following 07/22/19 PV a-gram.   History includes former smoker (quit 06/19/03), CAD (DES mid RCA and mid LAD 08/07/04; DES distal RCA and DES proximal PDA 06/05/10; NSTEMI with total occlusion mid RCA with left-to-right collaterals 02/16/14; NSTEMI s/p orbital atherectomy and DES proximal LAD and DES mid LAD for ISR 10/10/17; NSTEMI s/p DES mid/distal LAD and DES ostial LAD 07/21/18), LV dysfunction (EF 45% 2019, diastolic dysfunction (grade 1 7262), HTN, PVD, DM2, HLD, OSA ("mild", does not use CPAP), renal artery stenosis ("mild"), hiatal hernia.  He was doing well from a cardiac standpoint at 08/12/19 visit with Dr. Antoine Poche. No new testing ordered with 12 month follow-up recommended. He denied chest pain, cough, SOB, fever per PAT RN interview. Last PCI 07/21/18.   07/20/20 COVID-19 test negative. 07/27/20 COVID-19 test in process. Anesthesia team to evaluate on the day of surgery.    VS:  BP Readings from Last 3 Encounters:  07/21/20 134/63  07/19/20 (!) 143/79  08/12/19 (!) 152/76   Pulse Readings from Last 3 Encounters:  07/21/20 95  07/19/20 84  08/12/19 78    PROVIDERS: Alferd Apa, MD is listed as PCP  - Rollene Rotunda, MD is cardiologist. Last visit 08/12/19 with 12 month follow-up recommended. He was doing well from a CV standpoint, some chronic DOE but none like he had in 2020. No CV testing felt indicated at that time.    LABS: For day of surgery. As of 07/21/20, ISTAT showed Cr 0.80, H/H 15.0/44.0, glucose 151.   EKG: 08/12/19 ([facility]):  Sinus rhythm with occasional premature ventricular  complexes ST & T wave abnormality, consider inferior ischemia Prolonged QT (QT 422 ms, QTc 481 ms)   CV: Aortogram with BLE runoff 07/21/20: FINDINGS: - Aortogram showed single right and two left renal arteries with some calcification in the left common iliac artery that appeared to be about 30 to 40% but not flow-limiting given he does have a palpable left femoral pulse. The leftexternal iliac is widely patent. He does have some calcification in the common femoral and a high-grade profunda stenosis proximally at the ostium. Flush leftSFA occlusion and he does reconstitute above kneepopliteal artery that is calcifiedwith a very nice below-kneepopliteal arterytarget and three-vessel runoff with dominant runoff being the posterior tibial artery. - Right lower extremity also shows a flush SFA occlusion with occluded SFA and reconstitution of an above-knee popliteal artery patent below-knee popliteal artery and apparent three-vessel calcified tibial runoff. PLAN: Patientwillbe scheduled for a left common femoral to below-knee popliteal bypass.   Cardiac cath 07/21/18: Conclusions: 1. 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 2. Successful PCI to mid/distal LAD using Resolute Onyx 2.0 x 18 mm DES (post-dilated to 2.6 mm). 3. 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  Recommendations: 1. Remove right femoral artery sheath with manual compression in 2  hours. 2. Dual antiplatelet therapy with aspirin and ticagrelor for at least 12 months, ideally lifelong. 3. Aggressive secondary prevention.  If restenosis of LAD stents occurs, cardiac surgery consultation for CABG would need to be considered.   Carotid US  02/03/18: Final Interpretation:  - Right Carotid: Velocities in the right ICA are consistent with a 1-39% stenosis. Non-hemodynamically significant plaque <50% noted in the CCA. The RICA velocities remain within normal range and have decreased compared to the prior exam.  - Left Carotid: Velocities in the left ICA are consistent with a 1-39% stenosis.The LICA velocities remain within normal range and have decreased compared to the prior exam.  - Vertebrals: Bilateral vertebral arteries demonstrate antegrade flow.  - Subclavians: Left subclavian artery was stenotic but with no pressure difference in both arms and no evidence of vertebral steal. Left subclavian artery flow was disturbed. Normal flow hemodynamics were seen in the right subclavian artery.    Echo 10/07/17: Study Conclusions  - Left ventricle: The cavity size was normal. Wall thickness was  increased in a pattern of mild LVH. The estimated ejection  fraction was 45%. There is hypokinesis of the basal-midinferior  and inferoseptal myocardium. Doppler parameters are consistent  with abnormal left ventricular relaxation (grade 1 diastolic  dysfunction).  - Aortic valve: Poorly visualized. Moderately calcified annulus.  Mildly calcified leaflets.  - Mitral valve: Mildly calcified annulus. There was trivial  regurgitation.  - Right atrium: Central venous pressure (est): 3 mm Hg.  - Atrial septum: No defect or patent foramen ovale was identified.  - Tricuspid valve: There was trivial regurgitation.  - Pulmonary arteries: Systolic pressure could not be accurately  estimated.  - Pericardium, extracardiac: There was no pericardial effusion.  (Comparison: 02/18/14 LVEF 45-50%, inferior and inferoseptal akinesis, grade 1 DD)   Past Medical History:  Diagnosis Date  . CAP (community acquired pneumonia) 10/11/2017  . Coronary artery disease    a. Cath: 2000 nonobs dz. b. cath 2006 LAD 80%, LCx in anomalous vessel 70%,  mRCA 90%, & PDA 80%.c. s/p BMS to LAD & RCA ; s/p PCI/DES dRCA & PDA x2 2011. d. cath 02/17/14: LM patent, LAD calcified, diffuse irregs, no high grade stenosis mLAD stent patent, LCx, diffuse irregs no high-grade dz, RCA total occ w/ L to R collats, medical Rx rec  . Diabetes mellitus   . Diastolic dysfunction    a. echo 02/18/14: EF 45-50%, mild LVH, AK of inferior and inferoseptal myocardium, GR1DD  . Hiatal hernia   . Hyperlipidemia   . Hypertension   . LV dysfunction   . NSTEMI (non-ST elevated myocardial infarction) (HCC) 10/07/2017  . PVD (peripheral vascular disease) (HCC)    ABIs of 0.69 on right and 0.93 on left  . Renal artery stenosis (HCC)    Mild, bilateral  . S/P angioplasty with stent, atherectomy DES to pLAD and DES to [facility] 10/10/17  10/11/2017  . Sleep apnea    Mild    Past Surgical History:  Procedure Laterality Date  . ABDOMINAL AORTOGRAM W/LOWER EXTREMITY N/A 07/21/2020   Procedure: ABDOMINAL AORTOGRAM W/LOWER EXTREMITY;  Surgeon: [name], MD;  Location: [facility] INVASIVE CV LAB;  Service: Cardiovascular;  Laterality: N/A;  . CATARACT EXTRACTION    . CORONARY ATHERECTOMY N/A 10/10/2017   Procedure: CORONARY ATHERECTOMY - CSI;  Surgeon: Yvonne Kendall, MD;  [facility];  Service: Cardiovascular;  Laterality: N/A;  . CORONARY STENT INTERVENTION N/A 10/10/2017   Procedure: CORONARY STENT INTERVENTION;  Surgeon:  End, Cristal Deer, MD;  [facility] INVAS[facility] CV LAB;  Service: Cardiovascular;  Laterality: N/A;  . CORONARY STENT INTERVENTION N/A 07/21/2018   Procedure: CORONARY STENT INTERVENTION;  Surgeon: Yvonne Kendall, MD;  [facility] INVAS[facility] CV LAB;  Service: Cardiovascular;  Laterality: N/A;  . HIATAL HERNIA REPAIR    . INTRAVASCULAR ULTRASOUND/IVUS N/A 10/09/2017   Procedure: Intravascular Ultrasound/IVUS;  Surgeon: [name], MD;  Location[facility] INVAS[facility] CV LAB;  Service: Cardiovascular;  Laterality: N/A;  . INTRAVASCULAR ULTRASOUND/IVUS N/A  07/21/2018   Procedure: Intravascular Ultrasound/IVUS;  Surgeon: Yvonne Kendall, MD;  [facility] CV LAB;  Service: Cardiovascular;  Laterality: N/A;  . LEFT HEART CATH AND CORONARY ANGIOGRAPHY N/A 10/09/2017   Procedure: LEFT HEART CATH AND CORONARY ANGIOGRAPHY;  Surgeon: [name], MD;  Location[facility] INVAS[facility] CV LAB;  Service: Cardiovascular;  Laterality: N/A;  . LEFT HEART CATH AND CORONARY ANGIOGRAPHY N/A 07/21/2018   Procedure: LEFT HEART CATH AND CORONARY ANGIOGRAPHY;  Surgeon: Yvonne Kendall, MD;  [facility] CV LAB;  Service: Cardiovascular;  Laterality: N/A;  . LEFT HEART CATHETERIZATION WITH CORONARY ANGIOGRAM N/A 02/17/2014   Procedure: LEFT HEART CATHETERIZATION WITH CORONARY ANGIOGRAM;  Surgeon: Micheline Chapman, MD;  Location: [facility] CATH LAB;  Service: Cardiovascular;  Laterality: N/A;  . SHOULDER ARTHROSCOPY     Right  . 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 SOFT TISSUE TUMOR RESECTION     Benign neck tumor  . Vocal cord polyp removal      MEDICATIONS: No current facility-administered medications for this encounter.   Marland Kitchen allopurinol (ZYLOPRIM) 100 MG tablet  . amLODipine (NORVASC) 10 MG tablet  . aspirin 81 MG tablet  . atorvastatin (LIPITOR) 80 MG tablet  . carvedilol (COREG) 25 MG tablet  . chlorthalidone (HYGROTON) 25 MG tablet  . empagliflozin (JARDIANCE) 25 MG TABS tablet  . fenofibrate (TRICOR) 145 MG tablet  . gabapentin (NEURONTIN) 100 MG capsule  . isosorbide mononitrate (IMDUR) 120 MG 24 hr tablet  . losartan (COZAAR) 100 MG tablet  . metFORMIN (GLUCOPHAGE-XR) 500 MG 24 hr tablet  . nitroGLYCERIN (NITROSTAT) 0.4 MG SL tablet  . omeprazole (PRILOSEC) 20 MG capsule  . SOLIQUA 100-33 UNT-MCG/ML SOPN    Shonna Chock, PA-C Surgical Short Stay/Anesthesiology [facility] Phone [phone] [facility] Phone [phone] 07/26/2020 3:15 PM

## 2020-07-26 NOTE — Progress Notes (Signed)
PCP: Cardiologist:  Dr. Peru Lions  EKG:  08/12/19 CXR:  07/20/18 ECHO:  10/07/17 Stress Test:   Cardiac Cath:  07/21/18  Fasting Blood Sugar-   100-200 Checks Blood Sugar__2_ times a day  OSA:  Yes CPAP:  No   ASA: Continue  Covid test 07/26/20  Anesthesia Review:  Yes, MI x4 with stents.    Patient denies shortness of breath, fever, cough, and chest pain at PAT appointment.  Patient verbalized understanding of instructions provided today at the PAT appointment.  Patient asked to review instructions at home and day of surgery.

## 2020-07-26 NOTE — Anesthesia Preprocedure Evaluation (Addendum)
Anesthesia Evaluation  Patient identified by MRN, date of birth, ID band Patient awake    Reviewed: Allergy & Precautions, NPO status , Patient's Chart, lab work & pertinent test results  Airway Mallampati: II  TM Distance: >3 FB Neck ROM: Full    Dental  (+) Edentulous Upper, Partial Lower, Dental Advisory Given   Pulmonary former smoker,    breath sounds clear to auscultation       Cardiovascular hypertension,  Rhythm:Regular Rate:Normal     Neuro/Psych    GI/Hepatic   Endo/Other  diabetes  Renal/GU      Musculoskeletal   Abdominal   Peds  Hematology   Anesthesia Other Findings   Reproductive/Obstetrics                            Anesthesia Physical Anesthesia Plan  ASA: III  Anesthesia Plan: General   Post-op Pain Management:    Induction: Intravenous  PONV Risk Score and Plan: Ondansetron  Airway Management Planned: Oral ETT  Additional Equipment: Arterial line  Intra-op Plan:   Post-operative Plan:   Informed Consent: I have reviewed the patients History and Physical, chart, labs and discussed the procedure including the risks, benefits and alternatives for the proposed anesthesia with the patient or authorized representative who has indicated his/her understanding and acceptance.     Dental advisory given  Plan Discussed with: CRNA and Anesthesiologist  Anesthesia Plan Comments: (PAT note written 07/26/2020 by Shonna Chock, PA-C. )       Anesthesia Quick Evaluation

## 2020-07-27 ENCOUNTER — Inpatient Hospital Stay (HOSPITAL_COMMUNITY): Payer: Medicare Other | Admitting: Physician Assistant

## 2020-07-27 ENCOUNTER — Other Ambulatory Visit: Payer: Self-pay

## 2020-07-27 ENCOUNTER — Inpatient Hospital Stay (HOSPITAL_COMMUNITY)
Admission: RE | Admit: 2020-07-27 | Discharge: 2020-07-30 | DRG: 254 | Disposition: A | Discharge status: Home / Self Care | Payer: Medicare Other | Attending: Vascular Surgery | Admitting: Vascular Surgery

## 2020-07-27 ENCOUNTER — Encounter (HOSPITAL_COMMUNITY)
Admission: RE | Disposition: A | Discharge status: Home / Self Care | Payer: Self-pay | Source: Home / Self Care | Attending: Vascular Surgery

## 2020-07-27 ENCOUNTER — Encounter (HOSPITAL_COMMUNITY): Payer: Self-pay | Admitting: Vascular Surgery

## 2020-07-27 DIAGNOSIS — Z7982 Long term (current) use of aspirin: Secondary | ICD-10-CM | POA: Diagnosis not present

## 2020-07-27 DIAGNOSIS — Z888 Allergy status to other drugs, medicaments and biological substances status: Secondary | ICD-10-CM | POA: Diagnosis not present

## 2020-07-27 DIAGNOSIS — E1151 Type 2 diabetes mellitus with diabetic peripheral angiopathy without gangrene: Secondary | ICD-10-CM | POA: Diagnosis present

## 2020-07-27 DIAGNOSIS — Z91030 Bee allergy status: Secondary | ICD-10-CM | POA: Diagnosis not present

## 2020-07-27 DIAGNOSIS — E785 Hyperlipidemia, unspecified: Secondary | ICD-10-CM | POA: Diagnosis present

## 2020-07-27 DIAGNOSIS — I10 Essential (primary) hypertension: Secondary | ICD-10-CM | POA: Diagnosis present

## 2020-07-27 DIAGNOSIS — Z880 Allergy status to penicillin: Secondary | ICD-10-CM

## 2020-07-27 DIAGNOSIS — Z87891 Personal history of nicotine dependence: Secondary | ICD-10-CM

## 2020-07-27 DIAGNOSIS — Z79899 Other long term (current) drug therapy: Secondary | ICD-10-CM | POA: Diagnosis not present

## 2020-07-27 DIAGNOSIS — I739 Peripheral vascular disease, unspecified: Secondary | ICD-10-CM | POA: Diagnosis present

## 2020-07-27 DIAGNOSIS — Z955 Presence of coronary angioplasty implant and graft: Secondary | ICD-10-CM | POA: Diagnosis not present

## 2020-07-27 DIAGNOSIS — Z794 Long term (current) use of insulin: Secondary | ICD-10-CM

## 2020-07-27 DIAGNOSIS — G4730 Sleep apnea, unspecified: Secondary | ICD-10-CM | POA: Diagnosis present

## 2020-07-27 DIAGNOSIS — I252 Old myocardial infarction: Secondary | ICD-10-CM | POA: Diagnosis not present

## 2020-07-27 DIAGNOSIS — Z20822 Contact with and (suspected) exposure to covid-19: Secondary | ICD-10-CM | POA: Diagnosis present

## 2020-07-27 DIAGNOSIS — Z8249 Family history of ischemic heart disease and other diseases of the circulatory system: Secondary | ICD-10-CM | POA: Diagnosis not present

## 2020-07-27 DIAGNOSIS — I70222 Atherosclerosis of native arteries of extremities with rest pain, left leg: Secondary | ICD-10-CM | POA: Diagnosis present

## 2020-07-27 HISTORY — PX: VEIN HARVEST: SHX6363

## 2020-07-27 HISTORY — PX: FEMORAL-POPLITEAL BYPASS GRAFT: SHX937

## 2020-07-27 HISTORY — DX: Essential (primary) hypertension: I10

## 2020-07-27 LAB — COMPREHENSIVE METABOLIC PANEL
ALT: 14 U/L (ref 0–44)
AST: 15 U/L (ref 15–41)
Albumin: 3.4 g/dL — ABNORMAL LOW (ref 3.5–5.0)
Alkaline Phosphatase: 48 U/L (ref 38–126)
Anion gap: 14 (ref 5–15)
BUN: 38 mg/dL — ABNORMAL HIGH (ref 8–23)
CO2: 19 mmol/L — ABNORMAL LOW (ref 22–32)
Calcium: 8.8 mg/dL — ABNORMAL LOW (ref 8.9–10.3)
Chloride: 105 mmol/L (ref 98–111)
Creatinine, Ser: 1.04 mg/dL (ref 0.61–1.24)
GFR, Estimated: >60 mL/min (ref >60)
Glucose, Bld: 278 mg/dL — ABNORMAL HIGH (ref 70–99)
Potassium: 3.7 mmol/L (ref 3.5–5.1)
Sodium: 138 mmol/L (ref 135–145)
Total Bilirubin: 0.5 mg/dL (ref 0.3–1.2)
Total Protein: 6.4 g/dL — ABNORMAL LOW (ref 6.5–8.1)

## 2020-07-27 LAB — POCT I-STAT, CHEM 8
BUN: 34 mg/dL — ABNORMAL HIGH (ref 8–23)
BUN: 34 mg/dL — ABNORMAL HIGH (ref 8–23)
Calcium, Ion: 1.24 mmol/L (ref 1.15–1.40)
Calcium, Ion: 1.2 mmol/L (ref 1.15–1.40)
Chloride: 105 mmol/L (ref 98–111)
Chloride: 106 mmol/L (ref 98–111)
Creatinine, Ser: 0.8 mg/dL (ref 0.61–1.24)
Creatinine, Ser: 0.9 mg/dL (ref 0.61–1.24)
Glucose, Bld: 257 mg/dL — ABNORMAL HIGH (ref 70–99)
Glucose, Bld: 207 mg/dL — ABNORMAL HIGH (ref 70–99)
HCT: 36 % — ABNORMAL LOW (ref 39.0–52.0)
HCT: 33 % — ABNORMAL LOW (ref 39.0–52.0)
Hemoglobin: 12.2 g/dL — ABNORMAL LOW (ref 13.0–17.0)
Hemoglobin: 11.2 g/dL — ABNORMAL LOW (ref 13.0–17.0)
Potassium: 3.8 mmol/L (ref 3.5–5.1)
Potassium: 3.3 mmol/L — ABNORMAL LOW (ref 3.5–5.1)
Sodium: 139 mmol/L (ref 135–145)
Sodium: 141 mmol/L (ref 135–145)
TCO2: 22 mmol/L (ref 22–32)
TCO2: 22 mmol/L (ref 22–32)

## 2020-07-27 LAB — TYPE AND SCREEN
ABO/RH(D): A POS
Antibody Screen: NEGATIVE

## 2020-07-27 LAB — POCT ACTIVATED CLOTTING TIME
Activated Clotting Time: 214 seconds
Activated Clotting Time: 237 seconds

## 2020-07-27 LAB — CBC
HCT: 42.2 % (ref 39.0–52.0)
Hemoglobin: 13.3 g/dL (ref 13.0–17.0)
MCH: 27.5 pg (ref 26.0–34.0)
MCHC: 31.5 g/dL (ref 30.0–36.0)
MCV: 87.2 fL (ref 80.0–100.0)
Platelets: 199 10*3/uL (ref 150–400)
RBC: 4.84 MIL/uL (ref 4.22–5.81)
RDW: 14 % (ref 11.5–15.5)
WBC: 11.5 10*3/uL — ABNORMAL HIGH (ref 4.0–10.5)
nRBC: 0 % (ref 0.0–0.2)

## 2020-07-27 LAB — ABO/RH: ABO/RH(D): A POS

## 2020-07-27 LAB — URINALYSIS, ROUTINE W REFLEX MICROSCOPIC
Bacteria, UA: NONE SEEN
Bilirubin Urine: NEGATIVE
Glucose, UA: >=500 mg/dL — AB
Ketones, ur: NEGATIVE mg/dL
Leukocytes,Ua: NEGATIVE
Nitrite: NEGATIVE
Protein, ur: NEGATIVE mg/dL
Specific Gravity, Urine: 1.021 (ref 1.005–1.030)
pH: 5 (ref 5.0–8.0)

## 2020-07-27 LAB — HEMOGLOBIN A1C
Hgb A1c MFr Bld: 8.3 % — ABNORMAL HIGH (ref 4.8–5.6)
Mean Plasma Glucose: 191.51 mg/dL

## 2020-07-27 LAB — GLUCOSE, CAPILLARY
Glucose-Capillary: 115 mg/dL — ABNORMAL HIGH (ref 70–99)
Glucose-Capillary: 124 mg/dL — ABNORMAL HIGH (ref 70–99)
Glucose-Capillary: 176 mg/dL — ABNORMAL HIGH (ref 70–99)
Glucose-Capillary: 250 mg/dL — ABNORMAL HIGH (ref 70–99)

## 2020-07-27 LAB — APTT: aPTT: 28 seconds (ref 24–36)

## 2020-07-27 LAB — PROTIME-INR
INR: 1 (ref 0.8–1.2)
Prothrombin Time: 13.1 seconds (ref 11.4–15.2)

## 2020-07-27 SURGERY — BYPASS GRAFT FEMORAL-POPLITEAL ARTERY
Anesthesia: General | Site: Leg Upper | Laterality: Left

## 2020-07-27 MED ORDER — HEPARIN SODIUM (PORCINE) 1000 UNIT/ML IJ SOLN
INTRAMUSCULAR | Status: DC | PRN
  Administered 2020-07-27: 3000 [IU] via INTRAVENOUS
  Administered 2020-07-27: 9000 [IU] via INTRAVENOUS

## 2020-07-27 MED ORDER — ALLOPURINOL 100 MG PO TABS
100.0000 mg | ORAL_TABLET | Freq: Every morning | ORAL | Status: DC
  Administered 2020-07-28 – 2020-07-30 (×3): 100 mg via ORAL
  Filled 2020-07-27 (×3): qty 1

## 2020-07-27 MED ORDER — ACETAMINOPHEN 650 MG RE SUPP: 325.0000 mg | RECTAL | Status: DC | PRN

## 2020-07-27 MED ORDER — VANCOMYCIN HCL IN DEXTROSE 1-5 GM/200ML-% IV SOLN
1000.0000 mg | INTRAVENOUS | Status: AC
  Administered 2020-07-27: 1000 mg via INTRAVENOUS
  Filled 2020-07-27: qty 200

## 2020-07-27 MED ORDER — HEPARIN SODIUM (PORCINE) 1000 UNIT/ML IJ SOLN
INTRAMUSCULAR | Status: AC
  Filled 2020-07-27: qty 1

## 2020-07-27 MED ORDER — ISOSORBIDE MONONITRATE ER 60 MG PO TB24
120.0000 mg | ORAL_TABLET | Freq: Every day | ORAL | Status: DC
  Administered 2020-07-27 – 2020-07-29 (×3): 120 mg via ORAL
  Filled 2020-07-27 (×3): qty 2

## 2020-07-27 MED ORDER — ROCURONIUM 10MG/ML (10ML) SYRINGE FOR MEDFUSION PUMP - OPTIME
INTRAVENOUS | Status: DC | PRN
  Administered 2020-07-27: 100 mg via INTRAVENOUS

## 2020-07-27 MED ORDER — ALUM & MAG HYDROXIDE-SIMETH 200-200-20 MG/5ML PO SUSP
15.0000 mL | ORAL | Status: DC | PRN
Start: 2020-07-27 — End: 2020-07-30

## 2020-07-27 MED ORDER — GUAIFENESIN-DM 100-10 MG/5ML PO SYRP: 15.0000 mL | ORAL_SOLUTION | ORAL | Status: DC | PRN

## 2020-07-27 MED ORDER — MIDAZOLAM HCL 2 MG/2ML IJ SOLN
INTRAMUSCULAR | Status: DC | PRN
  Administered 2020-07-27: 2 mg via INTRAVENOUS

## 2020-07-27 MED ORDER — ASPIRIN EC 81 MG PO TBEC
81.0000 mg | DELAYED_RELEASE_TABLET | Freq: Every morning | ORAL | Status: DC
  Administered 2020-07-28 – 2020-07-30 (×3): 81 mg via ORAL
  Filled 2020-07-27 (×3): qty 1

## 2020-07-27 MED ORDER — SUFENTANIL CITRATE 50 MCG/ML IV SOLN
INTRAVENOUS | Status: AC
  Filled 2020-07-27: qty 1

## 2020-07-27 MED ORDER — SODIUM CHLORIDE 0.9 % IV SOLN: INTRAVENOUS | Status: DC

## 2020-07-27 MED ORDER — CHLORHEXIDINE GLUCONATE 0.12 % MT SOLN
15.0000 mL | Freq: Once | OROMUCOSAL | Status: AC
  Administered 2020-07-27: 15 mL via OROMUCOSAL

## 2020-07-27 MED ORDER — HYDRALAZINE HCL 20 MG/ML IJ SOLN: 5.0000 mg | INTRAMUSCULAR | Status: DC | PRN

## 2020-07-27 MED ORDER — AMLODIPINE BESYLATE 10 MG PO TABS
10.0000 mg | ORAL_TABLET | Freq: Every day | ORAL | Status: DC
Start: 2020-07-28 — End: 2020-07-30
  Administered 2020-07-28 – 2020-07-30 (×3): 10 mg via ORAL
  Filled 2020-07-27 (×3): qty 1

## 2020-07-27 MED ORDER — FENOFIBRATE 54 MG PO TABS
54.0000 mg | ORAL_TABLET | Freq: Every day | ORAL | Status: DC
  Administered 2020-07-28 – 2020-07-30 (×3): 54 mg via ORAL
  Filled 2020-07-27 (×3): qty 1

## 2020-07-27 MED ORDER — ALBUMIN HUMAN 5 % IV SOLN: INTRAVENOUS | Status: DC | PRN

## 2020-07-27 MED ORDER — LIDOCAINE HCL (CARDIAC) PF 100 MG/5ML IV SOSY
PREFILLED_SYRINGE | INTRAVENOUS | Status: DC | PRN
  Administered 2020-07-27: 100 mg via INTRAVENOUS

## 2020-07-27 MED ORDER — PROPOFOL 10 MG/ML IV BOLUS
INTRAVENOUS | Status: DC | PRN
  Administered 2020-07-27: 120 mg via INTRAVENOUS

## 2020-07-27 MED ORDER — SUFENTANIL CITRATE 50 MCG/ML IV SOLN
INTRAVENOUS | Status: DC | PRN
  Administered 2020-07-27: 10 ug via INTRAVENOUS

## 2020-07-27 MED ORDER — BISACODYL 5 MG PO TBEC: 5.0000 mg | DELAYED_RELEASE_TABLET | Freq: Every day | ORAL | Status: DC | PRN

## 2020-07-27 MED ORDER — SODIUM CHLORIDE 0.9 % IV SOLN: INTRAVENOUS | Status: DC | PRN

## 2020-07-27 MED ORDER — OXYCODONE-ACETAMINOPHEN 5-325 MG PO TABS
1.0000 | ORAL_TABLET | ORAL | Status: DC | PRN
  Administered 2020-07-27: 1 via ORAL
  Administered 2020-07-28 – 2020-07-29 (×5): 2 via ORAL
  Administered 2020-07-30: 1 via ORAL
  Filled 2020-07-27: qty 1
  Filled 2020-07-27: qty 2
  Filled 2020-07-27: qty 1
  Filled 2020-07-27 (×4): qty 2

## 2020-07-27 MED ORDER — PHENYLEPHRINE 40 MCG/ML (10ML) SYRINGE FOR IV PUSH (FOR BLOOD PRESSURE SUPPORT)
PREFILLED_SYRINGE | INTRAVENOUS | Status: AC
  Filled 2020-07-27: qty 10

## 2020-07-27 MED ORDER — PROTAMINE SULFATE 10 MG/ML IV SOLN
INTRAVENOUS | Status: DC | PRN
  Administered 2020-07-27: 10 mg via INTRAVENOUS
  Administered 2020-07-27: 40 mg via INTRAVENOUS

## 2020-07-27 MED ORDER — PHENYLEPHRINE HCL-NACL 10-0.9 MG/250ML-% IV SOLN
INTRAVENOUS | Status: DC | PRN
  Administered 2020-07-27: 50 ug/min via INTRAVENOUS

## 2020-07-27 MED ORDER — MIDAZOLAM HCL 2 MG/2ML IJ SOLN
INTRAMUSCULAR | Status: AC
  Filled 2020-07-27: qty 2

## 2020-07-27 MED ORDER — ATORVASTATIN CALCIUM 80 MG PO TABS
80.0000 mg | ORAL_TABLET | Freq: Every day | ORAL | Status: DC
  Administered 2020-07-27 – 2020-07-29 (×3): 80 mg via ORAL
  Filled 2020-07-27 (×3): qty 1

## 2020-07-27 MED ORDER — CHLORHEXIDINE GLUCONATE CLOTH 2 % EX PADS: 6.0000 | MEDICATED_PAD | Freq: Once | CUTANEOUS | Status: DC

## 2020-07-27 MED ORDER — SODIUM CHLORIDE 0.9 % IV SOLN: 500.0000 mL | Freq: Once | INTRAVENOUS | Status: DC | PRN

## 2020-07-27 MED ORDER — MORPHINE SULFATE (PF) 2 MG/ML IV SOLN
2.0000 mg | INTRAVENOUS | Status: DC | PRN
  Administered 2020-07-28: 2 mg via INTRAVENOUS
  Filled 2020-07-27: qty 1

## 2020-07-27 MED ORDER — MAGNESIUM SULFATE 2 GM/50ML IV SOLN: 2.0000 g | Freq: Every day | INTRAVENOUS | Status: DC | PRN

## 2020-07-27 MED ORDER — LABETALOL HCL 5 MG/ML IV SOLN
10.0000 mg | INTRAVENOUS | Status: DC | PRN
  Filled 2020-07-27: qty 4

## 2020-07-27 MED ORDER — LACTATED RINGERS IV SOLN: INTRAVENOUS | Status: DC

## 2020-07-27 MED ORDER — ROCURONIUM BROMIDE 10 MG/ML (PF) SYRINGE
PREFILLED_SYRINGE | INTRAVENOUS | Status: AC
  Filled 2020-07-27: qty 10

## 2020-07-27 MED ORDER — FENTANYL CITRATE (PF) 100 MCG/2ML IJ SOLN
INTRAMUSCULAR | Status: AC
  Administered 2020-07-27: 50 ug via INTRAVENOUS
  Filled 2020-07-27: qty 2

## 2020-07-27 MED ORDER — GABAPENTIN 100 MG PO CAPS
100.0000 mg | ORAL_CAPSULE | Freq: Two times a day (BID) | ORAL | Status: DC | PRN
Start: 2020-07-27 — End: 2020-07-27

## 2020-07-27 MED ORDER — PHENOL 1.4 % MT LIQD: 1.0000 | OROMUCOSAL | Status: DC | PRN

## 2020-07-27 MED ORDER — ORAL CARE MOUTH RINSE: 15.0000 mL | Freq: Once | OROMUCOSAL | Status: AC

## 2020-07-27 MED ORDER — INSULIN REGULAR(HUMAN) IN NACL 100-0.9 UT/100ML-% IV SOLN
INTRAVENOUS | Status: DC
  Filled 2020-07-27: qty 100

## 2020-07-27 MED ORDER — ONDANSETRON HCL 4 MG/2ML IJ SOLN
INTRAMUSCULAR | Status: AC
  Filled 2020-07-27: qty 2

## 2020-07-27 MED ORDER — DOCUSATE SODIUM 100 MG PO CAPS
100.0000 mg | ORAL_CAPSULE | Freq: Every day | ORAL | Status: DC
  Administered 2020-07-28 – 2020-07-30 (×3): 100 mg via ORAL
  Filled 2020-07-27 (×3): qty 1

## 2020-07-27 MED ORDER — INSULIN REGULAR(HUMAN) IN NACL 100-0.9 UT/100ML-% IV SOLN
INTRAVENOUS | Status: DC | PRN
  Administered 2020-07-27: 9 [IU]/h via INTRAVENOUS

## 2020-07-27 MED ORDER — HEMOSTATIC AGENTS (NO CHARGE) OPTIME
TOPICAL | Status: DC | PRN
  Administered 2020-07-27: 1 via TOPICAL

## 2020-07-27 MED ORDER — CHLORTHALIDONE 25 MG PO TABS
25.0000 mg | ORAL_TABLET | Freq: Every day | ORAL | Status: DC
  Administered 2020-07-28 – 2020-07-30 (×3): 25 mg via ORAL
  Filled 2020-07-27 (×4): qty 1

## 2020-07-27 MED ORDER — ONDANSETRON HCL 4 MG/2ML IJ SOLN: 4.0000 mg | Freq: Four times a day (QID) | INTRAMUSCULAR | Status: DC | PRN

## 2020-07-27 MED ORDER — SODIUM CHLORIDE 0.9 % IV SOLN
INTRAVENOUS | Status: AC
  Filled 2020-07-27: qty 1.2

## 2020-07-27 MED ORDER — SODIUM CHLORIDE 0.9 % IV SOLN
INTRAVENOUS | Status: DC | PRN
  Administered 2020-07-27: 500 mL

## 2020-07-27 MED ORDER — INSULIN GLARGINE 100 UNIT/ML ~~LOC~~ SOLN
60.0000 [IU] | Freq: Every day | SUBCUTANEOUS | Status: DC
  Administered 2020-07-28 – 2020-07-30 (×3): 60 [IU] via SUBCUTANEOUS
  Filled 2020-07-27 (×3): qty 0.6

## 2020-07-27 MED ORDER — 0.9 % SODIUM CHLORIDE (POUR BTL) OPTIME
TOPICAL | Status: DC | PRN
  Administered 2020-07-27: 2000 mL

## 2020-07-27 MED ORDER — GABAPENTIN 100 MG PO CAPS: 100.0000 mg | ORAL_CAPSULE | Freq: Two times a day (BID) | ORAL | Status: DC | PRN

## 2020-07-27 MED ORDER — ONDANSETRON HCL 4 MG/2ML IJ SOLN: 4.0000 mg | Freq: Once | INTRAMUSCULAR | Status: DC | PRN

## 2020-07-27 MED ORDER — ONDANSETRON HCL 4 MG/2ML IJ SOLN
INTRAMUSCULAR | Status: DC | PRN
  Administered 2020-07-27: 4 mg via INTRAVENOUS

## 2020-07-27 MED ORDER — POTASSIUM CHLORIDE CRYS ER 20 MEQ PO TBCR: 20.0000 meq | EXTENDED_RELEASE_TABLET | Freq: Every day | ORAL | Status: DC | PRN

## 2020-07-27 MED ORDER — INSULIN ASPART 100 UNIT/ML ~~LOC~~ SOLN
SUBCUTANEOUS | Status: AC
  Administered 2020-07-27: 2 [IU] via SUBCUTANEOUS
  Filled 2020-07-27: qty 1

## 2020-07-27 MED ORDER — PHENYLEPHRINE HCL (PRESSORS) 10 MG/ML IV SOLN
INTRAVENOUS | Status: DC | PRN
  Administered 2020-07-27: 120 ug via INTRAVENOUS

## 2020-07-27 MED ORDER — POLYETHYLENE GLYCOL 3350 17 G PO PACK: 17.0000 g | PACK | Freq: Every day | ORAL | Status: DC | PRN

## 2020-07-27 MED ORDER — CHLORHEXIDINE GLUCONATE 0.12 % MT SOLN
OROMUCOSAL | Status: AC
  Filled 2020-07-27: qty 15

## 2020-07-27 MED ORDER — METOPROLOL TARTRATE 5 MG/5ML IV SOLN: 2.0000 mg | INTRAVENOUS | Status: DC | PRN

## 2020-07-27 MED ORDER — INSULIN ASPART 100 UNIT/ML ~~LOC~~ SOLN
0.0000 [IU] | Freq: Three times a day (TID) | SUBCUTANEOUS | Status: DC
  Administered 2020-07-28: 2 [IU] via SUBCUTANEOUS
  Administered 2020-07-28 – 2020-07-29 (×3): 3 [IU] via SUBCUTANEOUS
  Administered 2020-07-29: 2 [IU] via SUBCUTANEOUS

## 2020-07-27 MED ORDER — LACTATED RINGERS IV SOLN: INTRAVENOUS | Status: DC | PRN

## 2020-07-27 MED ORDER — LOSARTAN POTASSIUM 50 MG PO TABS
100.0000 mg | ORAL_TABLET | Freq: Every morning | ORAL | Status: DC
  Administered 2020-07-28 – 2020-07-30 (×3): 100 mg via ORAL
  Filled 2020-07-27 (×3): qty 2

## 2020-07-27 MED ORDER — SODIUM CHLORIDE (PF) 0.9 % IJ SOLN
INTRAMUSCULAR | Status: AC
  Filled 2020-07-27: qty 10

## 2020-07-27 MED ORDER — ACETAMINOPHEN 325 MG PO TABS
325.0000 mg | ORAL_TABLET | ORAL | Status: DC | PRN
  Filled 2020-07-27: qty 2

## 2020-07-27 MED ORDER — FENTANYL CITRATE (PF) 100 MCG/2ML IJ SOLN: 25.0000 ug | INTRAMUSCULAR | Status: DC | PRN

## 2020-07-27 MED ORDER — EMPAGLIFLOZIN 25 MG PO TABS
25.0000 mg | ORAL_TABLET | Freq: Every day | ORAL | Status: DC
  Administered 2020-07-28 – 2020-07-30 (×3): 25 mg via ORAL
  Filled 2020-07-27 (×3): qty 1

## 2020-07-27 MED ORDER — PANTOPRAZOLE SODIUM 40 MG PO TBEC
40.0000 mg | DELAYED_RELEASE_TABLET | Freq: Every day | ORAL | Status: DC
  Administered 2020-07-28 – 2020-07-30 (×3): 40 mg via ORAL
  Filled 2020-07-27 (×3): qty 1

## 2020-07-27 MED ORDER — DEXAMETHASONE SODIUM PHOSPHATE 10 MG/ML IJ SOLN
INTRAMUSCULAR | Status: AC
  Filled 2020-07-27: qty 1

## 2020-07-27 MED ORDER — INSULIN GLARGINE-LIXISENATIDE 100-33 UNT-MCG/ML ~~LOC~~ SOPN: 60.0000 [IU] | PEN_INJECTOR | Freq: Every day | SUBCUTANEOUS | Status: DC

## 2020-07-27 MED ORDER — PROPOFOL 10 MG/ML IV BOLUS
INTRAVENOUS | Status: AC
  Filled 2020-07-27: qty 20

## 2020-07-27 MED ORDER — METFORMIN HCL ER 500 MG PO TB24
500.0000 mg | ORAL_TABLET | Freq: Two times a day (BID) | ORAL | Status: DC
  Administered 2020-07-28 – 2020-07-30 (×5): 500 mg via ORAL
  Filled 2020-07-27 (×5): qty 1

## 2020-07-27 MED ORDER — LIDOCAINE 2% (20 MG/ML) 5 ML SYRINGE
INTRAMUSCULAR | Status: AC
  Filled 2020-07-27: qty 5

## 2020-07-27 MED ORDER — SUGAMMADEX SODIUM 200 MG/2ML IV SOLN
INTRAVENOUS | Status: DC | PRN
  Administered 2020-07-27: 200 mg via INTRAVENOUS

## 2020-07-27 MED ORDER — CARVEDILOL 25 MG PO TABS
25.0000 mg | ORAL_TABLET | Freq: Two times a day (BID) | ORAL | Status: DC
  Administered 2020-07-27 – 2020-07-30 (×6): 25 mg via ORAL
  Filled 2020-07-27 (×6): qty 1

## 2020-07-27 SURGICAL SUPPLY — 72 items
ADH SKN CLS APL DERMABOND .7 (GAUZE/BANDAGES/DRESSINGS) ×1
AGENT HMST SPONGE THK3/8 (HEMOSTASIS)
BANDAGE ESMARK 6X9 LF (GAUZE/BANDAGES/DRESSINGS) IMPLANT
BLADE CLIPPER SURG (BLADE) ×2 IMPLANT
BNDG CMPR 9X6 STRL LF SNTH (GAUZE/BANDAGES/DRESSINGS) ×1
BNDG ESMARK 6X9 LF (GAUZE/BANDAGES/DRESSINGS) ×2
CANISTER SUCT 3000ML PPV (MISCELLANEOUS) ×2 IMPLANT
CLIP FOGARTY SPRING 6M (CLIP) ×1 IMPLANT
CLIP VESOCCLUDE MED 24/CT (CLIP) ×2 IMPLANT
CLIP VESOCCLUDE SM WIDE 24/CT (CLIP) ×3 IMPLANT
COVER PROBE W GEL 5X96 (DRAPES) ×1 IMPLANT
COVER WAND RF STERILE (DRAPES) ×1 IMPLANT
CUFF TOURN SGL QUICK 24 (TOURNIQUET CUFF) ×2
CUFF TOURN SGL QUICK 34 (TOURNIQUET CUFF)
CUFF TOURN SGL QUICK 42 (TOURNIQUET CUFF) IMPLANT
CUFF TRNQT CYL 24X4X16.5-23 (TOURNIQUET CUFF) IMPLANT
CUFF TRNQT CYL 34X4.125X (TOURNIQUET CUFF) IMPLANT
DERMABOND ADVANCED (GAUZE/BANDAGES/DRESSINGS) ×1
DERMABOND ADVANCED .7 DNX12 (GAUZE/BANDAGES/DRESSINGS) ×1 IMPLANT
DRAIN CHANNEL 15F RND FF W/TCR (WOUND CARE) IMPLANT
DRAPE C-ARM 42X72 X-RAY (DRAPES) ×1 IMPLANT
DRAPE HALF SHEET 40X57 (DRAPES) IMPLANT
ELECT BLADE 4.0 EZ CLEAN MEGAD (MISCELLANEOUS) ×2
ELECT REM PT RETURN 9FT ADLT (ELECTROSURGICAL) ×2
ELECTRODE BLDE 4.0 EZ CLN MEGD (MISCELLANEOUS) IMPLANT
ELECTRODE REM PT RTRN 9FT ADLT (ELECTROSURGICAL) ×1 IMPLANT
EVACUATOR SILICONE 100CC (DRAIN) IMPLANT
GLOVE BIO SURGEON STRL SZ7.5 (GLOVE) ×2 IMPLANT
GLOVE ECLIPSE 8.0 STRL XLNG CF (GLOVE) ×1 IMPLANT
GLOVE SRG 8 PF TXTR STRL LF DI (GLOVE) ×1 IMPLANT
GLOVE SS BIOGEL STRL SZ 6.5 (GLOVE) IMPLANT
GLOVE SUPERSENSE BIOGEL SZ 6.5 (GLOVE) ×1
GLOVE SURG SS PI 6.5 STRL IVOR (GLOVE) ×1 IMPLANT
GLOVE SURG UNDER POLY LF SZ8 (GLOVE) ×2
GOWN STRL REUS W/ TWL LRG LVL3 (GOWN DISPOSABLE) ×2 IMPLANT
GOWN STRL REUS W/ TWL XL LVL3 (GOWN DISPOSABLE) ×2 IMPLANT
GOWN STRL REUS W/TWL LRG LVL3 (GOWN DISPOSABLE) ×4
GOWN STRL REUS W/TWL XL LVL3 (GOWN DISPOSABLE) ×4
HEMOSTAT SNOW SURGICEL 2X4 (HEMOSTASIS) ×1 IMPLANT
HEMOSTAT SPONGE AVITENE ULTRA (HEMOSTASIS) IMPLANT
INSERT FOGARTY SM (MISCELLANEOUS) IMPLANT
KIT BASIN OR (CUSTOM PROCEDURE TRAY) ×2 IMPLANT
KIT TURNOVER KIT B (KITS) ×2 IMPLANT
LOOP VESSEL MINI RED (MISCELLANEOUS) ×1 IMPLANT
NS IRRIG 1000ML POUR BTL (IV SOLUTION) ×4 IMPLANT
PACK PERIPHERAL VASCULAR (CUSTOM PROCEDURE TRAY) ×2 IMPLANT
PAD ARMBOARD 7.5X6 YLW CONV (MISCELLANEOUS) ×4 IMPLANT
PENCIL BUTTON HOLSTER BLD 10FT (ELECTRODE) ×2 IMPLANT
SET MICROPUNCTURE 5F STIFF (MISCELLANEOUS) IMPLANT
SPONGE LAP 18X18 RF (DISPOSABLE) ×2 IMPLANT
STOPCOCK 4 WAY LG BORE MALE ST (IV SETS) IMPLANT
SUT ETHILON 3 0 PS 1 (SUTURE) IMPLANT
SUT GORETEX 5 0 TT13 24 (SUTURE) IMPLANT
SUT GORETEX 6.0 TT13 (SUTURE) IMPLANT
SUT MNCRL AB 4-0 PS2 18 (SUTURE) ×8 IMPLANT
SUT PROLENE 5 0 C 1 24 (SUTURE) ×4 IMPLANT
SUT PROLENE 6 0 BV (SUTURE) ×4 IMPLANT
SUT PROLENE 7 0 BV 1 (SUTURE) IMPLANT
SUT SILK 2 0 PERMA HAND 18 BK (SUTURE) ×1 IMPLANT
SUT SILK 3 0 (SUTURE) ×6
SUT SILK 3-0 18XBRD TIE 12 (SUTURE) IMPLANT
SUT VIC AB 2-0 CT1 27 (SUTURE) ×10
SUT VIC AB 2-0 CT1 TAPERPNT 27 (SUTURE) ×2 IMPLANT
SUT VIC AB 3-0 SH 27 (SUTURE) ×12
SUT VIC AB 3-0 SH 27X BRD (SUTURE) ×3 IMPLANT
SYR 50ML LL SCALE MARK (SYRINGE) ×1 IMPLANT
TAPE UMBILICAL COTTON 1/8X30 (MISCELLANEOUS) IMPLANT
TOWEL GREEN STERILE (TOWEL DISPOSABLE) ×2 IMPLANT
TRAY FOLEY MTR SLVR 16FR STAT (SET/KITS/TRAYS/PACK) ×2 IMPLANT
TUBING EXTENTION W/L.L. (IV SETS) IMPLANT
UNDERPAD 30X36 HEAVY ABSORB (UNDERPADS AND DIAPERS) ×2 IMPLANT
WATER STERILE IRR 1000ML POUR (IV SOLUTION) ×2 IMPLANT

## 2020-07-27 NOTE — Anesthesia Procedure Notes (Signed)
Procedure Name: Intubation Date/Time: 07/27/2020 8:49 AM Performed by: Melina Schools, CRNA Pre-anesthesia Checklist: Patient identified, Emergency Drugs available, Suction available, Patient being monitored and Timeout performed Patient Re-evaluated:Patient Re-evaluated prior to induction Oxygen Delivery Method: Circle system utilized Preoxygenation: Pre-oxygenation with 100% oxygen Induction Type: IV induction Ventilation: Mask ventilation without difficulty Grade View: Grade II Tube size: 8.0 mm Number of attempts: 1 Airway Equipment and Method: Stylet Placement Confirmation: ETT inserted through vocal cords under direct vision,  positive ETCO2 and breath sounds checked- equal and bilateral Secured at: 23 cm Tube secured with: Tape Dental Injury: Teeth and Oropharynx as per pre-operative assessment

## 2020-07-27 NOTE — H&P (Signed)
History and Physical Interval Note:  07/27/2020 8:25 AM  [name] [name]  has presented today for surgery, with the diagnosis of PVD.  The various methods of treatment have been discussed with the patient and family. After consideration of risks, benefits and other options for treatment, the patient has consented to  Procedure(s): LEFT FEMORAL-POPLITEAL ARTERY BYPASS GRAFTING (Left) as a surgical intervention.  The patient's history has been reviewed, patient examined, no change in status, stable for surgery.  I have reviewed the patient's chart and labs.  Questions were answered to the patient's satisfaction.    Left fem pop for CLI  [name]   [facility] [place]  [name]        MRN: [id number]        DOB: Mar 05, 1946          Sex: male    HPI: [name] [name] is a 75 y.o. male presenting to the emergency room as directed by his primary care physician due to ischemia in his left foot.  He has a long history of coronary disease with multiple prior coronary angioplasties.  He reports he has had total of 4 myocardial infarctions.  Does have a history of diabetes and hyperlipidemia.  He has a many year history of bilateral lower extremity claudication.  Over the past several weeks he has had progression to rest pain in his left foot.  He reports that he is awakened several times during the night and either has to walk or dangle his left foot over the edge of the bed with temporary relief.  He has had no tissue loss.      Past Medical History:  Diagnosis Date  . CAP (community acquired pneumonia) 10/11/2017  . Coronary artery disease    a. Cath: 2000 nonobs dz. b. 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 s/p BMS to LAD & RCA ; s/p PCI/DES dRCA & PDA x2 2011. d. cath 02/17/14: LM patent, LAD calcified, diffuse irregs, no high grade stenosis mLAD stent patent, LCx, diffuse irregs no high-grade dz, RCA total occ  w/ L to R collats, medical Rx rec  . Diabetes mellitus   . Diastolic dysfunction    a. echo 02/18/14: EF 45-50%, mild LVH, AK of inferior and inferoseptal myocardium, GR1DD  . Hiatal hernia   . Hyperlipidemia   . LV dysfunction   . NSTEMI (non-ST elevated myocardial infarction) (HCC) 10/07/2017  . PVD (peripheral vascular disease) (HCC)    ABIs of 0.69 on right and 0.93 on left  . Renal artery stenosis (HCC)    Mild, bilateral  . S/P angioplasty with stent, atherectomy DES to pLAD and DES to [facility] 10/10/17  10/11/2017  . Sleep apnea    Mild         Family History  Problem Relation Age of Onset  . Heart attack Father 89  . Cancer Mother   . Cancer Brother     SOCIAL HISTORY: Social History        Tobacco Use  . Smoking status: Former Smoker    Quit date: 06/19/2003    Years since quitting: 17.0  . Smokeless tobacco: Never Used  Substance Use Topics  . Alcohol use: No         Allergies  Allergen Reactions  . Bee Venom Swelling  . Penicillins Itching    Did it involve swelling of the face/tongue/throat, SOB, or low BP? Yes-swelling Did it  involve sudden or severe rash/hives, skin peeling, or any reaction on the inside of your mouth or nose? No-itching Did you need to seek medical attention at a hospital or doctor's office? Yes When did it last happen? Over 10 years  If all above answers are "NO", may proceed with cephalosporin use.   Marland Kitchen Plavix [Clopidogrel Bisulfate] Other (See Comments)    Patient is noted to be a nonresponder to Plavix    No current facility-administered medications for this encounter.         Current Outpatient Medications  Medication Sig Dispense Refill  . allopurinol (ZYLOPRIM) 100 MG tablet Take 100 mg by mouth every morning.     Marland Kitchen amLODipine (NORVASC) 10 MG tablet Take 1 tablet (10 mg total) by mouth daily. (Patient taking differently: Take 10 mg by mouth every morning. ) 90 tablet 3  . aspirin 81 MG  tablet Take 81 mg by mouth every morning.     Marland Kitchen atorvastatin (LIPITOR) 80 MG tablet TAKE 1 TABLET BY MOUTH EVERY DAY 90 tablet 3  . carvedilol (COREG) 25 MG tablet TAKE 1 TABLET BY MOUTH TWO TIMES DAILY WITH A MEAL 180 tablet 3  . chlorthalidone (HYGROTON) 25 MG tablet Take 1 tablet (25 mg total) by mouth daily. 90 tablet 3  . fenofibrate (TRICOR) 145 MG tablet Take 1 tablet (145 mg total) by mouth daily. 90 tablet 3  . insulin NPH-regular Human (NOVOLIN 70/30) (70-30) 100 UNIT/ML injection Inject 48 Units into the skin 2 (two) times daily.    . isosorbide mononitrate (IMDUR) 120 MG 24 hr tablet Take 1 tablet (120 mg total) by mouth daily. 90 tablet 3  . losartan (COZAAR) 100 MG tablet Take 100 mg by mouth every morning.     . metFORMIN (GLUCOPHAGE) 500 MG tablet Take 500 mg by mouth 2 (two) times daily.     . nitroGLYCERIN (NITROSTAT) 0.4 MG SL tablet Place 1 tablet (0.4 mg total) under the tongue every 5 (five) minutes as needed for chest pain. 25 tablet 11  . omeprazole (PRILOSEC) 20 MG capsule Take 20 mg by mouth every morning.       REVIEW OF SYSTEMS:  Reviewed and as history and physical with nothing to add  PHYSICAL EXAM:    Vitals:   07/19/20 1423  BP: (!) 161/67  Pulse: (!) 102  Resp: 18  Temp: 99.1 F (37.3 C)  TempSrc: Oral  SpO2: 95%    GENERAL: The patient is a well-nourished male, in no acute distress. The vital signs are documented above. CARDIOVASCULAR: 2+ radial pulses bilaterally.  I do not palpate femoral pulses.  Patient is unfortunately in a hallway making examination somewhat difficulty due to the Covid surge.  He has no femoral popliteal or distal pulses. PULMONARY: There is good air exchange  MUSCULOSKELETAL: There are no major deformities or cyanosis. NEUROLOGIC: No focal weakness or paresthesias are detected. SKIN: There are no ulcers or rashes noted.  Does have dependent rubor in his left foot.  No ulcerations PSYCHIATRIC: The patient has  a normal affect.  DATA:  Prior noninvasive studies from 2020 revealed noncompressible vessels with monophasic flow  MEDICAL ISSUES: Long history of lower extremity claudication now advanced to rest pain on his left foot.  He does have motor and sensory function intact in his left foot.  This is been present for several weeks.  He will be discharged from the emergency room this evening and will be admitted later this week for outpatient arteriography.  He understands that the may have a ability for endovascular treatment.  Also understands this may require surgical treatment.  My office will contact him tomorrow to schedule arteriography    Larina Earthly, MD [facility] Tel [phone]

## 2020-07-27 NOTE — Anesthesia Postprocedure Evaluation (Signed)
Anesthesia Post Note  Patient: [name] [name]  Procedure(s) Performed: LEFT FEMORAL TO BELOW KNEE POPLITEAL ARTERY BYPASS (Left Leg Upper) VEIN HARVEST USING  GREATER SAPHENOUS VEIN (Left Leg Upper)     Patient location during evaluation: PACU Anesthesia Type: General Level of consciousness: awake and alert Pain management: pain level controlled Vital Signs Assessment: post-procedure vital signs reviewed and stable Respiratory status: spontaneous breathing, nonlabored ventilation, respiratory function stable and patient connected to nasal cannula oxygen Cardiovascular status: blood pressure returned to baseline and stable Postop Assessment: no apparent nausea or vomiting Anesthetic complications: no   No complications documented.  Last Vitals:  Vitals:   07/27/20 1445 07/27/20 1500  BP: 122/61 127/60  Pulse: 78 84  Resp: 16 15  Temp:    SpO2: 93% 92%    Last Pain:  Vitals:   07/27/20 1407  TempSrc: Oral  PainSc: Asleep                 Sameer Teeple COKER

## 2020-07-27 NOTE — Progress Notes (Signed)
Arrived from PACU.  A&OX4 Vital signs taken. CCMD notified.  CHG bath given.

## 2020-07-27 NOTE — Anesthesia Procedure Notes (Signed)
Arterial Line Insertion Start/End2/02/2021 7:55 AM Performed by: Melina Schools, CRNA, CRNA  Patient location: Pre-op. Preanesthetic checklist: patient identified, IV checked and pre-op evaluation Lidocaine 1% used for infiltration Left, radial was placed Catheter size: 20 G Hand hygiene performed  and maximum sterile barriers used   Attempts: 1 Procedure performed without using ultrasound guided technique. Following insertion, dressing applied and Biopatch. Post procedure assessment: normal

## 2020-07-27 NOTE — Transfer of Care (Signed)
Immediate Anesthesia Transfer of Care Note  Patient: [name] [name]  Procedure(s) Performed: LEFT FEMORAL TO BELOW KNEE POPLITEAL ARTERY BYPASS (Left Leg Upper) VEIN HARVEST USING  GREATER SAPHENOUS VEIN (Left Leg Upper)  Patient Location: PACU  Anesthesia Type:General  Level of Consciousness: oriented, drowsy, patient cooperative and responds to stimulation  Airway & Oxygen Therapy: Patient Spontanous Breathing and Patient connected to nasal cannula oxygen  Post-op Assessment: Report given to RN, Post -op Vital signs reviewed and stable and Patient moving all extremities  Post vital signs: Reviewed and stable  Last Vitals:  Vitals Value Taken Time  BP 111/54 07/27/20 1233  Temp    Pulse 78 07/27/20 1235  Resp 14 07/27/20 1235  SpO2 91 % 07/27/20 1235  Vitals shown include unvalidated device data.  Last Pain:  Vitals:   07/27/20 0702  TempSrc:   PainSc: 0-No pain         Complications: No complications documented.

## 2020-07-27 NOTE — Op Note (Addendum)
Date: July 27, 2020  Preoperative diagnosis: Critical limb ischemia of the left lower extremity with rest pain  Postoperative diagnosis: Same  Procedure: 1.  Harvest of left leg great saphenous vein 2.  Left common femoral to below-knee popliteal artery bypass with ipsilateral nonreversed great saphenous vein  Surgeon: Dr. [name], MD  Assistant: Dr. Lemar Livings, MD and Wendi Maya, PA  Indications: Patient is a 75 year old male who was seen in the ED by Dr. Arbie Cookey several weeks ago with history of claudication progressing to rest pain.  He underwent arteriogram last week that showed flush SFA occlusion on the left that was long segment and reconstitution of a below-knee popliteal artery for bypass target.  He presents today for left common femoral to below-knee popliteal bypass after risk benefits discussed.  An assistant was needed for exposure and to expedite the case.  Findings: The left great saphenous vein was of excellent caliber and dilated nicely after it was harvested.  We elected a nonreversed bypass from the common femoral artery to the below-knee popliteal artery tunneled subfascial subsartorial.  The valves were lysed prior to tunneling it and there was excellent pulsatile flow distally.  We did use a tourniquet for the below-knee popliteal anastomosis given he had heavily calcified vessels.  He has triphasic popliteal signal distal to the bypass with an excellent PT signal at the ankle at completion.  Anesthesia: General  EBL: 200 mL  Details: Patient was taken to the operating room after informed consent was obtained.  Placed on the operative table supine position.  General endotracheal anesthesia was induced.  Ultimately I went ahead and marked out the vein in the left leg and this appeared to be of good caliber and I marked it from the saphenofemoral junction down to the mid calf.  The left leg was then prepped and draped in the standard sterile fashion.   Timeout was performed.  Antibiotics were given.  I initially started in the left groin with a horizontal groin incision and dissected down through the subcutaneous tissue with Bovie cautery and opened the femoral sheath longitudinal isolating the common femoral as well as the SFA and profunda.  Common femoral was very soft anteriorly and appeared adequate for sewing without endarterectomy and there was a good femoral pulse.  While I was dissecting out the groin my partner Dr. Randie Heinz exposed the below-knee popliteal artery and made a longitudinal incision on the medial calf one fingerbreadth posterior to the tibia.  He ultimately entered the popliteal space and used Meyerding retractors for added visualization dissecting the popliteal veins away from the popliteal artery and this appeared heavily calcified but there was a soft segment more proximal that was soft for sewing.  He did ligate the anterior tibial vein and the distal tibials all appeared calcified.  We then made multiple skip incisions down the left leg and the vein was harvested.  All side branches were ligated between 3-0 silk ties and divided and ultimately we freed up the vein from the saphenofemoral junction down to the mid calf.  I then placed a right angle clamp and saphenofemoral junction and the saphenous vein was transected.  We passed the vein through all the skin tunnels and then it was transected in the calf and oversewn with a 2-0 silk.  Saphenofemoral junction was then oversewn with a 5-0 Prolene baseball stitch.  We then reversed the vein and placed a vessel cannula and we did have to make several repairs using 6-0 Prolene  as well as 3-0 silk ties.  Ultimately the vein dilated nicely and appeared to be of good caliber.  Given the mismatch I elected to do a nonreversed bypass.  I then spatulated the vein and the patient was given 100 units/kg heparin IV after we tunneled from the below-knee popliteal space up to the groin incision in a  subfascial subsartorial plane.  Ultimately I used a Henley clamp on the distal external iliac and then Vesseloops on the profunda.  I opened the common femoral 11 blade scalpel extended with Potts scissors.  Initially we did not have adequate control and had to find a second profunda branch posteriorly but I was able to get a vessel loop around.  Then an end to side anastomosis was sewn to the left common femoral artery with 5-0 Prolene parachute technique.  Once we came off clamps everything was de-aired.  We had no pulsatile flow in the bypass given the vein valves had not been lysed at this point.  I then used a valvulotome to carefully lyse all the valves and we had a nice pulse in the bypass with good pulsatile flow to the foot distally in the vein graft.  We marked the vein after putting 2 clips on the distal end.  This was then passed through the tunneler being careful not to twist it.  That point in time the leg was straightened and I marked the vein to the appropriate leg length.  I then placed a tourniquet on the upper thigh and  we inflated the tourniquet to 250 mmHg after the leg was exsanguinated.  I then cut the vein to the appropriate length and spatulated it and the below-knee popliteal artery was opened with 11 blade scalpel extended Potts scissors and end-to-side anastomosis was sewn with 6-0 Prolene parachute technique.  We did de-air everything prior to completing the anastomosis and brought down the tourniquet.  We had excellent Doppler flow in the below-knee popliteal artery distal to the bypass that went away when the bypass was occluded.  We also had a good posterior tibial signal in the foot where he has dominant runoff.  Patient was given 50 mg protamine for reversal.  All incisions were irrigated out the groin and below knee popliteal incision was closed with multiple layers of 2-0 Vicryl, 3-0 Vicryl, 4-0 Monocryl and Dermabond and the vein harvest incisions were closed with 3-0 Vicryl,  4-0 Monocryl, and Dermabond..  Complication: None  Condition: Stable  [name], MD [facility] Office: [phone]   [name]

## 2020-07-28 ENCOUNTER — Encounter (HOSPITAL_COMMUNITY): Payer: Self-pay | Admitting: Vascular Surgery

## 2020-07-28 LAB — CBC
HCT: 31.7 % — ABNORMAL LOW (ref 39.0–52.0)
Hemoglobin: 10.5 g/dL — ABNORMAL LOW (ref 13.0–17.0)
MCH: 28.5 pg (ref 26.0–34.0)
MCHC: 33.1 g/dL (ref 30.0–36.0)
MCV: 85.9 fL (ref 80.0–100.0)
Platelets: 188 10*3/uL (ref 150–400)
RBC: 3.69 MIL/uL — ABNORMAL LOW (ref 4.22–5.81)
RDW: 13.9 % (ref 11.5–15.5)
WBC: 11.2 10*3/uL — ABNORMAL HIGH (ref 4.0–10.5)
nRBC: 0 % (ref 0.0–0.2)

## 2020-07-28 LAB — GLUCOSE, CAPILLARY
Glucose-Capillary: 152 mg/dL — ABNORMAL HIGH (ref 70–99)
Glucose-Capillary: 193 mg/dL — ABNORMAL HIGH (ref 70–99)
Glucose-Capillary: 140 mg/dL — ABNORMAL HIGH (ref 70–99)
Glucose-Capillary: 147 mg/dL — ABNORMAL HIGH (ref 70–99)

## 2020-07-28 LAB — BASIC METABOLIC PANEL
Anion gap: 10 (ref 5–15)
BUN: 23 mg/dL (ref 8–23)
CO2: 21 mmol/L — ABNORMAL LOW (ref 22–32)
Calcium: 8.1 mg/dL — ABNORMAL LOW (ref 8.9–10.3)
Chloride: 108 mmol/L (ref 98–111)
Creatinine, Ser: 0.99 mg/dL (ref 0.61–1.24)
GFR, Estimated: >60 mL/min (ref >60)
Glucose, Bld: 182 mg/dL — ABNORMAL HIGH (ref 70–99)
Potassium: 3.5 mmol/L (ref 3.5–5.1)
Sodium: 139 mmol/L (ref 135–145)

## 2020-07-28 LAB — LIPID PANEL
Cholesterol: 70 mg/dL (ref 0–200)
HDL: 17 mg/dL — ABNORMAL LOW (ref >40)
LDL Cholesterol: 9 mg/dL (ref 0–99)
Total CHOL/HDL Ratio: 4.1 RATIO
Triglycerides: 219 mg/dL — ABNORMAL HIGH (ref <150)
VLDL: 44 mg/dL — ABNORMAL HIGH (ref 0–40)

## 2020-07-28 MED ORDER — HEPARIN SODIUM (PORCINE) 5000 UNIT/ML IJ SOLN
5000.0000 [IU] | Freq: Three times a day (TID) | INTRAMUSCULAR | Status: DC
  Administered 2020-07-28 – 2020-07-30 (×7): 5000 [IU] via SUBCUTANEOUS
  Filled 2020-07-28 (×7): qty 1

## 2020-07-28 NOTE — Evaluation (Signed)
Occupational Therapy Evaluation Patient Details Name: [name] [name] MRN: [id number] DOB: 04-28-1946 Today's Date: 07/28/2020    History of Present Illness [name] [name] is a 75 y.o. male presenting to the emergency room as directed by his primary care physician due to ischemia in his left foot.  He has a long history of coronary disease with multiple prior coronary angioplasties.  He reports he has had total of 4 myocardial infarctions.  Does have a history of diabetes and hyperlipidemia.  He has a many year history of bilateral lower extremity claudication. Underwent   Left common femoral to below-knee popliteal artery bypass with ipsilateral nonreversed great saphenous vein.   Clinical Impression   Patient admitted with the diagnosis and procedure above.  Deficits are listed below.  Currently he is needing up to Min A for basic mobility, and up to Mod A with lower body ADL.  PTA, he was independent with all care and mobility.  OT will follow in the acute setting to maximize functional status for eventual return home with [facility] OT if needed.      Follow Up Recommendations  Home health OT    Equipment Recommendations  3 in 1 bedside commode;Tub/shower bench    Recommendations for Other Services       Precautions / Restrictions Precautions Precautions: Fall Restrictions Weight Bearing Restrictions: No      Mobility Bed Mobility Overal bed mobility: Needs Assistance Bed Mobility: Supine to Sit;Sit to Supine     Supine to sit: Min assist Sit to supine: Min assist   General bed mobility comments: assist advancing L leg    Transfers Overall transfer level: Needs assistance Equipment used: Rolling walker (2 wheeled) Transfers: Sit to/from [employer] Sit to Stand: Min assist Stand pivot transfers: Min assist       General transfer comment: Pt needed mod assist to power up.  Needed steadying assist up to mod assist for pivot. Pt tried to put left foot on  floor to pivot.  Pt needs incr steadying.Only able to pivot due to pain left foot/LE.    Balance Overall balance assessment: Needs assistance Sitting-balance support: Feet supported;Bilateral upper extremity supported Sitting balance-Leahy Scale: Poor Sitting balance - Comments: needed bil UE support to sit EOB   Standing balance support: Bilateral upper extremity supported;During functional activity Standing balance-Leahy Scale: Poor Standing balance comment: relied heavily on UE support and external support                           ADL either performed or assessed with clinical judgement   ADL Overall ADL's : Needs assistance/impaired Eating/Feeding: Independent;Sitting   Grooming: Wash/dry hands;Supervision/safety;Standing   Upper Body Bathing: Set up;Sitting   Lower Body Bathing: Moderate assistance;Sit to/from stand   Upper Body Dressing : Set up;Sitting   Lower Body Dressing: Moderate assistance;Sit to/from stand   Toilet Transfer: Minimal assistance;RW           Functional mobility during ADLs: Minimal assistance;Rolling walker       Vision Patient Visual Report: No change from baseline       Perception     Praxis      Pertinent Vitals/Pain Pain Assessment: Faces Faces Pain Scale: Hurts even more Pain Location: left foot/leg Pain Descriptors / Indicators: Grimacing;Guarding;Tender;Tightness Pain Intervention(s): Monitored during session     Hand Dominance Right   Extremity/Trunk Assessment Upper Extremity Assessment Upper Extremity Assessment: Overall WFL for tasks assessed   Lower Extremity Assessment  Lower Extremity Assessment: Defer to PT evaluation LLE Deficits / Details: grossly 3-/5 LLE: Unable to fully assess due to pain   Cervical / Trunk Assessment Cervical / Trunk Assessment: Kyphotic   Communication Communication Communication: No difficulties   Cognition Arousal/Alertness: Awake/alert Behavior During Therapy: WFL  for tasks assessed/performed Overall Cognitive Status: Within Functional Limits for tasks assessed                                     General Comments  93% O2 on RA.      Exercises Exercises: General Lower Extremity General Exercises - Lower Extremity Ankle Circles/Pumps: AROM;Both;5 reps;AAROM;Supine Heel Slides: AAROM;Both;10 reps;Supine   Shoulder Instructions      Home Living Family/patient expects to be discharged to:: Private residence Living Arrangements: Spouse/significant other Available Help at Discharge: Family;Available 24 hours/day Type of Home: House Home Access: Level entry     Home Layout: One level     Bathroom Shower/Tub: Chief Strategy Officer: Standard Bathroom Accessibility: Yes How Accessible: Accessible via walker Home Equipment: None          Prior Functioning/Environment Level of Independence: Independent                 OT Problem List: Decreased range of motion;Impaired balance (sitting and/or standing);Decreased knowledge of use of DME or AE;Pain      OT Treatment/Interventions: Self-care/ADL training;DME and/or AE instruction;Balance training;Patient/family education;Therapeutic activities    OT Goals(Current goals can be found in the care plan section) Acute Rehab OT Goals Patient Stated Goal: hurt a little less and move better. OT Goal Formulation: With patient Time For Goal Achievement: 08/11/20 Potential to Achieve Goals: Good ADL Goals Pt Will Perform Grooming: with modified independence;sitting;standing Pt Will Perform Lower Body Bathing: with modified independence;sit to/from stand Pt Will Perform Lower Body Dressing: with modified independence;sit to/from stand Pt Will Transfer to Toilet: with modified independence;ambulating;regular height toilet;bedside commode Pt Will Perform Toileting - Clothing Manipulation and hygiene: with modified independence;sit to/from stand  OT Frequency: Min  2X/week   Barriers to D/C:    none noted       Co-evaluation              AM-PAC OT "6 Clicks" Daily Activity     Outcome Measure Help from another person eating meals?: None Help from another person taking care of personal grooming?: A Little Help from another person toileting, which includes using toliet, bedpan, or urinal?: A Little Help from another person bathing (including washing, rinsing, drying)?: A Lot Help from another person to put on and taking off regular upper body clothing?: A Little Help from another person to put on and taking off regular lower body clothing?: A Lot 6 Click Score: 17   End of Session Equipment Utilized During Treatment: Gait belt;Rolling walker Nurse Communication: Mobility status  Activity Tolerance: Patient limited by pain Patient left: in bed;with call bell/phone within reach;with nursing/sitter in room;with family/visitor present  OT Visit Diagnosis: Unsteadiness on feet (R26.81);Pain Pain - Right/Left: Left Pain - part of body: Leg                Time: 4580-9983 OT Time Calculation (min): 19 min Charges:  OT General Charges $OT Visit: 1 Visit OT Evaluation $OT Eval Moderate Complexity: 1 Mod  07/28/2020  [name] [name], [name] [name]  Acute Rehabilitation Services  Office:  [phone]   Suzanna Obey 07/28/2020,  3:49 PM

## 2020-07-28 NOTE — Progress Notes (Signed)
Mobility Specialist - Progress Note   07/28/20 1607  Mobility  Activity Ambulated in room  Level of Assistance Minimal assist, patient does 75% or more  Assistive Device Front wheel walker  Distance Ambulated (ft) 36 ft  Mobility Response Tolerated fair  Mobility performed by Mobility specialist  $Mobility charge 1 Mobility   Pre-mobility: 84 HR, 96%  SpO2 During mobility: 102 HR, 93% SpO2 Post-mobility: 87 HR, 93% SpO2  Pt required bed height elevated to stand from bed, standby assist during ambulation. Distance limited by L LE pain. Pt min assist to lift legs when getting back in bed.   Mamie Levers Mobility Specialist Mobility Specialist Phone: [phone]

## 2020-07-28 NOTE — Progress Notes (Signed)
PHARMACIST LIPID MONITORING   [name] [name] is a 75 y.o. male admitted on 07/27/2020 with ischemic leg.  Pharmacy has been consulted to optimize lipid-lowering therapy with the indication of secondary prevention for clinical ASCVD.  Recent Labs:  Lipid Panel (last 6 months):   Lab Results  Component Value Date   CHOL 70 07/28/2020   TRIG 219 (H) 07/28/2020   HDL 17 (L) 07/28/2020   CHOLHDL 4.1 07/28/2020   VLDL 44 (H) 07/28/2020   LDLCALC 9 07/28/2020    Hepatic function panel (last 6 months):   Lab Results  Component Value Date   AST 15 07/27/2020   ALT 14 07/27/2020   ALKPHOS 48 07/27/2020   BILITOT 0.5 07/27/2020    SCr (since admission):   Serum creatinine: 0.99 mg/dL 16/10/96 0454 Estimated creatinine clearance: 77.7 mL/min  Current therapy and lipid therapy tolerance Current lipid-lowering therapy: atorvastatin 80mg  QHS Previous lipid-lowering therapies (if applicable): n/a Documented or reported allergies or intolerances to lipid-lowering therapies (if applicable): none  Assessment:   Pt has extremely well-controlled lipids (LDL 9) - will not add further LDL-lowering agents.  Plan:    1.Statin intensity (high intensity recommended for all patients regardless of the LDL):  No statin changes. The patient is already on a high intensity statin.  2.Add ezetimibe (if any one of the following):   Not indicated at this time.  3.Refer to lipid clinic:   No  4.Follow-up with:  Primary care provider - Dsa, , MD  5.Follow-up labs after discharge:  No changes in lipid therapy, repeat a lipid panel in one year.      Laurell Roof, PharmD, BCPS, [facility] Clinical Pharmacist [phone] Please check AMION for all [facility] Pharmacy numbers 07/28/2020

## 2020-07-28 NOTE — Evaluation (Signed)
Physical Therapy Evaluation Patient Details Name: [name] [name] MRN: [id number] DOB: 07-Sep-1945 Today's Date: 07/28/2020   History of Present Illness  [name] [name] is a 75 y.o. male presenting to the emergency room as directed by his primary care physician due to ischemia in his left foot.  He has a long history of coronary disease with multiple prior coronary angioplasties.  He reports he has had total of 4 myocardial infarctions.  Does have a history of diabetes and hyperlipidemia.  He has a many year history of bilateral lower extremity claudication. Underwent   Left common femoral to below-knee popliteal artery bypass with ipsilateral nonreversed great saphenous vein.  Clinical Impression  Pt admitted with above diagnosis. Pt limited by pain today but hopeful that he can progress and go home with wife.May need to use wheelchair at home or may need SNF for Rehab if he cant ambulate next visit.  Pt to talk with wife.  Pt currently with functional limitations due to the deficits listed below (see PT Problem List). Pt will benefit from skilled PT to increase their independence and safety with mobility to allow discharge to the venue listed below.    Follow Up Recommendations Home health PT;Supervision/Assistance - 24 hour (If pt doesnt progress, may need SNF as he states wife cant assist much)    Equipment Recommendations  Rolling walker with 5" wheels;3in1 (PT);Wheelchair (measurements PT);Wheelchair cushion (measurements PT) (may need wheelchair depending on progress)    Recommendations for Other Services       Precautions / Restrictions Precautions Precautions: Fall Restrictions Weight Bearing Restrictions: No      Mobility  Bed Mobility Overal bed mobility: Needs Assistance Bed Mobility: Supine to Sit     Supine to sit: Mod assist     General bed mobility comments: Mod assist for elevation of trunk and for LES to EOB.    Transfers Overall transfer level: Needs  assistance Equipment used: Rolling walker (2 wheeled) Transfers: Sit to/from [employer] Sit to Stand: Mod assist Stand pivot transfers: Mod assist       General transfer comment: Pt needed mod assist to power up.  Needed steadying assist up to mod assist for pivot. Pt tried to put left foot on floor to pivot.  Pt needs incr steadying.Only able to pivot due to pain left foot/LE.  Ambulation/Gait             General Gait Details: TBA  Stairs            Wheelchair Mobility    Modified Rankin (Stroke Patients Only)       Balance Overall balance assessment: Needs assistance Sitting-balance support: Feet supported;Bilateral upper extremity supported Sitting balance-Leahy Scale: Poor Sitting balance - Comments: needed bil UE support to sit EOB   Standing balance support: Bilateral upper extremity supported;During functional activity Standing balance-Leahy Scale: Poor Standing balance comment: relied heavily on UE support and external support                             Pertinent Vitals/Pain Pain Assessment: Faces Faces Pain Scale: Hurts whole lot Pain Location: left foot/leg Pain Descriptors / Indicators: Aching;Grimacing;Guarding Pain Intervention(s): Limited activity within patient's tolerance;Monitored during session;Repositioned    Home Living                        Prior Function  Hand Dominance        Extremity/Trunk Assessment   Upper Extremity Assessment Upper Extremity Assessment: Defer to OT evaluation    Lower Extremity Assessment Lower Extremity Assessment: LLE deficits/detail LLE Deficits / Details: grossly 3-/5 LLE: Unable to fully assess due to pain    Cervical / Trunk Assessment Cervical / Trunk Assessment: Kyphotic  Communication   Communication: No difficulties  Cognition Arousal/Alertness: Awake/alert Behavior During Therapy: WFL for tasks assessed/performed Overall  Cognitive Status: Within Functional Limits for tasks assessed                                        General Comments General comments (skin integrity, edema, etc.): 95 bpm, 95% 3LO2, 129/62    Exercises General Exercises - Lower Extremity Ankle Circles/Pumps: AROM;Both;5 reps;AAROM;Supine Heel Slides: AAROM;Both;10 reps;Supine   Assessment/Plan    PT Assessment Patient needs continued PT services  PT Problem List Decreased activity tolerance;Decreased balance;Decreased mobility;Decreased strength;Decreased range of motion;Decreased knowledge of use of DME;Decreased safety awareness;Decreased knowledge of precautions;Cardiopulmonary status limiting activity;Pain       PT Treatment Interventions DME instruction;Gait training;Functional mobility training;Therapeutic activities;Therapeutic exercise;Balance training;Patient/family education    PT Goals (Current goals can be found in the Care Plan section)  Acute Rehab PT Goals Patient Stated Goal: to go home if possible PT Goal Formulation: With patient Time For Goal Achievement: 08/11/20 Potential to Achieve Goals: Good    Frequency Min 3X/week   Barriers to discharge        Co-evaluation               AM-PAC PT "6 Clicks" Mobility  Outcome Measure Help needed turning from your back to your side while in a flat bed without using bedrails?: A Lot Help needed moving from lying on your back to sitting on the side of a flat bed without using bedrails?: A Lot Help needed moving to and from a bed to a chair (including a wheelchair)?: A Lot Help needed standing up from a chair using your arms (e.g., wheelchair or bedside chair)?: A Lot Help needed to walk in hospital room?: Total Help needed climbing 3-5 steps with a railing? : Total 6 Click Score: 10    End of Session Equipment Utilized During Treatment: Gait belt;Oxygen Activity Tolerance: Patient limited by fatigue Patient left: in chair;with call  bell/phone within reach;with chair alarm set Nurse Communication: Mobility status PT Visit Diagnosis: Muscle weakness (generalized) (M62.81);Unsteadiness on feet (R26.81)    Time: 0940-1003 PT Time Calculation (min) (ACUTE ONLY): 23 min   Charges:   PT Evaluation $PT Eval Moderate Complexity: 1 Mod PT Treatments $Gait Training: 8-22 mins        Dawn W,PT Acute Rehabilitation Services Pager:  [phone]  Office:  [phone]    Berline Lopes 07/28/2020, 1:49 PM

## 2020-07-28 NOTE — Progress Notes (Addendum)
Progress Note    07/28/2020 7:05 AM 1 Day Post-Op  Subjective:  LLE soreness. No CP, SOB, N or V. Foley just out but has not yet voided.   Vitals:   07/27/20 2334 07/28/20 0421  BP: 138/65 108/64  Pulse: 98 78  Resp: 18 17  Temp: 99.4 F (37.4 C) 98 F (36.7 C)  SpO2: 93% 91%    Physical Exam: General appearance: Awake, alert in no apparent distress Cardiac: Heart rate and rhythm are regular Respirations: Nonlabored Incisions: Groin, thigh and lower leg incisions are all well approximated without bleeding or hematoma Extremities: Both feet are warm with intact motor function. Has residual left foot numbness. Brisk dorsalis pedis, posterior tibial and peroneal artery Doppler signals.    CBC    Component Value Date/Time   WBC 11.2 (H) 07/28/2020 0123   RBC 3.69 (L) 07/28/2020 0123   HGB 10.5 (L) 07/28/2020 0123   HCT 31.7 (L) 07/28/2020 0123   PLT 188 07/28/2020 0123   MCV 85.9 07/28/2020 0123   MCH 28.5 07/28/2020 0123   MCHC 33.1 07/28/2020 0123   RDW 13.9 07/28/2020 0123   LYMPHSABS 3.0 07/20/2018 2127   MONOABS 0.5 07/20/2018 2127   EOSABS 0.1 07/20/2018 2127   BASOSABS 0.1 07/20/2018 2127    BMET    Component Value Date/Time   NA 139 07/28/2020 0123   NA 142 07/29/2018 1032   K 3.5 07/28/2020 0123   CL 108 07/28/2020 0123   CO2 21 (L) 07/28/2020 0123   GLUCOSE 182 (H) 07/28/2020 0123   BUN 23 07/28/2020 0123   BUN 22 07/29/2018 1032   CREATININE 0.99 07/28/2020 0123   CALCIUM 8.1 (L) 07/28/2020 0123   GFRNONAA >60 07/28/2020 0123   GFRAA 100 07/29/2018 1032     Intake/Output Summary (Last 24 hours) at 07/28/2020 0705 Last data filed at 07/28/2020 0520 Gross per 24 hour  Intake 2600 ml  Output 2830 ml  Net -230 ml    HOSPITAL MEDICATIONS Scheduled Meds: . allopurinol  100 mg Oral q morning  . amLODipine  10 mg Oral Daily  . aspirin EC  81 mg Oral q morning  . atorvastatin  80 mg Oral QHS  . carvedilol  25 mg Oral BID WC  .  chlorthalidone  25 mg Oral Daily  . docusate sodium  100 mg Oral Daily  . empagliflozin  25 mg Oral Daily  . fenofibrate  54 mg Oral Daily  . insulin aspart  0-15 Units Subcutaneous TID WC  . insulin glargine  60 Units Subcutaneous Daily  . isosorbide mononitrate  120 mg Oral QHS  . losartan  100 mg Oral q morning  . metFORMIN  500 mg Oral BID WC  . pantoprazole  40 mg Oral Daily   Continuous Infusions: . sodium chloride    . sodium chloride    . magnesium sulfate bolus IVPB     PRN Meds:.sodium chloride, acetaminophen **OR** acetaminophen, alum & mag hydroxide-simeth, bisacodyl, gabapentin, guaiFENesin-dextromethorphan, hydrALAZINE, labetalol, magnesium sulfate bolus IVPB, metoprolol tartrate, morphine injection, ondansetron, oxyCODONE-acetaminophen, phenol, polyethylene glycol, potassium chloride  Assessment and Plan: POD 1 Procedure: 1.  Harvest of left leg great saphenous vein 2.  Left common femoral to below-knee popliteal artery bypass with ipsilateral nonreversed great saphenous vein  VSS. Afebrile. LLE well perfused. Excellent UOP.  OOB today; PT/OT eval  -DVT prophylaxis:  Start heparin [place]   Wendi Maya, PA-C [facility] [phone] 07/28/2020  7:05 AM   I have  seen and evaluated the patient. I agree with the PA note as documented above.  75 year old male postop day 1 status post left common femoral to below-knee pop bypass with ipsilateral nonreversed saphenous vein.  Very brisk PT DP Doppler signals.  Foot is nice and warm.  All of his incisions look good.  Hemoglobin today 10.5.  Creatinine stable 0.99.  PT OT out of bed to chair.  Foley is been removed.  [name], MD [facility] [place] Office: [phone]

## 2020-07-29 LAB — GLUCOSE, CAPILLARY
Glucose-Capillary: 161 mg/dL — ABNORMAL HIGH (ref 70–99)
Glucose-Capillary: 150 mg/dL — ABNORMAL HIGH (ref 70–99)
Glucose-Capillary: 104 mg/dL — ABNORMAL HIGH (ref 70–99)
Glucose-Capillary: 137 mg/dL — ABNORMAL HIGH (ref 70–99)

## 2020-07-29 MED ORDER — OXYCODONE-ACETAMINOPHEN 5-325 MG PO TABS: 1.0000 | ORAL_TABLET | Freq: Four times a day (QID) | ORAL | 0 refills | Status: DC | PRN

## 2020-07-29 NOTE — Progress Notes (Signed)
Physical Therapy Treatment Patient Details Name: [name] [name] MRN: [id number] DOB: 1946/03/29 Today's Date: 07/29/2020    History of Present Illness [name] [name] is a 75 y.o. male presenting to the emergency room as directed by his primary care physician due to ischemia in his left foot.  He has a long history of coronary disease with multiple prior coronary angioplasties.  He reports he has had total of 4 myocardial infarctions.  Does have a history of diabetes and hyperlipidemia.  He has a many year history of bilateral lower extremity claudication. Underwent   Left common femoral to below-knee popliteal artery bypass with ipsilateral nonreversed great saphenous vein.    PT Comments    Pt just worked with Network engineer prior to PT visit, however agreeable to work with therapy "If it means I get to go home.". Pt still has appreciable L LE pain with dependent position and with weightbearing. Pt is supervision for bed mobility and min guard for transfers and ambulation of 20 feet with RW. Pt reports that wife can assist with iADLs, and pt feels that he can around okay on his own. Pt educated on need for frequent mobility and ROM of L LE to reduce edema and improve pain. D/c plans remain appropriate. PT will continue to follow acutely.     Follow Up Recommendations  Home health PT;Supervision/Assistance - 24 hour     Equipment Recommendations  Rolling walker with 5" wheels;3in1 (PT)       Precautions / Restrictions Precautions Precautions: Fall Restrictions Weight Bearing Restrictions: No    Mobility  Bed Mobility Overal bed mobility: Needs Assistance Bed Mobility: Supine to Sit     Supine to sit: Min guard     General bed mobility comments: min guard for safety, increased effort and use of bed rail to come to EoB    Transfers Overall transfer level: Needs assistance Equipment used: Rolling walker (2 wheeled) Transfers: Sit to/from [employer] Sit  to Stand: Min guard;From elevated surface         General transfer comment: min guard for safety, vc for hand placement for powerup  Ambulation/Gait Ambulation/Gait assistance: Min guard Gait Distance (Feet): 20 Feet Assistive device: Rolling walker (2 wheeled) Gait Pattern/deviations: Step-to pattern;Decreased step length - right;Decreased step length - left;Decreased weight shift to left;Decreased stance time - left;Antalgic;Trunk flexed Gait velocity: slowed Gait velocity interpretation: <1.31 ft/sec, indicative of household ambulator General Gait Details: contact guard assist for safety, slow, antalgic gait, vc for increased UE support to decrease full weightbearing through L LE to reduce pain,       Balance Overall balance assessment: Needs assistance Sitting-balance support: Feet supported;No upper extremity supported Sitting balance-Leahy Scale: Fair     Standing balance support: Bilateral upper extremity supported;During functional activity Standing balance-Leahy Scale: Poor Standing balance comment: requires  B UE support                            Cognition Arousal/Alertness: Awake/alert Behavior During Therapy: WFL for tasks assessed/performed Overall Cognitive Status: Within Functional Limits for tasks assessed                                 General Comments: can be slightly self limiting      Exercises General Exercises - Lower Extremity Ankle Circles/Pumps: AROM;Both;AAROM;Supine;20 reps    General Comments General comments (skin integrity, edema, etc.): VSS  on RA      Pertinent Vitals/Pain Pain Assessment: 0-10 Pain Score: 7  Faces Pain Scale: Hurts even more Pain Location: LLE Pain Descriptors / Indicators: Aching;Grimacing;Guarding Pain Intervention(s): Monitored during session;Repositioned           PT Goals (current goals can now be found in the care plan section) Acute Rehab PT Goals Patient Stated Goal: to go  home if possible PT Goal Formulation: With patient Time For Goal Achievement: 08/11/20 Potential to Achieve Goals: Good Progress towards PT goals: Progressing toward goals    Frequency    Min 3X/week      PT Plan Current plan remains appropriate       AM-PAC PT "6 Clicks" Mobility   Outcome Measure  Help needed turning from your back to your side while in a flat bed without using bedrails?: None Help needed moving from lying on your back to sitting on the side of a flat bed without using bedrails?: None Help needed moving to and from a bed to a chair (including a wheelchair)?: A Little Help needed standing up from a chair using your arms (e.g., wheelchair or bedside chair)?: A Little Help needed to walk in hospital room?: A Little Help needed climbing 3-5 steps with a railing? : A Lot 6 Click Score: 19    End of Session Equipment Utilized During Treatment: Gait belt Activity Tolerance: Patient tolerated treatment well;Patient limited by pain Patient left: with call bell/phone within reach;in bed;with bed alarm set Nurse Communication: Mobility status PT Visit Diagnosis: Muscle weakness (generalized) (M62.81);Unsteadiness on feet (R26.81)     Time: 7353-2992 PT Time Calculation (min) (ACUTE ONLY): 21 min  Charges:  $Gait Training: 8-22 mins                     [name] [name] B. Beverely Risen PT, DPT Acute Rehabilitation Services Pager [phone] Office [phone]    [name] [name] 07/29/2020, 3:50 PM

## 2020-07-29 NOTE — Plan of Care (Signed)
  Problem: Health Behavior/Discharge Planning: Goal: Ability to manage health-related needs will improve Outcome: Progressing   Problem: Clinical Measurements: Goal: Will remain free from infection Outcome: Progressing   

## 2020-07-29 NOTE — Progress Notes (Signed)
Occupational Therapy Treatment Patient Details Name: [name] [name] MRN: [id number] DOB: Oct 29, 1945 Today's Date: 07/29/2020    History of present illness [name] [name] is a 75 y.o. male presenting to the emergency room as directed by his primary care physician due to ischemia in his left foot.  He has a long history of coronary disease with multiple prior coronary angioplasties.  He reports he has had total of 4 myocardial infarctions.  Does have a history of diabetes and hyperlipidemia.  He has a many year history of bilateral lower extremity claudication. Underwent   Left common femoral to below-knee popliteal artery bypass with ipsilateral nonreversed great saphenous vein.   OT comments  Pt making steady progress towards OT goals this session. Session focus on functional mobility as precursor to higher level BADLs. Pt able to complete household distance functional mobility with RW and MIN assist. Pt reports he required assist for LB ADLs at baseline. Briefly introduced LB AE with pt showing little interest to AE however pt may benefit from full demonstration as well as further discussion about appropriate DME for tub shower. Pt able to complete bed mobility from flat [facility] with min guard assist. Pt would continue to benefit from skilled occupational therapy while admitted and after d/c to address the below listed limitations in order to improve overall functional mobility and facilitate independence with BADL participation. DC plan remains appropriate, will follow acutely per POC.      Follow Up Recommendations  Home health OT    Equipment Recommendations  3 in 1 bedside commode;Tub/shower bench    Recommendations for Other Services      Precautions / Restrictions Precautions Precautions: Fall Restrictions Weight Bearing Restrictions: No       Mobility Bed Mobility Overal bed mobility: Needs Assistance Bed Mobility: Supine to Sit;Sit to Supine     Supine to sit: Min guard Sit  to supine: Min guard   General bed mobility comments: pt able to transition from supine>sittting from flat [facility] with min guard assist mostly for safety and line mgmt  Transfers Overall transfer level: Needs assistance Equipment used: Rolling walker (2 wheeled) Transfers: Sit to/from Stand Sit to Stand: Min guard         General transfer comment: minguard for safety to rise    Balance Overall balance assessment: Needs assistance Sitting-balance support: Feet supported;No upper extremity supported Sitting balance-Leahy Scale: Fair Sitting balance - Comments: able to sit EOB with no UE support or LOB   Standing balance support: Bilateral upper extremity supported;During functional activity Standing balance-Leahy Scale: Poor Standing balance comment: requires  B UE support                           ADL either performed or assessed with clinical judgement   ADL Overall ADL's : Needs assistance/impaired               Lower Body Bathing Details (indicate cue type and reason): pt reports he could not complete LB ADLs at baseline     Lower Body Dressing: Maximal assistance Lower Body Dressing Details (indicate cue type and reason): pt reports he could not complete LB ADLs at baseline Toilet Transfer: Minimal assistance;RW;Ambulation Toilet Transfer Details (indicate cue type and reason): simulated via functional mobility         Functional mobility during ADLs: Minimal assistance;Rolling walker General ADL Comments: pt continues to present with increased pain, and decreased activity tolerance     Vision  Perception     Praxis      Cognition Arousal/Alertness: Awake/alert Behavior During Therapy: WFL for tasks assessed/performed Overall Cognitive Status: Within Functional Limits for tasks assessed                                 General Comments: can be slightly self limiting        Exercises General Exercises - Lower  Extremity Ankle Circles/Pumps: AROM;Both;AAROM;Supine;20 reps   Shoulder Instructions       General Comments VSS on RA    Pertinent Vitals/ Pain       Pain Assessment: 0-10 Pain Score: 7  Faces Pain Scale: Hurts even more Pain Location: LLE Pain Descriptors / Indicators: Aching;Grimacing;Guarding Pain Intervention(s): Monitored during session;Repositioned  Home Living                                          Prior Functioning/Environment              Frequency  Min 2X/week        Progress Toward Goals  OT Goals(current goals can now be found in the care plan section)  Progress towards OT goals: Progressing toward goals  Acute Rehab OT Goals Patient Stated Goal: wants to go home OT Goal Formulation: With patient Time For Goal Achievement: 08/11/20 Potential to Achieve Goals: Good  Plan Discharge plan remains appropriate;Frequency remains appropriate    Co-evaluation                 AM-PAC OT "6 Clicks" Daily Activity     Outcome Measure   Help from another person eating meals?: None Help from another person taking care of personal grooming?: A Little Help from another person toileting, which includes using toliet, bedpan, or urinal?: A Little Help from another person bathing (including washing, rinsing, drying)?: A Little Help from another person to put on and taking off regular upper body clothing?: A Little Help from another person to put on and taking off regular lower body clothing?: Total 6 Click Score: 17    End of Session Equipment Utilized During Treatment: Gait belt;Rolling walker  OT Visit Diagnosis: Unsteadiness on feet (R26.81);Pain Pain - Right/Left: Left Pain - part of body: Leg   Activity Tolerance Patient tolerated treatment well   Patient Left in bed;with call bell/phone within reach;with bed alarm set   Nurse Communication Mobility status        Time: 5361-4431 OT Time Calculation (min): 13  min  Charges: OT General Charges $OT Visit: 1 Visit OT Treatments $Self Care/Home Management : 8-22 mins  Lenor Derrick., COTA/L Acute Rehabilitation Services [phone]    Barron Schmid 07/29/2020, 3:51 PM

## 2020-07-29 NOTE — Progress Notes (Addendum)
Mobility Specialist - Progress Note   Pre-mobility: 82 HR, 96% SpO2 During mobility: 96 HR Post-mobility: 83 HR, 90% SpO2  Pt min assist to rock-to-stand from bed. No assist to get back into bed. Standby assist during ambulation.   Mamie Levers Mobility Specialist Mobility Specialist Phone: [phone]

## 2020-07-29 NOTE — Discharge Instructions (Signed)
 [facility] [place]  Discharge instructions  Lower Extremity Bypass Surgery  Please refer to the following instruction for your post-procedure care. Your surgeon or physician assistant will discuss any changes with you.  Activity  You are encouraged to walk as much as you can. You can slowly return to normal activities during the month after your surgery. Avoid strenuous activity and heavy lifting until your doctor tells you it's OK. Avoid activities such as vacuuming or swinging a golf club. Do not drive until your doctor give the OK and you are no longer taking prescription pain medications. It is also normal to have difficulty with sleep habits, eating and bowel movement after surgery. These will go away with time.  Bathing/Showering  You may shower after you go home. Do not soak in a bathtub, hot tub, or swim until the incision heals completely.  Incision Care  Clean your incision with mild soap and water. Shower every day. Pat the area dry with a clean towel. You do not need a bandage unless otherwise instructed. Do not apply any ointments or creams to your incision. If you have open wounds you will be instructed how to care for them or a visiting nurse may be arranged for you. If you have staples or sutures along your incision they will be removed at your post-op appointment. You may have skin glue on your incision. Do not peel it off. It will come off on its own in about one week. If you have a great deal of moisture in your groin, use a gauze help keep this area dry.  Diet  Resume your normal diet. There are no special food restrictions following this procedure. A low fat/ low cholesterol diet is recommended for all patients with vascular disease. In order to heal from your surgery, it is CRITICAL to get adequate nutrition. Your body requires vitamins, minerals, and protein. Vegetables are the best source of vitamins and minerals. Vegetables also provide the  perfect balance of protein. Processed food has little nutritional value, so try to avoid this.  Medications  Resume taking all your medications unless your doctor or nurse practitioner tells you not to. If your incision is causing pain, you may take over-the-counter pain relievers such as acetaminophen (Tylenol). If you were prescribed a stronger pain medication, please aware these medication can cause nausea and constipation. Prevent nausea by taking the medication with a snack or meal. Avoid constipation by drinking plenty of fluids and eating foods with high amount of fiber, such as fruits, vegetables, and grains. Take Colase 100 mg (an over-the-counter stool softener) twice a day as needed for constipation. Do not take Tylenol if you are taking prescription pain medications.  Follow Up  Our office will schedule a follow up appointment 2-3 weeks following discharge.  Please call us immediately for any of the following conditions  Severe or worsening pain in your legs or feet while at rest or while walking Increase pain, redness, warmth, or drainage (pus) from your incision site(s) Fever of 101 degree or higher The swelling in your leg with the bypass suddenly worsens and becomes more painful than when you were in the hospital If you have been instructed to feel your graft pulse then you should do so every day. If you can no longer feel this pulse, call the office immediately. Not all patients are given this instruction.  Leg swelling is common after leg bypass surgery.  The swelling should improve over a few months   following surgery. To improve the swelling, you may elevate your legs above the level of your heart while you are sitting or resting. Your surgeon or physician assistant may ask you to apply an ACE wrap or wear compression (TED) stockings to help to reduce swelling.  Reduce your risk of vascular disease  Stop smoking. If you would like help call [facility] at 1-800-QUIT-NOW  (1-800-784-8669) or [place] at 336-586-4000.  Manage your cholesterol Maintain a desired weight Control your diabetes weight Control your diabetes Keep your blood pressure down  If you have any questions, please call the office at [phone]   

## 2020-07-29 NOTE — Progress Notes (Addendum)
   07/29/20 1402  Mobility  Activity Ambulated in room  Level of Assistance Minimal assist, patient does 75% or more  Assistive Device Front wheel walker  Distance Ambulated (ft) 48 ft  Mobility Response Tolerated well  Mobility performed by Mobility specialist  $Mobility charge 1 Mobility   This table is for the visit done at 1403

## 2020-07-29 NOTE — Progress Notes (Signed)
Notified from tele that pt had a 24 beat run of SVT.  Pt asymptomatic and resting in bed at the time.

## 2020-07-29 NOTE — Discharge Summary (Incomplete)
Bypass Discharge Summary Patient ID: [name] [name] [id number] 74 y.o. Oct 27, 1945  Admit date: 07/27/2020  Discharge date and time: ***   Admitting Physician: [name], MD   Discharge Physician: same  Admission Diagnoses: PAD (peripheral artery disease) [facility]) [I73.9]  Discharge Diagnoses: same  Admission Condition: fair  Discharged Condition: fair  Indication for Admission: Critical limb ischemia of left lower extremity rest pain  Hospital Course: Mr. [name] [name] 75 year old male who was brought in as an outpatient for left femoral to below the knee popliteal artery bypass with vein by Dr. Chestine Spore on 07/27/2020 due to critical limb ischemia with rest pain.  He tolerated the procedure well and was admitted to the hospital postoperatively.  Throughout the course of his hospital stay he maintained brisk Doppler signals in his left foot.  Most of the hospital stay involved increasing mobility.  Based on therapy recommendations, home health PT was arranged.  He will follow-up with Dr. Chestine Spore in 2 to 3 weeks.  He will be prescribed 2 to 3 days of narcotic pain medication for continued postoperative pain control.  He was discharged home in stable condition.  Consults: None  Treatments: surgery: Left femoral to below the knee popliteal artery bypass with vein by Dr. Chestine Spore 07/27/2020    Disposition: Discharge disposition: 01-Home or Self Care       - For [facility] Registry use ---  Post-op:  Wound infection: No  Graft infection: No  Transfusion: No  New Arrhythmia: No Patency judged by: [x ] Dopper only, [ ]  Palpable graft pulse, [ ]  Palpable distal pulse, [ ]  ABI inc. > 0.15, [ ]  Duplex  Complications: MI: [x ] No, [ ]  Troponin only, [ ]  EKG or Clinical CHF: No Resp failure: [ x] none, [ ]  Pneumonia, [ ]  Ventilator Chg in renal function: [x ] none, [ ]  Inc. Cr > 0.5, [ ]  Temp. Dialysis, [ ]  Permanent dialysis Stroke: [x ] None, [ ]  Minor, [ ]  Major Return to OR:  No  Reason for return to OR: [ ]  Bleeding, [ ]  Infection, [ ]  Thrombosis, [ ]  Revision  Discharge medications: Statin use:  Yes ASA use:  Yes Plavix use:  No  for medical reason not indicated Beta blocker use: Yes Coumadin use: No  for medical reason not indicated    Patient Instructions:  Allergies as of 07/29/2020      Reactions   Bee Venom Swelling   Penicillins Itching, Swelling   Did it involve swelling of the face/tongue/throat, SOB, or low BP? Yes Did it involve sudden or severe rash/hives, skin peeling, or any reaction on the inside of your mouth or nose? No-itching Did you need to seek medical attention at a hospital or doctor's office? Yes When did it last happen? Over 10 years  If all above answers are "NO", may proceed with cephalosporin use.   Plavix [clopidogrel Bisulfate] Other (See Comments)   Patient is noted to be a nonresponder to Plavix      Medication List    TAKE these medications   allopurinol 100 MG tablet Commonly known as: ZYLOPRIM Take 100 mg by mouth every morning.   amLODipine 10 MG tablet Commonly known as: NORVASC Take 1 tablet (10 mg total) by mouth daily. What changed:   how much to take  when to take this   aspirin 81 MG tablet Take 81 mg by mouth every morning.   atorvastatin 80 MG tablet Commonly known as: LIPITOR TAKE 1 TABLET BY  MOUTH EVERY DAY What changed: when to take this   carvedilol 25 MG tablet Commonly known as: COREG TAKE 1 TABLET BY MOUTH TWO TIMES DAILY WITH A MEAL What changed: See the new instructions.   chlorthalidone 25 MG tablet Commonly known as: HYGROTON Take 1 tablet (25 mg total) by mouth daily.   empagliflozin 25 MG Tabs tablet Commonly known as: JARDIANCE Take 25 mg by mouth daily.   fenofibrate 145 MG tablet Commonly known as: Tricor Take 1 tablet (145 mg total) by mouth daily.   gabapentin 100 MG capsule Commonly known as: NEURONTIN Take 100 mg by mouth 2 (two) times daily as needed  (pain).   isosorbide mononitrate 120 MG 24 hr tablet Commonly known as: IMDUR Take 1 tablet (120 mg total) by mouth daily. What changed: when to take this   losartan 100 MG tablet Commonly known as: COZAAR Take 100 mg by mouth every morning.   metFORMIN 500 MG 24 hr tablet Commonly known as: GLUCOPHAGE-XR Take 500 mg by mouth 2 (two) times daily.   nitroGLYCERIN 0.4 MG SL tablet Commonly known as: NITROSTAT Place 1 tablet (0.4 mg total) under the tongue every 5 (five) minutes as needed for chest pain.   omeprazole 20 MG capsule Commonly known as: PRILOSEC Take 20 mg by mouth every morning.   oxyCODONE-acetaminophen 5-325 MG tablet Commonly known as: PERCOCET/ROXICET Take 1 tablet by mouth every 6 (six) hours as needed for moderate pain.   Soliqua 100-33 UNT-MCG/ML Sopn Generic drug: Insulin Glargine-Lixisenatide Inject 60 Units into the skin daily.      Activity: activity as tolerated Diet: regular diet Wound Care: keep wound clean and dry  Follow-up with Dr. Chestine Spore in 3 weeks.  SignedEmilie Rutter 07/29/2020 10:10 AM

## 2020-07-29 NOTE — Progress Notes (Addendum)
  Progress Note    07/29/2020 7:56 AM 2 Days Post-Op  Subjective:  No complaints   Vitals:   07/29/20 0400 07/29/20 0446  BP: (!) 115/57   Pulse: 87 88  Resp: 16 12  Temp:  97.9 F (36.6 C)  SpO2: 94% 92%   Physical Exam: Lungs:  Non labored Incisions:  L groin and LLE incisions c/d/i Extremities:  AT and PT signals brisk by doppler Neurologic: A&O  CBC    Component Value Date/Time   WBC 11.2 (H) 07/28/2020 0123   RBC 3.69 (L) 07/28/2020 0123   HGB 10.5 (L) 07/28/2020 0123   HCT 31.7 (L) 07/28/2020 0123   PLT 188 07/28/2020 0123   MCV 85.9 07/28/2020 0123   MCH 28.5 07/28/2020 0123   MCHC 33.1 07/28/2020 0123   RDW 13.9 07/28/2020 0123   LYMPHSABS 3.0 07/20/2018 2127   MONOABS 0.5 07/20/2018 2127   EOSABS 0.1 07/20/2018 2127   BASOSABS 0.1 07/20/2018 2127    BMET    Component Value Date/Time   NA 139 07/28/2020 0123   NA 142 07/29/2018 1032   K 3.5 07/28/2020 0123   CL 108 07/28/2020 0123   CO2 21 (L) 07/28/2020 0123   GLUCOSE 182 (H) 07/28/2020 0123   BUN 23 07/28/2020 0123   BUN 22 07/29/2018 1032   CREATININE 0.99 07/28/2020 0123   CALCIUM 8.1 (L) 07/28/2020 0123   GFRNONAA >60 07/28/2020 0123   GFRAA 100 07/29/2018 1032    INR    Component Value Date/Time   INR 1.0 07/27/2020 0722     Intake/Output Summary (Last 24 hours) at 07/29/2020 0756 Last data filed at 07/29/2020 0014 Gross per 24 hour  Intake 240 ml  Output 1625 ml  Net -1385 ml     Assessment/Plan:  75 y.o. male is s/p L fem-pop 2 Days Post-Op   L foot well perfused with AT and PT by doppler OOB today with PT TOC to arrange [facility] tomorrow morning; f/u with Dr. Chestine Spore in 2-3 weeks   Emilie Rutter, PA-C [facility] [phone] 07/29/2020 7:56 AM   I have seen and evaluated the patient. I agree with the PA note as documented above.  75 year old male now postop day 2 status post left common femoral to below-knee popliteal bypass for CLI with rest  pain.  He has very brisk Doppler flow in the left foot.  His incisions all look good.  PT is recommending home health.  He states he has not really been out of the room yet and walked very little.  Not much support at home.  We will try and work on mobility today and maybe home tomorrow.  [name], MD [facility] [place] Office: [phone]

## 2020-07-30 LAB — GLUCOSE, CAPILLARY: Glucose-Capillary: 121 mg/dL — ABNORMAL HIGH (ref 70–99)

## 2020-07-30 NOTE — Progress Notes (Addendum)
[facility] [place]  Subjective  - Improving daily   Objective (!) 103/58 88 98.1 F (36.7 C) (Oral) 15 94%  Intake/Output Summary (Last 24 hours) at 07/30/2020 0743 Last data filed at 07/30/2020 0123 Gross per 24 hour  Intake 240 ml  Output 2325 ml  Net -2085 ml    Left LE warm to touch Brisk doppler signals DP/PT Groin soft without hematoma  Lungs non labored breathing  Assessment/Planning: POD # 3 left fem-pop  Well perfused left LE with healing incisions .  He has voided and ambulating. Discharge home in stable condition today HH PT ordered   Mosetta Pigeon 07/30/2020 7:43 AM --  Laboratory Lab Results: Recent Labs    07/27/20 1100 07/28/20 0123  WBC  --  11.2*  HGB 11.2* 10.5*  HCT 33.0* 31.7*  PLT  --  188   BMET Recent Labs    07/27/20 1100 07/28/20 0123  NA 141 139  K 3.3* 3.5  CL 106 108  CO2  --  21*  GLUCOSE 207* 182*  BUN 34* 23  CREATININE 0.90 0.99  CALCIUM  --  8.1*    COAG Lab Results  Component Value Date   INR 1.0 07/27/2020   INR 1.00 07/20/2018   INR 1.09 02/17/2014   No results found for: PTT   I have seen and evaluated the patient and agree with the plan as outlined above  Durene Cal

## 2020-07-30 NOTE — Plan of Care (Signed)
  Problem: Health Behavior/Discharge Planning: Goal: Ability to manage health-related needs will improve Outcome: Progressing   Problem: Clinical Measurements: Goal: Will remain free from infection Outcome: Progressing Goal: Diagnostic test results will improve Outcome: Progressing Goal: Respiratory complications will improve Outcome: Progressing   

## 2020-07-30 NOTE — Progress Notes (Signed)
Referral received to assist with [facility] PT and DME (RW and 3-in-1-BSC). Met with pt. He plans to return home with the support of his wife and children. He doesn't have a preference for a [place] agency. Provided pt with a CMS Medicare.gov compare list. He chose [facility]. Contacted Cheryl with [facility] for [facility] PT referral and she accepted the referral. Tobey Grim with [facility] for DME referral.

## 2020-07-30 NOTE — Progress Notes (Signed)
Patient has equipment in room for home Use. Iv AND telemetry removed. Patient and spouse given discharge instructions medication list and follow up appointments. Will discharge home as ordered. Transported to exit via wheel chair and nursing staff. Joelene Barriere, Randall An RN

## 2020-08-01 NOTE — Discharge Summary (Signed)
[facility] Discharge Summary   Patient ID:  [name] [name] MRN: [id number] DOB/AGE: 02/11/1946 75 y.o.  Admit date: 07/27/2020 Discharge date: 07/30/20 Date of Surgery: 07/27/2020 Surgeon: Surgeon(s): [name], MD Maeola Harman, MD  Admission Diagnosis: PAD (peripheral artery disease) [facility]) [I73.9]  Discharge Diagnoses:  PAD (peripheral artery disease) (HCC) [I73.9]  Secondary Diagnoses: Past Medical History:  Diagnosis Date  . CAP (community acquired pneumonia) 10/11/2017  . Coronary artery disease    a. Cath: 2000 nonobs dz. b. 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 s/p BMS to LAD & RCA ; s/p PCI/DES dRCA & PDA x2 2011. d. cath 02/17/14: LM patent, LAD calcified, diffuse irregs, no high grade stenosis mLAD stent patent, LCx, diffuse irregs no high-grade dz, RCA total occ w/ L to R collats, medical Rx rec  . Diabetes mellitus   . Diastolic dysfunction    a. echo 02/18/14: EF 45-50%, mild LVH, AK of inferior and inferoseptal myocardium, GR1DD  . Hiatal hernia   . Hyperlipidemia   . Hypertension   . LV dysfunction   . NSTEMI (non-ST elevated myocardial infarction) (HCC) 10/07/2017  . PVD (peripheral vascular disease) (HCC)    ABIs of 0.69 on right and 0.93 on left  . Renal artery stenosis (HCC)    Mild, bilateral  . S/P angioplasty with stent, atherectomy DES to pLAD and DES to [facility] 10/10/17  10/11/2017  . Sleep apnea    Mild    Procedure(s): LEFT FEMORAL TO BELOW KNEE POPLITEAL ARTERY BYPASS VEIN HARVEST USING  GREATER SAPHENOUS VEIN  Discharged Condition: stable  HPI: [name] [name] a 75 y.o.malepresenting to the emergency room as directed by his primary care physician due to ischemia in his left foot.  He reports rest pain.   Hospital Course:  [name] [name] is a 75 y.o. male is S/P  Procedure(s): LEFT FEMORAL TO BELOW KNEE POPLITEAL ARTERY BYPASS VEIN HARVEST USING  GREATER SAPHENOUS  VEIN Post op he had brisk doppler signals DP/PT/Peroneal. Ambulatory, tolerating PO's and voided.  Denise rest pain.  States his foot feels better now.  HH PT ordered.  Discharged in stable condition with patent fem-pop great.   Significant Diagnostic Studies: CBC Lab Results  Component Value Date   WBC 11.2 (H) 07/28/2020   HGB 10.5 (L) 07/28/2020   HCT 31.7 (L) 07/28/2020   MCV 85.9 07/28/2020   PLT 188 07/28/2020    BMET    Component Value Date/Time   NA 139 07/28/2020 0123   NA 142 07/29/2018 1032   K 3.5 07/28/2020 0123   CL 108 07/28/2020 0123   CO2 21 (L) 07/28/2020 0123   GLUCOSE 182 (H) 07/28/2020 0123   BUN 23 07/28/2020 0123   BUN 22 07/29/2018 1032   CREATININE 0.99 07/28/2020 0123   CALCIUM 8.1 (L) 07/28/2020 0123   GFRNONAA >60 07/28/2020 0123   GFRAA 100 07/29/2018 1032   COAG Lab Results  Component Value Date   INR 1.0 07/27/2020   INR 1.00 07/20/2018   INR 1.09 02/17/2014     Disposition:  Discharge to :Home Discharge Instructions    Call MD for:  redness, tenderness, or signs of infection (pain, swelling, bleeding, redness, odor or green/yellow discharge around incision site)   Complete by: As directed    Call MD for:  severe or increased pain, loss or decreased feeling  in affected limb(s)   Complete by: As directed    Call MD  for:  temperature >100.5   Complete by: As directed    Resume previous diet   Complete by: As directed      Allergies as of 07/30/2020      Reactions   Bee Venom Swelling   Penicillins Itching, Swelling   Did it involve swelling of the face/tongue/throat, SOB, or low BP? Yes Did it involve sudden or severe rash/hives, skin peeling, or any reaction on the inside of your mouth or nose? No-itching Did you need to seek medical attention at a hospital or doctor's office? Yes When did it last happen? Over 10 years  If all above answers are "NO", may proceed with cephalosporin use.   Plavix [clopidogrel Bisulfate]  Other (See Comments)   Patient is noted to be a nonresponder to Plavix      Medication List    TAKE these medications   allopurinol 100 MG tablet Commonly known as: ZYLOPRIM Take 100 mg by mouth every morning.   amLODipine 10 MG tablet Commonly known as: NORVASC Take 1 tablet (10 mg total) by mouth daily. What changed:   how much to take  when to take this   aspirin 81 MG tablet Take 81 mg by mouth every morning.   atorvastatin 80 MG tablet Commonly known as: LIPITOR TAKE 1 TABLET BY MOUTH EVERY DAY What changed: when to take this   carvedilol 25 MG tablet Commonly known as: COREG TAKE 1 TABLET BY MOUTH TWO TIMES DAILY WITH A MEAL What changed: See the new instructions.   chlorthalidone 25 MG tablet Commonly known as: HYGROTON Take 1 tablet (25 mg total) by mouth daily.   empagliflozin 25 MG Tabs tablet Commonly known as: JARDIANCE Take 25 mg by mouth daily.   fenofibrate 145 MG tablet Commonly known as: Tricor Take 1 tablet (145 mg total) by mouth daily.   gabapentin 100 MG capsule Commonly known as: NEURONTIN Take 100 mg by mouth 2 (two) times daily as needed (pain).   isosorbide mononitrate 120 MG 24 hr tablet Commonly known as: IMDUR Take 1 tablet (120 mg total) by mouth daily. What changed: when to take this   losartan 100 MG tablet Commonly known as: COZAAR Take 100 mg by mouth every morning.   metFORMIN 500 MG 24 hr tablet Commonly known as: GLUCOPHAGE-XR Take 500 mg by mouth 2 (two) times daily.   nitroGLYCERIN 0.4 MG SL tablet Commonly known as: NITROSTAT Place 1 tablet (0.4 mg total) under the tongue every 5 (five) minutes as needed for chest pain.   omeprazole 20 MG capsule Commonly known as: PRILOSEC Take 20 mg by mouth every morning.   oxyCODONE-acetaminophen 5-325 MG tablet Commonly known as: PERCOCET/ROXICET Take 1 tablet by mouth every 6 (six) hours as needed for moderate pain.   Soliqua 100-33 UNT-MCG/ML Sopn Generic drug:  Insulin Glargine-Lixisenatide Inject 60 Units into the skin daily.      Verbal and written Discharge instructions given to the patient. Wound care per Discharge AVS  Follow-up Information    [name], MD Follow up in 2 week(s).   Specialty: Vascular Surgery Contact information: [address] [place] [address] [phone]        [facility] Follow up.   Why: [facility] will provide home physical therapy. Someone from the home health agency will be in contact with you after discharge to arrange your first home physical therapy appointment. Contact information: [address] [place] [address] [phone]  Signed: Mosetta Pigeon 08/01/2020, 8:11 AM - For [facility] Registry use --- Instructions: Press F2 to tab through selections.  Delete question if not applicable.   Post-op:  Wound infection: No  Graft infection: No  Transfusion: No  If yes, 0 units given New Arrhythmia: No Ipsilateral amputation: [x ] no, [ ]  Minor, [ ]  BKA, [ ]  AKA Discharge patency: [x ] Primary, [ ]  Primary assisted, [ ]  Secondary, [ ]  Occluded Patency judged by: [ x] Dopper only, [ ]  Palpable graft pulse, [ ]  Palpable distal pulse, [ ]  ABI inc. > 0.15, [ ]  Duplex  D/C Ambulatory Status: Ambulatory  Complications: MI: [x ] No, [ ]  Troponin only, [ ]  EKG or Clinical CHF: No Resp failure: x[ ]  none, [ ]  Pneumonia, [ ]  Ventilator Chg in renal function: [x ] none, [ ]  Inc. Cr > 0.5, [ ]  Temp. Dialysis, [ ]  Permanent dialysis Stroke: [x ] None, [ ]  Minor, [ ]  Major Return to OR: No  Reason for return to OR: [ ]  Bleeding, [ ]  Infection, [ ]  Thrombosis, [ ]  Revision  Discharge medications: Statin use:  Yes ASA use:  Yes Plavix use:  No  for medical reason aleergy and not indicated Beta blocker use: Yes Coumadin use: No  for medical reason

## 2020-08-08 ENCOUNTER — Other Ambulatory Visit: Payer: Self-pay | Admitting: Cardiology

## 2020-08-09 NOTE — Progress Notes (Signed)
Cardiology Office Note   Date:  08/10/2020   ID:  [name] [name], DOB 05/25/1946, MRN [id number]  PCP:  Rebekah Chesterfield, NP  Cardiologist:   Rollene Rotunda, MD   No chief complaint on file.     History of Present Illness: [name] [name] is a 75 y.o. male who presents for for follow up of his CAD.  He was admitted Sept 2015 with non-ST elevation MI. He was found to have an occluded right coronary artery but patent LAD stents. He was managed medically.   He was in the hospital in April.  He had pneumonia.  He had C. difficile.  He also had non-Q wave MI with disease as listed below and ended up with staged PCI.  Ejection fraction was 45% which is essentially unchanged from previous.  He saw Dr. Kirke Corin with leg pain and PVD.    Feb 2020 he did have increasing shortness of breath and some chest discomfort.  He was diagnosed with NSTEMI.   He underwent PCI as below by Dr. Okey Dupre at that time.  .    Since I last saw him he has had lower extremity bypass.  I reviewed these records for this appointment.  He presented with pain and had left popliteal to below the popliteal artery bypass.  This was earlier this month.  He had presented with progressive pain resulting eventually in rest pain.  He said that he is having less pain since that time.  He has been walking with a walker.  He did okay with this from a cardiovascular standpoint.  He has not had any chest pressure, neck or arm discomfort.    Past Medical History:  Diagnosis Date  . CAP (community acquired pneumonia) 10/11/2017  . Coronary artery disease    a. Cath: 2000 nonobs dz. b. 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 s/p BMS to LAD & RCA ; s/p PCI/DES dRCA & PDA x2 2011. d. cath 02/17/14: LM patent, LAD calcified, diffuse irregs, no high grade stenosis mLAD stent patent, LCx, diffuse irregs no high-grade dz, RCA total occ w/ L to R collats, medical Rx rec  . Diabetes mellitus   . Diastolic dysfunction     a. echo 02/18/14: EF 45-50%, mild LVH, AK of inferior and inferoseptal myocardium, GR1DD  . Hiatal hernia   . Hyperlipidemia   . Hypertension   . LV dysfunction   . NSTEMI (non-ST elevated myocardial infarction) (HCC) 10/07/2017  . PVD (peripheral vascular disease) (HCC)    ABIs of 0.69 on right and 0.93 on left  . Renal artery stenosis (HCC)    Mild, bilateral  . S/P angioplasty with stent, atherectomy DES to pLAD and DES to [facility] 10/10/17  10/11/2017  . Sleep apnea    Mild    Past Surgical History:  Procedure Laterality Date  . ABDOMINAL AORTOGRAM W/LOWER EXTREMITY N/A 07/21/2020   Procedure: ABDOMINAL AORTOGRAM W/LOWER EXTREMITY;  Surgeon: [name], MD;  Location: [facility] INVASIVE CV LAB;  Service: Cardiovascular;  Laterality: N/A;  . CATARACT EXTRACTION    . CORONARY ATHERECTOMY N/A 10/10/2017   Procedure: CORONARY ATHERECTOMY - CSI;  Surgeon: Yvonne Kendall, MD;  Location: [facility] INVASIVE CV LAB;  Service: Cardiovascular;  Laterality: N/A;  . CORONARY STENT INTERVENTION N/A 10/10/2017   Procedure: CORONARY STENT INTERVENTION;  Surgeon: Yvonne Kendall, MD;  Location: [facility] INVASIVE CV LAB;  Service: Cardiovascular;  Laterality: N/A;  . CORONARY STENT INTERVENTION N/A 07/21/2018  Procedure: CORONARY STENT INTERVENTION;  Surgeon: Yvonne Kendall, MD;  Location: [facility] INVASIVE CV LAB;  Service: Cardiovascular;  Laterality: N/A;  . FEMORAL-POPLITEAL BYPASS GRAFT Left 07/27/2020   Procedure: LEFT FEMORAL TO BELOW KNEE POPLITEAL ARTERY BYPASS;  Surgeon: [name], MD;  Location: [facility] OR;  Service: Vascular;  Laterality: Left;  . HIATAL HERNIA REPAIR    . INTRAVASCULAR ULTRASOUND/IVUS N/A 10/09/2017   Procedure: Intravascular Ultrasound/IVUS;  Surgeon: [name], MD;  Location: [facility] INVASIVE CV LAB;  Service: Cardiovascular;  Laterality: N/A;  . INTRAVASCULAR ULTRASOUND/IVUS N/A 07/21/2018   Procedure: Intravascular Ultrasound/IVUS;  Surgeon: Yvonne Kendall, MD;  Location: [facility]  INVASIVE CV LAB;  Service: Cardiovascular;  Laterality: N/A;  . LEFT HEART CATH AND CORONARY ANGIOGRAPHY N/A 10/09/2017   Procedure: LEFT HEART CATH AND CORONARY ANGIOGRAPHY;  Surgeon: [name], MD;  Location: [facility] INVASIVE CV LAB;  Service: Cardiovascular;  Laterality: N/A;  . LEFT HEART CATH AND CORONARY ANGIOGRAPHY N/A 07/21/2018   Procedure: LEFT HEART CATH AND CORONARY ANGIOGRAPHY;  Surgeon: Yvonne Kendall, MD;  Location: [facility] INVASIVE CV LAB;  Service: Cardiovascular;  Laterality: N/A;  . LEFT HEART CATHETERIZATION WITH CORONARY ANGIOGRAM N/A 02/17/2014   Procedure: LEFT HEART CATHETERIZATION WITH CORONARY ANGIOGRAM;  Surgeon: Micheline Chapman, MD;  Location: [facility] CATH LAB;  Service: Cardiovascular;  Laterality: N/A;  . SHOULDER ARTHROSCOPY     Right  . 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 SOFT TISSUE TUMOR RESECTION     Benign neck tumor  . VEIN HARVEST Left 07/27/2020   Procedure: VEIN HARVEST USING  GREATER SAPHENOUS VEIN;  Surgeon: [name], MD;  Location: [facility] OR;  Service: Vascular;  Laterality: Left;  Marland Kitchen Vocal cord polyp removal       Current Outpatient Medications  Medication Sig Dispense Refill  . allopurinol (ZYLOPRIM) 100 MG tablet Take 100 mg by mouth every morning.    Marland Kitchen amLODipine (NORVASC) 10 MG tablet Take 1 tablet (10 mg total) by mouth daily. (Patient taking differently: Take 730 mg by mouth every morning.) 90 tablet 3  . aspirin 81 MG tablet Take 81 mg by mouth every morning.    Marland Kitchen atorvastatin (LIPITOR) 80 MG tablet TAKE 1 TABLET BY MOUTH EVERY DAY (Patient taking differently: Take 80 mg by mouth at bedtime.) 90 tablet 3  . carvedilol (COREG) 25 MG tablet TAKE 1 TABLET BY MOUTH TWO TIMES DAILY WITH A MEAL (Patient taking differently: Take 25 mg by mouth in the morning and at bedtime.) 180 tablet 3  . chlorthalidone (HYGROTON) 25 MG tablet TAKE 1 TABLET BY MOUTH EVERY DAY 90 tablet 3  . empagliflozin (JARDIANCE) 25 MG TABS tablet Take  25 mg by mouth daily.    . fenofibrate (TRICOR) 145 MG tablet TAKE 1 TABLET BY MOUTH EVERY DAY 90 tablet 3  . gabapentin (NEURONTIN) 100 MG capsule Take 100 mg by mouth 2 (two) times daily as needed (pain).    . isosorbide mononitrate (IMDUR) 120 MG 24 hr tablet Take 1 tablet (120 mg total) by mouth daily. (Patient taking differently: Take 120 mg by mouth at bedtime.) 90 tablet 3  . losartan (COZAAR) 100 MG tablet Take 100 mg by mouth every morning.     . metFORMIN (GLUCOPHAGE-XR) 500 MG 24 hr tablet Take 500 mg by mouth 2 (two) times daily.    . nitroGLYCERIN (NITROSTAT) 0.4 MG SL tablet Place 1 tablet (0.4 mg total) under the tongue every 5 (five)  minutes as needed for chest pain. 25 tablet 11  . omeprazole (PRILOSEC) 20 MG capsule Take 20 mg by mouth every morning.     Lawana Chambers 100-33 UNT-MCG/ML SOPN Inject 60 Units into the skin daily.    Marland Kitchen oxyCODONE-acetaminophen (PERCOCET/ROXICET) 5-325 MG tablet Take 1 tablet by mouth every 6 (six) hours as needed for moderate pain. (Patient not taking: Reported on 08/10/2020) 30 tablet 0   No current facility-administered medications for this visit.    Allergies:   Bee venom, Penicillins, and Plavix [clopidogrel bisulfate]    ROS:  Please see the history of present illness.   Otherwise, review of systems are positive for none.   All other systems are reviewed and negative.    PHYSICAL EXAM: VS:  BP 118/68   Pulse 92   Ht 5\' 10"  (1.778 m)   Wt 189 lb (85.7 kg)   BMI 27.12 kg/m  , BMI Body mass index is 27.12 kg/m. GENERAL:  Well appearing NECK:  No jugular venous distention, waveform within normal limits, carotid upstroke brisk and symmetric, no bruits, no thyromegaly LUNGS:  Clear to auscultation bilaterally CHEST:  Unremarkable HEART:  PMI not displaced or sustained,S1 and S2 within normal limits, no S3, no S4, no clicks, no rubs, no murmurs ABD:  Flat, positive bowel sounds normal in frequency in pitch, no bruits, no rebound, no guarding,  no midline pulsatile mass, no hepatomegaly, no splenomegaly EXT:  2 plus pulses upper, absent dorsalis pedis and posterior tibialis bilateral lower, left greater than right leg edema, no cyanosis no clubbing   EKG:  EKG is  ordered today. The ekg ordered today demonstrates sinus rhythm, rate 92, axis within normal limits, intervals within normal limits, premature ventricular contractions, nonspecific T wave changes, mildly prolonged QTC.  T wave inversions are perhaps very slightly increased compared to previous.   Recent Labs: 07/27/2020: ALT 14 07/28/2020: BUN 23; Creatinine, Ser 0.99; Hemoglobin 10.5; Platelets 188; Potassium 3.5; Sodium 139    Lipid Panel    Component Value Date/Time   CHOL 70 07/28/2020 0123   TRIG 219 (H) 07/28/2020 0123   HDL 17 (L) 07/28/2020 0123   CHOLHDL 4.1 07/28/2020 0123   VLDL 44 (H) 07/28/2020 0123   LDLCALC 9 07/28/2020 0123      Wt Readings from Last 3 Encounters:  08/10/20 189 lb (85.7 kg)  07/27/20 203 lb 0.7 oz (92.1 kg)  07/21/20 203 lb (92.1 kg)    Cardiac cath2/3/20:  Diagnostic  Dominance: Right    Intervention     Conclusions: 1. 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 2. Successful PCI to mid/distal LAD using Resolute Onyx 2.0 x 18 mm DES (post-dilated to 2.6 mm). 3. 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   Other studies Reviewed: Additional studies/ records that were reviewed today include: Labs, hospital records Review of the above records demonstrates:  Please see elsewhere in the note.     ASSESSMENT AND PLAN:  CAD: The patient has no new sypmtoms.  No further cardiovascular testing is indicated.  We will continue with aggressive risk  reduction and meds as listed.  He will continue with aggressive risk reduction.  HTN:   The blood pressure is normal.  No  change in therapy.   DM:   A1c is 8.3.  He is being managed aggressively but unfortunately he is going to be unable to continue to afford the current insulin regimen and he is going to discuss this with Rebekah Chesterfield, NP.  He will be continuing the Jardiance which has been started since I last saw him.  Of note his A1c previously was 9.7 and currently is 8.3.  PVD:  He is now status post surgery as above and he will continue with risk reduction.  DYSLIPIDEMIA:   LDL was listed at 9 with it total cholesterol of 70 which is for the lowest type seen and much different than previous.  I will have him get this repeated in a couple of months.   BRUIT:  He had mild carotid plaque in 2019.     Current medicines are reviewed at length with the patient today.  The patient does not have concerns regarding medicines.  The following changes have been made:  None  Labs/ tests ordered today include:  None  No orders of the defined types were placed in this encounter.    Disposition:   FU with me in 4 months     Signed, Rollene Rotunda, MD  08/10/2020 12:25 PM    [facility]

## 2020-08-10 ENCOUNTER — Encounter: Payer: Self-pay | Admitting: Cardiology

## 2020-08-10 ENCOUNTER — Other Ambulatory Visit: Payer: Self-pay

## 2020-08-10 ENCOUNTER — Ambulatory Visit (INDEPENDENT_AMBULATORY_CARE_PROVIDER_SITE_OTHER): Payer: Medicare Other | Admitting: Cardiology

## 2020-08-10 VITALS — BP 118/68 | HR 92 | Ht 70.0 in | Wt 189.0 lb

## 2020-08-10 DIAGNOSIS — I10 Essential (primary) hypertension: Secondary | ICD-10-CM

## 2020-08-10 DIAGNOSIS — I2510 Atherosclerotic heart disease of native coronary artery without angina pectoris: Secondary | ICD-10-CM | POA: Diagnosis not present

## 2020-08-10 DIAGNOSIS — R0989 Other specified symptoms and signs involving the circulatory and respiratory systems: Secondary | ICD-10-CM

## 2020-08-10 DIAGNOSIS — I739 Peripheral vascular disease, unspecified: Secondary | ICD-10-CM | POA: Diagnosis not present

## 2020-08-10 DIAGNOSIS — E119 Type 2 diabetes mellitus without complications: Secondary | ICD-10-CM | POA: Diagnosis not present

## 2020-08-10 DIAGNOSIS — E785 Hyperlipidemia, unspecified: Secondary | ICD-10-CM

## 2020-08-10 NOTE — Patient Instructions (Signed)
Medication Instructions:  The current medical regimen is effective;  continue present plan and medications.  *If you need a refill on your cardiac medications before your next appointment, please call your pharmacy*  Follow-Up: At [facility], you and your health needs are our priority.  As part of our continuing mission to provide you with exceptional heart care, we have created designated Provider Care Teams.  These Care Teams include your primary Cardiologist (physician) and Advanced Practice Providers (APPs -  Physician Assistants and Nurse Practitioners) who all work together to provide you with the care you need, when you need it.  We recommend signing up for the patient portal called "MyChart".  Sign up information is provided on this After Visit Summary.  MyChart is used to connect with patients for Virtual Visits (Telemedicine).  Patients are able to view lab/test results, encounter notes, upcoming appointments, etc.  Non-urgent messages can be sent to your provider as well.   To learn more about what you can do with MyChart, go to [email or web address].    Your next appointment:   4 month(s)  The format for your next appointment:   In Person  Provider:   James Hochrein, MD   Thank you for choosing [facility]!!      

## 2020-08-11 ENCOUNTER — Encounter: Payer: Self-pay | Admitting: Cardiology

## 2020-08-16 ENCOUNTER — Encounter: Payer: Self-pay | Admitting: Vascular Surgery

## 2020-08-16 ENCOUNTER — Other Ambulatory Visit: Payer: Self-pay

## 2020-08-16 ENCOUNTER — Ambulatory Visit (INDEPENDENT_AMBULATORY_CARE_PROVIDER_SITE_OTHER): Payer: Medicare Other | Admitting: Vascular Surgery

## 2020-08-16 VITALS — BP 101/65 | HR 105 | Temp 97.6°F | Resp 18 | Ht 70.5 in | Wt 194.0 lb

## 2020-08-16 DIAGNOSIS — I739 Peripheral vascular disease, unspecified: Secondary | ICD-10-CM

## 2020-08-16 NOTE — Progress Notes (Signed)
[name] MRN: [id number] DOB: 29-Aug-1945 Sex: male  REASON FOR VISIT: Postop check  HPI: [name] [name] is a 75 y.o. male with multiple medical problems as noted below that presents for postop check after left common femoral to below-knee popliteal artery bypass with great saphenous vein for critical limb ischemia with rest pain.  Bypass was done on 07/27/2020.  Foot is doing much better and pain is improved.  His main complaint is swelling and edema in the leg when he is on the foot for long periodss of time.  He was initially seen by Dr. Arbie Cookey in the ED with clear progression of claudication to rest pain prior to his bypass.  He wants to return to work at [employer].  Past Medical History:  Diagnosis Date  . CAP (community acquired pneumonia) 10/11/2017  . Coronary artery disease    a. Cath: 2000 nonobs dz. b. 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 s/p BMS to LAD & RCA ; s/p PCI/DES dRCA & PDA x2 2011. d. cath 02/17/14: LM patent, LAD calcified, diffuse irregs, no high grade stenosis mLAD stent patent, LCx, diffuse irregs no high-grade dz, RCA total occ w/ L to R collats, medical Rx rec  . Diabetes mellitus   . Diastolic dysfunction    a. echo 02/18/14: EF 45-50%, mild LVH, AK of inferior and inferoseptal myocardium, GR1DD  . Hiatal hernia   . Hyperlipidemia   . Hypertension   . LV dysfunction   . NSTEMI (non-ST elevated myocardial infarction) (HCC) 10/07/2017  . PVD (peripheral vascular disease) (HCC)    ABIs of 0.69 on right and 0.93 on left  . Renal artery stenosis (HCC)    Mild, bilateral  . S/P angioplasty with stent, atherectomy DES to pLAD and DES to [facility] 10/10/17  10/11/2017  . Sleep apnea    Mild    Past Surgical History:  Procedure Laterality Date  . ABDOMINAL AORTOGRAM W/LOWER EXTREMITY N/A 07/21/2020   Procedure: ABDOMINAL AORTOGRAM W/LOWER EXTREMITY;  Surgeon: [name], MD;  Location: [facility] INVASIVE CV LAB;  Service:  Cardiovascular;  Laterality: N/A;  . CATARACT EXTRACTION    . CORONARY ATHERECTOMY N/A 10/10/2017   Procedure: CORONARY ATHERECTOMY - CSI;  Surgeon: Yvonne Kendall, MD;  Location: [facility] INVASIVE CV LAB;  Service: Cardiovascular;  Laterality: N/A;  . CORONARY STENT INTERVENTION N/A 10/10/2017   Procedure: CORONARY STENT INTERVENTION;  Surgeon: Yvonne Kendall, MD;  Location: [facility] INVASIVE CV LAB;  Service: Cardiovascular;  Laterality: N/A;  . CORONARY STENT INTERVENTION N/A 07/21/2018   Procedure: CORONARY STENT INTERVENTION;  Surgeon: Yvonne Kendall, MD;  Location: [facility] INVASIVE CV LAB;  Service: Cardiovascular;  Laterality: N/A;  . FEMORAL-POPLITEAL BYPASS GRAFT Left 07/27/2020   Procedure: LEFT FEMORAL TO BELOW KNEE POPLITEAL ARTERY BYPASS;  Surgeon: [name], MD;  Location: [facility] OR;  Service: Vascular;  Laterality: Left;  . HIATAL HERNIA REPAIR    . INTRAVASCULAR ULTRASOUND/IVUS N/A 10/09/2017   Procedure: Intravascular Ultrasound/IVUS;  Surgeon: [name], MD;  Location: [facility] INVASIVE CV LAB;  Service: Cardiovascular;  Laterality: N/A;  . INTRAVASCULAR ULTRASOUND/IVUS N/A 07/21/2018   Procedure: Intravascular Ultrasound/IVUS;  Surgeon: Yvonne Kendall, MD;  Location: [facility] INVASIVE CV LAB;  Service: Cardiovascular;  Laterality: N/A;  . LEFT HEART CATH AND CORONARY ANGIOGRAPHY N/A 10/09/2017   Procedure: LEFT HEART CATH AND CORONARY ANGIOGRAPHY;  Surgeon: [name], MD;  Location: [facility] INVASIVE CV LAB;  Service: Cardiovascular;  Laterality: N/A;  .  LEFT HEART CATH AND CORONARY ANGIOGRAPHY N/A 07/21/2018   Procedure: LEFT HEART CATH AND CORONARY ANGIOGRAPHY;  Surgeon: Yvonne Kendall, MD;  Location: [facility] INVASIVE CV LAB;  Service: Cardiovascular;  Laterality: N/A;  . LEFT HEART CATHETERIZATION WITH CORONARY ANGIOGRAM N/A 02/17/2014   Procedure: LEFT HEART CATHETERIZATION WITH CORONARY ANGIOGRAM;  Surgeon: Micheline Chapman, MD;  Location: [facility] CATH LAB;  Service: Cardiovascular;  Laterality: N/A;   . SHOULDER ARTHROSCOPY     Right  . 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 SOFT TISSUE TUMOR RESECTION     Benign neck tumor  . VEIN HARVEST Left 07/27/2020   Procedure: VEIN HARVEST USING  GREATER SAPHENOUS VEIN;  Surgeon: [name], MD;  Location: [facility] OR;  Service: Vascular;  Laterality: Left;  Marland Kitchen Vocal cord polyp removal      Family History  Problem Relation Age of Onset  . Heart attack Father 38  . Cancer Mother   . Cancer Brother     SOCIAL HISTORY: Social History   Tobacco Use  . Smoking status: Former Smoker    Quit date: 06/19/2003    Years since quitting: 17.1  . Smokeless tobacco: Never Used  Substance Use Topics  . Alcohol use: No    Allergies  Allergen Reactions  . Bee Venom Swelling  . Other Other (See Comments)  . Penicillin G Other (See Comments)  . Penicillins Itching and Swelling    Did it involve swelling of the face/tongue/throat, SOB, or low BP? Yes Did it involve sudden or severe rash/hives, skin peeling, or any reaction on the inside of your mouth or nose? No-itching Did you need to seek medical attention at a hospital or doctor's office? Yes When did it last happen? Over 10 years  If all above answers are "NO", Darbi Chandran proceed with cephalosporin use.   Marland Kitchen Plavix [Clopidogrel Bisulfate] Other (See Comments)    Patient is noted to be a nonresponder to Plavix    Current Outpatient Medications  Medication Sig Dispense Refill  . allopurinol (ZYLOPRIM) 100 MG tablet Take 100 mg by mouth every morning.    Marland Kitchen amLODipine (NORVASC) 10 MG tablet Take 1 tablet (10 mg total) by mouth daily. (Patient taking differently: Take 730 mg by mouth every morning.) 90 tablet 3  . aspirin 81 MG tablet Take 81 mg by mouth every morning.    Marland Kitchen atorvastatin (LIPITOR) 80 MG tablet TAKE 1 TABLET BY MOUTH EVERY DAY (Patient taking differently: Take 80 mg by mouth at bedtime.) 90 tablet 3  . carvedilol (COREG) 25 MG tablet TAKE 1 TABLET BY  MOUTH TWO TIMES DAILY WITH A MEAL (Patient taking differently: Take 25 mg by mouth in the morning and at bedtime.) 180 tablet 3  . chlorthalidone (HYGROTON) 25 MG tablet TAKE 1 TABLET BY MOUTH EVERY DAY 90 tablet 3  . empagliflozin (JARDIANCE) 25 MG TABS tablet Take 25 mg by mouth daily.    . fenofibrate (TRICOR) 145 MG tablet TAKE 1 TABLET BY MOUTH EVERY DAY 90 tablet 3  . gabapentin (NEURONTIN) 100 MG capsule Take 100 mg by mouth 2 (two) times daily as needed (pain).    . isosorbide mononitrate (IMDUR) 120 MG 24 hr tablet Take 1 tablet (120 mg total) by mouth daily. (Patient taking differently: Take 120 mg by mouth at bedtime.) 90 tablet 3  . losartan (COZAAR) 100 MG tablet Take 100 mg by mouth every morning.     . metFORMIN (GLUCOPHAGE-XR) 500  MG 24 hr tablet Take 500 mg by mouth 2 (two) times daily.    . nitroGLYCERIN (NITROSTAT) 0.4 MG SL tablet Place 1 tablet (0.4 mg total) under the tongue every 5 (five) minutes as needed for chest pain. 25 tablet 11  . omeprazole (PRILOSEC) 20 MG capsule Take 20 mg by mouth every morning.     Marland Kitchen oxyCODONE-acetaminophen (PERCOCET/ROXICET) 5-325 MG tablet Take 1 tablet by mouth every 6 (six) hours as needed for moderate pain. 30 tablet 0  . SOLIQUA 100-33 UNT-MCG/ML SOPN Inject 60 Units into the skin daily.     No current facility-administered medications for this visit.    REVIEW OF SYSTEMS:  [X]  denotes positive finding, [ ]  denotes negative finding Cardiac  Comments:  Chest pain or chest pressure:    Shortness of breath upon exertion:    Short of breath when lying flat:    Irregular heart rhythm:        Vascular    Pain in calf, thigh, or hip brought on by ambulation:    Pain in feet at night that wakes you up from your sleep:     Blood clot in your veins:    Leg swelling:         Pulmonary    Oxygen at home:    Productive cough:     Wheezing:         Neurologic    Sudden weakness in arms or legs:     Sudden numbness in arms or legs:      Sudden onset of difficulty speaking or slurred speech:    Temporary loss of vision in one eye:     Problems with dizziness:         Gastrointestinal    Blood in stool:     Vomited blood:         Genitourinary    Burning when urinating:     Blood in urine:        Psychiatric    Major depression:         Hematologic    Bleeding problems:    Problems with blood clotting too easily:        Skin    Rashes or ulcers:        Constitutional    Fever or chills:      PHYSICAL EXAM: Vitals:   08/16/20 1432  BP: 101/65  Pulse: (!) 105  Resp: 18  Temp: 97.6 F (36.4 C)  TempSrc: Temporal  SpO2: 94%  Weight: 194 lb (88 kg)  Height: 5' 10.5" (1.791 m)    GENERAL: The patient is a well-nourished male, in no acute distress. The vital signs are documented above. CARDIAC: There is a regular rate and rhythm.  VASCULAR:  Left groin incision healing well Left DP PT signals triphasic by doppler Left leg incisions healing  DATA:   None  Assessment/Plan:  75 year old male status post left common femoral artery to below-knee popliteal bypass with ipsilateral nonreversed great saphenous vein for critical limb ischemia with rest pain.  At the time of surgery he had clear progression of claudication symptoms to rest pain after being evaluated in the ED by Dr. 10/16/20.  On exam today his incisions are healing appropriately and he has triphasic DP and PT signals at the left ankle.  Discussed the swelling is very common and we talked about leg elevation and I also sized him for knee-high compression stockings 15-20 mm Hg for lighter compression.  Will arrange  follow-up in 6 months with left leg arterial duplex and ABIs for surveillance of his bypass.   [name], MD [facility] Office: [phone]

## 2020-08-18 ENCOUNTER — Other Ambulatory Visit: Payer: Self-pay

## 2020-08-18 DIAGNOSIS — I739 Peripheral vascular disease, unspecified: Secondary | ICD-10-CM

## 2020-09-05 ENCOUNTER — Other Ambulatory Visit: Payer: Self-pay | Admitting: Cardiology

## 2020-12-11 NOTE — Progress Notes (Deleted)
Cardiology Office Note   Date:  12/11/2020   ID:  [name] [name], DOB 10-Oct-1945, MRN [id number]  PCP:  Rebekah Chesterfield, NP  Cardiologist:   Rollene Rotunda, MD   No chief complaint on file.     History of Present Illness: [name] [name] is a 75 y.o. male who presents for for follow up of his CAD.  He was admitted Sept 2015 with non-ST elevation MI. He was found to have an occluded right coronary artery but patent LAD stents. He was managed medically.   He was in the hospital in April.  He had pneumonia.  He had C. difficile.  He also had non-Q wave MI with disease as listed below and ended up with staged PCI.  Ejection fraction was 45% which is essentially unchanged from previous.  He saw Dr. Kirke Corin with leg pain and PVD.    Feb 2020 he did have increasing shortness of breath and some chest discomfort.  He was diagnosed with NSTEMI.   He underwent PCI as below by Dr. Okey Dupre at that time.  .     Since I last saw him ***   ***  he has had lower extremity bypass.  I reviewed these records for this appointment.  He presented with pain and had left popliteal to below the popliteal artery bypass.  This was earlier this month.  He had presented with progressive pain resulting eventually in rest pain.  He said that he is having less pain since that time.  He has been walking with a walker.  He did okay with this from a cardiovascular standpoint.  He has not had any chest pressure, neck or arm discomfort.    Past Medical History:  Diagnosis Date   CAP (community acquired pneumonia) 10/11/2017   Coronary artery disease    a. Cath: 2000 nonobs dz. b. 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 s/p BMS to LAD & RCA ; s/p PCI/DES dRCA & PDA x2 2011. d. cath 02/17/14: LM patent, LAD calcified, diffuse irregs, no high grade stenosis mLAD stent patent, LCx, diffuse irregs no high-grade dz, RCA total occ w/ L to R collats, medical Rx rec   Diabetes mellitus    Diastolic  dysfunction    a. echo 02/18/14: EF 45-50%, mild LVH, AK of inferior and inferoseptal myocardium, GR1DD   Hiatal hernia    Hyperlipidemia    Hypertension    LV dysfunction    NSTEMI (non-ST elevated myocardial infarction) (HCC) 10/07/2017   PVD (peripheral vascular disease) (HCC)    ABIs of 0.69 on right and 0.93 on left   Renal artery stenosis (HCC)    Mild, bilateral   S/P angioplasty with stent, atherectomy DES to pLAD and DES to mLAD 10/10/17  10/11/2017   Sleep apnea    Mild    Past Surgical History:  Procedure Laterality Date   ABDOMINAL AORTOGRAM W/LOWER EXTREMITY N/A 07/21/2020   Procedure: ABDOMINAL AORTOGRAM W/LOWER EXTREMITY;  Surgeon: [name], MD;  Location: [facility];  Service: Cardiovascular;  Laterality: N/A;   CATARACT EXTRACTION     CORONARY ATHERECTOMY N/A 10/10/2017   Procedure: CORONARY ATHERECTOMY - CSI;  Surgeon: Yvonne Kendall, MD;  Location: [facility];  Service: Cardiovascular;  Laterality: N/A;   CORONARY STENT INTERVENTION N/A 10/10/2017   Procedure: CORONARY STENT INTERVENTION;  Surgeon: Yvonne Kendall, MD;  Location: [facility];  Service: Cardiovascular;  Laterality: N/A;  CORONARY STENT INTERVENTION N/A 07/21/2018   Procedure: CORONARY STENT INTERVENTION;  Surgeon: Yvonne Kendall, MD;  [facility]: [facility];  Service: Cardiovascular;  Laterality: N/A;   FEMORAL-POPLITEAL BYPASS GRAFT Left 07/27/2020   Procedure: LEFT FEMORAL TO BELOW KNEE POPLITEAL ARTERY BYPASS;  Surgeon: [name], MD;  Location: [facility] OR;  Service: Vascular;  Laterality: Left;   HIATAL HERNIA REPAIR     INTRAVASCULAR ULTRASOUND/IVUS N/A 10/09/2017   Procedure: Intravascular Ultrasound/IVUS;  Surgeon: [name], MD;  Location: [facility] INVASIVE CV LAB;  Service: Cardiovascular;  Laterality: N/A;   INTRAVASCULAR ULTRASOUND/IVUS N/A 07/21/2018   Procedure: Intravascular Ultrasound/IVUS;  Surgeon: Yvonne Kendall, MD;  [facility]: [facility];  Service: Cardiovascular;  Laterality: N/A;   LEFT HEART CATH AND CORONARY ANGIOGRAPHY N/A 10/09/2017   Procedure: LEFT HEART CATH AND CORONARY ANGIOGRAPHY;  Surgeon: [name], MD;  Location[facility] INVASIVE CV LAB;  Service: Cardiovascular;  Laterality: N/A;   LEFT HEART CATH AND CORONARY ANGIOGRAPHY N/A 07/21/2018   Procedure: LEFT HEART CATH AND CORONARY ANGIOGRAPHY;  Surgeon: Yvonne Kendall, MD;  [facility]: [facility];  Service: Cardiovascular;  Laterality: N/A;   LEFT HEART CATHETERIZATION WITH CORONARY ANGIOGRAM N/A 02/17/2014   Procedure: LEFT HEART CATHETERIZATION WITH CORONARY ANGIOGRAM;  Surgeon: Micheline Chapman, MD;  Location: [facility] CATH LAB;  Service: Cardiovascular;  Laterality: N/A;   SHOULDER ARTHROSCOPY     Right   SOFT TISSUE TUMOR RESECTION     Vocal cord tumor, abdominal   SOFT TISSUE TUMOR RESECTION     Benign neck tumor   VEIN HARVEST Left 07/27/2020   Procedure: VEIN HARVEST USING  GREATER SAPHENOUS VEIN;  Surgeon: [name], MD;  Location: [facility] OR;  Service: Vascular;  Laterality: Left;   Vocal cord polyp removal       Current Outpatient Medications  Medication Sig Dispense Refill   allopurinol (ZYLOPRIM) 100 MG tablet Take 100 mg by mouth every morning.     amLODipine (NORVASC) 10 MG tablet Take 1 tablet (10 mg total) by mouth daily. (Patient taking differently: Take 730 mg by mouth every morning.) 90 tablet 3   aspirin 81 MG tablet Take 81 mg by mouth every morning.     atorvastatin (LIPITOR) 80 MG tablet TAKE 1 TABLET BY MOUTH EVERY DAY (Patient taking differently: Take 80 mg by mouth at bedtime.) 90 tablet 3   carvedilol (COREG) 25 MG tablet TAKE 1 TABLET BY MOUTH TWO TIMES DAILY WITH A MEAL (Patient taking differently: Take 25 mg by mouth in the morning and at bedtime.) 180 tablet 3   chlorthalidone (HYGROTON) 25 MG tablet TAKE 1 TABLET BY MOUTH EVERY DAY 90 tablet 3   empagliflozin (JARDIANCE) 25 MG TABS tablet Take 25 mg by mouth daily.      fenofibrate (TRICOR) 145 MG tablet TAKE 1 TABLET BY MOUTH EVERY DAY 90 tablet 3   gabapentin (NEURONTIN) 100 MG capsule Take 100 mg by mouth 2 (two) times daily as needed (pain).     isosorbide mononitrate (IMDUR) 120 MG 24 hr tablet TAKE 1 TABLET (120 MG TOTAL) BY MOUTH DAILY. 90 tablet 3   losartan (COZAAR) 100 MG tablet Take 100 mg by mouth every morning.      metFORMIN (GLUCOPHAGE-XR) 500 MG 24 hr tablet Take 500 mg by mouth 2 (two) times daily.     nitroGLYCERIN (NITROSTAT) 0.4 MG SL tablet Place 1 tablet (0.4 mg total) under the tongue every 5 (five) minutes as needed  for chest pain. 25 tablet 11   omeprazole (PRILOSEC) 20 MG capsule Take 20 mg by mouth every morning.      oxyCODONE-acetaminophen (PERCOCET/ROXICET) 5-325 MG tablet Take 1 tablet by mouth every 6 (six) hours as needed for moderate pain. 30 tablet 0   SOLIQUA 100-33 UNT-MCG/ML SOPN Inject 60 Units into the skin daily.     No current facility-administered medications for this visit.    Allergies:   Bee venom, Other, Penicillin g, Penicillins, and Plavix [clopidogrel bisulfate]    ROS:  Please see the history of present illness.   Otherwise, review of systems are positive for *** .   All other systems are reviewed and negative.    PHYSICAL EXAM: VS:  There were no vitals taken for this visit. , BMI There is no height or weight on file to calculate BMI. GENERAL:  Well appearing NECK:  No jugular venous distention, waveform within normal limits, carotid upstroke brisk and symmetric, no bruits, no thyromegaly LUNGS:  Clear to auscultation bilaterally CHEST:  Unremarkable HEART:  PMI not displaced or sustained,S1 and S2 within normal limits, no S3, no S4, no clicks, no rubs, *** murmurs ABD:  Flat, positive bowel sounds normal in frequency in pitch, no bruits, no rebound, no guarding, no midline pulsatile mass, no hepatomegaly, no splenomegaly EXT:  2 plus pulses throughout, no edema, no cyanosis no  clubbing    ***GENERAL:  Well appearing NECK:  No jugular venous distention, waveform within normal limits, carotid upstroke brisk and symmetric, no bruits, no thyromegaly LUNGS:  Clear to auscultation bilaterally CHEST:  Unremarkable HEART:  PMI not displaced or sustained,S1 and S2 within normal limits, no S3, no S4, no clicks, no rubs, no murmurs ABD:  Flat, positive bowel sounds normal in frequency in pitch, no bruits, no rebound, no guarding, no midline pulsatile mass, no hepatomegaly, no splenomegaly EXT:  2 plus pulses upper, absent dorsalis pedis and posterior tibialis bilateral lower, left greater than right leg edema, no cyanosis no clubbing   EKG:  EKG is *** ordered today. The ekg ordered today demonstrates sinus rhythm, rate ***, axis within normal limits, intervals within normal limits, premature ventricular contractions, nonspecific T wave changes, mildly prolonged QTC.  T wave inversions are perhaps very slightly increased compared to previous.   Recent Labs: 07/27/2020: ALT 14 07/28/2020: BUN 23; Creatinine, Ser 0.99; Hemoglobin 10.5; Platelets 188; Potassium 3.5; Sodium 139    Lipid Panel    Component Value Date/Time   CHOL 70 07/28/2020 0123   TRIG 219 (H) 07/28/2020 0123   HDL 17 (L) 07/28/2020 0123   CHOLHDL 4.1 07/28/2020 0123   VLDL 44 (H) 07/28/2020 0123   LDLCALC 9 07/28/2020 0123      Wt Readings from Last 3 Encounters:  08/16/20 194 lb (88 kg)  08/10/20 189 lb (85.7 kg)  07/27/20 203 lb 0.7 oz (92.1 kg)    Cardiac cath 07/21/18:   Diagnostic  Dominance: Right    Intervention       Conclusions: Three-vessel coronary artery disease, including 60-70% ostial LAD (MLA 3.3 cm^2 by IVUS), 90% stenosis at distal margin of old mid/distal LAD stent, 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 Successful PCI to mid/distal LAD using Resolute Onyx 2.0 x 18 mm DES (post-dilated to 2.6 mm). Successful IVUS-guided PCI to ostial LAD (arises  from aorta, given anomalous LCx) using Resolute Onyx 3.5 x 12 mm DES (post-dilated to 4.1 mm).    Other studies  Reviewed: Additional studies/ records that were reviewed today include: *** Review of the above records demonstrates:  Please see elsewhere in the note.     ASSESSMENT AND PLAN:  CAD:   ***  The patient has no new sypmtoms.  No further cardiovascular testing is indicated.  We will continue with aggressive risk reduction and meds as listed.  He will continue with aggressive risk reduction.   HTN:   The blood pressure is *** normal.  No change in therapy.   DM:   A1c is *** 8.3.  He is being managed aggressively but unfortunately he is going to be unable to continue to afford the current insulin regimen and he is going to discuss this with Rebekah Chesterfield, NP.  He will be continuing the Jardiance which has been started since I last saw him.  Of note his A1c previously was 9.7 and currently is 8.3.   PVD:  ***  He is now status post surgery as above and he will continue with risk reduction.  DYSLIPIDEMIA:   LDL was ***  listed at 9 with it total cholesterol of 70 which is for the lowest type seen and much different than previous.  I will have him get this repeated in a couple of months.    BRUIT:    ***   e had mild carotid plaque in 2019.     Current medicines are reviewed at length with the patient today.  The patient does not have concerns regarding medicines.  The following changes have been made:  ***  Labs/ tests ordered today include:  ***  No orders of the defined types were placed in this encounter.    Disposition:   FU with me in *** months     Signed, Rollene Rotunda, MD  12/11/2020 3:06 PM    [facility]

## 2020-12-14 ENCOUNTER — Ambulatory Visit: Payer: Medicare Other | Admitting: Cardiology

## 2021-02-14 ENCOUNTER — Ambulatory Visit (HOSPITAL_COMMUNITY)
Admission: RE | Admit: 2021-02-14 | Discharge: 2021-02-14 | Disposition: A | Discharge status: Home / Self Care | Payer: Medicare Other | Source: Ambulatory Visit | Attending: Vascular Surgery | Admitting: Vascular Surgery

## 2021-02-14 ENCOUNTER — Other Ambulatory Visit: Payer: Self-pay

## 2021-02-14 ENCOUNTER — Ambulatory Visit (INDEPENDENT_AMBULATORY_CARE_PROVIDER_SITE_OTHER): Payer: Medicare Other | Admitting: Vascular Surgery

## 2021-02-14 ENCOUNTER — Ambulatory Visit (INDEPENDENT_AMBULATORY_CARE_PROVIDER_SITE_OTHER)
Admission: RE | Admit: 2021-02-14 | Discharge: 2021-02-14 | Disposition: A | Discharge status: Home / Self Care | Payer: Medicare Other | Source: Ambulatory Visit | Attending: Vascular Surgery | Admitting: Vascular Surgery

## 2021-02-14 VITALS — BP 155/71 | HR 86 | Temp 98.1°F | Resp 18 | Ht 70.0 in | Wt 200.0 lb

## 2021-02-14 DIAGNOSIS — I739 Peripheral vascular disease, unspecified: Secondary | ICD-10-CM

## 2021-02-14 NOTE — Progress Notes (Signed)
[name] MRN: [id number] DOB: Sep 11, 1945 Sex: male  REASON FOR VISIT: 73-month follow-up for surveillance of left leg bypass  HPI: [name] [name] is a 75 y.o. male with multiple medical problems that presents for 9-month follow-up of left leg bypass.  He previously had left common femoral to below-knee popliteal artery bypass with great saphenous vein for critical limb ischemia with rest pain on 07/27/2020. He was initially seen by Dr. Arbie Cookey in the ED with clear progression of claudication to rest pain prior to his bypass.    His only complaint today is continued swelling in the left leg.  He elevates his leg at night.  He tried wearing compression stockings but states these are uncomfortable.  He is not smoking.  Past Medical History:  Diagnosis Date   CAP (community acquired pneumonia) 10/11/2017   Coronary artery disease    a. Cath: 2000 nonobs dz. b. 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 s/p BMS to LAD & RCA ; s/p PCI/DES dRCA & PDA x2 2011. d. cath 02/17/14: LM patent, LAD calcified, diffuse irregs, no high grade stenosis mLAD stent patent, LCx, diffuse irregs no high-grade dz, RCA total occ w/ L to R collats, medical Rx rec   Diabetes mellitus    Diastolic dysfunction    a. echo 02/18/14: EF 45-50%, mild LVH, AK of inferior and inferoseptal myocardium, GR1DD   Hiatal hernia    Hyperlipidemia    Hypertension    LV dysfunction    NSTEMI (non-ST elevated myocardial infarction) (HCC) 10/07/2017   PVD (peripheral vascular disease) (HCC)    ABIs of 0.69 on right and 0.93 on left   Renal artery stenosis (HCC)    Mild, bilateral   S/P angioplasty with stent, atherectomy DES to pLAD and DES to mLAD 10/10/17  10/11/2017   Sleep apnea    Mild    Past Surgical History:  Procedure Laterality Date   ABDOMINAL AORTOGRAM W/LOWER EXTREMITY N/A 07/21/2020   Procedure: ABDOMINAL AORTOGRAM W/LOWER EXTREMITY;  Surgeon: [name], MD;   Location: [facility];  Service: Cardiovascular;  Laterality: N/A;   CATARACT EXTRACTION     CORONARY ATHERECTOMY N/A 10/10/2017   Procedure: CORONARY ATHERECTOMY - CSI;  Surgeon: Yvonne Kendall, MD;  Location: [facility];  Service: Cardiovascular;  Laterality: N/A;   CORONARY STENT INTERVENTION N/A 10/10/2017   Procedure: CORONARY STENT INTERVENTION;  Surgeon: Yvonne Kendall, MD;  Location: [facility];  Service: Cardiovascular;  Laterality: N/A;   CORONARY STENT INTERVENTION N/A 07/21/2018   Procedure: CORONARY STENT INTERVENTION;  Surgeon: Yvonne Kendall, MD;  Location: [facility];  Service: Cardiovascular;  Laterality: N/A;   FEMORAL-POPLITEAL BYPASS GRAFT Left 07/27/2020   Procedure: LEFT FEMORAL TO BELOW KNEE POPLITEAL ARTERY BYPASS;  Surgeon: [name], MD;  Location: [facility] OR;  Service: Vascular;  Laterality: Left;   HIATAL HERNIA REPAIR     INTRAVASCULAR ULTRASOUND/IVUS N/A 10/09/2017   Procedure: Intravascular Ultrasound/IVUS;  Surgeon: [name], MD;  Location: [facility]VASIVE CV LAB;  Service: Cardiovascular;  Laterality: N/A;   INTRAVASCULAR ULTRASOUND/IVUS N/A 07/21/2018   Procedure: Intravascular Ultrasound/IVUS;  Surgeon: Yvonne Kendall, MD;  Location: [facility];  Service: Cardiovascular;  Laterality: N/A;   LEFT HEART CATH AND CORONARY ANGIOGRAPHY N/A 10/09/2017   Procedure: LEFT HEART CATH AND CORONARY ANGIOGRAPHY;  Surgeon: [name], MD;  Location: [facility] INVASIVE CV LAB;  Service: Cardiovascular;  Laterality:  N/A;   LEFT HEART CATH AND CORONARY ANGIOGRAPHY N/A 07/21/2018   Procedure: LEFT HEART CATH AND CORONARY ANGIOGRAPHY;  Surgeon: Yvonne Kendall, MD;  Location: [facility];  Service: Cardiovascular;  Laterality: N/A;   LEFT HEART CATHETERIZATION WITH CORONARY ANGIOGRAM N/A 02/17/2014   Procedure: LEFT HEART CATHETERIZATION WITH CORONARY ANGIOGRAM;  Surgeon: Micheline Chapman, MD;  Location[facility] CATH LAB;  Service:  Cardiovascular;  Laterality: N/A;   SHOULDER ARTHROSCOPY     Right   SOFT TISSUE TUMOR RESECTION     Vocal cord tumor, abdominal   SOFT TISSUE TUMOR RESECTION     Benign neck tumor   VEIN HARVEST Left 07/27/2020   Procedure: VEIN HARVEST USING  GREATER SAPHENOUS VEIN;  Surgeon: [name], MD;  Location: [facility] OR;  Service: Vascular;  Laterality: Left;   Vocal cord polyp removal      Family History  Problem Relation Age of Onset   Heart attack Father 58   Cancer Mother    Cancer Brother     SOCIAL HISTORY: Social History   Tobacco Use   Smoking status: Former    Types: Cigarettes    Quit date: 06/19/2003    Years since quitting: 17.6   Smokeless tobacco: Never  Substance Use Topics   Alcohol use: No    Allergies  Allergen Reactions   Bee Venom Swelling and Other (See Comments)   Other Other (See Comments)   Penicillin G Other (See Comments)   Penicillins Itching and Swelling    Did it involve swelling of the face/tongue/throat, SOB, or low BP? Yes Did it involve sudden or severe rash/hives, skin peeling, or any reaction on the inside of your mouth or nose? No-itching Did you need to seek medical attention at a hospital or doctor's office? Yes When did it last happen? Over 10 years        If all above answers are "NO", may proceed with cephalosporin use.    Plavix [Clopidogrel Bisulfate] Other (See Comments)    Patient is noted to be a nonresponder to Plavix    Current Outpatient Medications  Medication Sig Dispense Refill   allopurinol (ZYLOPRIM) 100 MG tablet Take 100 mg by mouth every morning.     amLODipine (NORVASC) 10 MG tablet Take 1 tablet (10 mg total) by mouth daily. (Patient taking differently: Take 730 mg by mouth every morning.) 90 tablet 3   aspirin 81 MG tablet Take 81 mg by mouth every morning.     atorvastatin (LIPITOR) 80 MG tablet TAKE 1 TABLET BY MOUTH EVERY DAY (Patient taking differently: Take 80 mg by mouth at bedtime.) 90 tablet 3    carvedilol (COREG) 25 MG tablet TAKE 1 TABLET BY MOUTH TWO TIMES DAILY WITH A MEAL (Patient taking differently: Take 25 mg by mouth in the morning and at bedtime.) 180 tablet 3   chlorthalidone (HYGROTON) 25 MG tablet TAKE 1 TABLET BY MOUTH EVERY DAY 90 tablet 3   empagliflozin (JARDIANCE) 25 MG TABS tablet Take 25 mg by mouth daily.     fenofibrate (TRICOR) 145 MG tablet TAKE 1 TABLET BY MOUTH EVERY DAY 90 tablet 3   gabapentin (NEURONTIN) 100 MG capsule Take 100 mg by mouth 2 (two) times daily as needed (pain).     isosorbide mononitrate (IMDUR) 120 MG 24 hr tablet TAKE 1 TABLET (120 MG TOTAL) BY MOUTH DAILY. 90 tablet 3   losartan (COZAAR) 100 MG tablet Take 100 mg by mouth every morning.  metFORMIN (GLUCOPHAGE-XR) 500 MG 24 hr tablet Take 500 mg by mouth 2 (two) times daily.     nitroGLYCERIN (NITROSTAT) 0.4 MG SL tablet Place 1 tablet (0.4 mg total) under the tongue every 5 (five) minutes as needed for chest pain. 25 tablet 11   omeprazole (PRILOSEC) 20 MG capsule Take 20 mg by mouth every morning.      SOLIQUA 100-33 UNT-MCG/ML SOPN Inject 60 Units into the skin daily.     oxyCODONE-acetaminophen (PERCOCET/ROXICET) 5-325 MG tablet Take 1 tablet by mouth every 6 (six) hours as needed for moderate pain. (Patient not taking: Reported on 02/14/2021) 30 tablet 0   No current facility-administered medications for this visit.    REVIEW OF SYSTEMS:  [X]  denotes positive finding, [ ]  denotes negative finding Cardiac  Comments:  Chest pain or chest pressure:    Shortness of breath upon exertion:    Short of breath when lying flat:    Irregular heart rhythm:        Vascular    Pain in calf, thigh, or hip brought on by ambulation:    Pain in feet at night that wakes you up from your sleep:     Blood clot in your veins:    Leg swelling:         Pulmonary    Oxygen at home:    Productive cough:     Wheezing:         Neurologic    Sudden weakness in arms or legs:     Sudden numbness in  arms or legs:     Sudden onset of difficulty speaking or slurred speech:    Temporary loss of vision in one eye:     Problems with dizziness:         Gastrointestinal    Blood in stool:     Vomited blood:         Genitourinary    Burning when urinating:     Blood in urine:        Psychiatric    Major depression:         Hematologic    Bleeding problems:    Problems with blood clotting too easily:        Skin    Rashes or ulcers:        Constitutional    Fever or chills:      PHYSICAL EXAM: Vitals:   02/14/21 1427  BP: (!) 155/71  Pulse: 86  Resp: 18  Temp: 98.1 F (36.7 C)  TempSrc: Temporal  SpO2: 94%  Weight: 200 lb (90.7 kg)  Height: 5\' 10"  (1.778 m)    GENERAL: The patient is a well-nourished male, in no acute distress. The vital signs are documented above. CARDIAC: There is a regular rate and rhythm.  VASCULAR:  Left leg incisions well-healed Left femoral pulse palpable Left PT near triphasic and brisk AT signal as well  DATA:   ABIs today are 0.45 on the right monophasic and 1.0 on the left monophasic  Left leg arterial duplex shows widely patent bypass without any stenosis in the bypass but there is some moderate outflow stenosis.  Assessment/Plan:  75 year old male that underwent a left common femoral to below-knee popliteal bypass with ipsilateral non reversed great saphenous vein on 07/27/2020.  This was for critical limb ischemia with rest pain.  He is doing well today and his bypass is patent on duplex.  No left lower extremity symptoms.  He has brisk Doppler signals  in the foot today.  He is having no symptoms in his contralateral right leg.  Discussed overall his duplex looks pretty good.  I will see him again in 1 year with noninvasive imaging with left leg arterial duplex and ABIs in the [facility] clinic.  Discussed he call with any questions or concerns  [name], MD [facility]:  [phone]

## 2021-04-11 NOTE — Progress Notes (Signed)
Cardiology Office Note   Date:  04/12/2021   ID:  [name] [name], DOB [birth year], MRN [id number]  PCP:  Rebekah Chesterfield, NP  Cardiologist:   Rollene Rotunda, MD   Chief Complaint  Patient presents with   Coronary Artery Disease       History of Present Illness: [name] [name] is a 75 y.o. male who presents for for follow up of his CAD.  He was admitted Sept 2015 with non-ST elevation MI. He was found to have an occluded right coronary artery but patent LAD stents. He was managed medically.   He was in the hospital in April 2019.  He had pneumonia.  He had C. difficile.  He also had non-Q wave MI with disease as listed below and ended up with staged PCI.  Ejection fraction was 45% which is essentially unchanged from previous.  He saw Dr. Kirke Corin with leg pain and PVD.    Feb 2020 he did have increasing shortness of breath and some chest discomfort.  He was diagnosed with NSTEMI.   He underwent PCI as below by Dr. Okey Dupre at that time. He has had lower extremity bypass.    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 The patient denies any new symptoms such as chest discomfort, neck or arm discomfort. There has been no new shortness of breath, PND or orthopnea. There have been no reported palpitations, presyncope or syncope.  Past Medical History:  Diagnosis Date   CAP (community acquired pneumonia) 10/11/2017   Coronary artery disease    a. Cath: 2000 nonobs dz. b. 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 s/p BMS to LAD & RCA ; s/p PCI/DES dRCA & PDA x2 2011. d. cath 02/17/14: LM patent, LAD calcified, diffuse irregs, no high grade stenosis mLAD stent patent, LCx, diffuse irregs no high-grade dz, RCA total occ w/ L to R collats, medical Rx rec   Diabetes mellitus    Diastolic dysfunction    a. echo 02/18/14: EF 45-50%, mild LVH, AK of inferior and inferoseptal myocardium, GR1DD   Hiatal  hernia    Hyperlipidemia    Hypertension    LV dysfunction    NSTEMI (non-ST elevated myocardial infarction) (HCC) 10/07/2017   PVD (peripheral vascular disease) (HCC)    ABIs of 0.69 on right and 0.93 on left   Renal artery stenosis (HCC)    Mild, bilateral   S/P angioplasty with stent, atherectomy DES to pLAD and DES to mLAD 10/10/17  10/11/2017   Sleep apnea    Mild    Past Surgical History:  Procedure Laterality Date   ABDOMINAL AORTOGRAM W/LOWER EXTREMITY N/A 07/21/2020   Procedure: ABDOMINAL AORTOGRAM W/LOWER EXTREMITY;  Surgeon: [name], MD;  Location: [facility];  Service: Cardiovascular;  Laterality: N/A;   CATARACT EXTRACTION     CORONARY ATHERECTOMY N/A 10/10/2017   Procedure: CORONARY ATHERECTOMY - CSI;  Surgeon: Yvonne Kendall, MD;  Location: [facility];  Service: Cardiovascular;  Laterality: N/A;   CORONARY STENT INTERVENTION N/A 10/10/2017   Procedure: CORONARY STENT INTERVENTION;  Surgeon: Yvonne Kendall, MD;  Location: [facility];  Service: Cardiovascular;  Laterality: N/A;   CORONARY STENT INTERVENTION N/A 07/21/2018   Procedure: CORONARY STENT INTERVENTION;  Surgeon: Yvonne Kendall, MD;  Location: [facility];  Service: Cardiovascular;  Laterality: N/A;   FEMORAL-POPLITEAL BYPASS GRAFT  Left 07/27/2020   Procedure: LEFT FEMORAL TO BELOW KNEE POPLITEAL ARTERY BYPASS;  Surgeon: [name], MD;  Location: [facility] OR;  Service: Vascular;  Laterality: Left;   HIATAL HERNIA REPAIR     INTRAVASCULAR ULTRASOUND/IVUS N/A 10/09/2017   Procedure: Intravascular Ultrasound/IVUS;  Surgeon: [name], MD;  Location: [facility] INV[facility]IVE CV [facility];  Service: Cardiovascular;  Laterality: N/A;   INTRAVASCULAR ULTRASOUND/IVUS N/A 07/21/2018   Procedure: Intravascular Ultrasound/IVUS;  Surgeon: Yvonne Kendall, MD;  Location: [facility];  Service: Cardiovascular;  Laterality: N/A;   LEFT HEART CATH AND CORONARY ANGIOGRAPHY N/A 10/09/2017    Procedure: LEFT HEART CATH AND CORONARY ANGIOGRAPHY;  Surgeon: [name], MD;  Location: [facility] INVASIVE CV LAB;  Service: Cardiovascular;  Laterality: N/A;   LEFT HEART CATH AND CORONARY ANGIOGRAPHY N/A 07/21/2018   Procedure: LEFT HEART CATH AND CORONARY ANGIOGRAPHY;  Surgeon: Yvonne Kendall, MD;  Location: [facility];  Service: Cardiovascular;  Laterality: N/A;   LEFT HEART CATHETERIZATION WITH CORONARY ANGIOGRAM N/A 02/17/2014   Procedure: LEFT HEART CATHETERIZATION WITH CORONARY ANGIOGRAM;  Surgeon: Micheline Chapman, MD;  Location: [facility] C[facility] LAB;  Service: Cardiovascular;  Laterality: N/A;   SHOULDER ARTHROSCOPY     Right   SOFT TISSUE TUMOR RESECTION     Vocal cord tumor, abdominal   SOFT TISSUE TUMOR RESECTION     Benign neck tumor   VEIN HARVEST Left 07/27/2020   Procedure: VEIN HARVEST USING  GREATER SAPHENOUS VEIN;  Surgeon: [name], MD;  Location: [facility] OR;  Service: Vascular;  Laterality: Left;   Vocal cord polyp removal       Current Outpatient Medications  Medication Sig Dispense Refill   allopurinol (ZYLOPRIM) 100 MG tablet Take 100 mg by mouth every morning.     amLODipine (NORVASC) 10 MG tablet Take 1 tablet (10 mg total) by mouth daily. (Patient taking differently: Take 730 mg by mouth every morning.) 90 tablet 3   aspirin 81 MG tablet Take 81 mg by mouth every morning.     atorvastatin (LIPITOR) 80 MG tablet TAKE 1 TABLET BY MOUTH EVERY DAY (Patient taking differently: Take 80 mg by mouth at bedtime.) 90 tablet 3   carvedilol (COREG) 25 MG tablet TAKE 1 TABLET BY MOUTH TWO TIMES DAILY WITH A MEAL (Patient taking differently: Take 25 mg by mouth in the morning and at bedtime.) 180 tablet 3   chlorthalidone (HYGROTON) 25 MG tablet TAKE 1 TABLET BY MOUTH EVERY DAY 90 tablet 3   empagliflozin (JARDIANCE) 25 MG TABS tablet Take 25 mg by mouth daily.     fenofibrate (TRICOR) 145 MG tablet TAKE 1 TABLET BY MOUTH EVERY DAY 90 tablet 3   Insulin Glargine (TOUJEO  MAX SOLOSTAR [place]) Inject into the skin daily.     isosorbide mononitrate (IMDUR) 120 MG 24 hr tablet TAKE 1 TABLET (120 MG TOTAL) BY MOUTH DAILY. 90 tablet 3   losartan (COZAAR) 100 MG tablet Take 100 mg by mouth every morning.      metFORMIN (GLUCOPHAGE-XR) 500 MG 24 hr tablet Take 500 mg by mouth 2 (two) times daily.     nitroGLYCERIN (NITROSTAT) 0.4 MG SL tablet Place 1 tablet (0.4 mg total) under the tongue every 5 (five) minutes as needed for chest pain. 25 tablet 11   omeprazole (PRILOSEC) 20 MG capsule Take 20 mg by mouth every morning.      gabapentin (NEURONTIN) 100 MG capsule Take 100 mg by mouth 2 (two) times daily  as needed (pain). (Patient not taking: Reported on 04/12/2021)     oxyCODONE-acetaminophen (PERCOCET/ROXICET) 5-325 MG tablet Take 1 tablet by mouth every 6 (six) hours as needed for moderate pain. (Patient not taking: No sig reported) 30 tablet 0   SOLIQUA 100-33 UNT-MCG/ML SOPN Inject 60 Units into the skin daily. (Patient not taking: Reported on 04/12/2021)     No current facility-administered medications for this visit.    Allergies:   Bee venom, Other, Penicillin g, Penicillins, and Plavix [clopidogrel bisulfate]    ROS:  Please see the history of present illness.   Otherwise, review of systems are positive for none.   All other systems are reviewed and negative.    PHYSICAL EXAM: VS:  BP 120/68   Pulse 82   Ht 5' 10.5" (1.791 m)   Wt 199 lb (90.3 kg)   BMI 28.15 kg/m  , BMI Body mass index is 28.15 kg/m. GENERAL:  Well appearing NECK:  No jugular venous distention, waveform within normal limits, carotid upstroke brisk and symmetric, no bruits, no thyromegaly LUNGS:  Clear to auscultation bilaterally CHEST:  Unremarkable HEART:  PMI not displaced or sustained,S1 and S2 within normal limits, no S3, no S4, no clicks, no rubs, no murmurs ABD:  Flat, positive bowel sounds normal in frequency in pitch, no bruits, no rebound, no guarding, no midline pulsatile mass,  no hepatomegaly, no splenomegaly EXT:  2 plus pulses upper and decreased dorsalis pedis and posterior tibialis bilaterally, left leg pain edema, no cyanosis no clubbing    EKG:  EKG is   ordered today. The ekg ordered today demonstrates sinus rhythm, rate 82, axis within normal limits, intervals within normal limits, premature ventricular contractions, nonspecific T wave changes, nonspecific ST-T wave changes less pronounced than previous   Recent Labs: 07/27/2020: ALT 14 07/28/2020: BUN 23; Creatinine, Ser 0.99; Hemoglobin 10.5; Platelets 188; Potassium 3.5; Sodium 139    Lipid Panel    Component Value Date/Time   CHOL 70 07/28/2020 0123   TRIG 219 (H) 07/28/2020 0123   HDL 17 (L) 07/28/2020 0123   CHOLHDL 4.1 07/28/2020 0123   VLDL 44 (H) 07/28/2020 0123   LDLCALC 9 07/28/2020 0123      Wt Readings from Last 3 Encounters:  04/12/21 199 lb (90.3 kg)  02/14/21 200 lb (90.7 kg)  08/16/20 194 lb (88 kg)    Cardiac cath 07/21/18:   Diagnostic  Dominance: Right    Intervention       Conclusions: Three-vessel coronary artery disease, including 60-70% ostial LAD (MLA 3.3 cm^2 by IVUS), 90% stenosis at distal margin of old mid/distal LAD stent, 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 Successful PCI to mid/distal LAD using Resolute Onyx 2.0 x 18 mm DES (post-dilated to 2.6 mm). 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    Other studies Reviewed: Additional studies/ records that were reviewed today include: None Review of the above records demonstrates:  NA   ASSESSMENT AND PLAN:  CAD:   The patient has no new sypmtoms.  No further cardiovascular testing is indicated.  We will continue with aggressive risk reduction and meds as listed.   HTN:   The blood pressure is at target.  No change in therapy.    DM:   A1c is, by his report, in the sixes.  I  get these records from his primary provider.  PVD: He does have improvement in his left leg pain and is following up by [facility]  DYSLIPIDEMIA:   LDL was very low previously but I am going to see the results are.    BRUIT:  He had mild carotid plaque in 2019.   No further imaging   Current medicines are reviewed at length with the patient today.  The patient does not have concerns regarding medicines.  The following changes have been made: None  Labs/ tests ordered today include: None  Orders Placed This Encounter  Procedures   EKG 12-Lead      Disposition:   FU with me in 12 months     Signed, Rollene Rotunda, MD  04/12/2021 10:22 AM     [facility]

## 2021-04-12 ENCOUNTER — Encounter: Payer: Self-pay | Admitting: Cardiology

## 2021-04-12 ENCOUNTER — Other Ambulatory Visit: Payer: Self-pay

## 2021-04-12 ENCOUNTER — Ambulatory Visit (INDEPENDENT_AMBULATORY_CARE_PROVIDER_SITE_OTHER): Payer: Medicare Other | Admitting: Cardiology

## 2021-04-12 VITALS — BP 120/68 | HR 82 | Ht 70.5 in | Wt 199.0 lb

## 2021-04-12 DIAGNOSIS — E118 Type 2 diabetes mellitus with unspecified complications: Secondary | ICD-10-CM

## 2021-04-12 DIAGNOSIS — I739 Peripheral vascular disease, unspecified: Secondary | ICD-10-CM

## 2021-04-12 DIAGNOSIS — I2510 Atherosclerotic heart disease of native coronary artery without angina pectoris: Secondary | ICD-10-CM | POA: Diagnosis not present

## 2021-04-12 DIAGNOSIS — E785 Hyperlipidemia, unspecified: Secondary | ICD-10-CM

## 2021-04-12 DIAGNOSIS — I10 Essential (primary) hypertension: Secondary | ICD-10-CM | POA: Diagnosis not present

## 2021-04-12 DIAGNOSIS — R0989 Other specified symptoms and signs involving the circulatory and respiratory systems: Secondary | ICD-10-CM

## 2021-04-12 NOTE — Patient Instructions (Signed)
Medication Instructions:  The current medical regimen is effective;  continue present plan and medications.  *If you need a refill on your cardiac medications before your next appointment, please call your pharmacy*  Follow-Up: At [facility], you and your health needs are our priority.  As part of our continuing mission to provide you with exceptional heart care, we have created designated Provider Care Teams.  These Care Teams include your primary Cardiologist (physician) and Advanced Practice Providers (APPs -  Physician Assistants and Nurse Practitioners) who all work together to provide you with the care you need, when you need it.  We recommend signing up for the patient portal called "MyChart".  Sign up information is provided on this After Visit Summary.  MyChart is used to connect with patients for Virtual Visits (Telemedicine).  Patients are able to view lab/test results, encounter notes, upcoming appointments, etc.  Non-urgent messages can be sent to your provider as well.   To learn more about what you can do with MyChart, go to [email or web address].    Your next appointment:   1 year(s)  The format for your next appointment:   In Person  Provider:   James Hochrein, MD   Thank you for choosing [facility]!!    

## 2021-05-27 ENCOUNTER — Other Ambulatory Visit: Payer: Self-pay | Admitting: Cardiology

## 2021-07-19 ENCOUNTER — Other Ambulatory Visit: Payer: Self-pay | Admitting: Cardiology

## 2021-11-20 ENCOUNTER — Other Ambulatory Visit: Payer: Self-pay

## 2021-11-20 MED ORDER — ISOSORBIDE MONONITRATE ER 120 MG PO TB24: 120.0000 mg | ORAL_TABLET | Freq: Every day | ORAL | 3 refills | Status: DC

## 2022-02-08 ENCOUNTER — Other Ambulatory Visit: Payer: Self-pay | Admitting: *Deleted

## 2022-02-08 DIAGNOSIS — I739 Peripheral vascular disease, unspecified: Secondary | ICD-10-CM

## 2022-02-16 NOTE — Progress Notes (Unsigned)
HISTORY AND PHYSICAL     CC:  follow up. Requesting Provider:  Rebekah Chesterfield, NP  HPI: This is a 76 y.o. male who is here today for follow up for PAD.  Pt has hx of left CFA to below knee popliteal artery bypass with ipsilateral non reversed GSV on 07/27/2020 by Dr. Chestine Spore for CLI of the LLE with rest pain.   Pt was last seen 02/14/2021 and at that time, his main complaint was leg swelling after bypass.  He was elevating his leg and tried wearing compression but they were uncomfortable.  His duplex revealed patent bypass and brisk doppler signals.  He was scheduled for one year follow up.    The pt returns today for follow up.  He states he is doing well.  He denies any non healing wounds or rest pain.  He continues to have some swelling in the left leg since bypass.  He states that it gets better in the morning after having it elevated at night but swells throughout the day.  His walking is limited by both hips having pain.  He does not get claudication.  He tells me that his wife fell and was hospitalized for a 2 weeks and now has a rod in her leg.  She is not mobile currently and he is helping raise his 66 year old great granddaughter.    The pt is on a statin for cholesterol management.    The pt is on an aspirin.    Other AC:  none The pt is on CCB, BB, ARB for hypertension.  The pt does  have diabetes. Tobacco hx:  former  Pt does not have family hx of AAA.  Past Medical History:  Diagnosis Date   CAP (community acquired pneumonia) 10/11/2017   Coronary artery disease    a. Cath: 2000 nonobs dz. b. 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 s/p BMS to LAD & RCA ; s/p PCI/DES dRCA & PDA x2 2011. d. cath 02/17/14: LM patent, LAD calcified, diffuse irregs, no high grade stenosis mLAD stent patent, LCx, diffuse irregs no high-grade dz, RCA total occ w/ L to R collats, medical Rx rec   Diabetes mellitus    Diastolic dysfunction    a. echo 02/18/14: EF 45-50%,  mild LVH, AK of inferior and inferoseptal myocardium, GR1DD   Hiatal hernia    Hyperlipidemia    Hypertension    LV dysfunction    NSTEMI (non-ST elevated myocardial infarction) (HCC) 10/07/2017   PVD (peripheral vascular disease) (HCC)    ABIs of 0.69 on right and 0.93 on left   Renal artery stenosis (HCC)    Mild, bilateral   S/P angioplasty with stent, atherectomy DES to pLAD and DES to mLAD 10/10/17  10/11/2017   Sleep apnea    Mild    Past Surgical History:  Procedure Laterality Date   ABDOMINAL AORTOGRAM W/LOWER EXTREMITY N/A 07/21/2020   Procedure: ABDOMINAL AORTOGRAM W/LOWER EXTREMITY;  Surgeon: [name], MD;  Location: [facility];  Service: Cardiovascular;  Laterality: N/A;   CATARACT EXTRACTION     CORONARY ATHERECTOMY N/A 10/10/2017   Procedure: CORONARY ATHERECTOMY - CSI;  Surgeon: Yvonne Kendall, MD;  Location: [facility];  Service: Cardiovascular;  Laterality: N/A;   CORONARY STENT INTERVENTION N/A 10/10/2017   Procedure: CORONARY STENT INTERVENTION;  Surgeon: Yvonne Kendall, MD;  Location: [facility];  Service: Cardiovascular;  Laterality: N/A;  CORONARY STENT INTERVENTION N/A 07/21/2018   Procedure: CORONARY STENT INTERVENTION;  Surgeon: Yvonne Kendall, MD;  Location: [facility];  Service: Cardiovascular;  Laterality: N/A;   FEMORAL-POPLITEAL BYPASS GRAFT Left 07/27/2020   Procedure: LEFT FEMORAL TO BELOW KNEE POPLITEAL ARTERY BYPASS;  Surgeon: [name], MD;  Location: [facility] OR;  Service: Vascular;  Laterality: Left;   HIATAL HERNIA REPAIR     INTRAVASCULAR ULTRASOUND/IVUS N/A 10/09/2017   Procedure: Intravascular Ultrasound/IVUS;  Surgeon: [name], MD;  Location: [facility] INVASIVE CV LAB;  Service: Cardiovascular;  Laterality: N/A;   INTRAVASCULAR ULTRASOUND/IVUS N/A 07/21/2018   Procedure: Intravascular Ultrasound/IVUS;  Surgeon: Yvonne Kendall, MD;  Location: [facility];  Service: Cardiovascular;   Laterality: N/A;   LEFT HEART CATH AND CORONARY ANGIOGRAPHY N/A 10/09/2017   Procedure: LEFT HEART CATH AND CORONARY ANGIOGRAPHY;  Surgeon: [name], MD;  Location: [facility] INVASIVE CV LAB;  Service: Cardiovascular;  Laterality: N/A;   LEFT HEART CATH AND CORONARY ANGIOGRAPHY N/A 07/21/2018   Procedure: LEFT HEART CATH AND CORONARY ANGIOGRAPHY;  Surgeon: Yvonne Kendall, MD;  Location: [facility];  Service: Cardiovascular;  Laterality: N/A;   LEFT HEART CATHETERIZATION WITH CORONARY ANGIOGRAM N/A 02/17/2014   Procedure: LEFT HEART CATHETERIZATION WITH CORONARY ANGIOGRAM;  Surgeon: Micheline Chapman, MD;  Location: [facility] CATH LAB;  Service: Cardiovascular;  Laterality: N/A;   SHOULDER ARTHROSCOPY     Right   SOFT TISSUE TUMOR RESECTION     Vocal cord tumor, abdominal   SOFT TISSUE TUMOR RESECTION     Benign neck tumor   VEIN HARVEST Left 07/27/2020   Procedure: VEIN HARVEST USING  GREATER SAPHENOUS VEIN;  Surgeon: [name], MD;  Location: [facility] OR;  Service: Vascular;  Laterality: Left;   Vocal cord polyp removal      Allergies  Allergen Reactions   Bee Venom Swelling and Other (See Comments)   Other Other (See Comments)   Penicillin G Other (See Comments)   Penicillins Itching and Swelling    Did it involve swelling of the face/tongue/throat, SOB, or low BP? Yes Did it involve sudden or severe rash/hives, skin peeling, or any reaction on the inside of your mouth or nose? No-itching Did you need to seek medical attention at a hospital or doctor's office? Yes When did it last happen? Over 10 years        If all above answers are "NO", may proceed with cephalosporin use.    Plavix [Clopidogrel Bisulfate] Other (See Comments)    Patient is noted to be a nonresponder to Plavix    Current Outpatient Medications  Medication Sig Dispense Refill   allopurinol (ZYLOPRIM) 100 MG tablet Take 100 mg by mouth every morning.     amLODipine (NORVASC) 10 MG tablet Take 1 tablet (10 mg  total) by mouth daily. (Patient taking differently: Take 730 mg by mouth every morning.) 90 tablet 3   aspirin 81 MG tablet Take 81 mg by mouth every morning.     atorvastatin (LIPITOR) 80 MG tablet TAKE 1 TABLET BY MOUTH EVERY DAY (Patient taking differently: Take 80 mg by mouth at bedtime.) 90 tablet 3   carvedilol (COREG) 25 MG tablet TAKE 1 TABLET BY MOUTH TWO TIMES DAILY WITH A MEAL (Patient taking differently: Take 25 mg by mouth in the morning and at bedtime.) 180 tablet 3   chlorthalidone (HYGROTON) 25 MG tablet TAKE 1 TABLET BY MOUTH EVERY DAY 90 tablet 3   empagliflozin (JARDIANCE) 25  MG TABS tablet Take 25 mg by mouth daily.     fenofibrate (TRICOR) 145 MG tablet TAKE 1 TABLET BY MOUTH EVERY DAY 90 tablet 3   gabapentin (NEURONTIN) 100 MG capsule Take 100 mg by mouth 2 (two) times daily as needed (pain). (Patient not taking: Reported on 04/12/2021)     Insulin Glargine (TOUJEO MAX SOLOSTAR ) Inject into the skin daily.     isosorbide mononitrate (IMDUR) 120 MG 24 hr tablet Take 1 tablet (120 mg total) by mouth daily. 90 tablet 3   losartan (COZAAR) 100 MG tablet Take 100 mg by mouth every morning.      metFORMIN (GLUCOPHAGE-XR) 500 MG 24 hr tablet Take 500 mg by mouth 2 (two) times daily.     nitroGLYCERIN (NITROSTAT) 0.4 MG SL tablet Place 1 tablet (0.4 mg total) under the tongue every 5 (five) minutes as needed for chest pain. 25 tablet 11   omeprazole (PRILOSEC) 20 MG capsule Take 20 mg by mouth every morning.      oxyCODONE-acetaminophen (PERCOCET/ROXICET) 5-325 MG tablet Take 1 tablet by mouth every 6 (six) hours as needed for moderate pain. (Patient not taking: No sig reported) 30 tablet 0   SOLIQUA 100-33 UNT-MCG/ML SOPN Inject 60 Units into the skin daily. (Patient not taking: Reported on 04/12/2021)     No current facility-administered medications for this visit.    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   Social Determinants of Health   Financial Resource Strain: Not on file  Food Insecurity: Not on file  Transportation Needs: Not on file  Physical Activity: Not on file  Stress: Not on file  Social Connections: Not on file  Intimate Partner Violence: Not on file     REVIEW OF SYSTEMS:   [X]  denotes positive finding, [ ]  denotes negative finding Cardiac  Comments:  Chest pain or chest pressure:    Shortness of breath upon exertion:    Short of breath when lying flat:    Irregular heart rhythm:        Vascular    Pain in calf, thigh, or hip brought on by ambulation: x   Pain in feet at night that wakes you up from your sleep:     Blood clot in your veins:    Leg swelling:  x       Pulmonary    Oxygen at home:    Productive cough:     Wheezing:         Neurologic    Sudden weakness in arms or  legs:     Sudden numbness in arms or legs:     Sudden onset of difficulty speaking or slurred speech:    Temporary loss of vision in one eye:     Problems with dizziness:         Gastrointestinal    Blood in stool:     Vomited blood:         Genitourinary    Burning when urinating:     Blood in urine:        Psychiatric    Major depression:         Hematologic    Bleeding problems:    Problems with blood  clotting too easily:        Skin    Rashes or ulcers:        Constitutional    Fever or chills:      PHYSICAL EXAMINATION:  Today's Vitals   02/20/22 1035  BP: (!) 141/76  Pulse: 83  Resp: 20  Temp: 98.2 F (36.8 C)  TempSrc: Temporal  SpO2: 95%  Weight: 197 lb 12.8 oz (89.7 kg)  Height: 5' 10.5" (1.791 m)   Body mass index is 27.98 kg/m.   General:  WDWN in NAD; vital signs documented above Gait: Not observed HENT: WNL, normocephalic Pulmonary: normal non-labored breathing , without wheezing Cardiac: regular HR, without carotid bruits Abdomen: soft, NT; aortic pulse is not palpable Skin: without rashes Vascular Exam/Pulses:  Right Left  Radial 2+ (normal) 2+ (normal)  Femoral trace 2+ (normal)  Popliteal Unable to palpate Unable to palpate  DP Brisk monophasic monophasic  PT monophasic monophasic  Peroneal monophasic monophasic   Extremities: without ischemic changes, without Gangrene , without cellulitis; without open wounds Musculoskeletal: no muscle wasting or atrophy  Neurologic: A&O X 3 Psychiatric:  The pt has Normal affect.   Non-Invasive Vascular Imaging:   ABI's/TBI's on 02/20/2022: Right:  0.65 - Great toe pressure: absent Left:  [place] - Great toe pressure: 37  Arterial duplex on 02/20/2022: Left Graft #1: Femoral to BK popliteal  +--------------------+--------+---------------+----------+--------+                      PSV cm/sStenosis       Waveform  Comments  +--------------------+--------+---------------+----------+--------+  Inflow              94                     biphasic            +--------------------+--------+---------------+----------+--------+  Proximal Anastomosis70                     monophasic          +--------------------+--------+---------------+----------+--------+  Proximal Graft      103                    monophasic          +--------------------+--------+---------------+----------+--------+  Mid  Graft           121                    monophasic          +--------------------+--------+---------------+----------+--------+  Distal Graft        66                     monophasic          +--------------------+--------+---------------+----------+--------+  Distal Anastomosis  80                     monophasic          +--------------------+--------+---------------+----------+--------+  Outflow             217     50-74% stenosismonophasic          +--------------------+--------+---------------+----------+--------+    Summary:  Left: Patent femoral to below-knee popliteal bypass graft with no obvious evidence stenosis. 50-74% stenosis in the outflow artery. Essentially unchanged from the prior study 02/14/2021.  Technically limited exam.   Previous ABI's/TBI's on 02/14/2021: Right:  0.45/0.19 - Great toe pressure: 26 Left:  1.82/0.22 - Great toe pressure:  31  Previous arterial duplex on 02/14/2021: Left: Graft appears patent with no obvious evidence of stenosis. 50-74%  stenosis in the outflow artery.    ASSESSMENT/PLAN:: 76 y.o. male here for follow up for PAD with hx of left CFA to below knee popliteal artery bypass with ipsilateral non reversed GSV on 07/27/2020 by Dr. Chestine Spore for CLI of the LLE with rest pain.    -pt doing well without claudication, rest pain or non healing wounds.  His LLE arterial duplex reveals a 50-74% outflow stenosis that is essentially unchanged from one year ago.  His left toe pressures slightly improved.  He is more limited by bilateral hip pain that his legs for walking.  Encouraged him to walk as much as he can and protect his feet.   -he continues to have LLE swelling post bypass-encouraged him to elevate his legs when he can to help with swelling.  -continue asa/statin -pt will f/u in one year with LLE arterial duplex and ABI.  He knows to call sooner if he has any non healing wounds or develops rest pain.   Doreatha Massed,  [facility] [phone]  Clinic MD:   Chestine Spore

## 2022-02-20 ENCOUNTER — Ambulatory Visit (INDEPENDENT_AMBULATORY_CARE_PROVIDER_SITE_OTHER): Payer: Medicare Other | Admitting: Physician Assistant

## 2022-02-20 ENCOUNTER — Ambulatory Visit (INDEPENDENT_AMBULATORY_CARE_PROVIDER_SITE_OTHER)
Admission: RE | Admit: 2022-02-20 | Discharge: 2022-02-20 | Disposition: A | Discharge status: Home / Self Care | Payer: Medicare Other | Source: Ambulatory Visit | Attending: Vascular Surgery | Admitting: Vascular Surgery

## 2022-02-20 ENCOUNTER — Ambulatory Visit (HOSPITAL_COMMUNITY)
Admission: RE | Admit: 2022-02-20 | Discharge: 2022-02-20 | Disposition: A | Discharge status: Home / Self Care | Payer: Medicare Other | Source: Ambulatory Visit | Attending: Vascular Surgery | Admitting: Vascular Surgery

## 2022-02-20 VITALS — BP 141/76 | HR 83 | Temp 98.2°F | Resp 20 | Ht 70.5 in | Wt 197.8 lb

## 2022-02-20 DIAGNOSIS — I739 Peripheral vascular disease, unspecified: Secondary | ICD-10-CM | POA: Insufficient documentation

## 2022-04-27 ENCOUNTER — Emergency Department (HOSPITAL_COMMUNITY): Payer: Medicare Other

## 2022-04-27 ENCOUNTER — Emergency Department (HOSPITAL_COMMUNITY)
Admission: EM | Admit: 2022-04-27 | Discharge: 2022-04-27 | Disposition: A | Discharge status: Home / Self Care | Payer: Medicare Other | Attending: Emergency Medicine | Admitting: Emergency Medicine

## 2022-04-27 ENCOUNTER — Other Ambulatory Visit: Payer: Self-pay

## 2022-04-27 ENCOUNTER — Encounter (HOSPITAL_COMMUNITY): Payer: Self-pay

## 2022-04-27 DIAGNOSIS — Z7982 Long term (current) use of aspirin: Secondary | ICD-10-CM | POA: Diagnosis not present

## 2022-04-27 DIAGNOSIS — J329 Chronic sinusitis, unspecified: Secondary | ICD-10-CM | POA: Diagnosis not present

## 2022-04-27 DIAGNOSIS — I10 Essential (primary) hypertension: Secondary | ICD-10-CM | POA: Diagnosis not present

## 2022-04-27 DIAGNOSIS — Z7984 Long term (current) use of oral hypoglycemic drugs: Secondary | ICD-10-CM | POA: Insufficient documentation

## 2022-04-27 DIAGNOSIS — B9689 Other specified bacterial agents as the cause of diseases classified elsewhere: Secondary | ICD-10-CM | POA: Insufficient documentation

## 2022-04-27 DIAGNOSIS — R519 Headache, unspecified: Secondary | ICD-10-CM | POA: Insufficient documentation

## 2022-04-27 DIAGNOSIS — Z79899 Other long term (current) drug therapy: Secondary | ICD-10-CM | POA: Insufficient documentation

## 2022-04-27 DIAGNOSIS — Z794 Long term (current) use of insulin: Secondary | ICD-10-CM | POA: Diagnosis not present

## 2022-04-27 DIAGNOSIS — R059 Cough, unspecified: Secondary | ICD-10-CM | POA: Diagnosis present

## 2022-04-27 DIAGNOSIS — I2510 Atherosclerotic heart disease of native coronary artery without angina pectoris: Secondary | ICD-10-CM | POA: Insufficient documentation

## 2022-04-27 DIAGNOSIS — E119 Type 2 diabetes mellitus without complications: Secondary | ICD-10-CM | POA: Diagnosis not present

## 2022-04-27 DIAGNOSIS — Z87891 Personal history of nicotine dependence: Secondary | ICD-10-CM | POA: Diagnosis not present

## 2022-04-27 LAB — CBC WITH DIFFERENTIAL/PLATELET
Abs Immature Granulocytes: 0.07 10*3/uL (ref 0.00–0.07)
Basophils Absolute: 0.1 10*3/uL (ref 0.0–0.1)
Basophils Relative: 0 %
Eosinophils Absolute: 0 10*3/uL (ref 0.0–0.5)
Eosinophils Relative: 0 %
HCT: 42.4 % (ref 39.0–52.0)
Hemoglobin: 14.1 g/dL (ref 13.0–17.0)
Immature Granulocytes: 1 %
Lymphocytes Relative: 34 %
Lymphs Abs: 5.3 10*3/uL — ABNORMAL HIGH (ref 0.7–4.0)
MCH: 28.5 pg (ref 26.0–34.0)
MCHC: 33.3 g/dL (ref 30.0–36.0)
MCV: 85.7 fL (ref 80.0–100.0)
Monocytes Absolute: 0.8 10*3/uL (ref 0.1–1.0)
Monocytes Relative: 5 %
Neutro Abs: 9.3 10*3/uL — ABNORMAL HIGH (ref 1.7–7.7)
Neutrophils Relative %: 60 %
Platelets: 203 10*3/uL (ref 150–400)
RBC: 4.95 MIL/uL (ref 4.22–5.81)
RDW: 13.8 % (ref 11.5–15.5)
WBC: 15.7 10*3/uL — ABNORMAL HIGH (ref 4.0–10.5)
nRBC: 0 % (ref 0.0–0.2)

## 2022-04-27 LAB — BASIC METABOLIC PANEL
Anion gap: 10 (ref 5–15)
BUN: 23 mg/dL (ref 8–23)
CO2: 27 mmol/L (ref 22–32)
Calcium: 8.7 mg/dL — ABNORMAL LOW (ref 8.9–10.3)
Chloride: 104 mmol/L (ref 98–111)
Creatinine, Ser: 1.03 mg/dL (ref 0.61–1.24)
GFR, Estimated: >60 mL/min (ref >60)
Glucose, Bld: 225 mg/dL — ABNORMAL HIGH (ref 70–99)
Potassium: 3 mmol/L — ABNORMAL LOW (ref 3.5–5.1)
Sodium: 141 mmol/L (ref 135–145)

## 2022-04-27 MED ORDER — DOXYCYCLINE HYCLATE 100 MG PO CAPS: 100.0000 mg | ORAL_CAPSULE | Freq: Two times a day (BID) | ORAL | 0 refills | Status: AC

## 2022-04-27 NOTE — ED Triage Notes (Signed)
Pt reports cough and congestion x 2 weeks.  Reports cough productive with yellow sputum.  Pt c/o headache in left temporal area yesterday and today face and jaws feel "sore."

## 2022-04-27 NOTE — Discharge Instructions (Addendum)
Your CT scan shows sinusitis. Please return if your symptoms worsen or you develop any worsening symptoms.

## 2022-04-27 NOTE — ED Provider Notes (Signed)
[facility] EMERGENCY DEPARTMENT Provider Note  CSN: [id number] Arrival date & time: 04/27/22 1102  Chief Complaint(s) Cough  HPI [name] [name] is a 76 y.o. male with history of coronary artery disease, diabetes, hypertension, hyperlipidemia presenting to the emergency department with facial pain.  He reports 2 weeks of congestion, runny nose.  He reports associated cough with yellow sputum production.  He reports frontal headache.  Triage note reports left temporal area but when I asked the patient to point to the pain he pointed to his face.  He denies visual changes, numbness, tingling, eye pain.  Symptoms mild.   Past Medical History Past Medical History:  Diagnosis Date   CAP (community acquired pneumonia) 10/11/2017   Coronary artery disease    a. Cath: 2000 nonobs dz. b. 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 s/p BMS to LAD & RCA ; s/p PCI/DES dRCA & PDA x2 2011. d. cath 02/17/14: LM patent, LAD calcified, diffuse irregs, no high grade stenosis mLAD stent patent, LCx, diffuse irregs no high-grade dz, RCA total occ w/ L to R collats, medical Rx rec   Diabetes mellitus    Diastolic dysfunction    a. echo 02/18/14: EF 45-50%, mild LVH, AK of inferior and inferoseptal myocardium, GR1DD   Hiatal hernia    Hyperlipidemia    Hypertension    LV dysfunction    NSTEMI (non-ST elevated myocardial infarction) (HCC) 10/07/2017   PVD (peripheral vascular disease) (HCC)    ABIs of 0.69 on right and 0.93 on left   Renal artery stenosis (HCC)    Mild, bilateral   S/P angioplasty with stent, atherectomy DES to pLAD and DES to mLAD 10/10/17  10/11/2017   Sleep apnea    Mild   Patient Active Problem List   Diagnosis Date Noted   PAD (peripheral artery disease) (HCC) 07/27/2020   Educated about COVID-19 virus infection 08/10/2019   Elevated troponin level not due to acute coronary syndrome 07/21/2018   S/P angioplasty with stent, atherectomy DES to pLAD and DES to  mLAD 10/10/17  10/11/2017   CAP (community acquired pneumonia) 10/11/2017   Acute cardiogenic pulmonary edema (HCC) 10/09/2017   Coronary artery disease involving native coronary artery of native heart with angina pectoris (HCC)    Flash pulmonary edema (HCC)    Acute hypoxemic respiratory failure (HCC) 10/07/2017   Pulmonary edema 10/07/2017   Acute maxillary sinusitis 05/14/2017   Flu-like symptoms 05/14/2017   Essential hypertension 04/02/2017   Dyslipidemia 04/02/2017   Bruit 04/02/2017   Gastroesophageal reflux disease 01/30/2016   Intermediate coronary syndrome (HCC) 02/16/2014   NSTEMI (non-ST elevated myocardial infarction) (HCC) 02/16/2014   PVD 04/26/2010   Type 2 diabetes mellitus without complication (HCC) 04/25/2010   Mixed hyperlipidemia 04/25/2010   Coronary atherosclerosis 04/25/2010   RENAL ARTERY STENOSIS 04/25/2010   SLEEP APNEA 04/25/2010   Home Medication(s) Prior to Admission medications   Medication Sig Start Date End Date Taking? Authorizing Provider  doxycycline (VIBRAMYCIN) 100 MG capsule Take 1 capsule (100 mg total) by mouth 2 (two) times daily for 10 days. 04/27/22 05/07/22 Yes Lonell Grandchild, MD  allopurinol (ZYLOPRIM) 100 MG tablet Take 100 mg by mouth every morning.    [provider]  amLODipine (NORVASC) 10 MG tablet Take 1 tablet (10 mg total) by mouth daily. Patient taking differently: Take 730 mg by mouth every morning. 09/24/12   Rollene Rotunda, MD  aspirin 81 MG tablet Take 81 mg by  mouth every morning.    [provider]  atorvastatin (LIPITOR) 80 MG tablet TAKE 1 TABLET BY MOUTH EVERY DAY Patient taking differently: Take 80 mg by mouth at bedtime. 08/12/19   Iran Ouch, MD  carvedilol (COREG) 25 MG tablet TAKE 1 TABLET BY MOUTH TWO TIMES DAILY WITH A MEAL Patient taking differently: Take 25 mg by mouth in the morning and at bedtime. 08/12/19   Iran Ouch, MD  chlorthalidone (HYGROTON) 25 MG tablet TAKE 1  TABLET BY MOUTH EVERY DAY 05/29/21   Rollene Rotunda, MD  empagliflozin (JARDIANCE) 25 MG TABS tablet Take 25 mg by mouth daily.    [provider]  fenofibrate (TRICOR) 145 MG tablet TAKE 1 TABLET BY MOUTH EVERY DAY 07/19/21   Rollene Rotunda, MD  gabapentin (NEURONTIN) 100 MG capsule Take 100 mg by mouth 2 (two) times daily as needed (pain).    [provider]  Insulin Glargine (TOUJEO MAX SOLOSTAR [place]) Inject into the skin daily.    [provider]  isosorbide mononitrate (IMDUR) 120 MG 24 hr tablet Take 1 tablet (120 mg total) by mouth daily. 11/20/21   Rollene Rotunda, MD  losartan (COZAAR) 100 MG tablet Take 100 mg by mouth every morning.     [provider]  metFORMIN (GLUCOPHAGE-XR) 500 MG 24 hr tablet Take 500 mg by mouth 2 (two) times daily.    [provider]  nitroGLYCERIN (NITROSTAT) 0.4 MG SL tablet Place 1 tablet (0.4 mg total) under the tongue every 5 (five) minutes as needed for chest pain. 08/27/18   Rollene Rotunda, MD  omeprazole (PRILOSEC) 20 MG capsule Take 20 mg by mouth every morning.     [provider]  oxyCODONE-acetaminophen (PERCOCET/ROXICET) 5-325 MG tablet Take 1 tablet by mouth every 6 (six) hours as needed for moderate pain. 07/29/20   Emilie Rutter, PA-C  SOLIQUA 100-33 UNT-MCG/ML SOPN Inject 60 Units into the skin daily. 03/24/20   [provider]                                                                                                                                    Past Surgical History Past Surgical History:  Procedure Laterality Date   ABDOMINAL AORTOGRAM W/LOWER EXTREMITY N/A 07/21/2020   Procedure: ABDOMINAL AORTOGRAM W/LOWER EXTREMITY;  Surgeon: [name], MD;  Location: [facility] INVASIVE CV LAB;  Service: Cardiovascular;  Laterality: N/A;   CATARACT EXTRACTION     CORONARY ATHERECTOMY N/A 10/10/2017   Procedure: CORONARY ATHERECTOMY - CSI;  Surgeon: Yvonne Kendall, MD;  Location: [facility]  INVASIVE CV LAB;  Service: Cardiovascular;  Laterality: N/A;   CORONARY STENT INTERVENTION N/A 10/10/2017   Procedure: CORONARY STENT INTERVENTION;  Surgeon: Yvonne Kendall, MD;  Location: [facility] INVASIVE CV LAB;  Service: Cardiovascular;  Laterality: N/A;   CORONARY STENT INTERVENTION N/A 07/21/2018   Procedure: CORONARY STENT INTERVENTION;  Surgeon: Yvonne Kendall, MD;  Location:  [facility] INVASIVE CV LAB;  Service: Cardiovascular;  Laterality: N/A;   FEMORAL-POPLITEAL BYPASS GRAFT Left 07/27/2020   Procedure: LEFT FEMORAL TO BELOW KNEE POPLITEAL ARTERY BYPASS;  Surgeon: [name], MD;  Location: [facility] OR;  Service: Vascular;  Laterality: Left;   HIATAL HERNIA REPAIR     INTRAVASCULAR ULTRASOUND/IVUS N/A 10/09/2017   Procedure: Intravascular Ultrasound/IVUS;  Surgeon: [name], MD;  Location: [facility] INVASIVE CV LAB;  Service: Cardiovascular;  Laterality: N/A;   INTRAVASCULAR ULTRASOUND/IVUS N/A 07/21/2018   Procedure: Intravascular Ultrasound/IVUS;  Surgeon: Yvonne Kendall, MD;  Location: [facility] INVASIVE CV LAB;  Service: Cardiovascular;  Laterality: N/A;   LEFT HEART CATH AND CORONARY ANGIOGRAPHY N/A 10/09/2017   Procedure: LEFT HEART CATH AND CORONARY ANGIOGRAPHY;  Surgeon: [name], MD;  Location: [facility] INVASIVE CV LAB;  Service: Cardiovascular;  Laterality: N/A;   LEFT HEART CATH AND CORONARY ANGIOGRAPHY N/A 07/21/2018   Procedure: LEFT HEART CATH AND CORONARY ANGIOGRAPHY;  Surgeon: Yvonne Kendall, MD;  Location: [facility] INVASIVE CV LAB;  Service: Cardiovascular;  Laterality: N/A;   LEFT HEART CATHETERIZATION WITH CORONARY ANGIOGRAM N/A 02/17/2014   Procedure: LEFT HEART CATHETERIZATION WITH CORONARY ANGIOGRAM;  Surgeon: Micheline Chapman, MD;  Location: [facility] CATH LAB;  Service: Cardiovascular;  Laterality: N/A;   SHOULDER ARTHROSCOPY     Right   SOFT TISSUE TUMOR RESECTION     Vocal cord tumor, abdominal   SOFT TISSUE TUMOR RESECTION     Benign neck tumor   VEIN HARVEST Left 07/27/2020    Procedure: VEIN HARVEST USING  GREATER SAPHENOUS VEIN;  Surgeon: [name], MD;  Location: [facility] OR;  Service: Vascular;  Laterality: Left;   Vocal cord polyp removal     Family History Family History  Problem Relation Age of Onset   Heart attack Father 62   Cancer Mother    Cancer Brother     Social History Social History   Tobacco Use   Smoking status: Former    Types: Cigarettes    Quit date: 06/19/2003    Years since quitting: 18.8    Passive exposure: Never   Smokeless tobacco: Never  Vaping Use   Vaping Use: Never used  Substance Use Topics   Alcohol use: No   Drug use: No   Allergies Bee venom, Other, Penicillin g, Penicillins, and Plavix [clopidogrel bisulfate]  Review of Systems Review of Systems  All other systems reviewed and are negative.   Physical Exam Vital Signs  I have reviewed the triage vital signs BP (!) 149/65 (BP Location: Left Arm)   Pulse 86   Temp 98.2 F (36.8 C) (Oral)   Ht 5\' 10"  (1.778 m)   Wt 89.8 kg   SpO2 97%   BMI 28.41 kg/m  Physical Exam Vitals and nursing note reviewed.  Constitutional:      General: He is not in acute distress.    Appearance: Normal appearance.  HENT:     Head:     Comments: Lateral maxillary tenderness to palpation.  No temporal tenderness to palpation. Nares patent    Mouth/Throat:     Mouth: Mucous membranes are moist.  Eyes:     Extraocular Movements: Extraocular movements intact.     Conjunctiva/sclera: Conjunctivae normal.     Pupils: Pupils are equal, round, and reactive to light.  Cardiovascular:     Rate and Rhythm: Normal rate and regular rhythm.  Pulmonary:     Effort: Pulmonary effort is normal. No respiratory distress.  Breath sounds: Normal breath sounds.  Abdominal:     General: Abdomen is flat.     Palpations: Abdomen is soft.     Tenderness: There is no abdominal tenderness.  Musculoskeletal:     Right lower leg: No edema.     Left lower leg: No edema.  Skin:     General: Skin is warm and dry.     Capillary Refill: Capillary refill takes less than 2 seconds.  Neurological:     Mental Status: He is alert and oriented to person, place, and time. Mental status is at baseline.  Psychiatric:        Mood and Affect: Mood normal.        Behavior: Behavior normal.     ED Results and Treatments Labs (all labs ordered are listed, but only abnormal results are displayed) Labs Reviewed  BASIC METABOLIC PANEL - Abnormal; Notable for the following components:      Result Value   Potassium 3.0 (*)    Glucose, Bld 225 (*)    Calcium 8.7 (*)    All other components within normal limits  CBC WITH DIFFERENTIAL/PLATELET - Abnormal; Notable for the following components:   WBC 15.7 (*)    Neutro Abs 9.3 (*)    Lymphs Abs 5.3 (*)    All other components within normal limits                                                                                                                          Radiology CT Head Wo Contrast  Result Date: 04/27/2022 CLINICAL DATA:  Headaches EXAM: CT HEAD WITHOUT CONTRAST TECHNIQUE: Contiguous axial images were obtained from the base of the skull through the vertex without intravenous contrast. RADIATION DOSE REDUCTION: This exam was performed according to the departmental dose-optimization program which includes automated exposure control, adjustment of the mA and/or kV according to patient size and/or use of iterative reconstruction technique. COMPARISON:  None Available. FINDINGS: Brain: No acute intracranial findings are seen in noncontrast CT brain. There are no signs of bleeding within the cranium. Cortical sulci are prominent. There is small old lacunar infarct in left basal ganglia. Vascular: Unremarkable. Skull: No focal abnormalities are seen in the calvarium. Sinuses/Orbits: Air-fluid level is seen in left maxillary sinus. There is mucosal thickening in the ethmoid and both maxillary sinuses. There is deviation of nasal  septum to the left. Other: None. IMPRESSION: No acute intracranial findings are seen in noncontrast CT brain. Atrophy. Small old lacunar infarct is seen in the left basal ganglia. Chronic ethmoid and maxillary sinusitis. Possible acute left maxillary sinusitis. Electronically Signed   By: Ernie Avena M.D.   On: 04/27/2022 13:54   DG Chest Port 1 View  Result Date: 04/27/2022 CLINICAL DATA:  Cough EXAM: PORTABLE CHEST 1 VIEW COMPARISON:  Chest x-ray July 20, 2018. FINDINGS: Mild coarsened lung markings. No consolidation. No visible pleural effusions or pneumothorax. Cardiomediastinal silhouette is unchanged and within normal limits. IMPRESSION:  No active disease.  Chronic mild coarsened lung markings. Electronically Signed   By: Feliberto Harts M.D.   On: 04/27/2022 12:19    Pertinent labs & imaging results that were available during my care of the patient were reviewed by me and considered in my medical decision making (see MDM for details).  Medications Ordered in ED Medications - No data to display                                                                                                                                   Procedures Procedures  (including critical care time)  Medical Decision Making / ED Course   MDM:  76 year old male presenting with facial pain.  Suspect sinusitis given symptoms.  Low concern for temporal arteritis or other dangerous causes of headache such as intracranial mass, intracranial bleeding, carbon monoxide poisoning.  Doubt pneumonia, chest x-ray clear, suspect cough is related to postnasal drip from likely sinusitis.  On my interpretation of CT scan patient does have air-fluid levels in his bilateral maxillary sinuses which would correlate with his symptoms.  He has an elevated WBC count suggestive of bacterial sinusitis.  His symptoms are slowly progressive, doubt invasive fungal sinusitis e.g. Mucor, no evidence of this on direct nares  exam.Will await final CT read, if no other abnormalities noted likely discharge  Clinical Course as of 04/27/22 1614  Fri Apr 27, 2022  1613 CT scan negative for acute intracranial process.  Does demonstrate acute sinusitis which likely explains the patient's symptoms.  Will start on doxycycline as he is allergic to penicillins. Will discharge patient to home. All questions answered. Patient comfortable with plan of discharge. Return precautions discussed with patient and specified on the after visit summary.  [WS]    Clinical Course User Index [WS] Lonell Grandchild, MD     Additional history obtained: -External records from outside source obtained and reviewed including: Chart review including previous notes, labs, imaging, consultation notes including vascular note 03/13/22   Lab Tests: -I ordered, reviewed, and interpreted labs.   The pertinent results include:   Labs Reviewed  BASIC METABOLIC PANEL - Abnormal; Notable for the following components:      Result Value   Potassium 3.0 (*)    Glucose, Bld 225 (*)    Calcium 8.7 (*)    All other components within normal limits  CBC WITH DIFFERENTIAL/PLATELET - Abnormal; Notable for the following components:   WBC 15.7 (*)    Neutro Abs 9.3 (*)    Lymphs Abs 5.3 (*)    All other components within normal limits    Notable for leukocytosis, hyperglycemia, hypokalemia  EKG   EKG Interpretation  Date/Time:  Friday April 27 2022 12:12:15 EST Ventricular Rate:  84 PR Interval:  167 QRS Duration: 105 QT Interval:  356 QTC Calculation: 421 R Axis:   56 Text Interpretation: Sinus rhythm Repol abnrm suggests ischemia, diffuse leads  Baseline wander in lead(s) V3 No significant change since last tracing Confirmed by Alvino Blood ([id number]) on 04/27/2022 12:45:40 PM         Imaging Studies ordered: I ordered imaging studies including CT head On my interpretation imaging demonstrates bacterial sinusitis I independently  visualized and interpreted imaging. I agree with the radiologist interpretation   Medicines ordered and prescription drug management: Meds ordered this encounter  Medications   doxycycline (VIBRAMYCIN) 100 MG capsule    Sig: Take 1 capsule (100 mg total) by mouth 2 (two) times daily for 10 days.    Dispense:  20 capsule    Refill:  0    -I have reviewed the patients home medicines and have made adjustments as needed   Social Determinants of Health:  Diagnosis or treatment significantly limited by social determinants of health: former smoker   Reevaluation: After the interventions noted above, I reevaluated the patient and found that they have improved  Co morbidities that complicate the patient evaluation  Past Medical History:  Diagnosis Date   CAP (community acquired pneumonia) 10/11/2017   Coronary artery disease    a. Cath: 2000 nonobs dz. b. 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 s/p BMS to LAD & RCA ; s/p PCI/DES dRCA & PDA x2 2011. d. cath 02/17/14: LM patent, LAD calcified, diffuse irregs, no high grade stenosis mLAD stent patent, LCx, diffuse irregs no high-grade dz, RCA total occ w/ L to R collats, medical Rx rec   Diabetes mellitus    Diastolic dysfunction    a. echo 02/18/14: EF 45-50%, mild LVH, AK of inferior and inferoseptal myocardium, GR1DD   Hiatal hernia    Hyperlipidemia    Hypertension    LV dysfunction    NSTEMI (non-ST elevated myocardial infarction) (HCC) 10/07/2017   PVD (peripheral vascular disease) (HCC)    ABIs of 0.69 on right and 0.93 on left   Renal artery stenosis (HCC)    Mild, bilateral   S/P angioplasty with stent, atherectomy DES to pLAD and DES to mLAD 10/10/17  10/11/2017   Sleep apnea    Mild      Dispostion: Disposition decision including need for hospitalization was considered, and patient discharged from emergency department.    Final Clinical Impression(s) / ED Diagnoses Final diagnoses:   Bacterial sinusitis     This chart was dictated using voice recognition software.  Despite best efforts to proofread,  errors can occur which can change the documentation meaning.    Lonell Grandchild, MD 04/27/22 [phone]

## 2022-05-09 ENCOUNTER — Ambulatory Visit (INDEPENDENT_AMBULATORY_CARE_PROVIDER_SITE_OTHER): Payer: Medicare Other | Admitting: Cardiology

## 2022-05-09 ENCOUNTER — Encounter: Payer: Self-pay | Admitting: Cardiology

## 2022-05-09 VITALS — BP 118/62 | HR 84 | Ht 70.0 in | Wt 197.0 lb

## 2022-05-09 DIAGNOSIS — I2510 Atherosclerotic heart disease of native coronary artery without angina pectoris: Secondary | ICD-10-CM | POA: Diagnosis not present

## 2022-05-09 DIAGNOSIS — E785 Hyperlipidemia, unspecified: Secondary | ICD-10-CM | POA: Diagnosis not present

## 2022-05-09 DIAGNOSIS — I739 Peripheral vascular disease, unspecified: Secondary | ICD-10-CM

## 2022-05-09 DIAGNOSIS — E118 Type 2 diabetes mellitus with unspecified complications: Secondary | ICD-10-CM | POA: Diagnosis not present

## 2022-05-09 DIAGNOSIS — I10 Essential (primary) hypertension: Secondary | ICD-10-CM | POA: Diagnosis not present

## 2022-05-09 MED ORDER — NITROGLYCERIN 0.4 MG SL SUBL: 0.4000 mg | SUBLINGUAL_TABLET | SUBLINGUAL | 11 refills | Status: DC | PRN

## 2022-05-09 NOTE — Patient Instructions (Signed)
Medication Instructions:  The current medical regimen is effective;  continue present plan and medications.  *If you need a refill on your cardiac medications before your next appointment, please call your pharmacy*  Follow-Up: At [facility], you and your health needs are our priority.  As part of our continuing mission to provide you with exceptional heart care, we have created designated Provider Care Teams.  These Care Teams include your primary Cardiologist (physician) and Advanced Practice Providers (APPs -  Physician Assistants and Nurse Practitioners) who all work together to provide you with the care you need, when you need it.  We recommend signing up for the patient portal called "MyChart".  Sign up information is provided on this After Visit Summary.  MyChart is used to connect with patients for Virtual Visits (Telemedicine).  Patients are able to view lab/test results, encounter notes, upcoming appointments, etc.  Non-urgent messages can be sent to your provider as well.   To learn more about what you can do with MyChart, go to [email or web address].    Your next appointment:   1 year(s)  The format for your next appointment:   In Person  Provider:   James Hochrein, MD     Important Information About Sugar       

## 2022-05-09 NOTE — Progress Notes (Signed)
Cardiology Office Note   Date:  05/09/2022   ID:  [name] [name], DOB 1946-02-12, MRN [id number]  PCP:  Rebekah Chesterfield, NP  Cardiologist:   Rollene Rotunda, MD   Chief Complaint  Patient presents with   Coronary Artery Disease     History of Present Illness: [name] [name] is a 76 y.o. male who presents for for follow up of his CAD.  He was admitted Sept 2015 with non-ST elevation MI. He was found to have an occluded right coronary artery but patent LAD stents. He was managed medically.   He was in the hospital in April 2019.  He had pneumonia.  He had C. difficile.  He also had non-Q wave MI with disease as listed below and ended up with staged PCI.  Ejection fraction was 45% which is essentially unchanged from previous.  He saw Dr. Kirke Corin with leg pain and PVD.    Feb 2020 he did have increasing shortness of breath and some chest discomfort.  He was diagnosed with NSTEMI.   He underwent PCI as below by Dr. Okey Dupre at that time. He has had lower extremity bypass.    Since I last saw him he has had no new cardiovascular complaints.  I do see that he saw [facility] and he had no new findings on ABIs recently.  He has bilateral hip pain but it is not clear that this is vascular versus orthopedic.  He does have some chronic left lower extremity swelling which is unchanged.  He is not having any chest pressure, neck or arm discomfort.  Is not having any new shortness of breath, PND or orthopnea.  He is helping to raise to great-grandchildren with their mom in the house.   Past Medical History:  Diagnosis Date   CAP (community acquired pneumonia) 10/11/2017   Coronary artery disease    a. Cath: 2000 nonobs dz. b. 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 s/p BMS to LAD & RCA ; s/p PCI/DES dRCA & PDA x2 2011. d. cath 02/17/14: LM patent, LAD calcified, diffuse irregs, no high grade stenosis mLAD stent patent, LCx, diffuse irregs no high-grade dz, RCA total occ w/ L to R  collats, medical Rx rec   Diabetes mellitus    Diastolic dysfunction    a. echo 02/18/14: EF 45-50%, mild LVH, AK of inferior and inferoseptal myocardium, [id number]   Hiatal hernia    Hyperlipidemia    Hypertension    LV dysfunction    NSTEMI (non-ST elevated myocardial infarction) (HCC) 10/07/2017   PVD (peripheral vascular disease) (HCC)    Renal artery stenosis (HCC)    Mild, bilateral   S/P angioplasty with stent, atherectomy DES to pLAD and DES to mLAD 10/10/17  10/11/2017   Sleep apnea    Mild    Past Surgical History:  Procedure Laterality Date   ABDOMINAL AORTOGRAM W/LOWER EXTREMITY N/A 07/21/2020   Procedure: ABDOMINAL AORTOGRAM W/LOWER EXTREMITY;  Surgeon: [name], MD;  Location: [facility];  Service: Cardiovascular;  Laterality: N/A;   CATARACT EXTRACTION     CORONARY ATHERECTOMY N/A 10/10/2017   Procedure: CORONARY ATHERECTOMY - CSI;  Surgeon: Yvonne Kendall, MD;  Location: [facility];  Service: Cardiovascular;  Laterality: N/A;   CORONARY STENT INTERVENTION N/A 10/10/2017   Procedure: CORONARY STENT INTERVENTION;  Surgeon: Yvonne Kendall, MD;  Location: [facility];  Service: Cardiovascular;  Laterality: N/A;   CORONARY STENT INTERVENTION  N/A 07/21/2018   Procedure: CORONARY STENT INTERVENTION;  Surgeon: Yvonne Kendall, MD;  Location: [facility];  Service: Cardiovascular;  Laterality: N/A;   FEMORAL-POPLITEAL BYPASS GRAFT Left 07/27/2020   Procedure: LEFT FEMORAL TO BELOW KNEE POPLITEAL ARTERY BYPASS;  Surgeon: [name], MD;  Location: [facility] OR;  Service: Vascular;  Laterality: Left;   HIATAL HERNIA REPAIR     INTRAVASCULAR ULTRASOUND/IVUS N/A 10/09/2017   Procedure: Intravascular Ultrasound/IVUS;  Surgeon: [name], MD;  Location: [facility] INV[facility] CV [facility];  Service: Cardiovascular;  Laterality: N/A;   INTRAVASCULAR ULTRASOUND/IVUS N/A 07/21/2018   Procedure: Intravascular Ultrasound/IVUS;  Surgeon: Yvonne Kendall, MD;   Location: [facility];  Service: Cardiovascular;  Laterality: N/A;   LEFT HEART CATH AND CORONARY ANGIOGRAPHY N/A 10/09/2017   Procedure: LEFT HEART CATH AND CORONARY ANGIOGRAPHY;  Surgeon: [name], MD;  Location[facility] INVASIVE CV LAB;  Service: Cardiovascular;  Laterality: N/A;   LEFT HEART CATH AND CORONARY ANGIOGRAPHY N/A 07/21/2018   Procedure: LEFT HEART CATH AND CORONARY ANGIOGRAPHY;  Surgeon: Yvonne Kendall, MD;  Location: [facility];  Service: Cardiovascular;  Laterality: N/A;   LEFT HEART CATHETERIZATION WITH CORONARY ANGIOGRAM N/A 02/17/2014   Procedure: LEFT HEART CATHETERIZATION WITH CORONARY ANGIOGRAM;  Surgeon: Micheline Chapman, MD;  Location: [facility] CATH LAB;  Service: Cardiovascular;  Laterality: N/A;   SHOULDER ARTHROSCOPY     Right   SOFT TISSUE TUMOR RESECTION     Vocal cord tumor, abdominal   SOFT TISSUE TUMOR RESECTION     Benign neck tumor   VEIN HARVEST Left 07/27/2020   Procedure: VEIN HARVEST USING  GREATER SAPHENOUS VEIN;  Surgeon: [name], MD;  Location: [facility] OR;  Service: Vascular;  Laterality: Left;   Vocal cord polyp removal       Current Outpatient Medications  Medication Sig Dispense Refill   allopurinol (ZYLOPRIM) 100 MG tablet Take 100 mg by mouth every morning.     amLODipine (NORVASC) 10 MG tablet Take 1 tablet (10 mg total) by mouth daily. (Patient taking differently: Take 730 mg by mouth every morning.) 90 tablet 3   aspirin 81 MG tablet Take 81 mg by mouth every morning.     atorvastatin (LIPITOR) 80 MG tablet TAKE 1 TABLET BY MOUTH EVERY DAY (Patient taking differently: Take 80 mg by mouth at bedtime.) 90 tablet 3   carvedilol (COREG) 25 MG tablet TAKE 1 TABLET BY MOUTH TWO TIMES DAILY WITH A MEAL (Patient taking differently: Take 25 mg by mouth in the morning and at bedtime.) 180 tablet 3   chlorthalidone (HYGROTON) 25 MG tablet TAKE 1 TABLET BY MOUTH EVERY DAY 90 tablet 3   empagliflozin (JARDIANCE) 25 MG TABS tablet Take 25  mg by mouth daily.     fenofibrate (TRICOR) 145 MG tablet TAKE 1 TABLET BY MOUTH EVERY DAY 90 tablet 3   gabapentin (NEURONTIN) 100 MG capsule Take 100 mg by mouth 2 (two) times daily as needed (pain).     isosorbide mononitrate (IMDUR) 120 MG 24 hr tablet Take 1 tablet (120 mg total) by mouth daily. 90 tablet 3   losartan (COZAAR) 100 MG tablet Take 100 mg by mouth every morning.      metFORMIN (GLUCOPHAGE-XR) 500 MG 24 hr tablet Take 500 mg by mouth 2 (two) times daily.     omeprazole (PRILOSEC) 20 MG capsule Take 20 mg by mouth every morning.      oxyCODONE-acetaminophen (PERCOCET/ROXICET) 5-325 MG tablet Take 1  tablet by mouth every 6 (six) hours as needed for moderate pain. 30 tablet 0   TOUJEO MAX SOLOSTAR 300 UNIT/ML Solostar Pen Inject 80 Units into the skin daily.     nitroGLYCERIN (NITROSTAT) 0.4 MG SL tablet Place 1 tablet (0.4 mg total) under the tongue every 5 (five) minutes as needed for chest pain. 25 tablet 11   No current facility-administered medications for this visit.    Allergies:   Bee venom, Other, Penicillin g, Penicillins, and Plavix [clopidogrel bisulfate]    ROS:  Please see the history of present illness.   Otherwise, review of systems are positive for none.   All other systems are reviewed and negative.    PHYSICAL EXAM: VS:  BP 118/62   Pulse 84   Ht 5\' 10"  (1.778 m)   Wt 197 lb (89.4 kg)   SpO2 94%   BMI 28.27 kg/m  , BMI Body mass index is 28.27 kg/m. GENERAL:  Well appearing NECK:  No jugular venous distention, waveform within normal limits, carotid upstroke brisk and symmetric, no bruits, no thyromegaly LUNGS:  Clear to auscultation bilaterally CHEST:  Unremarkable HEART:  PMI not displaced or sustained,S1 and S2 within normal limits, no S3, no S4, no clicks, no rubs, no murmurs ABD:  Flat, positive bowel sounds normal in frequency in pitch, no bruits, no rebound, no guarding, no midline pulsatile mass, no hepatomegaly, no splenomegaly EXT:  2 plus  pulses upper and decreased dorsalis pedis posttibial bilateral, no edema, no cyanosis no clubbing, chronic lower left edema   EKG:  EKG is not   ordered today. The ekg ordered 04/27/2022 demonstrates sinus rhythm, rate 84, axis within normal limits, intervals within normal limits, premature ventricular contractions, nonspecific T wave changes, nonspecific ST-T wave changes less pronounced than previous   Recent Labs: 04/27/2022: BUN 23; Creatinine, Ser 1.03; Hemoglobin 14.1; Platelets 203; Potassium 3.0; Sodium 141    Lipid Panel    Component Value Date/Time   CHOL 70 07/28/2020 0123   TRIG 219 (H) 07/28/2020 0123   HDL 17 (L) 07/28/2020 0123   CHOLHDL 4.1 07/28/2020 0123   VLDL 44 (H) 07/28/2020 0123   LDLCALC 9 07/28/2020 0123      Wt Readings from Last 3 Encounters:  05/09/22 197 lb (89.4 kg)  04/27/22 198 lb (89.8 kg)  02/20/22 197 lb 12.8 oz (89.7 kg)    Cardiac cath 07/21/18:   Diagnostic  Dominance: Right    Intervention       Conclusions: Three-vessel coronary artery disease, including 60-70% ostial LAD (MLA 3.3 cm^2 by IVUS), 90% stenosis at distal margin of old mid/distal LAD stent, 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 Successful PCI to mid/distal LAD using Resolute Onyx 2.0 x 18 mm DES (post-dilated to 2.6 mm). 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    Other studies Reviewed: Additional studies/ records that were reviewed today include: [facility] records Review of the above records demonstrates: See elsewhere   ASSESSMENT AND PLAN:  CAD:   The patient has no new sypmtoms.  No further cardiovascular testing is indicated.  We will continue with aggressive risk reduction and meds as listed.   HTN:   The blood pressure is at target.  No change in therapy.    DM:   A1c is 8.3 and he is working with his primary provider.   PVD:  He  has yearly follow-up with [facility].  No change in therapy.   DYSLIPIDEMIA:   I do not have his most recent labs.  I asked him to get this checked with his primary provider next week when he sees him.  I also mentioned that ER records recently (when he presented with sinusitis) demonstrated potassium 3.0 and he needs to have this repeated as I do not see this was supplemented.   BRUIT:   He had mild carotid plaque in 2019.     Current medicines are reviewed at length with the patient today.  The patient does not have concerns regarding medicines.  The following changes have been made: None  Labs/ tests ordered today include: None  No orders of the defined types were placed in this encounter.     Disposition:   FU with me in 12 months     Signed, Rollene Rotunda, MD  05/09/2022 4:46 PM    [facility]

## 2022-08-01 ENCOUNTER — Other Ambulatory Visit: Payer: Self-pay | Admitting: Cardiology

## 2023-02-13 ENCOUNTER — Other Ambulatory Visit: Payer: Self-pay | Admitting: *Deleted

## 2023-02-13 DIAGNOSIS — I739 Peripheral vascular disease, unspecified: Secondary | ICD-10-CM

## 2023-02-22 NOTE — Progress Notes (Signed)
Office Note     CC:  follow up Requesting Provider:  Rebekah Chesterfield, NP  HPI: [name] [name] is a 77 y.o. (1946-03-05) male who presents for routine follow up of PAD. He has history of left CFA to below knee popliteal artery bypass with ipsilateral non reversed GSV on 07/27/2020 by Dr. Chestine Spore for CLI of the LLE with rest pain.    Pt was last seen in September of 2023. He overall was doing very well without any claudication,rest pain or tissue loss. He was having some continued swelling on his left lower extremity. He also was having some bilateral hip pain on ambulation and was in process of taking care of his wife after she had recent surgery for her broken leg, so he reported it to be a challenge with his pain.   The pt returns today for follow up. He reports overall he is doing well. His only complaint is continued swelling in his left leg and tingling. He reports sleeping in recliner and that his swelling is improved upon first waking but that within 5-10 minutes it returns. He says he has tingling in left foot, starting in right foot mildly. This is not painful. He denies any pain in his legs on ambulation or rest. No tissue loss. He says he does not walk much but he is primary caregiver raising his two great grand children, who are 39 and 3 years old, so he explains that they keep him busy.    The pt is on a statin for cholesterol management.    The pt is on an aspirin.  Other AC:  none The pt is on CCB, BB, ARB for hypertension.  The pt does  have diabetes. Tobacco hx:  former  Past Medical History:  Diagnosis Date   CAP (community acquired pneumonia) 10/11/2017   Coronary artery disease    a. Cath: 2000 nonobs dz. b. 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 s/p BMS to LAD & RCA ; s/p PCI/DES dRCA & PDA x2 2011. d. cath 02/17/14: LM patent, LAD calcified, diffuse irregs, no high grade stenosis mLAD stent patent, LCx, diffuse irregs no high-grade dz, RCA  total occ w/ L to R collats, medical Rx rec   Diabetes mellitus    Diastolic dysfunction    a. echo 02/18/14: EF 45-50%, mild LVH, AK of inferior and inferoseptal myocardium, GR1DD   Hiatal hernia    Hyperlipidemia    Hypertension    LV dysfunction    NSTEMI (non-ST elevated myocardial infarction) (HCC) 10/07/2017   PVD (peripheral vascular disease) (HCC)    Renal artery stenosis (HCC)    Mild, bilateral   S/P angioplasty with stent, atherectomy DES to pLAD and DES to mLAD 10/10/17  10/11/2017   Sleep apnea    Mild    Past Surgical History:  Procedure Laterality Date   ABDOMINAL AORTOGRAM W/LOWER EXTREMITY N/A 07/21/2020   Procedure: ABDOMINAL AORTOGRAM W/LOWER EXTREMITY;  Surgeon: [name], MD;  Location: [facility];  Service: Cardiovascular;  Laterality: N/A;   CATARACT EXTRACTION     CORONARY ATHERECTOMY N/A 10/10/2017   Procedure: CORONARY ATHERECTOMY - CSI;  Surgeon: Yvonne Kendall, MD;  Location: [facility];  Service: Cardiovascular;  Laterality: N/A;   CORONARY STENT INTERVENTION N/A 10/10/2017   Procedure: CORONARY STENT INTERVENTION;  Surgeon: Yvonne Kendall, MD;  Location: [facility];  Service: Cardiovascular;  Laterality: N/A;   CORONARY STENT INTERVENTION  N/A 07/21/2018   Procedure: CORONARY STENT INTERVENTION;  Surgeon: Yvonne Kendall, MD;  Location: [facility];  Service: Cardiovascular;  Laterality: N/A;   CORONARY ULTRASOUND/IVUS N/A 10/09/2017   Procedure: Intravascular Ultrasound/IVUS;  Surgeon: [name], MD;  Location: [facility] INVASIVE CV LAB;  Service: Cardiovascular;  Laterality: N/A;   CORONARY ULTRASOUND/IVUS N/A 07/21/2018   Procedure: Intravascular Ultrasound/IVUS;  Surgeon: Yvonne Kendall, MD;  Location: [facility];  Service: Cardiovascular;  Laterality: N/A;   FEMORAL-POPLITEAL BYPASS GRAFT Left 07/27/2020   Procedure: LEFT FEMORAL TO BELOW KNEE POPLITEAL ARTERY BYPASS;  Surgeon: [name], MD;   Location: [facility] OR;  Service: Vascular;  Laterality: Left;   HIATAL HERNIA REPAIR     LEFT HEART CATH AND CORONARY ANGIOGRAPHY N/A 10/09/2017   Procedure: LEFT HEART CATH AND CORONARY ANGIOGRAPHY;  Surgeon: [name], MD;  Location: [facility] INVASIVE CV LAB;  Service: Cardiovascular;  Laterality: N/A;   LEFT HEART CATH AND CORONARY ANGIOGRAPHY N/A 07/21/2018   Procedure: LEFT HEART CATH AND CORONARY ANGIOGRAPHY;  Surgeon: Yvonne Kendall, MD;  Location: [facility];  Service: Cardiovascular;  Laterality: N/A;   LEFT HEART CATHETERIZATION WITH CORONARY ANGIOGRAM N/A 02/17/2014   Procedure: LEFT HEART CATHETERIZATION WITH CORONARY ANGIOGRAM;  Surgeon: Micheline Chapman, MD;  Location: [facility] CATH LAB;  Service: Cardiovascular;  Laterality: N/A;   SHOULDER ARTHROSCOPY     Right   SOFT TISSUE TUMOR RESECTION     Vocal cord tumor, abdominal   SOFT TISSUE TUMOR RESECTION     Benign neck tumor   VEIN HARVEST Left 07/27/2020   Procedure: VEIN HARVEST USING  GREATER SAPHENOUS VEIN;  Surgeon: [name], MD;  Location: [facility] OR;  Service: Vascular;  Laterality: Left;   Vocal cord polyp removal      Social History   Socioeconomic History   Marital status: Married    Spouse name: Not on file   Number of children: 3   Years of education: Not on file   Highest education level: Not on file  Occupational History   Occupation: PACKAGING    Employer: RETIRED    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   Social Determinants of Health   Financial Resource Strain: Medium Risk  (07/06/2019)   Received from [facility]   Overall Financial Resource Strain (CARDIA)    Difficulty of Paying Living Expenses: Somewhat hard  Food Insecurity: Food Insecurity Present (07/06/2019)   Received [facility]   Hunger Vital Sign    Worried About Running Out of Food in the Last Year: Often true    Ran Out of Food in the Last Year: Sometimes true  Transportation Needs: No Transportation Needs (07/06/2019)   Received from [facility] - Transportation    Lack of Transportation (Medical): No    Lack of Transportation (Non-Medical): No  Physical Activity: Insufficiently Active (07/06/2019)   Received from [facility]   Exercise Vital  Sign    Days of Exercise per Week: 3 days    Minutes of Exercise per Session: 30 min  Stress: Stress Concern Present (07/06/2019)   Received from [facility] - Occupational Stress Questionnaire    Feeling of Stress : Very much  Social Connections: Unknown (10/30/2021)   Received from [facility]   Social Network    Social Network: Not on file  Intimate Partner Violence: Unknown (09/21/2021)   Received from [facility]   HITS    Physically Hurt: Not on file    Insult or Talk Down To: Not on file    Threaten Physical Harm: Not on file    Scream or Curse: Not on file    Family History  Problem Relation Age of Onset   Heart attack Father 19   Cancer Mother    Cancer Brother     Current Outpatient Medications  Medication Sig Dispense Refill   allopurinol (ZYLOPRIM) 100 MG tablet Take 100 mg by mouth every morning.     amLODipine (NORVASC) 10 MG tablet Take 1 tablet (10 mg total) by mouth daily. (Patient taking differently: Take 730 mg by mouth every morning.) 90 tablet 3   aspirin 81 MG tablet Take 81 mg by mouth every morning.     atorvastatin (LIPITOR) 80 MG tablet TAKE 1 TABLET BY MOUTH EVERY DAY (Patient taking differently: Take 80 mg by mouth at bedtime.) 90 tablet 3    carvedilol (COREG) 25 MG tablet TAKE 1 TABLET BY MOUTH TWO TIMES DAILY WITH A MEAL (Patient taking differently: Take 25 mg by mouth in the morning and at bedtime.) 180 tablet 3   chlorthalidone (HYGROTON) 25 MG tablet TAKE 1 TABLET BY MOUTH EVERY DAY 90 tablet 1   empagliflozin (JARDIANCE) 25 MG TABS tablet Take 25 mg by mouth daily.     fenofibrate (TRICOR) 145 MG tablet TAKE 1 TABLET BY MOUTH EVERY DAY 90 tablet 3   gabapentin (NEURONTIN) 100 MG capsule Take 100 mg by mouth 2 (two) times daily as needed (pain).     isosorbide mononitrate (IMDUR) 120 MG 24 hr tablet Take 1 tablet (120 mg total) by mouth daily. 90 tablet 3   losartan (COZAAR) 100 MG tablet Take 100 mg by mouth every morning.      metFORMIN (GLUCOPHAGE-XR) 500 MG 24 hr tablet Take 500 mg by mouth 2 (two) times daily.     nitroGLYCERIN (NITROSTAT) 0.4 MG SL tablet Place 1 tablet (0.4 mg total) under the tongue every 5 (five) minutes as needed for chest pain. 25 tablet 11   omeprazole (PRILOSEC) 20 MG capsule Take 20 mg by mouth every morning.      oxyCODONE-acetaminophen (PERCOCET/ROXICET) 5-325 MG tablet Take 1 tablet by mouth every 6 (six) hours as needed for moderate pain. 30 tablet 0   TOUJEO MAX SOLOSTAR 300 UNIT/ML Solostar Pen Inject 80 Units into the skin daily.     No current facility-administered medications for this visit.    Allergies  Allergen Reactions   Bee Venom Swelling and Other (See Comments)   Other Other (See Comments)   Penicillin G Other (See Comments)   Penicillins Itching and Swelling    Did it involve swelling of the face/tongue/throat, SOB, or low BP? Yes Did it involve sudden or severe rash/hives, skin peeling, or any reaction on the inside of your mouth or nose? No-itching Did you need to seek medical attention at a hospital or doctor's office?  Yes When did it last happen? Over 10 years        If all above answers are "NO", may proceed with cephalosporin use.    Plavix [Clopidogrel Bisulfate]  Other (See Comments)    Patient is noted to be a nonresponder to Plavix     REVIEW OF SYSTEMS:   [X]  denotes positive finding, [ ]  denotes negative finding Cardiac  Comments:  Chest pain or chest pressure:    Shortness of breath upon exertion:    Short of breath when lying flat:    Irregular heart rhythm:        Vascular    Pain in calf, thigh, or hip brought on by ambulation:    Pain in feet at night that wakes you up from your sleep:     Blood clot in your veins:    Leg swelling:  X       Pulmonary    Oxygen at home:    Productive cough:     Wheezing:         Neurologic    Sudden weakness in arms or legs:     Sudden numbness in arms or legs:     Sudden onset of difficulty speaking or slurred speech:    Temporary loss of vision in one eye:     Problems with dizziness:         Gastrointestinal    Blood in stool:     Vomited blood:         Genitourinary    Burning when urinating:     Blood in urine:        Psychiatric    Major depression:         Hematologic    Bleeding problems:    Problems with blood clotting too easily:        Skin    Rashes or ulcers:        Constitutional    Fever or chills:      PHYSICAL EXAMINATION:  There were no vitals filed for this visit.  General:  WDWN in NAD; vital signs documented above Gait: Normal HENT: WNL, normocephalic Pulmonary: normal non-labored breathing , without wheezing Cardiac: regular HR, no carotid bruits Abdomen: soft, NT, no masses Vascular Exam/Pulses: 2+ femoral pulses, Doppler Left PT/ Pero/ Dp signals, right doppler PT signal Extremities: without ischemic changes, without Gangrene , without cellulitis; without open wounds; edema of left leg Musculoskeletal: no muscle wasting or atrophy  Neurologic: A&O X 3 Psychiatric:  The pt has Normal affect.   Non-Invasive Vascular Imaging:   +-------+-----------+-----------+------------+------------+  ABI/TBIToday's ABIToday's TBIPrevious ABIPrevious  TBI  +-------+-----------+-----------+------------+------------+  Right 0.94       0.18       0.65        0             +-------+-----------+-----------+------------+------------+  Left  [place]         0.32       [place]          0.24          +-------+-----------+-----------+------------+------------+    VAS [place] Lower Extremity Arterial: +-----------+--------+-----+--------+----------+--------+  LEFT      PSV cm/sRatioStenosisWaveform  Comments  +-----------+--------+-----+--------+----------+--------+  CFA Distal 79                   biphasic            +-----------+--------+-----+--------+----------+--------+  ATA Distal 42  monophasic          +-----------+--------+-----+--------+----------+--------+  PTA Distal 61                   monophasic          +-----------+--------+-----+--------+----------+--------+  PERO Distal16                   monophasic          +-----------+--------+-----+--------+----------+--------+     Left Graft #1: Left femoral - below knee popliteal artery bypass  +--------------------+--------+--------+--------+--------+                     PSV cm/sStenosisWaveformComments  +--------------------+--------+--------+--------+--------+  Inflow             79              biphasic          +--------------------+--------+--------+--------+--------+  Proximal Anastomosis99              biphasic          +--------------------+--------+--------+--------+--------+  Proximal Graft      62              biphasic          +--------------------+--------+--------+--------+--------+  Mid Graft           76              biphasic          +--------------------+--------+--------+--------+--------+  Distal Graft        88              biphasic          +--------------------+--------+--------+--------+--------+  Distal Anastomosis  102             biphasic           +--------------------+--------+--------+--------+--------+  Outflow            0       occluded                  +--------------------+--------+--------+--------+--------+   There is retrograde flow in the proximal outflow artery leading to the  proximal popliteal artery where there are collateral vessels.   Summary:  Left: The femoral popliteal artery bypass graft outflow artery is occluded just distal to the distal anastomosis. There is retrograde flow in the proximal outflow artery leading to the proximal popliteal artery where there are collateral vessels.   ASSESSMENT/PLAN:: 77 y.o. male here for follow up for PAD. He has history of left CFA to below knee popliteal artery bypass with ipsilateral non reversed GSV on 07/27/2020 by Dr. Chestine Spore for CLI of the LLE with rest pain. He currently is without any rest pain, claudication or tissue loss. He has swelling in his left leg but this has been present since his bypass and is also improved with elevation. He does also have tingling but I suspect this is related to his diabetes as it is progressing and now in both feet. - His ABI today is essentially unchanged from his prior study - Duplex today shows patent LLE fem popliteal bypass however the outflow popliteal is now occluded and essentially a collateral is now keeping his bypass patent - He has good doppler signals in the left foot and is without any ischemic symptoms  - Initially discussed holding off on any intervention at this time however I discussed duplex findings with Dr. Chestine Spore. He recommends to proceed with Angiography that way we  can see if there is anything we can intervene on to help preserve the bypass graft in the long run - I have called patient to discuss this with him and he is agreeable to proceed. I will have my office arrange his Angiogram in the near future with Dr. Loistine Chance, PA-C [facility] [phone]  Clinic MD:   Steve Rattler

## 2023-02-26 ENCOUNTER — Ambulatory Visit (INDEPENDENT_AMBULATORY_CARE_PROVIDER_SITE_OTHER): Payer: Medicare Other | Admitting: Physician Assistant

## 2023-02-26 ENCOUNTER — Ambulatory Visit (HOSPITAL_COMMUNITY)
Admission: RE | Admit: 2023-02-26 | Discharge: 2023-02-26 | Disposition: A | Discharge status: Home / Self Care | Payer: Medicare Other | Source: Ambulatory Visit | Attending: Vascular Surgery | Admitting: Vascular Surgery

## 2023-02-26 ENCOUNTER — Ambulatory Visit (INDEPENDENT_AMBULATORY_CARE_PROVIDER_SITE_OTHER)
Admission: RE | Admit: 2023-02-26 | Discharge: 2023-02-26 | Disposition: A | Discharge status: Home / Self Care | Payer: Medicare Other | Source: Ambulatory Visit | Attending: Vascular Surgery | Admitting: Vascular Surgery

## 2023-02-26 DIAGNOSIS — I70498 Other atherosclerosis of autologous vein bypass graft(s) of the extremities, other extremity: Secondary | ICD-10-CM | POA: Diagnosis not present

## 2023-02-26 DIAGNOSIS — I739 Peripheral vascular disease, unspecified: Secondary | ICD-10-CM

## 2023-02-26 LAB — VAS US ABI WITH/WO TBI: Right ABI: 0.94

## 2023-03-04 ENCOUNTER — Encounter: Payer: Self-pay | Admitting: Physician Assistant

## 2023-03-06 ENCOUNTER — Other Ambulatory Visit: Payer: Self-pay

## 2023-03-06 DIAGNOSIS — T82858A Stenosis of vascular prosthetic devices, implants and grafts, initial encounter: Secondary | ICD-10-CM

## 2023-03-06 DIAGNOSIS — I739 Peripheral vascular disease, unspecified: Secondary | ICD-10-CM

## 2023-03-06 DIAGNOSIS — I70498 Other atherosclerosis of autologous vein bypass graft(s) of the extremities, other extremity: Secondary | ICD-10-CM

## 2023-03-14 ENCOUNTER — Other Ambulatory Visit: Payer: Self-pay | Admitting: Vascular Surgery

## 2023-03-14 ENCOUNTER — Other Ambulatory Visit: Payer: Self-pay

## 2023-03-14 ENCOUNTER — Ambulatory Visit (HOSPITAL_COMMUNITY)
Admission: RE | Admit: 2023-03-14 | Discharge: 2023-03-14 | Disposition: A | Discharge status: Home / Self Care | Payer: Medicare Other | Attending: Vascular Surgery | Admitting: Vascular Surgery

## 2023-03-14 ENCOUNTER — Encounter (HOSPITAL_COMMUNITY)
Admission: RE | Disposition: A | Discharge status: Home / Self Care | Payer: Self-pay | Source: Home / Self Care | Attending: Vascular Surgery

## 2023-03-14 DIAGNOSIS — I701 Atherosclerosis of renal artery: Secondary | ICD-10-CM | POA: Insufficient documentation

## 2023-03-14 DIAGNOSIS — I10 Essential (primary) hypertension: Secondary | ICD-10-CM | POA: Diagnosis not present

## 2023-03-14 DIAGNOSIS — T82898A Other specified complication of vascular prosthetic devices, implants and grafts, initial encounter: Secondary | ICD-10-CM

## 2023-03-14 DIAGNOSIS — E7800 Pure hypercholesterolemia, unspecified: Secondary | ICD-10-CM | POA: Insufficient documentation

## 2023-03-14 DIAGNOSIS — Z79899 Other long term (current) drug therapy: Secondary | ICD-10-CM | POA: Insufficient documentation

## 2023-03-14 DIAGNOSIS — E1151 Type 2 diabetes mellitus with diabetic peripheral angiopathy without gangrene: Secondary | ICD-10-CM | POA: Diagnosis present

## 2023-03-14 DIAGNOSIS — Z7982 Long term (current) use of aspirin: Secondary | ICD-10-CM | POA: Diagnosis not present

## 2023-03-14 DIAGNOSIS — Z87891 Personal history of nicotine dependence: Secondary | ICD-10-CM | POA: Diagnosis not present

## 2023-03-14 DIAGNOSIS — I70498 Other atherosclerosis of autologous vein bypass graft(s) of the extremities, other extremity: Secondary | ICD-10-CM

## 2023-03-14 DIAGNOSIS — T82858A Stenosis of vascular prosthetic devices, implants and grafts, initial encounter: Secondary | ICD-10-CM

## 2023-03-14 HISTORY — PX: ABDOMINAL AORTOGRAM W/LOWER EXTREMITY: CATH118223

## 2023-03-14 LAB — POCT I-STAT, CHEM 8
BUN: 6 mg/dL — ABNORMAL LOW (ref 8–23)
Calcium, Ion: 1.14 mmol/L — ABNORMAL LOW (ref 1.15–1.40)
Chloride: 104 mmol/L (ref 98–111)
Creatinine, Ser: 0.5 mg/dL — ABNORMAL LOW (ref 0.61–1.24)
Glucose, Bld: 220 mg/dL — ABNORMAL HIGH (ref 70–99)
HCT: 45 % (ref 39.0–52.0)
Hemoglobin: 15.3 g/dL (ref 13.0–17.0)
Potassium: 3.4 mmol/L — ABNORMAL LOW (ref 3.5–5.1)
Sodium: 141 mmol/L (ref 135–145)
TCO2: 23 mmol/L (ref 22–32)

## 2023-03-14 LAB — GLUCOSE, CAPILLARY
Glucose-Capillary: 216 mg/dL — ABNORMAL HIGH (ref 70–99)
Glucose-Capillary: 130 mg/dL — ABNORMAL HIGH (ref 70–99)

## 2023-03-14 SURGERY — ABDOMINAL AORTOGRAM W/LOWER EXTREMITY
Anesthesia: LOCAL | Laterality: Left

## 2023-03-14 MED ORDER — ACETAMINOPHEN 325 MG PO TABS: 650.0000 mg | ORAL_TABLET | ORAL | Status: DC | PRN

## 2023-03-14 MED ORDER — FENTANYL CITRATE (PF) 100 MCG/2ML IJ SOLN
INTRAMUSCULAR | Status: DC | PRN
  Administered 2023-03-14 (×2): 50 ug via INTRAVENOUS

## 2023-03-14 MED ORDER — SODIUM CHLORIDE 0.9% FLUSH: 3.0000 mL | Freq: Two times a day (BID) | INTRAVENOUS | Status: DC

## 2023-03-14 MED ORDER — IODIXANOL 320 MG/ML IV SOLN
INTRAVENOUS | Status: DC | PRN
  Administered 2023-03-14: 44 mL

## 2023-03-14 MED ORDER — LIDOCAINE HCL (PF) 1 % IJ SOLN
INTRAMUSCULAR | Status: DC | PRN
  Administered 2023-03-14: 15 mL

## 2023-03-14 MED ORDER — HYDRALAZINE HCL 20 MG/ML IJ SOLN: 5.0000 mg | INTRAMUSCULAR | Status: DC | PRN

## 2023-03-14 MED ORDER — MIDAZOLAM HCL 2 MG/2ML IJ SOLN
INTRAMUSCULAR | Status: AC
  Filled 2023-03-14: qty 2

## 2023-03-14 MED ORDER — HEPARIN (PORCINE) IN NACL 1000-0.9 UT/500ML-% IV SOLN
INTRAVENOUS | Status: DC | PRN
  Administered 2023-03-14 (×2): 500 mL

## 2023-03-14 MED ORDER — MIDAZOLAM HCL 2 MG/2ML IJ SOLN
INTRAMUSCULAR | Status: DC | PRN
  Administered 2023-03-14: 1 mg via INTRAVENOUS

## 2023-03-14 MED ORDER — SODIUM CHLORIDE 0.9% FLUSH: 3.0000 mL | INTRAVENOUS | Status: DC | PRN

## 2023-03-14 MED ORDER — SODIUM CHLORIDE 0.9 % IV SOLN: INTRAVENOUS | Status: DC

## 2023-03-14 MED ORDER — LIDOCAINE HCL (PF) 1 % IJ SOLN
INTRAMUSCULAR | Status: AC
  Filled 2023-03-14: qty 30

## 2023-03-14 MED ORDER — ONDANSETRON HCL 4 MG/2ML IJ SOLN: 4.0000 mg | Freq: Four times a day (QID) | INTRAMUSCULAR | Status: DC | PRN

## 2023-03-14 MED ORDER — SODIUM CHLORIDE 0.9 % IV SOLN: 250.0000 mL | INTRAVENOUS | Status: DC | PRN

## 2023-03-14 MED ORDER — FENTANYL CITRATE (PF) 100 MCG/2ML IJ SOLN
INTRAMUSCULAR | Status: AC
  Filled 2023-03-14: qty 2

## 2023-03-14 MED ORDER — SODIUM CHLORIDE 0.9 % WEIGHT BASED INFUSION: 1.0000 mL/kg/h | INTRAVENOUS | Status: DC

## 2023-03-14 MED ORDER — LABETALOL HCL 5 MG/ML IV SOLN: 10.0000 mg | INTRAVENOUS | Status: DC | PRN

## 2023-03-14 SURGICAL SUPPLY — 13 items
BAG SNAP BAND KOVER 36X36 (MISCELLANEOUS) IMPLANT
CATH NAVICROSS ANGLED 90CM (MICROCATHETER) IMPLANT
CATH OMNI FLUSH 5F 65CM (CATHETERS) IMPLANT
GLIDEWIRE ADV .035X260CM (WIRE) IMPLANT
KIT MICROPUNCTURE NIT STIFF (SHEATH) IMPLANT
KIT SYRINGE INJ CVI SPIKEX1 (MISCELLANEOUS) IMPLANT
PROTECTION STATION PRESSURIZED (MISCELLANEOUS) ×1
SET ATX-X65L (MISCELLANEOUS) IMPLANT
SHEATH PINNACLE 5F 10CM (SHEATH) IMPLANT
SHEATH PROBE COVER 6X72 (BAG) IMPLANT
STATION PROTECTION PRESSURIZED (MISCELLANEOUS) IMPLANT
TRAY PV CATH (CUSTOM PROCEDURE TRAY) ×2 IMPLANT
WIRE BENTSON .035X145CM (WIRE) IMPLANT

## 2023-03-14 NOTE — H&P (Signed)
Office Note     Patient seen and examined in preop holding.  No complaints. No changes to medication history or physical exam since last seen in clinic. After discussing the risks and benefits of LLE angiogram for assisted bypass patency, [name] [name] elected to proceed.   Victorino Sparrow MD   CC:  follow up Requesting Provider:  No ref. provider found  HPI: [name] [name] is a 77 y.o. (January 08, 1946) male who presents for routine follow up of PAD. He has history of left CFA to below knee popliteal artery bypass with ipsilateral non reversed GSV on 07/27/2020 by Dr. Chestine Spore for CLI of the LLE with rest pain.    Pt was last seen in September of 2023. He overall was doing very well without any claudication,rest pain or tissue loss. He was having some continued swelling on his left lower extremity. He also was having some bilateral hip pain on ambulation and was in process of taking care of his wife after she had recent surgery for her broken leg, so he reported it to be a challenge with his pain.   The pt returns today for follow up. He reports overall he is doing well. His only complaint is continued swelling in his left leg and tingling. He reports sleeping in recliner and that his swelling is improved upon first waking but that within 5-10 minutes it returns. He says he has tingling in left foot, starting in right foot mildly. This is not painful. He denies any pain in his legs on ambulation or rest. No tissue loss. He says he does not walk much but he is primary caregiver raising his two great grand children, who are 44 and 71 years old, so he explains that they keep him busy.    The pt is on a statin for cholesterol management.    The pt is on an aspirin.  Other AC:  none The pt is on CCB, BB, ARB for hypertension.  The pt does  have diabetes. Tobacco hx:  former  Past Medical History:  Diagnosis Date   CAP (community acquired pneumonia) 10/11/2017   Coronary artery disease    a. Cath:  2000 nonobs dz. b. 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 s/p BMS to LAD & RCA ; s/p PCI/DES dRCA & PDA x2 2011. d. cath 02/17/14: LM patent, LAD calcified, diffuse irregs, no high grade stenosis mLAD stent patent, LCx, diffuse irregs no high-grade dz, RCA total occ w/ L to R collats, medical Rx rec   Diabetes mellitus    Diastolic dysfunction    a. echo 02/18/14: EF 45-50%, mild LVH, AK of inferior and inferoseptal myocardium, GR1DD   Hiatal hernia    Hyperlipidemia    Hypertension    LV dysfunction    NSTEMI (non-ST elevated myocardial infarction) (HCC) 10/07/2017   PVD (peripheral vascular disease) (HCC)    Renal artery stenosis (HCC)    Mild, bilateral   S/P angioplasty with stent, atherectomy DES to pLAD and DES to mLAD 10/10/17  10/11/2017   Sleep apnea    Mild    Past Surgical History:  Procedure Laterality Date   ABDOMINAL AORTOGRAM W/LOWER EXTREMITY N/A 07/21/2020   Procedure: ABDOMINAL AORTOGRAM W/LOWER EXTREMITY;  Surgeon: [name], MD;  Location: [facility];  Service: Cardiovascular;  Laterality: N/A;   CATARACT EXTRACTION     CORONARY ATHERECTOMY N/A 10/10/2017   Procedure: CORONARY ATHERECTOMY - CSI;  Surgeon: End,  Cristal Deer, MD;  Location: [facility];  Service: Cardiovascular;  Laterality: N/A;   CORONARY STENT INTERVENTION N/A 10/10/2017   Procedure: CORONARY STENT INTERVENTION;  Surgeon: Yvonne Kendall, MD;  Location: [facility];  Service: Cardiovascular;  Laterality: N/A;   CORONARY STENT INTERVENTION N/A 07/21/2018   Procedure: CORONARY STENT INTERVENTION;  Surgeon: Yvonne Kendall, MD;  Location: [facility];  Service: Cardiovascular;  Laterality: N/A;   CORONARY ULTRASOUND/IVUS N/A 10/09/2017   Procedure: Intravascular Ultrasound/IVUS;  Surgeon: [name], MD;  Location: [facility] INVASIVE CV LAB;  Service: Cardiovascular;  Laterality: N/A;   CORONARY ULTRASOUND/IVUS N/A 07/21/2018   Procedure:  Intravascular Ultrasound/IVUS;  Surgeon: Yvonne Kendall, MD;  Location: [facility];  Service: Cardiovascular;  Laterality: N/A;   FEMORAL-POPLITEAL BYPASS GRAFT Left 07/27/2020   Procedure: LEFT FEMORAL TO BELOW KNEE POPLITEAL ARTERY BYPASS;  Surgeon: [name], MD;  Location: [facility] OR;  Service: Vascular;  Laterality: Left;   HIATAL HERNIA REPAIR     LEFT HEART CATH AND CORONARY ANGIOGRAPHY N/A 10/09/2017   Procedure: LEFT HEART CATH AND CORONARY ANGIOGRAPHY;  Surgeon: [name], MD;  Location: [facility] INVASIVE CV LAB;  Service: Cardiovascular;  Laterality: N/A;   LEFT HEART CATH AND CORONARY ANGIOGRAPHY N/A 07/21/2018   Procedure: LEFT HEART CATH AND CORONARY ANGIOGRAPHY;  Surgeon: Yvonne Kendall, MD;  Location: [facility];  Service: Cardiovascular;  Laterality: N/A;   LEFT HEART CATHETERIZATION WITH CORONARY ANGIOGRAM N/A 02/17/2014   Procedure: LEFT HEART CATHETERIZATION WITH CORONARY ANGIOGRAM;  Surgeon: Micheline Chapman, MD;  Location: [facility] CATH LAB;  Service: Cardiovascular;  Laterality: N/A;   SHOULDER ARTHROSCOPY     Right   SOFT TISSUE TUMOR RESECTION     Vocal cord tumor, abdominal   SOFT TISSUE TUMOR RESECTION     Benign neck tumor   VEIN HARVEST Left 07/27/2020   Procedure: VEIN HARVEST USING  GREATER SAPHENOUS VEIN;  Surgeon: [name], MD;  Location: [facility] OR;  Service: Vascular;  Laterality: Left;   Vocal cord polyp removal      Social History   Socioeconomic History   Marital status: Married    Spouse name: Not on file   Number of children: 3   Years of education: Not on file   Highest education level: Not on file  Occupational History   Occupation: PACKAGING    Employer: RETIRED    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   Social Determinants of Health   Financial Resource Strain: Medium Risk (07/06/2019)   Received from [facility], [facility]   Overall Financial Resource Strain (CARDIA)    Difficulty of Paying Living Expenses: Somewhat hard  Food Insecurity: Food Insecurity Present (07/06/2019)   Received from [facility], [facility]   Hunger Vital Sign    Worried About Running Out of Food in the Last Year: Often true    Ran Out of Food in the Last  Year: Sometimes true  Transportation Needs: No Transportation Needs (07/06/2019)   Received from [facility], [facility]   [facility] - Transportation    Lack of Transportation (Medical): No    Lack of Transportation (Non-Medical): No  Physical Activity: Insufficiently Active (07/06/2019)   Received from [facility], [facility]   Exercise Vital Sign    Days of Exercise per Week: 3 days    Minutes of Exercise per Session: 30 min  Stress: Stress Concern Present (07/06/2019)   Received from [facility], [facility] - Occupational Stress Questionnaire    Feeling of Stress : Very much  Social Connections: Unknown (10/30/2021)   Received from [facility], [facility]   Social Network    Social Network: Not on file  Intimate Partner Violence: Unknown (09/21/2021)   [facility] from [facility], [facility]   HITS    Physically Hurt: Not on file    Insult or Talk Down To: Not on file    Threaten Physical Harm: Not on file    Scream or Curse: Not on file    Family History  Problem Relation Age of Onset   Heart attack Father 5   Cancer Mother    Cancer Brother     Current Facility-Administered Medications  Medication Dose Route Frequency Provider Last Rate Last Admin   0.9 %  sodium  chloride infusion   Intravenous Continuous [name], MD 100 mL/hr at 03/14/23 0931 New Bag at 03/14/23 0931   Heparin (Porcine) in NaCl 1000-0.9 UT/500ML-% SOLN    PRN Victorino Sparrow, MD   500 mL at 03/14/23 1121    Allergies  Allergen Reactions   Bee Venom Swelling and Other (See Comments)   Other Other (See Comments)   Penicillin G Other (See Comments)   Penicillins Itching and Swelling    Did it involve swelling of the face/tongue/throat, SOB, or low BP? Yes Did it involve sudden or severe rash/hives, skin peeling, or any reaction on the inside of your mouth or nose? No-itching Did you need to seek medical attention at a hospital or doctor's office? Yes When did it last happen? Over 10 years        If all above answers are "NO", may proceed with cephalosporin use.    Plavix [Clopidogrel Bisulfate] Other (See Comments)    Patient is noted to be a nonresponder to Plavix     REVIEW OF SYSTEMS:   [X]  denotes positive finding, [ ]  denotes negative finding Cardiac  Comments:  Chest pain or chest pressure:    Shortness of breath upon exertion:    Short of breath when lying flat:    Irregular heart rhythm:        Vascular    Pain in calf, thigh, or hip brought on by ambulation:    Pain in feet at night that wakes you up from your sleep:     Blood clot in your veins:    Leg swelling:  X       Pulmonary    Oxygen at home:    Productive cough:     Wheezing:         Neurologic    Sudden weakness in arms or legs:     Sudden numbness in arms or legs:     Sudden onset of difficulty speaking or slurred speech:    Temporary loss of vision in one eye:     Problems with dizziness:  Gastrointestinal    Blood in stool:     Vomited blood:         Genitourinary    Burning when urinating:     Blood in urine:        Psychiatric    Major depression:         Hematologic    Bleeding problems:    Problems with blood clotting too easily:        Skin    Rashes  or ulcers:        Constitutional    Fever or chills:      PHYSICAL EXAMINATION:  Vitals:   03/14/23 0904 03/14/23 1118  BP: (!) 179/28   Pulse: 87   Resp: 20   Temp: 97.9 F (36.6 C)   TempSrc: Temporal   SpO2: 95% 97%  Weight: 89.8 kg   Height: 5\' 10"  (1.778 m)     General:  WDWN in NAD; vital signs documented above Gait: Normal HENT: WNL, normocephalic Pulmonary: normal non-labored breathing , without wheezing Cardiac: regular HR, no carotid bruits Abdomen: soft, NT, no masses Vascular Exam/Pulses: 2+ femoral pulses, Doppler Left PT/ Pero/ Dp signals, right doppler PT signal Extremities: without ischemic changes, without Gangrene , without cellulitis; without open wounds; edema of left leg Musculoskeletal: no muscle wasting or atrophy  Neurologic: A&O X 3 Psychiatric:  The pt has Normal affect.   Non-Invasive Vascular Imaging:   +-------+-----------+-----------+------------+------------+  ABI/TBIToday's ABIToday's TBIPrevious ABIPrevious TBI  +-------+-----------+-----------+------------+------------+  Right 0.94       0.18       0.65        0             +-------+-----------+-----------+------------+------------+  Left  [place]         0.32       [place]          0.24          +-------+-----------+-----------+------------+------------+    VAS [place] Lower Extremity Arterial: +-----------+--------+-----+--------+----------+--------+  LEFT      PSV cm/sRatioStenosisWaveform  Comments  +-----------+--------+-----+--------+----------+--------+  CFA Distal 79                   biphasic            +-----------+--------+-----+--------+----------+--------+  ATA Distal 42                   monophasic          +-----------+--------+-----+--------+----------+--------+  PTA Distal 61                   monophasic          +-----------+--------+-----+--------+----------+--------+  PERO Distal16                   monophasic           +-----------+--------+-----+--------+----------+--------+     Left Graft #1: Left femoral - below knee popliteal artery bypass  +--------------------+--------+--------+--------+--------+                     PSV cm/sStenosisWaveformComments  +--------------------+--------+--------+--------+--------+  Inflow             79              biphasic          +--------------------+--------+--------+--------+--------+  Proximal Anastomosis99              biphasic          +--------------------+--------+--------+--------+--------+  Proximal Graft  62              biphasic          +--------------------+--------+--------+--------+--------+  Mid Graft           76              biphasic          +--------------------+--------+--------+--------+--------+  Distal Graft        88              biphasic          +--------------------+--------+--------+--------+--------+  Distal Anastomosis  102             biphasic          +--------------------+--------+--------+--------+--------+  Outflow            0       occluded                  +--------------------+--------+--------+--------+--------+   There is retrograde flow in the proximal outflow artery leading to the  proximal popliteal artery where there are collateral vessels.   Summary:  Left: The femoral popliteal artery bypass graft outflow artery is occluded just distal to the distal anastomosis. There is retrograde flow in the proximal outflow artery leading to the proximal popliteal artery where there are collateral vessels.   ASSESSMENT/PLAN:: 77 y.o. male here for follow up for PAD. He has history of left CFA to below knee popliteal artery bypass with ipsilateral non reversed GSV on 07/27/2020 by Dr. Chestine Spore for CLI of the LLE with rest pain. He currently is without any rest pain, claudication or tissue loss. He has swelling in his left leg but this has been present since his bypass and is also  improved with elevation. He does also have tingling but I suspect this is related to his diabetes as it is progressing and now in both feet. - His ABI today is essentially unchanged from his prior study - Duplex today shows patent LLE fem popliteal bypass however the outflow popliteal is now occluded and essentially a collateral is now keeping his bypass patent - He has good doppler signals in the left foot and is without any ischemic symptoms  - Initially discussed holding off on any intervention at this time however I discussed duplex findings with Dr. Chestine Spore. He recommends to proceed with Angiography that way we can see if there is anything we can intervene on to help preserve the bypass graft in the long run - I have called patient to discuss this with him and he is agreeable to proceed. I will have my office arrange his Angiogram in the near future with Dr. Jomarie Longs, PA-C [facility] [phone]  Clinic MD:   Steve Rattler

## 2023-03-14 NOTE — Progress Notes (Signed)
22fr sheath was removed from patient's RFA using manual pressure.  Pressure was held for 25 minutes with no complications.  No hematoma or oozing noted during hold and patient tolerated well.  A tegaderm and gauze was applied to the site and post care instructions were given to the patient upon completion.  POST Vitals: WNL

## 2023-03-14 NOTE — Op Note (Signed)
[name] MRN: [id number] DOB: [birth year] Sex: male  03/14/2023 Pre-operative Diagnosis: Threatened left lower extremity bypass Post-operative diagnosis:  Same Surgeon:  Victorino Sparrow, MD Procedure Performed: 1.  Ultrasound-guided micropuncture access of the right common femoral artery in retrograde fashion 2.  Aortogram 3.  Second-order cannulation, left lower extremity angiogram 4.  Moderate sedation time 22 minutes, contrast volume 44 mL 5.  Manual pressure used for closure    Indications: Patient is a 77 year old gentleman with previous history of left-sided femoral to below-knee popliteal artery bypass using greater saphenous vein.  At most recent visit, arterial duplex ultrasound demonstrated occlusion distally with bypass patency living off of retrograde flow into the popliteal artery.  After discussing the risk and benefits of left lower extremity angiogram in effort to define possible outflow issues and improve this for primary assisted bypass patency Samiel elected to proceed.  Findings:  Aortogram: Greater than 80% stenosis of the right renal artery, widely patent left renal artery Infrarenal aorta widely patent. Diffuse atherosclerotic disease in the aortoiliac segments bilaterally with focal, eccentric plaque in the left common iliac artery creating 50% stenosis.  Greater than 80% stenosis at the right common iliac bifurcation. Bilateral hypogastric arteries patent, bilateral external iliac arteries without stenosis.  On the left: Widely patent common femoral artery, profunda, femoral to below-knee popliteal artery bypass.  The superficial femoral artery was occluded.  Distally, the bypass had a widely patent anastomosis with runoff into the below-knee popliteal artery.  The anterior tibial artery was patent in its entirety.  There was eccentric plaque in the distal tibioperoneal trunk, however this did not appear flow-limiting as flow into both the  posterior tibial and peroneal arteries at the same velocities in the anterior tibial artery.  There was three-vessel runoff.  The foot fills via the dorsalis pedis and plantar arteries.   Procedure:  The patient was identified in the holding area and taken to room 8.  The patient was then placed supine on the table and prepped and draped in the usual sterile fashion.  A time out was called.  Ultrasound was used to evaluate the right common femoral artery.  It was patent .  A digital ultrasound image was acquired.  A micropuncture needle was used to access the right common femoral artery under ultrasound guidance.  An 018 wire was advanced without resistance and a micropuncture sheath was placed.  The 018 wire was removed and a benson wire was placed.  The micropuncture sheath was exchanged for a 5 french sheath.  An omniflush catheter was advanced over the wire to the level of L-1.  An abdominal angiogram was obtained.  Next, using the omniflush catheter and a benson wire, the aortic bifurcation was crossed and the catheter was placed into theleft external iliac artery and left runoff was obtained.  See results above.  No intervention needed.  Impression: Widely patent femoral to below-knee popliteal artery bypass with three-vessel runoff to the foot.   Victorino Sparrow MD [facility] [place] Office: [phone]

## 2023-03-15 ENCOUNTER — Encounter (HOSPITAL_COMMUNITY): Payer: Self-pay | Admitting: Vascular Surgery

## 2023-05-21 ENCOUNTER — Observation Stay (HOSPITAL_COMMUNITY)
Admission: EM | Admit: 2023-05-21 | Discharge: 2023-05-23 | Disposition: A | Discharge status: Home / Self Care | Payer: Medicare Other | Attending: Family Medicine | Admitting: Family Medicine

## 2023-05-21 ENCOUNTER — Other Ambulatory Visit: Payer: Self-pay

## 2023-05-21 ENCOUNTER — Emergency Department (HOSPITAL_COMMUNITY): Payer: Medicare Other

## 2023-05-21 ENCOUNTER — Encounter (HOSPITAL_COMMUNITY): Payer: Self-pay | Admitting: Emergency Medicine

## 2023-05-21 DIAGNOSIS — J810 Acute pulmonary edema: Secondary | ICD-10-CM | POA: Diagnosis not present

## 2023-05-21 DIAGNOSIS — E872 Acidosis, unspecified: Secondary | ICD-10-CM | POA: Diagnosis present

## 2023-05-21 DIAGNOSIS — E1159 Type 2 diabetes mellitus with other circulatory complications: Secondary | ICD-10-CM | POA: Insufficient documentation

## 2023-05-21 DIAGNOSIS — J9601 Acute respiratory failure with hypoxia: Secondary | ICD-10-CM | POA: Diagnosis present

## 2023-05-21 DIAGNOSIS — Z955 Presence of coronary angioplasty implant and graft: Secondary | ICD-10-CM | POA: Diagnosis not present

## 2023-05-21 DIAGNOSIS — I110 Hypertensive heart disease with heart failure: Secondary | ICD-10-CM | POA: Diagnosis not present

## 2023-05-21 DIAGNOSIS — R2681 Unsteadiness on feet: Secondary | ICD-10-CM | POA: Insufficient documentation

## 2023-05-21 DIAGNOSIS — M6281 Muscle weakness (generalized): Secondary | ICD-10-CM | POA: Insufficient documentation

## 2023-05-21 DIAGNOSIS — K219 Gastro-esophageal reflux disease without esophagitis: Secondary | ICD-10-CM | POA: Diagnosis not present

## 2023-05-21 DIAGNOSIS — E041 Nontoxic single thyroid nodule: Secondary | ICD-10-CM | POA: Diagnosis not present

## 2023-05-21 DIAGNOSIS — E785 Hyperlipidemia, unspecified: Secondary | ICD-10-CM | POA: Insufficient documentation

## 2023-05-21 DIAGNOSIS — G4733 Obstructive sleep apnea (adult) (pediatric): Secondary | ICD-10-CM | POA: Diagnosis not present

## 2023-05-21 DIAGNOSIS — I10 Essential (primary) hypertension: Secondary | ICD-10-CM | POA: Diagnosis present

## 2023-05-21 DIAGNOSIS — Z9889 Other specified postprocedural states: Secondary | ICD-10-CM | POA: Insufficient documentation

## 2023-05-21 DIAGNOSIS — I160 Hypertensive urgency: Secondary | ICD-10-CM | POA: Diagnosis present

## 2023-05-21 DIAGNOSIS — I5021 Acute systolic (congestive) heart failure: Secondary | ICD-10-CM | POA: Diagnosis not present

## 2023-05-21 DIAGNOSIS — Z95820 Peripheral vascular angioplasty status with implants and grafts: Secondary | ICD-10-CM

## 2023-05-21 DIAGNOSIS — Z7982 Long term (current) use of aspirin: Secondary | ICD-10-CM | POA: Diagnosis not present

## 2023-05-21 DIAGNOSIS — R2689 Other abnormalities of gait and mobility: Secondary | ICD-10-CM | POA: Diagnosis not present

## 2023-05-21 DIAGNOSIS — I501 Left ventricular failure: Secondary | ICD-10-CM

## 2023-05-21 DIAGNOSIS — E782 Mixed hyperlipidemia: Secondary | ICD-10-CM | POA: Diagnosis present

## 2023-05-21 DIAGNOSIS — Z87891 Personal history of nicotine dependence: Secondary | ICD-10-CM | POA: Insufficient documentation

## 2023-05-21 DIAGNOSIS — I25118 Atherosclerotic heart disease of native coronary artery with other forms of angina pectoris: Secondary | ICD-10-CM | POA: Diagnosis not present

## 2023-05-21 DIAGNOSIS — Z79899 Other long term (current) drug therapy: Secondary | ICD-10-CM | POA: Diagnosis not present

## 2023-05-21 DIAGNOSIS — Z1152 Encounter for screening for COVID-19: Secondary | ICD-10-CM | POA: Diagnosis not present

## 2023-05-21 DIAGNOSIS — Z7984 Long term (current) use of oral hypoglycemic drugs: Secondary | ICD-10-CM | POA: Diagnosis not present

## 2023-05-21 DIAGNOSIS — I739 Peripheral vascular disease, unspecified: Secondary | ICD-10-CM | POA: Diagnosis present

## 2023-05-21 DIAGNOSIS — R0602 Shortness of breath: Secondary | ICD-10-CM | POA: Insufficient documentation

## 2023-05-21 DIAGNOSIS — Z8679 Personal history of other diseases of the circulatory system: Secondary | ICD-10-CM | POA: Insufficient documentation

## 2023-05-21 DIAGNOSIS — I25119 Atherosclerotic heart disease of native coronary artery with unspecified angina pectoris: Secondary | ICD-10-CM | POA: Diagnosis present

## 2023-05-21 DIAGNOSIS — J811 Chronic pulmonary edema: Secondary | ICD-10-CM | POA: Diagnosis present

## 2023-05-21 DIAGNOSIS — G4730 Sleep apnea, unspecified: Secondary | ICD-10-CM | POA: Diagnosis present

## 2023-05-21 DIAGNOSIS — R55 Syncope and collapse: Secondary | ICD-10-CM | POA: Diagnosis present

## 2023-05-21 DIAGNOSIS — I2510 Atherosclerotic heart disease of native coronary artery without angina pectoris: Secondary | ICD-10-CM | POA: Diagnosis present

## 2023-05-21 LAB — CBC
HCT: 44.5 % (ref 39.0–52.0)
Hemoglobin: 14.8 g/dL (ref 13.0–17.0)
MCH: 28.7 pg (ref 26.0–34.0)
MCHC: 33.3 g/dL (ref 30.0–36.0)
MCV: 86.4 fL (ref 80.0–100.0)
Platelets: 190 10*3/uL (ref 150–400)
RBC: 5.15 MIL/uL (ref 4.22–5.81)
RDW: 13.6 % (ref 11.5–15.5)
WBC: 15.1 10*3/uL — ABNORMAL HIGH (ref 4.0–10.5)
nRBC: 0 % (ref 0.0–0.2)

## 2023-05-21 LAB — BASIC METABOLIC PANEL
Anion gap: 13 (ref 5–15)
BUN: 18 mg/dL (ref 8–23)
CO2: 26 mmol/L (ref 22–32)
Calcium: 8.5 mg/dL — ABNORMAL LOW (ref 8.9–10.3)
Chloride: 97 mmol/L — ABNORMAL LOW (ref 98–111)
Creatinine, Ser: 1.02 mg/dL (ref 0.61–1.24)
GFR, Estimated: >60 mL/min (ref >60)
Glucose, Bld: 410 mg/dL — ABNORMAL HIGH (ref 70–99)
Potassium: 3.4 mmol/L — ABNORMAL LOW (ref 3.5–5.1)
Sodium: 136 mmol/L (ref 135–145)

## 2023-05-21 LAB — APTT: aPTT: 23 s — ABNORMAL LOW (ref 24–36)

## 2023-05-21 LAB — CBG MONITORING, ED: Glucose-Capillary: 431 mg/dL — ABNORMAL HIGH (ref 70–99)

## 2023-05-21 MED ORDER — SODIUM CHLORIDE 0.9 % IV BOLUS
1000.0000 mL | Freq: Once | INTRAVENOUS | Status: AC
  Administered 2023-05-22: 1000 mL via INTRAVENOUS

## 2023-05-21 NOTE — ED Triage Notes (Signed)
Pt had syncope when he stood up. He told his wife he was having trouble breathing before passing out. Ems placed c collar on pt.

## 2023-05-21 NOTE — ED Provider Notes (Signed)
[place] [facility] AT [facility] Provider Note   CSN: [id number] Arrival date & time: 05/21/23  2138     History {Add pertinent medical, surgical, social history, OB history to HPI:1} Chief Complaint  Patient presents with   Loss of Consciousness    [name] [name] is a 77 y.o. male.  The history is provided by the patient and the spouse.  Patient with extensive history including CAD, hypertension, peripheral vascular disease presents for syncopal episode.  Patient reports over the past several days has had increasing cough and congestion.  He reports his wife has had similar symptoms.  Tonight his wife reports he was walking on the hallway "staggering" and then fell on the floor and appeared to lose consciousness for about 10 minutes.  No seizures are reported  after EMS arrived he appeared to improved. Patient reports he does not recall the event.  He denies any chest pain or back pain.  No abdominal pain or any focal extremity weakness.  Denies any head trauma or headache. He is not on home oxygen, but is a former smoker   Past Medical History:  Diagnosis Date   CAP (community acquired pneumonia) 10/11/2017   Coronary artery disease    a. Cath: 2000 nonobs dz. b. 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 s/p BMS to LAD & RCA ; s/p PCI/DES dRCA & PDA x2 2011. d. cath 02/17/14: LM patent, LAD calcified, diffuse irregs, no high grade stenosis mLAD stent patent, LCx, diffuse irregs no high-grade dz, RCA total occ w/ L to R collats, medical Rx rec   Diabetes mellitus    Diastolic dysfunction    a. echo 02/18/14: EF 45-50%, mild LVH, AK of inferior and inferoseptal myocardium, GR1DD   Hiatal hernia    Hyperlipidemia    Hypertension    LV dysfunction    NSTEMI (non-ST elevated myocardial infarction) (HCC) 10/07/2017   PVD (peripheral vascular disease) (HCC)    Renal artery stenosis (HCC)    Mild, bilateral   S/P angioplasty with  stent, atherectomy DES to pLAD and DES to mLAD 10/10/17  10/11/2017   Sleep apnea    Mild    Home Medications Prior to Admission medications   Medication Sig Start Date End Date Taking? Authorizing Provider  allopurinol (ZYLOPRIM) 100 MG tablet Take 100 mg by mouth every morning.   Yes [provider]  amLODipine (NORVASC) 10 MG tablet Take 1 tablet (10 mg total) by mouth daily. Patient taking differently: Take 10 mg by mouth every morning. 09/24/12  Yes Rollene Rotunda, MD  aspirin 81 MG tablet Take 81 mg by mouth every morning.   Yes [provider]  atorvastatin (LIPITOR) 80 MG tablet TAKE 1 TABLET BY MOUTH EVERY DAY Patient taking differently: Take 80 mg by mouth at bedtime. 08/12/19  Yes Iran Ouch, MD  carvedilol (COREG) 25 MG tablet TAKE 1 TABLET BY MOUTH TWO TIMES DAILY WITH A MEAL Patient taking differently: Take 25 mg by mouth in the morning and at bedtime. 08/12/19  Yes Iran Ouch, MD  chlorthalidone (HYGROTON) 25 MG tablet TAKE 1 TABLET BY MOUTH EVERY DAY 08/02/22  Yes Rollene Rotunda, MD  empagliflozin (JARDIANCE) 25 MG TABS tablet Take 25 mg by mouth daily.   Yes [provider]  fenofibrate (TRICOR) 145 MG tablet TAKE 1 TABLET BY MOUTH EVERY DAY 07/19/21  Yes Rollene Rotunda, MD  isosorbide mononitrate (IMDUR) 120 MG 24 hr tablet  Take 1 tablet (120 mg total) by mouth daily. Patient taking differently: Take 120 mg by mouth at bedtime. 11/20/21  Yes Rollene Rotunda, MD  losartan (COZAAR) 100 MG tablet Take 100 mg by mouth every morning.    Yes [provider]  metFORMIN (GLUCOPHAGE-XR) 500 MG 24 hr tablet Take 500 mg by mouth 2 (two) times daily.   Yes [provider]  nitroGLYCERIN (NITROSTAT) 0.4 MG SL tablet Place 1 tablet (0.4 mg total) under the tongue every 5 (five) minutes as needed for chest pain. 05/09/22  Yes Rollene Rotunda, MD  omeprazole (PRILOSEC) 20 MG capsule Take 20 mg by mouth every morning.    Yes [provider]  TOUJEO MAX SOLOSTAR 300 UNIT/ML Solostar Pen Inject 100 Units into the skin daily. 03/01/22  Yes [provider]      Allergies    Bee venom, Other, Penicillin g, Penicillins, and Plavix [clopidogrel bisulfate]    Review of Systems   Review of Systems  Constitutional:  Negative for fever.  Respiratory:  Positive for cough and shortness of breath.   Cardiovascular:  Negative for chest pain.  Gastrointestinal:  Negative for abdominal pain.  Musculoskeletal:  Negative for back pain.  Neurological:  Positive for syncope.    Physical Exam Updated Vital Signs BP (!) 169/77   Pulse (!) 105   Temp 97.7 F (36.5 C) (Oral)   Resp (!) 28   Ht 1.778 m (5\' 10" )   Wt 90 kg   SpO2 96%   BMI 28.47 kg/m  Physical Exam CONSTITUTIONAL: Elderly, no acute distress HEAD: Normocephalic/atraumatic, no head trauma EYES: EOMI/PERRL ENMT: Mucous membranes moist, no facial trauma NECK: Cervical collar in place SPINE/BACK:entire spine nontender No bruising/crepitance/stepoffs noted to spine CV: S1/S2 noted, no murmurs/rubs/gallops noted LUNGS: Mild tachypnea, on nasal cannula, coarse breath sounds in the bases ABDOMEN: soft, nontender, no rebound or guarding GU:no cva tenderness NEURO: Pt is awake/alert/appropriate, moves all extremitiesx4.  No facial droop.  No arm or leg drift EXTREMITIES: pulses normal/equal, full ROM No deformities SKIN: warm, color normal PSYCH: no abnormalities of mood noted, alert and oriented to situation  ED Results / Procedures / Treatments   Labs (all labs ordered are listed, but only abnormal results are displayed) Labs Reviewed  BASIC METABOLIC PANEL - Abnormal; Notable for the following components:      Result Value   Potassium 3.4 (*)    Chloride 97 (*)    Glucose, Bld 410 (*)    Calcium 8.5 (*)    All other components within normal limits  CBC - Abnormal; Notable for the following components:   WBC 15.1 (*)    All other  components within normal limits  APTT - Abnormal; Notable for the following components:   aPTT 23 (*)    All other components within normal limits  CBG MONITORING, ED - Abnormal; Notable for the following components:   Glucose-Capillary 431 (*)    All other components within normal limits  CULTURE, BLOOD (ROUTINE X 2)  CULTURE, BLOOD (ROUTINE X 2)  RESP PANEL BY RT-PCR (RSV, FLU A&B, COVID)  RVPGX2  URINALYSIS, ROUTINE W REFLEX MICROSCOPIC  LACTIC ACID, PLASMA  LACTIC ACID, PLASMA    EKG EKG Interpretation Date/Time:  Tuesday May 21 2023 21:48:57 EST Ventricular Rate:  100 PR Interval:  156 QRS Duration:  101 QT Interval:  339 QTC Calculation: 438 R Axis:   69  Text Interpretation: Sinus tachycardia Nonspecific repol abnormality, diffuse leads Confirmed by  Zadie Rhine ([id number]) on 05/21/2023 11:17:00 PM  Radiology No results found.  Procedures Procedures  {Document cardiac monitor, telemetry assessment procedure when appropriate:1}  Medications Ordered in ED Medications - No data to display  ED Course/ Medical Decision Making/ A&P Clinical Course as of 05/21/23 2352  Tue May 21, 2023  2345 Glucose(!): 410 Hyperglycemia without anion gap [DW]  2345 WBC(!): 15.1 Leukocytosis [DW]    Clinical Course User Index [DW] Zadie Rhine, MD   {   Click here for ABCD2, HEART and other calculatorsREFRESH Note before signing :1}         Glasgow Coma Scale Score: 15                      Medical Decision Making Amount and/or Complexity of Data Reviewed Labs: ordered. Decision-making details documented in ED Course. Radiology: ordered.   This patient presents to the ED for concern of syncope, this involves an extensive number of treatment options, and is a complaint that carries with it a high risk of complications and morbidity.  The differential diagnosis includes but is not limited to vasovagal syncope, orthostatic hypotension, cardiac dysrhythmia, subarachnoid  hemorrhage, pulmonary embolism  Comorbidities that complicate the patient evaluation: Patient's presentation is complicated by their history of CAD and peripheral vascular disease  Social Determinants of Health: Patient's  history of tobacco use   increases the complexity of managing their presentation  Additional history obtained: Additional history obtained from spouse Records reviewed previous admission documents  Lab Tests: I Ordered, and personally interpreted labs.  The pertinent results include: Leukocytosis, hyperglycemia  Imaging Studies ordered: I ordered imaging studies including CT scan head and C-spine and X-ray chest   I independently visualized and interpreted imaging which showed *** I agree with the radiologist interpretation  Cardiac Monitoring: The patient was maintained on a cardiac monitor.  I personally viewed and interpreted the cardiac monitor which showed an underlying rhythm of:  sinus tachycardia  Medicines ordered and prescription drug management: I ordered medication including ***  for ***  Reevaluation of the patient after these medicines showed that the patient    {resolved/improved/worsened:23923::"improved"}  Test Considered: Patient is low risk / negative by ***, therefore do not feel that *** is indicated.  Critical Interventions:  ***  Consultations Obtained: I requested consultation with the {consultation:26851}, and discussed  findings as well as pertinent plan - they recommend: ***  Reevaluation: After the interventions noted above, I reevaluated the patient and found that they have :{resolved/improved/worsened:23923::"improved"}  Complexity of problems addressed: Patient's presentation is most consistent with  {CBJS:28315}  Disposition: After consideration of the diagnostic results and the patient's response to treatment,  I feel that the patent would benefit from {disposition:26850}.     {Document critical care time when  appropriate:1} {Document review of labs and clinical decision tools ie heart score, Chads2Vasc2 etc:1}  {Document your independent review of radiology images, and any outside records:1} {Document your discussion with family members, caretakers, and with consultants:1} {Document social determinants of health affecting pt's care:1} {Document your decision making why or why not admission, treatments were needed:1} Final Clinical Impression(s) / ED Diagnoses Final diagnoses:  None    Rx / DC Orders ED Discharge Orders     None

## 2023-05-22 ENCOUNTER — Other Ambulatory Visit (HOSPITAL_COMMUNITY): Payer: Self-pay | Admitting: *Deleted

## 2023-05-22 ENCOUNTER — Observation Stay (HOSPITAL_BASED_OUTPATIENT_CLINIC_OR_DEPARTMENT_OTHER): Payer: Medicare Other

## 2023-05-22 ENCOUNTER — Emergency Department (HOSPITAL_COMMUNITY): Payer: Medicare Other

## 2023-05-22 ENCOUNTER — Observation Stay (HOSPITAL_COMMUNITY): Payer: Medicare Other

## 2023-05-22 DIAGNOSIS — I10 Essential (primary) hypertension: Secondary | ICD-10-CM

## 2023-05-22 DIAGNOSIS — I739 Peripheral vascular disease, unspecified: Secondary | ICD-10-CM | POA: Diagnosis not present

## 2023-05-22 DIAGNOSIS — R55 Syncope and collapse: Secondary | ICD-10-CM | POA: Diagnosis not present

## 2023-05-22 DIAGNOSIS — J9601 Acute respiratory failure with hypoxia: Secondary | ICD-10-CM | POA: Diagnosis present

## 2023-05-22 DIAGNOSIS — I5021 Acute systolic (congestive) heart failure: Secondary | ICD-10-CM

## 2023-05-22 DIAGNOSIS — J810 Acute pulmonary edema: Secondary | ICD-10-CM

## 2023-05-22 DIAGNOSIS — I160 Hypertensive urgency: Secondary | ICD-10-CM

## 2023-05-22 DIAGNOSIS — G4730 Sleep apnea, unspecified: Secondary | ICD-10-CM

## 2023-05-22 DIAGNOSIS — E872 Acidosis, unspecified: Secondary | ICD-10-CM | POA: Diagnosis present

## 2023-05-22 DIAGNOSIS — I25119 Atherosclerotic heart disease of native coronary artery with unspecified angina pectoris: Secondary | ICD-10-CM | POA: Diagnosis not present

## 2023-05-22 LAB — CBC
HCT: 45.7 % (ref 39.0–52.0)
Hemoglobin: 15.3 g/dL (ref 13.0–17.0)
MCH: 28.9 pg (ref 26.0–34.0)
MCHC: 33.5 g/dL (ref 30.0–36.0)
MCV: 86.2 fL (ref 80.0–100.0)
Platelets: 216 10*3/uL (ref 150–400)
RBC: 5.3 MIL/uL (ref 4.22–5.81)
RDW: 13.8 % (ref 11.5–15.5)
WBC: 16.7 10*3/uL — ABNORMAL HIGH (ref 4.0–10.5)
nRBC: 0 % (ref 0.0–0.2)

## 2023-05-22 LAB — LACTIC ACID, PLASMA
Lactic Acid, Venous: 4.1 mmol/L (ref 0.5–1.9)
Lactic Acid, Venous: 4.2 mmol/L (ref 0.5–1.9)
Lactic Acid, Venous: 2.6 mmol/L (ref 0.5–1.9)
Lactic Acid, Venous: 2.9 mmol/L (ref 0.5–1.9)
Lactic Acid, Venous: 3.4 mmol/L (ref 0.5–1.9)

## 2023-05-22 LAB — URINALYSIS, ROUTINE W REFLEX MICROSCOPIC
Bacteria, UA: NONE SEEN
Bilirubin Urine: NEGATIVE
Glucose, UA: >=500 mg/dL — AB
Ketones, ur: NEGATIVE mg/dL
Leukocytes,Ua: NEGATIVE
Nitrite: NEGATIVE
Protein, ur: >=300 mg/dL — AB
Specific Gravity, Urine: 1.024 (ref 1.005–1.030)
pH: 6 (ref 5.0–8.0)

## 2023-05-22 LAB — BASIC METABOLIC PANEL
Anion gap: 12 (ref 5–15)
BUN: 16 mg/dL (ref 8–23)
CO2: 25 mmol/L (ref 22–32)
Calcium: 8.3 mg/dL — ABNORMAL LOW (ref 8.9–10.3)
Chloride: 100 mmol/L (ref 98–111)
Creatinine, Ser: 0.78 mg/dL (ref 0.61–1.24)
GFR, Estimated: >60 mL/min (ref >60)
Glucose, Bld: 417 mg/dL — ABNORMAL HIGH (ref 70–99)
Potassium: 3.3 mmol/L — ABNORMAL LOW (ref 3.5–5.1)
Sodium: 137 mmol/L (ref 135–145)

## 2023-05-22 LAB — RESP PANEL BY RT-PCR (RSV, FLU A&B, COVID)  RVPGX2
Influenza A by PCR: NEGATIVE
Influenza B by PCR: NEGATIVE
Resp Syncytial Virus by PCR: NEGATIVE
SARS Coronavirus 2 by RT PCR: NEGATIVE

## 2023-05-22 LAB — GLUCOSE, CAPILLARY
Glucose-Capillary: 318 mg/dL — ABNORMAL HIGH (ref 70–99)
Glucose-Capillary: 104 mg/dL — ABNORMAL HIGH (ref 70–99)
Glucose-Capillary: 96 mg/dL (ref 70–99)

## 2023-05-22 LAB — HEMOGLOBIN A1C
Hgb A1c MFr Bld: 10.4 % — ABNORMAL HIGH (ref 4.8–5.6)
Mean Plasma Glucose: 251.78 mg/dL

## 2023-05-22 LAB — ECHOCARDIOGRAM COMPLETE
Area-P 1/2: 2.95 cm2
Calc EF: 42.1 %
Height: 70 in
S' Lateral: 4.3 cm
Single Plane A2C EF: 38.6 %
Single Plane A4C EF: 44.2 %
Weight: 3174.62 [oz_av]

## 2023-05-22 LAB — CBG MONITORING, ED: Glucose-Capillary: 389 mg/dL — ABNORMAL HIGH (ref 70–99)

## 2023-05-22 LAB — MAGNESIUM: Magnesium: 1.6 mg/dL — ABNORMAL LOW (ref 1.7–2.4)

## 2023-05-22 LAB — PROCALCITONIN: Procalcitonin: 0.19 ng/mL

## 2023-05-22 MED ORDER — AMLODIPINE BESYLATE 5 MG PO TABS
10.0000 mg | ORAL_TABLET | Freq: Every morning | ORAL | Status: DC
  Administered 2023-05-22 – 2023-05-23 (×2): 10 mg via ORAL
  Filled 2023-05-22 (×2): qty 2

## 2023-05-22 MED ORDER — INSULIN GLARGINE-YFGN 100 UNIT/ML ~~LOC~~ SOLN
50.0000 [IU] | Freq: Every day | SUBCUTANEOUS | Status: DC
  Administered 2023-05-22 – 2023-05-23 (×2): 50 [IU] via SUBCUTANEOUS
  Filled 2023-05-22 (×3): qty 0.5

## 2023-05-22 MED ORDER — CARVEDILOL 12.5 MG PO TABS
25.0000 mg | ORAL_TABLET | Freq: Two times a day (BID) | ORAL | Status: DC
  Administered 2023-05-22 – 2023-05-23 (×3): 25 mg via ORAL
  Filled 2023-05-22 (×3): qty 2

## 2023-05-22 MED ORDER — ACETAMINOPHEN 650 MG RE SUPP: 650.0000 mg | Freq: Four times a day (QID) | RECTAL | Status: DC | PRN

## 2023-05-22 MED ORDER — LACTATED RINGERS IV BOLUS
500.0000 mL | Freq: Once | INTRAVENOUS | Status: AC
  Administered 2023-05-22: 500 mL via INTRAVENOUS

## 2023-05-22 MED ORDER — PANTOPRAZOLE SODIUM 40 MG PO TBEC
40.0000 mg | DELAYED_RELEASE_TABLET | Freq: Every day | ORAL | Status: DC
  Administered 2023-05-22 – 2023-05-23 (×2): 40 mg via ORAL
  Filled 2023-05-22 (×2): qty 1

## 2023-05-22 MED ORDER — DOXYCYCLINE HYCLATE 100 MG PO TABS
100.0000 mg | ORAL_TABLET | Freq: Two times a day (BID) | ORAL | Status: DC
  Administered 2023-05-22 – 2023-05-23 (×2): 100 mg via ORAL
  Filled 2023-05-22 (×2): qty 1

## 2023-05-22 MED ORDER — INSULIN ASPART 100 UNIT/ML IJ SOLN: 0.0000 [IU] | Freq: Every day | INTRAMUSCULAR | Status: DC

## 2023-05-22 MED ORDER — IOHEXOL 350 MG/ML SOLN
75.0000 mL | Freq: Once | INTRAVENOUS | Status: AC | PRN
  Administered 2023-05-22: 75 mL via INTRAVENOUS

## 2023-05-22 MED ORDER — ISOSORBIDE MONONITRATE ER 60 MG PO TB24
120.0000 mg | ORAL_TABLET | Freq: Every day | ORAL | Status: DC
  Administered 2023-05-22: 120 mg via ORAL
  Filled 2023-05-22: qty 2

## 2023-05-22 MED ORDER — NITROGLYCERIN 2 % TD OINT
1.0000 [in_us] | TOPICAL_OINTMENT | Freq: Once | TRANSDERMAL | Status: AC
  Administered 2023-05-22: 1 [in_us] via TOPICAL
  Filled 2023-05-22: qty 1

## 2023-05-22 MED ORDER — EMPAGLIFLOZIN 25 MG PO TABS
25.0000 mg | ORAL_TABLET | Freq: Every day | ORAL | Status: DC
  Administered 2023-05-22 – 2023-05-23 (×2): 25 mg via ORAL
  Filled 2023-05-22 (×4): qty 1

## 2023-05-22 MED ORDER — LACTATED RINGERS IV BOLUS (SEPSIS)
1000.0000 mL | Freq: Once | INTRAVENOUS | Status: AC
Start: 2023-05-22 — End: 2023-05-22
  Administered 2023-05-22: 1000 mL via INTRAVENOUS

## 2023-05-22 MED ORDER — FENTANYL CITRATE PF 50 MCG/ML IJ SOSY
12.5000 ug | PREFILLED_SYRINGE | INTRAMUSCULAR | Status: DC | PRN
Start: 2023-05-22 — End: 2023-05-23

## 2023-05-22 MED ORDER — LACTATED RINGERS IV SOLN
INTRAVENOUS | Status: DC
Start: 2023-05-22 — End: 2023-05-22

## 2023-05-22 MED ORDER — ENOXAPARIN SODIUM 40 MG/0.4ML IJ SOSY
40.0000 mg | PREFILLED_SYRINGE | INTRAMUSCULAR | Status: DC
  Administered 2023-05-22 – 2023-05-23 (×2): 40 mg via SUBCUTANEOUS
  Filled 2023-05-22 (×2): qty 0.4

## 2023-05-22 MED ORDER — ASPIRIN 81 MG PO TBEC
81.0000 mg | DELAYED_RELEASE_TABLET | Freq: Every morning | ORAL | Status: DC
  Administered 2023-05-22 – 2023-05-23 (×2): 81 mg via ORAL
  Filled 2023-05-22 (×2): qty 1

## 2023-05-22 MED ORDER — INSULIN ASPART 100 UNIT/ML IJ SOLN
0.0000 [IU] | Freq: Three times a day (TID) | INTRAMUSCULAR | Status: DC
  Administered 2023-05-22: 15 [IU] via SUBCUTANEOUS
  Administered 2023-05-22: 11 [IU] via SUBCUTANEOUS
  Administered 2023-05-23: 8 [IU] via SUBCUTANEOUS
  Administered 2023-05-23: 3 [IU] via SUBCUTANEOUS
  Filled 2023-05-22: qty 1

## 2023-05-22 MED ORDER — LEVOFLOXACIN IN D5W 750 MG/150ML IV SOLN: 750.0000 mg | Freq: Once | INTRAVENOUS | Status: DC

## 2023-05-22 MED ORDER — ONDANSETRON HCL 4 MG PO TABS: 4.0000 mg | ORAL_TABLET | Freq: Four times a day (QID) | ORAL | Status: DC | PRN

## 2023-05-22 MED ORDER — LOSARTAN POTASSIUM 50 MG PO TABS
100.0000 mg | ORAL_TABLET | Freq: Every morning | ORAL | Status: DC
  Administered 2023-05-22 – 2023-05-23 (×2): 100 mg via ORAL
  Filled 2023-05-22 (×2): qty 2

## 2023-05-22 MED ORDER — NITROGLYCERIN 0.4 MG SL SUBL: 0.4000 mg | SUBLINGUAL_TABLET | SUBLINGUAL | Status: DC | PRN

## 2023-05-22 MED ORDER — OXYCODONE HCL 5 MG PO TABS: 5.0000 mg | ORAL_TABLET | Freq: Four times a day (QID) | ORAL | Status: DC | PRN

## 2023-05-22 MED ORDER — INSULIN ASPART 100 UNIT/ML IJ SOLN
12.0000 [IU] | Freq: Three times a day (TID) | INTRAMUSCULAR | Status: DC
  Administered 2023-05-22 – 2023-05-23 (×4): 12 [IU] via SUBCUTANEOUS

## 2023-05-22 MED ORDER — SODIUM CHLORIDE 0.9 % IV SOLN
500.0000 mg | Freq: Once | INTRAVENOUS | Status: AC
  Administered 2023-05-22: 500 mg via INTRAVENOUS
  Filled 2023-05-22: qty 5

## 2023-05-22 MED ORDER — FENOFIBRATE 160 MG PO TABS
160.0000 mg | ORAL_TABLET | Freq: Every day | ORAL | Status: DC
  Administered 2023-05-22 – 2023-05-23 (×2): 160 mg via ORAL
  Filled 2023-05-22 (×2): qty 1

## 2023-05-22 MED ORDER — PERFLUTREN LIPID MICROSPHERE
1.0000 mL | INTRAVENOUS | Status: AC | PRN
  Administered 2023-05-22: 3 mL via INTRAVENOUS

## 2023-05-22 MED ORDER — ACETAMINOPHEN 325 MG PO TABS: 650.0000 mg | ORAL_TABLET | Freq: Four times a day (QID) | ORAL | Status: DC | PRN

## 2023-05-22 MED ORDER — SODIUM CHLORIDE 0.9 % IV SOLN
2.0000 g | Freq: Once | INTRAVENOUS | Status: AC
  Administered 2023-05-22: 2 g via INTRAVENOUS
  Filled 2023-05-22: qty 20

## 2023-05-22 MED ORDER — POTASSIUM CHLORIDE 10 MEQ/100ML IV SOLN
10.0000 meq | INTRAVENOUS | Status: AC
  Filled 2023-05-22: qty 100

## 2023-05-22 MED ORDER — ONDANSETRON HCL 4 MG/2ML IJ SOLN: 4.0000 mg | Freq: Four times a day (QID) | INTRAMUSCULAR | Status: DC | PRN

## 2023-05-22 MED ORDER — FUROSEMIDE 10 MG/ML IJ SOLN
40.0000 mg | Freq: Once | INTRAMUSCULAR | Status: AC
  Administered 2023-05-22: 40 mg via INTRAVENOUS
  Filled 2023-05-22: qty 4

## 2023-05-22 MED ORDER — ATORVASTATIN CALCIUM 40 MG PO TABS
80.0000 mg | ORAL_TABLET | Freq: Every day | ORAL | Status: DC
  Administered 2023-05-22: 80 mg via ORAL
  Filled 2023-05-22: qty 2

## 2023-05-22 MED ORDER — POTASSIUM CHLORIDE 10 MEQ/100ML IV SOLN
10.0000 meq | INTRAVENOUS | Status: AC
  Administered 2023-05-22 (×3): 10 meq via INTRAVENOUS
  Filled 2023-05-22 (×2): qty 100

## 2023-05-22 MED ORDER — LABETALOL HCL 5 MG/ML IV SOLN: 10.0000 mg | INTRAVENOUS | Status: DC | PRN

## 2023-05-22 MED ORDER — LACTATED RINGERS IV BOLUS: 1000.0000 mL | Freq: Once | INTRAVENOUS | Status: DC

## 2023-05-22 MED ORDER — IPRATROPIUM-ALBUTEROL 0.5-2.5 (3) MG/3ML IN SOLN: 3.0000 mL | RESPIRATORY_TRACT | Status: DC | PRN

## 2023-05-22 MED ORDER — FUROSEMIDE 10 MG/ML IJ SOLN
30.0000 mg | Freq: Once | INTRAMUSCULAR | Status: AC
  Administered 2023-05-22: 30 mg via INTRAVENOUS
  Filled 2023-05-22: qty 4

## 2023-05-22 MED ORDER — ALLOPURINOL 100 MG PO TABS
100.0000 mg | ORAL_TABLET | Freq: Every morning | ORAL | Status: DC
  Administered 2023-05-22 – 2023-05-23 (×2): 100 mg via ORAL
  Filled 2023-05-22 (×2): qty 1

## 2023-05-22 NOTE — Progress Notes (Signed)
*  PRELIMINARY RESULTS* Echocardiogram 2D Echocardiogram has been performed with Definity.  Stacey Drain 05/22/2023, 10:22 AM

## 2023-05-22 NOTE — ED Notes (Signed)
CBG 389. Nurse notified.

## 2023-05-22 NOTE — Plan of Care (Signed)
  Problem: Education: Goal: Knowledge of General Education information will improve Description: Including pain rating scale, medication(s)/side effects and non-pharmacologic comfort measures Outcome: Progressing   Problem: Health Behavior/Discharge Planning: Goal: Ability to manage health-related needs will improve Outcome: Progressing   Problem: Clinical Measurements: Goal: Ability to maintain clinical measurements within normal limits will improve Outcome: Progressing Goal: Will remain free from infection Outcome: Progressing Goal: Diagnostic test results will improve Outcome: Progressing Goal: Respiratory complications will improve Outcome: Progressing Goal: Cardiovascular complication will be avoided Outcome: Progressing   Problem: Activity: Goal: Risk for activity intolerance will decrease Outcome: Progressing   Problem: Nutrition: Goal: Adequate nutrition will be maintained Outcome: Progressing   Problem: Coping: Goal: Level of anxiety will decrease Outcome: Progressing   Problem: Elimination: Goal: Will not experience complications related to bowel motility Outcome: Progressing Goal: Will not experience complications related to urinary retention Outcome: Progressing   Problem: Pain Management: Goal: General experience of comfort will improve Outcome: Progressing   Problem: Safety: Goal: Ability to remain free from injury will improve Outcome: Progressing   Problem: Skin Integrity: Goal: Risk for impaired skin integrity will decrease Outcome: Progressing   Problem: Education: Goal: Ability to describe self-care measures that may prevent or decrease complications (Diabetes Survival Skills Education) will improve Outcome: Progressing Goal: Individualized Educational Video(s) Outcome: Progressing   Problem: Coping: Goal: Ability to adjust to condition or change in health will improve Outcome: Progressing   Problem: Fluid Volume: Goal: Ability to  maintain a balanced intake and output will improve Outcome: Progressing   Problem: Health Behavior/Discharge Planning: Goal: Ability to identify and utilize available resources and services will improve Outcome: Progressing Goal: Ability to manage health-related needs will improve Outcome: Progressing   Problem: Metabolic: Goal: Ability to maintain appropriate glucose levels will improve Outcome: Progressing   Problem: Nutritional: Goal: Maintenance of adequate nutrition will improve Outcome: Progressing Goal: Progress toward achieving an optimal weight will improve Outcome: Progressing   Problem: Skin Integrity: Goal: Risk for impaired skin integrity will decrease Outcome: Progressing   Problem: Tissue Perfusion: Goal: Adequacy of tissue perfusion will improve Outcome: Progressing   Problem: Education: Goal: Ability to demonstrate management of disease process will improve Outcome: Progressing Goal: Ability to verbalize understanding of medication therapies will improve Outcome: Progressing Goal: Individualized Educational Video(s) Outcome: Progressing   Problem: Activity: Goal: Capacity to carry out activities will improve Outcome: Progressing   Problem: Cardiac: Goal: Ability to achieve and maintain adequate cardiopulmonary perfusion will improve Outcome: Progressing

## 2023-05-22 NOTE — Progress Notes (Signed)
Patient taken off of BiPAP and placed on 4.5L nasal cannula. Patient states he feels his breathing is much improved compared to last night. BiPAP is left at bedside if needed. All vitals WNL.

## 2023-05-22 NOTE — Plan of Care (Signed)
  Problem: Activity: Goal: Risk for activity intolerance will decrease Outcome: Progressing   Problem: Coping: Goal: Level of anxiety will decrease Outcome: Progressing   Problem: Pain Management: Goal: General experience of comfort will improve Outcome: Progressing

## 2023-05-22 NOTE — ED Notes (Signed)
Patient called out for having chest pain and needing to have a bowel movement. Patient was assisted to the bathroom in the room. Patient had explosive bowel. Once finished patient stood and leaned over to hold on to the rail complaining of not being able breathe. The patient was assisted back to the stretcher. Patient was placed on the monitor presenting short of breathe and tachycardia. Patient was placed on oxygen and ED provider notified.

## 2023-05-22 NOTE — Sepsis Progress Note (Addendum)
Elink monitoring for the code sepsis protocol.   0345: Sonnie Alamo, MD and bedside RN about need to order a third LA since second went up.

## 2023-05-22 NOTE — Progress Notes (Signed)
Patient placed on full face mask BIPAP 16/8 50% and tolerating well at this time.

## 2023-05-22 NOTE — Hospital Course (Signed)
77 year old gentleman former smoker with history of coronary artery disease status post angioplasty and stent placement, mild OSA not on CPAP, renal artery stenosis, peripheral vascular disease, history of NSTEMI, chronic heart failure reduced EF, type 2 diabetes mellitus with vascular complications, hyperlipidemia, history of pulmonary edema, history of pneumonia presented to the emergency department after having a syncopal episode when he stood up at home last night.  He was having difficulty breathing just prior to passing out.  His wife reported that she noted he was breathing fast and complaining of shortness of breath prior to the incident.  Over the past several days he has had increasing cough and chest congestion.  He denies having chest pain.  He denies fever and chills.  Wife reported that he appeared to lose consciousness for about 10 minutes.  No seizure reported.  He improved after EMS arrived.  Patient has no memory of the event.  He was noted in the ED to have a blood glucose of 410.  His WBC was elevated at 15.  He had an elevated lactic acid and was bolused with IV fluids.  He subsequently developed pulmonary edema and treated with IV Lasix.  He was started on IV antibiotics.  He was started on BiPAP therapy further management.  And Nitropaste Nitropaste.  His blood pressures were markedly elevated and he had crackles on chest exam.  CT chest negative for PE.  Trauma workup was unrevealing for acute injury.  Hypodense left thyroid nodule noted on CT.  Patient admitted for further management.

## 2023-05-22 NOTE — ED Notes (Signed)
Pt returned from radiology. B Douglass Dunshee RN

## 2023-05-22 NOTE — ED Notes (Signed)
Called wife and gave her an update per request

## 2023-05-22 NOTE — H&P (Addendum)
History and Physical  [facility]  [name] [name] UEA:[id number] DOB: [birth year] DOA: 05/21/2023  PCP: Rebekah Chesterfield, NP  Patient coming from: Home by [facility]  Level of care: Telemetry  I have personally briefly reviewed patient's old medical records in [facility]  Chief Complaint: passed out   HPI: [name] [name] is a 77 year old gentleman former smoker with history of coronary artery disease status post angioplasty and stent placement, mild OSA not on CPAP, renal artery stenosis, peripheral vascular disease, history of NSTEMI, chronic heart failure reduced EF, type 2 diabetes mellitus with vascular complications, hyperlipidemia, history of pulmonary edema, history of pneumonia presented to the emergency department after having a syncopal episode when he stood up at home last night.  He was having difficulty breathing just prior to passing out.  His wife reported that she noted he was breathing fast and complaining of shortness of breath prior to the incident.  Over the past several days he has had increasing cough and chest congestion.  He denies having chest pain.  He denies fever and chills.  Wife reported that he appeared to lose consciousness for about 10 minutes.  No seizure reported.  He improved after EMS arrived.  Patient has no memory of the event.  He was noted in the ED to have a blood glucose of 410.  His WBC was elevated at 15.  He had an elevated lactic acid and was bolused with IV fluids.  He subsequently developed pulmonary edema and treated with IV Lasix.  He was started on IV antibiotics.  He was started on BiPAP therapy further management.  And Nitropaste Nitropaste.  His blood pressures were markedly elevated and he had crackles on chest exam.  CT chest negative for PE.  Trauma workup was unrevealing for acute injury.  Hypodense left thyroid nodule noted on CT.  Patient admitted for further management.    Past Medical History:  Diagnosis Date   CAP (community  acquired pneumonia) 10/11/2017   Coronary artery disease    a. Cath: 2000 nonobs dz. b. 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 s/p BMS to LAD & RCA ; s/p PCI/DES dRCA & PDA x2 2011. d. cath 02/17/14: LM patent, LAD calcified, diffuse irregs, no high grade stenosis mLAD stent patent, LCx, diffuse irregs no high-grade dz, RCA total occ w/ L to R collats, medical Rx rec   Diabetes mellitus    Diastolic dysfunction    a. echo 02/18/14: EF 45-50%, mild LVH, AK of inferior and inferoseptal myocardium, GR1DD   Hiatal hernia    Hyperlipidemia    Hypertension    LV dysfunction    NSTEMI (non-ST elevated myocardial infarction) (HCC) 10/07/2017   PVD (peripheral vascular disease) (HCC)    Renal artery stenosis (HCC)    Mild, bilateral   S/P angioplasty with stent, atherectomy DES to pLAD and DES to mLAD 10/10/17  10/11/2017   Sleep apnea    Mild    Past Surgical History:  Procedure Laterality Date   ABDOMINAL AORTOGRAM W/LOWER EXTREMITY N/A 07/21/2020   Procedure: ABDOMINAL AORTOGRAM W/LOWER EXTREMITY;  Surgeon: [name], MD;  Location: [facility] INVASIVE CV LAB;  Service: Cardiovascular;  Laterality: N/A;   ABDOMINAL AORTOGRAM W/LOWER EXTREMITY Left 03/14/2023   Procedure: ABDOMINAL AORTOGRAM W/LOWER EXTREMITY;  Surgeon: Victorino Sparrow, MD;  Location: [facility] INVASIVE CV LAB;  Service: Cardiovascular;  Laterality: Left;   CATARACT EXTRACTION     CORONARY ATHERECTOMY N/A 10/10/2017  Procedure: CORONARY ATHERECTOMY - CSI;  Surgeon: Yvonne Kendall, MD;  Location: [facility] INVASIVE CV LAB;  Service: Cardiovascular;  Laterality: N/A;   CORONARY STENT INTERVENTION N/A 10/10/2017   Procedure: CORONARY STENT INTERVENTION;  Surgeon: Yvonne Kendall, MD;  Location: [facility] INVASIVE CV LAB;  Service: Cardiovascular;  Laterality: N/A;   CORONARY STENT INTERVENTION N/A 07/21/2018   Procedure: CORONARY STENT INTERVENTION;  Surgeon: Yvonne Kendall, MD;  Location: [facility] INVASIVE CV LAB;  Service:  Cardiovascular;  Laterality: N/A;   CORONARY ULTRASOUND/IVUS N/A 10/09/2017   Procedure: Intravascular Ultrasound/IVUS;  Surgeon: [name], MD;  Location: [facility] INVASIVE CV LAB;  Service: Cardiovascular;  Laterality: N/A;   CORONARY ULTRASOUND/IVUS N/A 07/21/2018   Procedure: Intravascular Ultrasound/IVUS;  Surgeon: Yvonne Kendall, MD;  Location: [facility] INVASIVE CV LAB;  Service: Cardiovascular;  Laterality: N/A;   FEMORAL-POPLITEAL BYPASS GRAFT Left 07/27/2020   Procedure: LEFT FEMORAL TO BELOW KNEE POPLITEAL ARTERY BYPASS;  Surgeon: [name], MD;  Location: [facility] OR;  Service: Vascular;  Laterality: Left;   HIATAL HERNIA REPAIR     LEFT HEART CATH AND CORONARY ANGIOGRAPHY N/A 10/09/2017   Procedure: LEFT HEART CATH AND CORONARY ANGIOGRAPHY;  Surgeon: [name], MD;  Location: [facility] INVASIVE CV LAB;  Service: Cardiovascular;  Laterality: N/A;   LEFT HEART CATH AND CORONARY ANGIOGRAPHY N/A 07/21/2018   Procedure: LEFT HEART CATH AND CORONARY ANGIOGRAPHY;  Surgeon: Yvonne Kendall, MD;  [facility]: [facility] CV LAB;  Service: Cardiovascular;  Laterality: N/A;   LEFT HEART CATHETERIZATION WITH CORONARY ANGIOGRAM N/A 02/17/2014   Procedure: LEFT HEART CATHETERIZATION WITH CORONARY ANGIOGRAM;  Surgeon: Micheline Chapman, MD;  Location: [facility] CATH LAB;  Service: Cardiovascular;  Laterality: N/A;   SHOULDER ARTHROSCOPY     Right   SOFT TISSUE TUMOR RESECTION     Vocal cord tumor, abdominal   SOFT TISSUE TUMOR RESECTION     Benign neck tumor   VEIN HARVEST Left 07/27/2020   Procedure: VEIN HARVEST USING  GREATER SAPHENOUS VEIN;  Surgeon: [name], MD;  Location: [facility] OR;  Service: Vascular;  Laterality: Left;   Vocal cord polyp removal       reports that he quit smoking about 19 years ago. His smoking use included cigarettes. He has never been exposed to tobacco smoke. He has never used smokeless tobacco. He reports that he does not drink alcohol and does not use drugs.  Allergies   Allergen Reactions   Bee Venom Swelling and Other (See Comments)   Other Other (See Comments)   Penicillin G Other (See Comments)   Penicillins Itching and Swelling    Itching, swelling of the face/tongue/throat, SOB, or low BP   Plavix [Clopidogrel Bisulfate] Other (See Comments)    Patient is noted to be a nonresponder to Plavix    Family History  Problem Relation Age of Onset   Heart attack Father 72   Cancer Mother    Cancer Brother     Prior to Admission medications   Medication Sig Start Date End Date Taking? Authorizing Provider  allopurinol (ZYLOPRIM) 100 MG tablet Take 100 mg by mouth every morning.   Yes [provider]  amLODipine (NORVASC) 10 MG tablet Take 1 tablet (10 mg total) by mouth daily. Patient taking differently: Take 10 mg by mouth every morning. 09/24/12  Yes Rollene Rotunda, MD  aspirin 81 MG tablet Take 81 mg by mouth every morning.   Yes [provider]  atorvastatin (LIPITOR) 80 MG tablet TAKE 1 TABLET BY  MOUTH EVERY DAY Patient taking differently: Take 80 mg by mouth at bedtime. 08/12/19  Yes Iran Ouch, MD  carvedilol (COREG) 25 MG tablet TAKE 1 TABLET BY MOUTH TWO TIMES DAILY WITH A MEAL Patient taking differently: Take 25 mg by mouth in the morning and at bedtime. 08/12/19  Yes Iran Ouch, MD  chlorthalidone (HYGROTON) 25 MG tablet TAKE 1 TABLET BY MOUTH EVERY DAY 08/02/22  Yes Rollene Rotunda, MD  empagliflozin (JARDIANCE) 25 MG TABS tablet Take 25 mg by mouth daily.   Yes [provider]  fenofibrate (TRICOR) 145 MG tablet TAKE 1 TABLET BY MOUTH EVERY DAY 07/19/21  Yes Rollene Rotunda, MD  isosorbide mononitrate (IMDUR) 120 MG 24 hr tablet Take 1 tablet (120 mg total) by mouth daily. Patient taking differently: Take 120 mg by mouth at bedtime. 11/20/21  Yes Rollene Rotunda, MD  losartan (COZAAR) 100 MG tablet Take 100 mg by mouth every morning.    Yes [provider]  metFORMIN (GLUCOPHAGE-XR) 500 MG 24 hr  tablet Take 500 mg by mouth 2 (two) times daily.   Yes [provider]  nitroGLYCERIN (NITROSTAT) 0.4 MG SL tablet Place 1 tablet (0.4 mg total) under the tongue every 5 (five) minutes as needed for chest pain. 05/09/22  Yes Rollene Rotunda, MD  omeprazole (PRILOSEC) 20 MG capsule Take 20 mg by mouth every morning.    Yes [provider]  TOUJEO MAX SOLOSTAR 300 UNIT/ML Solostar Pen Inject 100 Units into the skin daily. 03/01/22  Yes [provider]    Physical Exam: Vitals:   05/22/23 0738 05/22/23 0810 05/22/23 1000 05/22/23 1155  BP: (!) 171/88  (!) 142/82 (!) 159/81  Pulse: (!) 108  95 97  Resp: 20  13 16   Temp:  98.6 F (37 C) 98.6 F (37 C) 98.6 F (37 C)  TempSrc:  Axillary  Oral  SpO2: 97%  91% 100%  Weight:   84.8 kg   Height:        Constitutional: NAD, calm, comfortable on nasal cannula oxygen Eyes: PERRL, lids and conjunctivae normal ENMT: Mucous membranes are moist. Posterior pharynx clear of any exudate or lesions.Normal dentition.  Neck: normal, supple, no masses, no thyromegaly Respiratory: diffuse crackles. Normal respiratory effort. No accessory muscle use.  Cardiovascular: normal s1, s2 sounds, 1+ lower extremity edema. 2+ pedal pulses.   Abdomen: no tenderness, no masses palpated. No hepatosplenomegaly. Bowel sounds positive.  Musculoskeletal: no clubbing / cyanosis. No joint deformity upper and lower extremities. Good ROM, no contractures. Normal muscle tone.  Skin: no rashes, lesions, ulcers. No induration Neurologic: CN 2-12 grossly intact. Sensation intact, DTR normal. Strength 5/5 in all 4.  Psychiatric: Normal judgment and insight. Alert and oriented x 3. Normal mood.   Labs on Admission: I have personally reviewed following labs and imaging studies  CBC: Recent Labs  Lab 05/21/23 2202 05/22/23 0712  WBC 15.1* 16.7*  HGB 14.8 15.3  HCT 44.5 45.7  MCV 86.4 86.2  PLT 190 216   Basic Metabolic Panel: Recent Labs  Lab  05/21/23 2202 05/22/23 0712  NA 136 137  K 3.4* 3.3*  CL 97* 100  CO2 26 25  GLUCOSE 410* 417*  BUN 18 16  CREATININE 1.02 0.78  CALCIUM 8.5* 8.3*  MG  --  1.6*   GFR: Estimated Creatinine Clearance: 82.4 mL/min (by C-G formula based on SCr of 0.78 mg/dL). Liver Function Tests: No results for input(s): "AST", "ALT", "ALKPHOS", "BILITOT", "PROT", "ALBUMIN" in  the last 168 hours. No results for input(s): "LIPASE", "AMYLASE" in the last 168 hours. No results for input(s): "AMMONIA" in the last 168 hours. Coagulation Profile: No results for input(s): "INR", "PROTIME" in the last 168 hours. Cardiac Enzymes: No results for input(s): "CKTOTAL", "CKMB", "CKMBINDEX", "TROPONINI" in the last 168 hours. BNP (last 3 results) No results for input(s): "PROBNP" in the last 8760 hours. HbA1C: No results for input(s): "HGBA1C" in the last 72 hours. CBG: Recent Labs  Lab 05/21/23 2204 05/22/23 0826  GLUCAP 431* 389*   Lipid Profile: No results for input(s): "CHOL", "HDL", "LDLCALC", "TRIG", "CHOLHDL", "LDLDIRECT" in the last 72 hours. Thyroid Function Tests: No results for input(s): "TSH", "T4TOTAL", "FREET4", "T3FREE", "THYROIDAB" in the last 72 hours. Anemia Panel: No results for input(s): "VITAMINB12", "FOLATE", "FERRITIN", "TIBC", "IRON", "RETICCTPCT" in the last 72 hours. Urine analysis:    Component Value Date/Time   COLORURINE STRAW (A) 05/21/2023 2338   APPEARANCEUR CLEAR 05/21/2023 2338   LABSPEC 1.024 05/21/2023 2338   PHURINE 6.0 05/21/2023 2338   GLUCOSEU >=500 (A) 05/21/2023 2338   HGBUR SMALL (A) 05/21/2023 2338   BILIRUBINUR NEGATIVE 05/21/2023 2338   KETONESUR NEGATIVE 05/21/2023 2338   PROTEINUR >=300 (A) 05/21/2023 2338   UROBILINOGEN 0.2 06/12/2010 1605   NITRITE NEGATIVE 05/21/2023 2338   LEUKOCYTESUR NEGATIVE 05/21/2023 2338    Radiological Exams on Admission: DG Chest Port 1 View  Result Date: 05/22/2023 CLINICAL DATA:  Acute heart failure. EXAM:  PORTABLE CHEST 1 VIEW COMPARISON:  Same day. FINDINGS: Stable cardiomediastinal silhouette. Stable bilateral pulmonary edema. Stable bibasilar atelectasis. Bony thorax is unremarkable. IMPRESSION: Stable bilateral pulmonary edema and bibasilar atelectasis. Electronically Signed   By: Lupita Raider M.D.   On: 05/22/2023 08:16   DG Chest Portable 1 View  Result Date: 05/22/2023 CLINICAL DATA:  Shortness of breath. EXAM: PORTABLE CHEST 1 VIEW COMPARISON:  May 21, 2023 FINDINGS: The cardiac silhouette is mildly enlarged and unchanged in size. There is marked severity calcification of the thoracic aorta. Marked severity diffusely increased interstitial lung markings are seen with prominence of the central pulmonary vasculature. This is increased in severity when compared to the prior study. Mild atelectatic changes are seen within the bilateral lung bases. No pleural effusion or pneumothorax is identified. Multilevel degenerative changes are noted throughout the thoracic spine. IMPRESSION: 1. Cardiomegaly with marked severity interstitial edema. 2. Mild bibasilar atelectasis. Electronically Signed   By: Aram Candela M.D.   On: 05/22/2023 03:10   CT Angio Chest PE W and/or Wo Contrast  Result Date: 05/22/2023 CLINICAL DATA:  Pulmonary embolus suspected with high probability. Syncope when standing. Difficulty breathing before passing out. EXAM: CT ANGIOGRAPHY CHEST WITH CONTRAST TECHNIQUE: Multidetector CT imaging of the chest was performed using the standard protocol during bolus administration of intravenous contrast. Multiplanar CT image reconstructions and MIPs were obtained to evaluate the vascular anatomy. RADIATION DOSE REDUCTION: This exam was performed according to the departmental dose-optimization program which includes automated exposure control, adjustment of the mA and/or kV according to patient size and/or use of iterative reconstruction technique. CONTRAST:  75mL OMNIPAQUE IOHEXOL 350  MG/ML SOLN COMPARISON:  Chest radiograph 05/21/2023.  CT chest 10/07/2017 FINDINGS: Cardiovascular: Technically adequate study with good opacification of the central and segmental pulmonary arteries. Mild motion artifact. No focal filling defects. No evidence of significant pulmonary embolus. Mild cardiac enlargement. No pericardial effusions. Normal caliber thoracic aorta. Diffuse calcification of the aorta and coronary arteries. Mediastinum/Nodes: 1.7 cm diameter hypodense thyroid nodule in the  left, increasing since prior study. Esophagus is decompressed. Mediastinal lymph nodes are enlarged with pretracheal nodes measuring up to 1.5 cm diameter, left aortopulmonic window lymph nodes measuring 8 mm diameter, and subcarinal nodes measuring 1.3 cm diameter. Lymph nodes are similar to prior study, likely reactive. Lungs/Pleura: Emphysematous changes throughout the lungs. Scattered peripheral fibrosis. Bronchial wall thickening and central interstitial changes consistent with chronic bronchitis. No airspace disease or consolidation. No pleural effusion or pneumothorax. Upper Abdomen: No acute abnormalities demonstrated. Musculoskeletal: Degenerative changes in the spine. Old left rib fractures. Review of the MIP images confirms the above findings. IMPRESSION: 1. No evidence of significant pulmonary embolus. 2. Emphysematous changes and fibrosis in the lungs. No evidence of active pulmonary disease. 3. Enlarging 1.7 cm diameter thyroid nodule in the left. Recommend follow-up with elective thyroid US (ref: J Am Coll Radiol. 2015 Feb;12(2): 143-50). 4. Prominent mediastinal lymph nodes without change, likely reactive. Electronically Signed   By: Burman Nieves M.D.   On: 05/22/2023 01:16   DG Chest Port 1 View  Result Date: 05/22/2023 CLINICAL DATA:  Questionable sepsis. EXAM: PORTABLE CHEST 1 VIEW COMPARISON:  April 27, 2022 FINDINGS: The cardiac silhouette is mildly enlarged and unchanged in size. There is  marked severity calcification of the thoracic aorta. Stable, diffuse, mild to moderate severity increased interstitial lung markings are noted. There is no evidence of an acute infiltrate, pleural effusion or pneumothorax. Multilevel degenerative changes are noted throughout the thoracic spine. IMPRESSION: Stable cardiomegaly with chronic appearing increased interstitial lung markings. Electronically Signed   By: Aram Candela M.D.   On: 05/22/2023 00:24   CT Head Wo Contrast  Result Date: 05/22/2023 CLINICAL DATA:  Syncope EXAM: CT HEAD WITHOUT CONTRAST CT CERVICAL SPINE WITHOUT CONTRAST TECHNIQUE: Multidetector CT imaging of the head and cervical spine was performed following the standard protocol without intravenous contrast. Multiplanar CT image reconstructions of the cervical spine were also generated. RADIATION DOSE REDUCTION: This exam was performed according to the departmental dose-optimization program which includes automated exposure control, adjustment of the mA and/or kV according to patient size and/or use of iterative reconstruction technique. COMPARISON:  CT brain 04/27/2022 FINDINGS: CT HEAD FINDINGS Brain: No acute territorial infarction, hemorrhage or intracranial mass. Mild atrophy. Stable ventricle size. Prominent perivascular space versus chronic lacunar infarct in the left basal ganglia. Vascular: No hyperdense vessels.  Carotid vascular calcification Skull: Normal. Negative for fracture or focal lesion. Sinuses/Orbits: No acute finding. Other: None CT CERVICAL SPINE FINDINGS Alignment: No malalignment.  Facet alignment is normal Skull base and vertebrae: No acute fracture. No primary bone lesion or focal pathologic process. Soft tissues and spinal canal: No prevertebral fluid or swelling. No visible canal hematoma. Disc levels: Mild disc space narrowing at C2-C3 and C7-T1. Mild multilevel facet degenerative changes Upper chest: Lung apices are clear. 1.8 cm hypodense left thyroid  nodule Other: Choose 1 IMPRESSION: 1. No CT evidence for acute intracranial abnormality. Mild atrophy. 2. Degenerative changes of the cervical spine. No acute osseous abnormality. 3. 1.8 cm hypodense left thyroid nodule. Recommend thyroid US (ref: J Am Coll Radiol. 2015 Feb;12(2): 143-50).This should be performed on a nonemergent basis Electronically Signed   By: Jasmine Pang M.D.   On: 05/22/2023 00:04   CT Cervical Spine Wo Contrast  Result Date: 05/22/2023 CLINICAL DATA:  Syncope EXAM: CT HEAD WITHOUT CONTRAST CT CERVICAL SPINE WITHOUT CONTRAST TECHNIQUE: Multidetector CT imaging of the head and cervical spine was performed following the standard protocol without intravenous contrast. Multiplanar CT image  reconstructions of the cervical spine were also generated. RADIATION DOSE REDUCTION: This exam was performed according to the departmental dose-optimization program which includes automated exposure control, adjustment of the mA and/or kV according to patient size and/or use of iterative reconstruction technique. COMPARISON:  CT brain 04/27/2022 FINDINGS: CT HEAD FINDINGS Brain: No acute territorial infarction, hemorrhage or intracranial mass. Mild atrophy. Stable ventricle size. Prominent perivascular space versus chronic lacunar infarct in the left basal ganglia. Vascular: No hyperdense vessels.  Carotid vascular calcification Skull: Normal. Negative for fracture or focal lesion. Sinuses/Orbits: No acute finding. Other: None CT CERVICAL SPINE FINDINGS Alignment: No malalignment.  Facet alignment is normal Skull base and vertebrae: No acute fracture. No primary bone lesion or focal pathologic process. Soft tissues and spinal canal: No prevertebral fluid or swelling. No visible canal hematoma. Disc levels: Mild disc space narrowing at C2-C3 and C7-T1. Mild multilevel facet degenerative changes Upper chest: Lung apices are clear. 1.8 cm hypodense left thyroid nodule Other: Choose 1 IMPRESSION: 1. No CT  evidence for acute intracranial abnormality. Mild atrophy. 2. Degenerative changes of the cervical spine. No acute osseous abnormality. 3. 1.8 cm hypodense left thyroid nodule. Recommend thyroid US (ref: J Am Coll Radiol. 2015 Feb;12(2): 143-50).This should be performed on a nonemergent basis Electronically Signed   By: Jasmine Pang M.D.   On: 05/22/2023 00:04    EKG: Independently reviewed.   Assessment/Plan Principal Problem:   Syncope and collapse Active Problems:   Mixed hyperlipidemia   Coronary atherosclerosis   PVD   Sleep apnea   Essential hypertension   Pulmonary edema   Coronary artery disease involving native coronary artery of native heart with angina pectoris (HCC)   S/P angioplasty with stent, atherectomy DES to pLAD and DES to mLAD 10/10/17    PAD (peripheral artery disease) (HCC)   Gastroesophageal reflux disease   Acute respiratory failure with hypoxia (HCC)   Hypertensive urgency   Lactic acidosis  Syncope and collapse - cause unknown but given extensive cardiac history will admit for further evaluation - obtain 2D echocardiogram for further evaluation  - monitor on continuous telemetry - continue supplemental oxygen  - check carotid doppler study  Acute HFrEF - presumably from fluid boluses given to resuscitate in ED - IV furosemide ordered - check ReDS vest reading - monitor daily weights - monitor I/O closely   OSA - pt reports he does NOT use CPAP at home - now off bipap  - ordered for bipap at bedtime if can tolerate  Hypertensive Urgency - resumed home medication for blood pressure  - IV labetalol ordered PRN SBP<170   CAD  - severe per patient  - has not followed up with cardiology in last few years - follow up TTE  - nitropaste placed in ED  Hyperlipidemia - resume home daily atorvastatin   Leukocytosis - no clear infection found at this time - was started on empiric antibiotics  - continue for now - check procalcitonin - repeat CBC  with diff in AM   Acute respiratory failure with hypoxia - initially required bipap therapy  - now off bipap - wean nasal cannula oxygen as able   GERD - pantoprazole ordered for GI protection   Uncontrolled Type 2 DM with vascular complications, with hyperglycemia  - follow up A1c - reduced home basal insulin from 100 units to 50 units for now - added prandial novolog 12 units TID with meals eaten >50%  - SSI coverage and frequent CBG monitoring ordered  Lactic Acidosis - suspect secondary to hypoxia  - sepsis ruled out - pt has been bolused with IV fluid   Hypomagnesemia - IV replacement ordered  - recheck in AM   Left thyroid nodule  - radiologist recommending US thyroid - ordered US thyroid for further evaluation   DVT prophylaxis: enoxaparin   Code Status: Full   Family Communication:   Disposition Plan: TBD   Consults called:   Admission status: OBV   Level of care: Telemetry Standley Dakins MD [facility] How to contact the [facility] Attending or Consulting provider 7A - 7P or covering provider during after hours 7P -7A, for this patient?  Check the care team in [facility] and look for a) attending/consulting [facility] provider listed and b) the [facility] team listed Log into [email or web address] and use [place]'s universal password to access. If you do not have the password, please contact the hospital operator. Locate the [facility] provider you are looking for under [facility] and page to a number that you can be directly reached. If you still have difficulty reaching the provider, please page the [facility] (Director on Call) for the Hospitalists listed on amion for assistance.   If 7PM-7AM, please contact night-coverage [email or web address] Password [facility]  05/22/2023, 11:57 AM

## 2023-05-23 ENCOUNTER — Observation Stay (HOSPITAL_COMMUNITY): Payer: Medicare Other

## 2023-05-23 DIAGNOSIS — J810 Acute pulmonary edema: Secondary | ICD-10-CM | POA: Diagnosis not present

## 2023-05-23 DIAGNOSIS — I10 Essential (primary) hypertension: Secondary | ICD-10-CM | POA: Diagnosis not present

## 2023-05-23 DIAGNOSIS — R55 Syncope and collapse: Secondary | ICD-10-CM | POA: Diagnosis not present

## 2023-05-23 DIAGNOSIS — I160 Hypertensive urgency: Secondary | ICD-10-CM | POA: Diagnosis not present

## 2023-05-23 LAB — CBC WITH DIFFERENTIAL/PLATELET
Abs Immature Granulocytes: 0.07 10*3/uL (ref 0.00–0.07)
Basophils Absolute: 0.1 10*3/uL (ref 0.0–0.1)
Basophils Relative: 0 %
Eosinophils Absolute: 0.3 10*3/uL (ref 0.0–0.5)
Eosinophils Relative: 2 %
HCT: 39.1 % (ref 39.0–52.0)
Hemoglobin: 12.7 g/dL — ABNORMAL LOW (ref 13.0–17.0)
Immature Granulocytes: 1 %
Lymphocytes Relative: 45 %
Lymphs Abs: 5.9 10*3/uL — ABNORMAL HIGH (ref 0.7–4.0)
MCH: 27.9 pg (ref 26.0–34.0)
MCHC: 32.5 g/dL (ref 30.0–36.0)
MCV: 85.9 fL (ref 80.0–100.0)
Monocytes Absolute: 0.6 10*3/uL (ref 0.1–1.0)
Monocytes Relative: 5 %
Neutro Abs: 6.1 10*3/uL (ref 1.7–7.7)
Neutrophils Relative %: 47 %
Platelets: 181 10*3/uL (ref 150–400)
RBC: 4.55 MIL/uL (ref 4.22–5.81)
RDW: 13.8 % (ref 11.5–15.5)
WBC: 13 10*3/uL — ABNORMAL HIGH (ref 4.0–10.5)
nRBC: 0 % (ref 0.0–0.2)

## 2023-05-23 LAB — GLUCOSE, CAPILLARY
Glucose-Capillary: 163 mg/dL — ABNORMAL HIGH (ref 70–99)
Glucose-Capillary: 150 mg/dL — ABNORMAL HIGH (ref 70–99)
Glucose-Capillary: 266 mg/dL — ABNORMAL HIGH (ref 70–99)
Glucose-Capillary: 152 mg/dL — ABNORMAL HIGH (ref 70–99)

## 2023-05-23 LAB — BASIC METABOLIC PANEL
Anion gap: 8 (ref 5–15)
BUN: 17 mg/dL (ref 8–23)
CO2: 28 mmol/L (ref 22–32)
Calcium: 8 mg/dL — ABNORMAL LOW (ref 8.9–10.3)
Chloride: 102 mmol/L (ref 98–111)
Creatinine, Ser: 0.76 mg/dL (ref 0.61–1.24)
GFR, Estimated: >60 mL/min (ref >60)
Glucose, Bld: 162 mg/dL — ABNORMAL HIGH (ref 70–99)
Potassium: 2.8 mmol/L — ABNORMAL LOW (ref 3.5–5.1)
Sodium: 138 mmol/L (ref 135–145)

## 2023-05-23 LAB — BRAIN NATRIURETIC PEPTIDE: B Natriuretic Peptide: 357 pg/mL — ABNORMAL HIGH (ref 0.0–100.0)

## 2023-05-23 LAB — MAGNESIUM: Magnesium: 1.6 mg/dL — ABNORMAL LOW (ref 1.7–2.4)

## 2023-05-23 MED ORDER — MAGNESIUM SULFATE 4 GM/100ML IV SOLN
4.0000 g | Freq: Once | INTRAVENOUS | Status: AC
  Administered 2023-05-23: 4 g via INTRAVENOUS
  Filled 2023-05-23: qty 100

## 2023-05-23 MED ORDER — ISOSORBIDE MONONITRATE ER 120 MG PO TB24: 120.0000 mg | ORAL_TABLET | Freq: Every day | ORAL | Status: DC

## 2023-05-23 MED ORDER — POTASSIUM CHLORIDE CRYS ER 20 MEQ PO TBCR
40.0000 meq | EXTENDED_RELEASE_TABLET | Freq: Once | ORAL | Status: AC
  Administered 2023-05-23: 40 meq via ORAL
  Filled 2023-05-23: qty 2

## 2023-05-23 MED ORDER — ATORVASTATIN CALCIUM 80 MG PO TABS: 80.0000 mg | ORAL_TABLET | Freq: Every day | ORAL | Status: DC

## 2023-05-23 MED ORDER — NITROGLYCERIN 0.4 MG SL SUBL: 0.4000 mg | SUBLINGUAL_TABLET | SUBLINGUAL | 3 refills | Status: AC | PRN

## 2023-05-23 MED ORDER — AMLODIPINE BESYLATE 10 MG PO TABS: 10.0000 mg | ORAL_TABLET | Freq: Every morning | ORAL | Status: AC

## 2023-05-23 NOTE — Progress Notes (Signed)
Patient is being discharged home. All discharge instructions reviewed with patient including medications, and follow up, and signs and symptoms of hypoglycemia, respiratory distress. Pt and daughter [name] [name] verbalized a full understanding. Pt daughter is his ride home.

## 2023-05-23 NOTE — Evaluation (Signed)
Physical Therapy Evaluation Patient Details Name: [name] [name] MRN: [id number] DOB: [birth year] Today's Date: 05/23/2023  History of Present Illness  77 y.o. M admitted on 05/21/23 due to a syncopal episode at home. Blood Glucose was noted to be 410 in ED. PMH significant for former smoker with history of coronary artery disease status post angioplasty and stent placement, mild OSA not on CPAP, renal artery stenosis, peripheral vascular disease, history of NSTEMI, chronic heart failure reduced EF, type 2 diabetes mellitus with vascular complications, hyperlipidemia, history of pulmonary edema, history of pneumonia.   Clinical Impression  Patient functioning near baseline for functional mobility and gait demonstrating good return for bed mobility, transfers and ambulating in room/hallway without loss of balance.  Plan:  Patient discharged from physical therapy to care of nursing for ambulation daily as tolerated for length of stay.          If plan is discharge home, recommend the following: Help with stairs or ramp for entrance   Can travel by private vehicle        Equipment Recommendations None recommended by PT  Recommendations for Other Services       Functional Status Assessment Patient has not had a recent decline in their functional status     Precautions / Restrictions Precautions Precautions: Fall Restrictions Weight Bearing Restrictions: No      Mobility  Bed Mobility Overal bed mobility: Modified Independent                  Transfers Overall transfer level: Modified independent                      Ambulation/Gait Ambulation/Gait assistance: Modified independent (Device/Increase time) Gait Distance (Feet): 150 Feet Assistive device: None Gait Pattern/deviations: WFL(Within Functional Limits) Gait velocity: slightly decreased     General Gait Details: grossly WFL with good return for ambulating in room/hallways without loss of  balance  Stairs            Wheelchair Mobility     Tilt Bed    Modified Rankin (Stroke Patients Only)       Balance Overall balance assessment: Mild deficits observed, not formally tested                                           Pertinent Vitals/Pain Pain Assessment Pain Assessment: No/denies pain    Home Living Family/patient expects to be discharged to:: Private residence Living Arrangements: Spouse/significant other;Children Available Help at Discharge: Family;Available 24 hours/day Type of Home: House Home Access: Stairs to enter Entrance Stairs-Rails: None Entrance Stairs-Number of Steps: 1 step   Home Layout: One level Home Equipment: Agricultural consultant (2 wheels);Cane - single point;Cane - quad;Wheelchair - manual      Prior Function Prior Level of Function : Independent/Modified Independent;Driving             Mobility Comments: Tourist information centre manager without AD, drives ADLs Comments: Independent     Extremity/Trunk Assessment   Upper Extremity Assessment Upper Extremity Assessment: Defer to OT evaluation    Lower Extremity Assessment Lower Extremity Assessment: Overall WFL for tasks assessed    Cervical / Trunk Assessment Cervical / Trunk Assessment: Kyphotic  Communication   Communication Communication: No apparent difficulties  Cognition Arousal: Alert Behavior During Therapy: WFL for tasks assessed/performed Overall Cognitive Status: Within Functional Limits for tasks assessed  General Comments General comments (skin integrity, edema, etc.): VSS onRA, O2 remained 95% and up with ambulation, HR around 100 with ambulation    Exercises     Assessment/Plan    PT Assessment Patient does not need any further PT services  PT Problem List         PT Treatment Interventions      PT Goals (Current goals can be found in the Care Plan section)  Acute Rehab PT  Goals Patient Stated Goal: return home with family to assist PT Goal Formulation: With patient Time For Goal Achievement: 05/23/23 Potential to Achieve Goals: Good    Frequency       Co-evaluation               AM-PAC PT "6 Clicks" Mobility  Outcome Measure Help needed turning from your back to your side while in a flat bed without using bedrails?: None Help needed moving from lying on your back to sitting on the side of a flat bed without using bedrails?: None Help needed moving to and from a bed to a chair (including a wheelchair)?: None Help needed standing up from a chair using your arms (e.g., wheelchair or bedside chair)?: None Help needed to walk in hospital room?: None Help needed climbing 3-5 steps with a railing? : A Little 6 Click Score: 23    End of Session   Activity Tolerance: Patient tolerated treatment well Patient left: in bed;with call bell/phone within reach Nurse Communication: Mobility status PT Visit Diagnosis: Unsteadiness on feet (R26.81);Other abnormalities of gait and mobility (R26.89);Muscle weakness (generalized) (M62.81)    Time: 1610-9604 PT Time Calculation (min) (ACUTE ONLY): 20 min   Charges:   PT Evaluation $PT Eval Moderate Complexity: 1 Mod PT Treatments $Therapeutic Activity: 8-22 mins PT General Charges $$ ACUTE PT VISIT: 1 Visit         1:38 PM, 05/23/23 Ocie Bob, MPT Physical Therapist with [facility] [phone] office [phone] mobile phone

## 2023-05-23 NOTE — Discharge Summary (Addendum)
Physician Discharge Summary  [name] [name] NWG:[id number] DOB: [birth year] DOA: 05/21/2023  PCP: Rebekah Chesterfield, NP Cardiology: [facility]  Admit date: 05/21/2023 Discharge date: 05/23/2023  Admitted From: Home  Disposition: home   Recommendations for Outpatient Follow-up:  Follow up with PCP in 1 weeks Follow up with cardiology in 2 weeks Please obtain BMP and magnesium in 1-2 weeks  Home Health: n/a   Discharge Condition: STABLE   CODE STATUS: FULL DIET: low sodium heart healthy   Brief Hospitalization Summary: Please see all hospital notes, images, labs for full details of the hospitalization. Admission HPI:  77 year old gentleman former smoker with history of coronary artery disease status post angioplasty and stent placement, mild OSA not on CPAP, renal artery stenosis, peripheral vascular disease, history of NSTEMI, chronic heart failure reduced EF, type 2 diabetes mellitus with vascular complications, hyperlipidemia, history of pulmonary edema, history of pneumonia presented to the emergency department after having a syncopal episode when he stood up at home last night.  He was having difficulty breathing just prior to passing out.  His wife reported that she noted he was breathing fast and complaining of shortness of breath prior to the incident.  Over the past several days he has had increasing cough and chest congestion.  He denies having chest pain.  He denies fever and chills.  Wife reported that he appeared to lose consciousness for about 10 minutes.  No seizure reported.  He improved after EMS arrived.  Patient has no memory of the event.  He was noted in the ED to have a blood glucose of 410.  His WBC was elevated at 15.  He had an elevated lactic acid and was bolused with IV fluids.  He subsequently developed pulmonary edema and treated with IV Lasix.  He was started on IV antibiotics.  He was started on BiPAP therapy further management.  And Nitropaste Nitropaste.  His blood  pressures were markedly elevated and he had crackles on chest exam.  CT chest negative for PE.  Trauma workup was unrevealing for acute injury.  Hypodense left thyroid nodule noted on CT.  Patient admitted for further management.  Hospital Course by problem list   Syncope and collapse - cause unknown but given extensive cardiac history will admit for further evaluation - 2D echocardiogram stable from prior testing: LVEF 40-45% with grade 1 DD, RWMAs.  - monitored on continuous telemetry - supplemental oxygen as needed - check carotid doppler study - no significant stenosis  - PT eval completed, no additional PT was recommended    Acute HFrEF - presumably from fluid boluses given to resuscitate in ED - IV furosemide ordered with good diuresis and patient feeling better - chest xray shows improvement - monitored daily weights - monitored I/O closely  - pt to resume his home diuretics at DC - follow up with [facility] cardiology in 2 weeks    Intake/Output Summary (Last 24 hours) at 05/23/2023 1535 Last data filed at 05/23/2023 1322 Gross per 24 hour  Intake 1395.75 ml  Output 1600 ml  Net -204.25 ml   Filed Weights   05/21/23 2143 05/22/23 1000 05/23/23 0500  Weight: 90 kg 84.8 kg 85 kg    OSA - pt reports he does NOT use CPAP at home - now off bipap  - ordered for bipap at bedtime if can tolerate   Hypertensive Urgency - resumed home medication for blood pressure  - IV labetalol ordered PRN SBP<170    CAD  - severe  per patient  - has not followed up with cardiology in last few years - follow up TTE  - nitropaste placed in ED   Hyperlipidemia - resume home daily atorvastatin    Leukocytosis - no clear infection found at this time - was started on empiric antibiotics but now discontinued - check procalcitonin: reassuring at 0.19 - repeat CBC with diff shows WBC trending down   Acute respiratory failure with hypoxia - initially required bipap therapy  - now off  bipap - wean nasal cannula oxygen as able  - due to acute heart failure exacerbation from fluids - he felt better after diuresis and quickly came off bipap   GERD - pantoprazole ordered for GI protection    Uncontrolled Type 2 DM with vascular complications, with hyperglycemia  - follow up A1c--10.4%  - reduced home basal insulin from 100 units to 50 units in  hospital - added prandial novolog 12 units TID with meals eaten >50% in hospital - SSI coverage and frequent CBG monitoring ordered in hospital CBG (last 3)  Recent Labs    05/23/23 0726 05/23/23 1005 05/23/23 1133  GLUCAP 150* 266* 152*   -pt to resume his home treatments at discharge with close outpatient follow up with his PCP strongly recommended    Lactic Acidosis - suspect secondary to hypoxia  - sepsis ruled out - pt was bolused with IV fluid in ED     Hypomagnesemia - IV replacement ordered and repleted  - recheck outpatient with PCP recommended     Left thyroid nodule  - radiologist recommending US thyroid - ordered US thyroid for further evaluation and was a reassuring study of a benign nodule no further work up recommended    Discharge Diagnoses:  Principal Problem:   Syncope and collapse Active Problems:   Mixed hyperlipidemia   Coronary atherosclerosis   PVD   Sleep apnea   Essential hypertension   Pulmonary edema   Coronary artery disease involving native coronary artery of native heart with angina pectoris (HCC)   S/P angioplasty with stent, atherectomy DES to pLAD and DES to mLAD 10/10/17    PAD (peripheral artery disease) (HCC)   Gastroesophageal reflux disease   Acute respiratory failure with hypoxia (HCC)   Hypertensive urgency   Lactic acidosis   Discharge Instructions: Discharge Instructions     Ambulatory referral to Cardiology   Complete by: As directed    Hospital follow up      Allergies as of 05/23/2023       Reactions   Bee Venom Swelling, Other (See Comments)   Other  Other (See Comments)   Penicillin G Other (See Comments)   Penicillins Itching, Swelling   Itching, swelling of the face/tongue/throat, SOB, or low BP   Plavix [clopidogrel Bisulfate] Other (See Comments)   Patient is noted to be a nonresponder to Plavix        Medication List     TAKE these medications    allopurinol 100 MG tablet Commonly known as: ZYLOPRIM Take 100 mg by mouth every morning.   amLODipine 10 MG tablet Commonly known as: NORVASC Take 1 tablet (10 mg total) by mouth every morning.   aspirin 81 MG tablet Take 81 mg by mouth every morning.   atorvastatin 80 MG tablet Commonly known as: LIPITOR Take 1 tablet (80 mg total) by mouth at bedtime.   carvedilol 25 MG tablet Commonly known as: COREG TAKE 1 TABLET BY MOUTH TWO TIMES DAILY WITH A MEAL What  changed: when to take this   chlorthalidone 25 MG tablet Commonly known as: HYGROTON TAKE 1 TABLET BY MOUTH EVERY DAY   empagliflozin 25 MG Tabs tablet Commonly known as: JARDIANCE Take 25 mg by mouth daily.   fenofibrate 145 MG tablet Commonly known as: TRICOR TAKE 1 TABLET BY MOUTH EVERY DAY   isosorbide mononitrate 120 MG 24 hr tablet Commonly known as: IMDUR Take 1 tablet (120 mg total) by mouth at bedtime.   losartan 100 MG tablet Commonly known as: COZAAR Take 100 mg by mouth every morning.   metFORMIN 500 MG 24 hr tablet Commonly known as: GLUCOPHAGE-XR Take 500 mg by mouth 2 (two) times daily.   nitroGLYCERIN 0.4 MG SL tablet Commonly known as: NITROSTAT Place 1 tablet (0.4 mg total) under the tongue every 5 (five) minutes as needed for chest pain.   omeprazole 20 MG capsule Commonly known as: PRILOSEC Take 20 mg by mouth every morning.   Toujeo Max SoloStar 300 UNIT/ML Solostar Pen Generic drug: insulin glargine (2 Unit Dial) Inject 100 Units into the skin daily.        Allergies  Allergen Reactions   Bee Venom Swelling and Other (See Comments)   Other Other (See Comments)    Penicillin G Other (See Comments)   Penicillins Itching and Swelling    Itching, swelling of the face/tongue/throat, SOB, or low BP   Plavix [Clopidogrel Bisulfate] Other (See Comments)    Patient is noted to be a nonresponder to Plavix   Allergies as of 05/23/2023       Reactions   Bee Venom Swelling, Other (See Comments)   Other Other (See Comments)   Penicillin G Other (See Comments)   Penicillins Itching, Swelling   Itching, swelling of the face/tongue/throat, SOB, or low BP   Plavix [clopidogrel Bisulfate] Other (See Comments)   Patient is noted to be a nonresponder to Plavix        Medication List     TAKE these medications    allopurinol 100 MG tablet Commonly known as: ZYLOPRIM Take 100 mg by mouth every morning.   amLODipine 10 MG tablet Commonly known as: NORVASC Take 1 tablet (10 mg total) by mouth every morning.   aspirin 81 MG tablet Take 81 mg by mouth every morning.   atorvastatin 80 MG tablet Commonly known as: LIPITOR Take 1 tablet (80 mg total) by mouth at bedtime.   carvedilol 25 MG tablet Commonly known as: COREG TAKE 1 TABLET BY MOUTH TWO TIMES DAILY WITH A MEAL What changed: when to take this   chlorthalidone 25 MG tablet Commonly known as: HYGROTON TAKE 1 TABLET BY MOUTH EVERY DAY   empagliflozin 25 MG Tabs tablet Commonly known as: JARDIANCE Take 25 mg by mouth daily.   fenofibrate 145 MG tablet Commonly known as: TRICOR TAKE 1 TABLET BY MOUTH EVERY DAY   isosorbide mononitrate 120 MG 24 hr tablet Commonly known as: IMDUR Take 1 tablet (120 mg total) by mouth at bedtime.   losartan 100 MG tablet Commonly known as: COZAAR Take 100 mg by mouth every morning.   metFORMIN 500 MG 24 hr tablet Commonly known as: GLUCOPHAGE-XR Take 500 mg by mouth 2 (two) times daily.   nitroGLYCERIN 0.4 MG SL tablet Commonly known as: NITROSTAT Place 1 tablet (0.4 mg total) under the tongue every 5 (five) minutes as needed for chest pain.    omeprazole 20 MG capsule Commonly known as: PRILOSEC Take 20 mg by mouth every morning.  Toujeo Max SoloStar 300 UNIT/ML Solostar Pen Generic drug: insulin glargine (2 Unit Dial) Inject 100 Units into the skin daily.        Procedures/Studies: DG CHEST PORT 1 VIEW  Result Date: 05/23/2023 CLINICAL DATA:  Follow-up CHF. EXAM: PORTABLE CHEST 1 VIEW COMPARISON:  05/22/2023 FINDINGS: Borderline enlarged cardiac silhouette without significant change. Coronary artery stent and dense calcifications. Mild decrease in prominence of the interstitial markings with persistent prominence of the pulmonary vasculature. Improved inspiration with resolved bibasilar atelectasis. Tortuous and partially calcified thoracic aorta. Thoracic spine degenerative changes. IMPRESSION: 1. Mild improvement in CHF. 2. Improved inspiration with resolved bibasilar atelectasis. 3. Coronary artery stents and dense atheromatous calcifications. Electronically Signed   By: Beckie Salts M.D.   On: 05/23/2023 11:37   US Carotid Bilateral  Result Date: 05/23/2023 : PROCEDURE: [place] EXTRACRANIAL STUDY BILAT HISTORY: Patient is a 77 y/o M with 9926 Syncope and collapse 9926. Hypertension. Syncope. Prior MI. CAD. Hyperlipidemia. Former tobacco use. Diabetes. COMPARISON: None. TECHNIQUE: Two-dimensional grayscale, color and spectral Doppler ultrasound imaging of the bilateral carotid arteries was performed. FINDINGS: There is mild-to-moderate plaque visualized in the right internal carotid artery and bulb resulting in <50% stenosis. The peak systolic and end-diastolic velocities in the right CCA, ICA and ECA are 55/9, 64/16 and 212 cm/s. The ICA/CCA ratio is 1.2. The external carotid artery is patent with normal flow. Antegrade flow is present in the vertebral artery. There is mild plaque visualized in the left internal carotid artery and bulb resulting in <50% stenosis. The ICA is tortuous within the mid and distal segments. The peak  systolic and end-diastolic velocities in the left CCA, ICA and ECA are 64/8, 71/13 and 140/8 cm/s. The ICA/CCA ratio is 1.1. The external carotid artery is patent with normal flow. Antegrade flow is present in the vertebral artery. IMPRESSION: 1. Less than 50% stenosis of the bilateral internal carotid arteries utilizing NASCET criteria. <180 * Plaque estimate:   No plaque * ICA/CCA   PSV Ratio   :     <2.0 * : Normal <180 * Plaque estimate: <50% plaque * ICA/CCA   PSV Ratio   :     <2.0 * : <50% 180-230 * Plaque estimate: >50% plaque * ICA/CCA   PSV Ratio   :   2.0 - 4.0 * : 50-69% >230 * Plaque estimate: >50% plaque * ICA/CCA   PSV Ratio   :     >4.0 * : >70% * Plaque estimate: * ICA/CCA   PSV Ratio   : * : * : ** PSV 125-180 cm/sec and ICA/CCA PSV Ratio ? 2.0 is also consistent with 50-69% stenosis. Thank you for allowing [place] to assist in the care of this patient. Electronically Signed   By: Lestine Box M.D.   On: 05/23/2023 07:32   US THYROID  Result Date: 05/23/2023 CLINICAL DATA:  Solitary thyroid nodule seen on recent chest CT Prior right thyroidectomy EXAM: THYROID ULTRASOUND TECHNIQUE: Ultrasound examination of the thyroid gland and adjacent soft tissues was performed. COMPARISON:  CT chest 05/22/2023 FINDINGS: Parenchymal Echotexture: Mildly heterogenous Isthmus: 0.2 cm Right lobe: Status post right thyroidectomy. No abnormality of the thyroidectomy bed. Left lobe: 6.6 x 2.7 x 3.3 cm _________________________________________________________ Estimated total number of nodules >/= 1 cm: 1 Number of spongiform nodules >/=  2 cm not described below (TR1): 0 Number of mixed cystic and solid nodules >/= 1.5 cm not described below (TR2): 0 _________________________________________________________ Nodule 1: 2.2 x 2.1 x 2.0  cm mixed solid cystic isoechoic left inferior thyroid nodule (TI-RADS 2) corresponds to the abnormality seen on recent chest CT. It does not meet criteria for FNA or imaging follow-up. No  additional thyroid nodules are seen. IMPRESSION: 1. Postsurgical changes of right thyroidectomy. No abnormality of the thyroidectomy bed. 2. 2.2 cm benign-appearing left inferior thyroid nodule corresponds to the abnormality seen on recent chest CT and does not meet criteria for imaging surveillance or FNA. The above is in keeping with the ACR TI-RADS recommendations - J Am Coll Radiol 2017;14:587-595. Electronically Signed   By: Acquanetta Belling M.D.   On: 05/23/2023 07:00   ECHOCARDIOGRAM COMPLETE  Result Date: 05/22/2023    ECHOCARDIOGRAM REPORT   Patient Name:   [name] [name] Date of Exam: 05/22/2023 Medical Rec #:  [id number]       Height:       70.0 in Accession #:    [id number]      Weight:       198.4 lb Date of Birth:  [birth year]       BSA:          2.080 m Patient Age:    77 years        BP:           171/88 mmHg Patient Gender: M               HR:           91 bpm. [facility] Procedure: 2D Echo, Cardiac Doppler, Color Doppler and Intracardiac            Opacification Agent Indications:    CHF-Acute Systolic I50.21  History:        Patient has prior history of Echocardiogram examinations, most                 recent 10/07/2017. NSTEMI, Signs/Symptoms:Syncope; Risk                 Factors:Hypertension, Diabetes and Dyslipidemia.  Sonographer:    Celesta Gentile RCS Referring Phys: [phone] Travarius Lange L Dawsen Krieger IMPRESSIONS  1. Left ventricular ejection fraction, by estimation, is 40 to 45%. The left ventricle has mildly decreased function. The left ventricle demonstrates regional wall motion abnormalities (see scoring diagram/findings for description). Left ventricular diastolic parameters are consistent with Grade I diastolic dysfunction (impaired relaxation).  2. Right ventricular systolic function is normal. The right ventricular size is normal. Tricuspid regurgitation signal is inadequate for assessing PA pressure.  3. Left atrial size was mild to moderately dilated.  4. The mitral valve is  grossly normal. Trivial mitral valve regurgitation.  5. The aortic valve is tricuspid. Aortic valve regurgitation is not visualized. Aortic valve sclerosis/calcification is present, without any evidence of aortic stenosis.  6. The inferior vena cava is normal in size with greater than 50% respiratory variability, suggesting right atrial pressure of 3 mmHg. Comparison(s): Prior images unable to be directly viewed. FINDINGS  Left Ventricle: Left ventricular ejection fraction, by estimation, is 40 to 45%. The left ventricle has mildly decreased function. The left ventricle demonstrates regional wall motion abnormalities. Definity contrast agent was given IV to delineate the left ventricular endocardial borders. The left ventricular internal cavity size was normal in size. There is borderline left ventricular hypertrophy. Left ventricular diastolic parameters are consistent with Grade I diastolic dysfunction (impaired relaxation).  LV Wall Scoring: The inferior wall and mid anteroseptal segment are akinetic. The mid inferoseptal segment, apical inferior segment, and basal inferoseptal segment  are hypokinetic. The entire anterior wall, entire lateral wall, basal anteroseptal segment, apical septal segment, and apex are normal. Right Ventricle: The right ventricular size is normal. No increase in right ventricular wall thickness. Right ventricular systolic function is normal. Tricuspid regurgitation signal is inadequate for assessing PA pressure. Left Atrium: Left atrial size was mild to moderately dilated. Right Atrium: Right atrial size was normal in size. Pericardium: There is no evidence of pericardial effusion. Mitral Valve: The mitral valve is grossly normal. Mild mitral annular calcification. Trivial mitral valve regurgitation. Tricuspid Valve: The tricuspid valve is grossly normal. Tricuspid valve regurgitation is trivial. Aortic Valve: The aortic valve is tricuspid. There is moderate aortic valve annular  calcification. Aortic valve regurgitation is not visualized. Aortic valve sclerosis/calcification is present, without any evidence of aortic stenosis. Pulmonic Valve: The pulmonic valve was not well visualized. Pulmonic valve regurgitation is trivial. Aorta: The aortic root is normal in size and structure. Venous: The inferior vena cava is normal in size with greater than 50% respiratory variability, suggesting right atrial pressure of 3 mmHg. IAS/Shunts: No atrial level shunt detected by color flow Doppler.  LEFT VENTRICLE PLAX 2D LVIDd:         5.40 cm      Diastology LVIDs:         4.30 cm      LV e' medial:    5.22 cm/s LV PW:         1.10 cm      LV E/e' medial:  13.5 LV IVS:        1.00 cm      LV e' lateral:   8.49 cm/s LVOT diam:     2.00 cm      LV E/e' lateral: 8.3 LV SV:         57 LV SV Index:   28 LVOT Area:     3.14 cm  LV Volumes (MOD) LV vol d, MOD A2C: 103.0 ml LV vol d, MOD A4C: 137.0 ml LV vol s, MOD A2C: 63.2 ml LV vol s, MOD A4C: 76.4 ml LV SV MOD A2C:     39.8 ml LV SV MOD A4C:     137.0 ml LV SV MOD BP:      51.1 ml RIGHT VENTRICLE RV S prime:     14.90 cm/s TAPSE (M-mode): 2.1 cm LEFT ATRIUM              Index        RIGHT ATRIUM           Index LA diam:        4.20 cm  2.02 cm/m   RA Area:     18.00 cm LA Vol (A2C):   114.0 ml 54.80 ml/m  RA Volume:   48.20 ml  23.17 ml/m LA Vol (A4C):   66.1 ml  31.77 ml/m LA Biplane Vol: 87.2 ml  41.92 ml/m  AORTIC VALVE LVOT Vmax:   86.80 cm/s LVOT Vmean:  54.500 cm/s LVOT VTI:    0.183 m  AORTA Ao Root diam: 3.30 cm MITRAL VALVE MV Area (PHT): 2.95 cm    SHUNTS MV Decel Time: 257 msec    Systemic VTI:  0.18 m MV E velocity: 70.70 cm/s  Systemic Diam: 2.00 cm MV A velocity: 99.80 cm/s MV E/A ratio:  0.71 Nona Dell MD Electronically signed by Nona Dell MD Signature Date/Time: 05/22/2023/12:51:49 PM    Final    DG Chest Port 1 View  Result  Date: 05/22/2023 CLINICAL DATA:  Acute heart failure. EXAM: PORTABLE CHEST 1 VIEW COMPARISON:   Same day. FINDINGS: Stable cardiomediastinal silhouette. Stable bilateral pulmonary edema. Stable bibasilar atelectasis. Bony thorax is unremarkable. IMPRESSION: Stable bilateral pulmonary edema and bibasilar atelectasis. Electronically Signed   By: Lupita Raider M.D.   On: 05/22/2023 08:16   DG Chest Portable 1 View  Result Date: 05/22/2023 CLINICAL DATA:  Shortness of breath. EXAM: PORTABLE CHEST 1 VIEW COMPARISON:  May 21, 2023 FINDINGS: The cardiac silhouette is mildly enlarged and unchanged in size. There is marked severity calcification of the thoracic aorta. Marked severity diffusely increased interstitial lung markings are seen with prominence of the central pulmonary vasculature. This is increased in severity when compared to the prior study. Mild atelectatic changes are seen within the bilateral lung bases. No pleural effusion or pneumothorax is identified. Multilevel degenerative changes are noted throughout the thoracic spine. IMPRESSION: 1. Cardiomegaly with marked severity interstitial edema. 2. Mild bibasilar atelectasis. Electronically Signed   By: Aram Candela M.D.   On: 05/22/2023 03:10   CT Angio Chest PE W and/or Wo Contrast  Result Date: 05/22/2023 CLINICAL DATA:  Pulmonary embolus suspected with high probability. Syncope when standing. Difficulty breathing before passing out. EXAM: CT ANGIOGRAPHY CHEST WITH CONTRAST TECHNIQUE: Multidetector CT imaging of the chest was performed using the standard protocol during bolus administration of intravenous contrast. Multiplanar CT image reconstructions and MIPs were obtained to evaluate the vascular anatomy. RADIATION DOSE REDUCTION: This exam was performed according to the departmental dose-optimization program which includes automated exposure control, adjustment of the mA and/or kV according to patient size and/or use of iterative reconstruction technique. CONTRAST:  75mL OMNIPAQUE IOHEXOL 350 MG/ML SOLN COMPARISON:  Chest  radiograph 05/21/2023.  CT chest 10/07/2017 FINDINGS: Cardiovascular: Technically adequate study with good opacification of the central and segmental pulmonary arteries. Mild motion artifact. No focal filling defects. No evidence of significant pulmonary embolus. Mild cardiac enlargement. No pericardial effusions. Normal caliber thoracic aorta. Diffuse calcification of the aorta and coronary arteries. Mediastinum/Nodes: 1.7 cm diameter hypodense thyroid nodule in the left, increasing since prior study. Esophagus is decompressed. Mediastinal lymph nodes are enlarged with pretracheal nodes measuring up to 1.5 cm diameter, left aortopulmonic window lymph nodes measuring 8 mm diameter, and subcarinal nodes measuring 1.3 cm diameter. Lymph nodes are similar to prior study, likely reactive. Lungs/Pleura: Emphysematous changes throughout the lungs. Scattered peripheral fibrosis. Bronchial wall thickening and central interstitial changes consistent with chronic bronchitis. No airspace disease or consolidation. No pleural effusion or pneumothorax. Upper Abdomen: No acute abnormalities demonstrated. Musculoskeletal: Degenerative changes in the spine. Old left rib fractures. Review of the MIP images confirms the above findings. IMPRESSION: 1. No evidence of significant pulmonary embolus. 2. Emphysematous changes and fibrosis in the lungs. No evidence of active pulmonary disease. 3. Enlarging 1.7 cm diameter thyroid nodule in the left. Recommend follow-up with elective thyroid US (ref: J Am Coll Radiol. 2015 Feb;12(2): 143-50). 4. Prominent mediastinal lymph nodes without change, likely reactive. Electronically Signed   By: Burman Nieves M.D.   On: 05/22/2023 01:16   DG Chest Port 1 View  Result Date: 05/22/2023 CLINICAL DATA:  Questionable sepsis. EXAM: PORTABLE CHEST 1 VIEW COMPARISON:  April 27, 2022 FINDINGS: The cardiac silhouette is mildly enlarged and unchanged in size. There is marked severity calcification of  the thoracic aorta. Stable, diffuse, mild to moderate severity increased interstitial lung markings are noted. There is no evidence of an acute infiltrate, pleural effusion or pneumothorax.  Multilevel degenerative changes are noted throughout the thoracic spine. IMPRESSION: Stable cardiomegaly with chronic appearing increased interstitial lung markings. Electronically Signed   By: Aram Candela M.D.   On: 05/22/2023 00:24   CT Head Wo Contrast  Result Date: 05/22/2023 CLINICAL DATA:  Syncope EXAM: CT HEAD WITHOUT CONTRAST CT CERVICAL SPINE WITHOUT CONTRAST TECHNIQUE: Multidetector CT imaging of the head and cervical spine was performed following the standard protocol without intravenous contrast. Multiplanar CT image reconstructions of the cervical spine were also generated. RADIATION DOSE REDUCTION: This exam was performed according to the departmental dose-optimization program which includes automated exposure control, adjustment of the mA and/or kV according to patient size and/or use of iterative reconstruction technique. COMPARISON:  CT brain 04/27/2022 FINDINGS: CT HEAD FINDINGS Brain: No acute territorial infarction, hemorrhage or intracranial mass. Mild atrophy. Stable ventricle size. Prominent perivascular space versus chronic lacunar infarct in the left basal ganglia. Vascular: No hyperdense vessels.  Carotid vascular calcification Skull: Normal. Negative for fracture or focal lesion. Sinuses/Orbits: No acute finding. Other: None CT CERVICAL SPINE FINDINGS Alignment: No malalignment.  Facet alignment is normal Skull base and vertebrae: No acute fracture. No primary bone lesion or focal pathologic process. Soft tissues and spinal canal: No prevertebral fluid or swelling. No visible canal hematoma. Disc levels: Mild disc space narrowing at C2-C3 and C7-T1. Mild multilevel facet degenerative changes Upper chest: Lung apices are clear. 1.8 cm hypodense left thyroid nodule Other: Choose 1 IMPRESSION: 1.  No CT evidence for acute intracranial abnormality. Mild atrophy. 2. Degenerative changes of the cervical spine. No acute osseous abnormality. 3. 1.8 cm hypodense left thyroid nodule. Recommend thyroid US (ref: J Am Coll Radiol. 2015 Feb;12(2): 143-50).This should be performed on a nonemergent basis Electronically Signed   By: Jasmine Pang M.D.   On: 05/22/2023 00:04   CT Cervical Spine Wo Contrast  Result Date: 05/22/2023 CLINICAL DATA:  Syncope EXAM: CT HEAD WITHOUT CONTRAST CT CERVICAL SPINE WITHOUT CONTRAST TECHNIQUE: Multidetector CT imaging of the head and cervical spine was performed following the standard protocol without intravenous contrast. Multiplanar CT image reconstructions of the cervical spine were also generated. RADIATION DOSE REDUCTION: This exam was performed according to the departmental dose-optimization program which includes automated exposure control, adjustment of the mA and/or kV according to patient size and/or use of iterative reconstruction technique. COMPARISON:  CT brain 04/27/2022 FINDINGS: CT HEAD FINDINGS Brain: No acute territorial infarction, hemorrhage or intracranial mass. Mild atrophy. Stable ventricle size. Prominent perivascular space versus chronic lacunar infarct in the left basal ganglia. Vascular: No hyperdense vessels.  Carotid vascular calcification Skull: Normal. Negative for fracture or focal lesion. Sinuses/Orbits: No acute finding. Other: None CT CERVICAL SPINE FINDINGS Alignment: No malalignment.  Facet alignment is normal Skull base and vertebrae: No acute fracture. No primary bone lesion or focal pathologic process. Soft tissues and spinal canal: No prevertebral fluid or swelling. No visible canal hematoma. Disc levels: Mild disc space narrowing at C2-C3 and C7-T1. Mild multilevel facet degenerative changes Upper chest: Lung apices are clear. 1.8 cm hypodense left thyroid nodule Other: Choose 1 IMPRESSION: 1. No CT evidence for acute intracranial  abnormality. Mild atrophy. 2. Degenerative changes of the cervical spine. No acute osseous abnormality. 3. 1.8 cm hypodense left thyroid nodule. Recommend thyroid US (ref: J Am Coll Radiol. 2015 Feb;12(2): 143-50).This should be performed on a nonemergent basis Electronically Signed   By: Jasmine Pang M.D.   On: 05/22/2023 00:04     Subjective:   Discharge Exam: Vitals:  05/23/23 1006 05/23/23 1357  BP: 121/61 (!) 103/59  Pulse: 92 73  Resp:  18  Temp:  98.2 F (36.8 C)  SpO2: 93% 92%   Vitals:   05/22/23 2000 05/23/23 0500 05/23/23 1006 05/23/23 1357  BP: 132/67 (!) 111/51 121/61 (!) 103/59  Pulse: 91 70 92 73  Resp:    18  Temp: 98.7 F (37.1 C) 97.8 F (36.6 C)  98.2 F (36.8 C)  TempSrc: Oral Oral  Oral  SpO2: 93% 98% 93% 92%  Weight:  85 kg    Height:        General: Pt is alert, awake, not in acute distress Cardiovascular: RRR, S1/S2 +, no rubs, no gallops Respiratory: CTA bilaterally, no wheezing, no rhonchi Abdominal: Soft, NT, ND, bowel sounds + Extremities: no edema, no cyanosis   The results of significant diagnostics from this hospitalization (including imaging, microbiology, ancillary and laboratory) are listed below for reference.     Microbiology: Recent Results (from the past 240 hour(s))  Blood Culture (routine x 2)     Status: None (Preliminary result)   Collection Time: 05/21/23 10:02 PM   Specimen: Right Antecubital; Blood  Result Value Ref Range Status   Specimen Description RIGHT ANTECUBITAL  Final   Special Requests   Final    BOTTLES DRAWN AEROBIC AND ANAEROBIC Blood Culture results may not be optimal due to an inadequate volume of blood received in culture bottles   Culture   Final    NO GROWTH 2 DAYS Performed at [facility], [address] [place], [place] [address]    Report Status PENDING  Incomplete  Resp panel by RT-PCR (RSV, Flu A&B, Covid) Anterior Nasal Swab     Status: None   Collection Time: 05/21/23 11:37 PM   Specimen:  Anterior Nasal Swab  Result Value Ref Range Status   SARS Coronavirus 2 by RT PCR NEGATIVE NEGATIVE Final    Comment: (NOTE) SARS-CoV-2 target nucleic acids are NOT DETECTED.  The SARS-CoV-2 RNA is generally detectable in upper respiratory specimens during the acute phase of infection. The lowest concentration of SARS-CoV-2 viral copies this assay can detect is 138 copies/mL. A negative result does not preclude SARS-Cov-2 infection and should not be used as the sole basis for treatment or other patient management decisions. A negative result may occur with  improper specimen collection/handling, submission of specimen other than nasopharyngeal swab, presence of viral mutation(s) within the areas targeted by this assay, and inadequate number of viral copies(<138 copies/mL). A negative result must be combined with clinical observations, patient history, and epidemiological information. The expected result is Negative.  Fact Sheet for Patients:  BloggerCourse.com  Fact Sheet for Healthcare Providers:  SeriousBroker.it  This test is no t yet approved or cleared by the [place] FDA and  has been authorized for detection and/or diagnosis of SARS-CoV-2 by FDA under an Emergency Use Authorization (EUA). This EUA will remain  in effect (meaning this test can be used) for the duration of the COVID-19 declaration under Section 564(b)(1) of the Act, 21 U.S.C.section 360bbb-3(b)(1), unless the authorization is terminated  or revoked sooner.       Influenza A by PCR NEGATIVE NEGATIVE Final   Influenza B by PCR NEGATIVE NEGATIVE Final    Comment: (NOTE) The Xpert Xpress SARS-CoV-2/FLU/RSV plus assay is intended as an aid in the diagnosis of influenza from Nasopharyngeal swab specimens and should not be used as a sole basis for treatment. Nasal washings and aspirates are unacceptable  for Xpert Xpress SARS-CoV-2/FLU/RSV testing.  Fact  Sheet for Patients: BloggerCourse.com  Fact Sheet for Healthcare Providers: SeriousBroker.it  This test is not yet approved or cleared by the [place] FDA and has been authorized for detection and/or diagnosis of SARS-CoV-2 by FDA under an Emergency Use Authorization (EUA). This EUA will remain in effect (meaning this test can be used) for the duration of the COVID-19 declaration under Section 564(b)(1) of the Act, 21 U.S.C. section 360bbb-3(b)(1), unless the authorization is terminated or revoked.     Resp Syncytial Virus by PCR NEGATIVE NEGATIVE Final    Comment: (NOTE) Fact Sheet for Patients: BloggerCourse.com  Fact Sheet for Healthcare Providers: SeriousBroker.it  This test is not yet approved or cleared by the [place] FDA and has been authorized for detection and/or diagnosis of SARS-CoV-2 by FDA under an Emergency Use Authorization (EUA). This EUA will remain in effect (meaning this test can be used) for the duration of the COVID-19 declaration under Section 564(b)(1) of the Act, 21 U.S.C. section 360bbb-3(b)(1), unless the authorization is terminated or revoked.  Performed at [facility], [address], [place], [place] [address]   Blood Culture (routine x 2)     Status: None (Preliminary result)   Collection Time: 05/22/23 12:35 AM   Specimen: BLOOD RIGHT WRIST  Result Value Ref Range Status   Specimen Description BLOOD RIGHT WRIST  Final   Special Requests   Final    BOTTLES DRAWN AEROBIC AND ANAEROBIC Blood Culture results may not be optimal due to an inadequate volume of blood received in culture bottles   Culture   Final    NO GROWTH 1 DAY Performed at [facility], [address] [place], [place] [address]    Report Status PENDING  Incomplete     Labs: BNP (last 3 results) Recent Labs    05/23/23 0450  BNP 357.0*   Basic Metabolic  Panel: Recent Labs  Lab 05/21/23 2202 05/22/23 0712 05/23/23 0450  NA 136 137 138  K 3.4* 3.3* 2.8*  CL 97* 100 102  CO2 26 25 28   GLUCOSE 410* 417* 162*  BUN 18 16 17   CREATININE 1.02 0.78 0.76  CALCIUM 8.5* 8.3* 8.0*  MG  --  1.6* 1.6*   Liver Function Tests: No results for input(s): "AST", "ALT", "ALKPHOS", "BILITOT", "PROT", "ALBUMIN" in the last 168 hours. No results for input(s): "LIPASE", "AMYLASE" in the last 168 hours. No results for input(s): "AMMONIA" in the last 168 hours. CBC: Recent Labs  Lab 05/21/23 2202 05/22/23 0712 05/23/23 0450  WBC 15.1* 16.7* 13.0*  NEUTROABS  --   --  6.1  HGB 14.8 15.3 12.7*  HCT 44.5 45.7 39.1  MCV 86.4 86.2 85.9  PLT 190 216 181   Cardiac Enzymes: No results for input(s): "CKTOTAL", "CKMB", "CKMBINDEX", "TROPONINI" in the last 168 hours. BNP: Invalid input(s): "POCBNP" CBG: Recent Labs  Lab 05/22/23 1942 05/23/23 0329 05/23/23 0726 05/23/23 1005 05/23/23 1133  GLUCAP 96 163* 150* 266* 152*   D-Dimer No results for input(s): "DDIMER" in the last 72 hours. Hgb A1c Recent Labs    05/22/23 0712  HGBA1C 10.4*   Lipid Profile No results for input(s): "CHOL", "HDL", "LDLCALC", "TRIG", "CHOLHDL", "LDLDIRECT" in the last 72 hours. Thyroid function studies No results for input(s): "TSH", "T4TOTAL", "T3FREE", "THYROIDAB" in the last 72 hours.  Invalid input(s): "FREET3" Anemia work up No results for input(s): "VITAMINB12", "FOLATE", "FERRITIN", "TIBC", "IRON", "RETICCTPCT" in the last 72 hours. Urinalysis  Component Value Date/Time   COLORURINE STRAW (A) 05/21/2023 2338   APPEARANCEUR CLEAR 05/21/2023 2338   LABSPEC 1.024 05/21/2023 2338   PHURINE 6.0 05/21/2023 2338   GLUCOSEU >=500 (A) 05/21/2023 2338   HGBUR SMALL (A) 05/21/2023 2338   BILIRUBINUR NEGATIVE 05/21/2023 2338   KETONESUR NEGATIVE 05/21/2023 2338   PROTEINUR >=300 (A) 05/21/2023 2338   UROBILINOGEN 0.2 06/12/2010 1605   NITRITE NEGATIVE  05/21/2023 2338   LEUKOCYTESUR NEGATIVE 05/21/2023 2338   Sepsis Labs Recent Labs  Lab 05/21/23 2202 05/22/23 0712 05/23/23 0450  WBC 15.1* 16.7* 13.0*   Microbiology Recent Results (from the past 240 hour(s))  Blood Culture (routine x 2)     Status: None (Preliminary result)   Collection Time: 05/21/23 10:02 PM   Specimen: Right Antecubital; Blood  Result Value Ref Range Status   Specimen Description RIGHT ANTECUBITAL  Final   Special Requests   Final    BOTTLES DRAWN AEROBIC AND ANAEROBIC Blood Culture results may not be optimal due to an inadequate volume of blood received in culture bottles   Culture   Final    NO GROWTH 2 DAYS Performed at [facility], [address], [place], [place] [address]    Report Status PENDING  Incomplete  Resp panel by RT-PCR (RSV, Flu A&B, Covid) Anterior Nasal Swab     Status: None   Collection Time: 05/21/23 11:37 PM   Specimen: Anterior Nasal Swab  Result Value Ref Range Status   SARS Coronavirus 2 by RT PCR NEGATIVE NEGATIVE Final    Comment: (NOTE) SARS-CoV-2 target nucleic acids are NOT DETECTED.  The SARS-CoV-2 RNA is generally detectable in upper respiratory specimens during the acute phase of infection. The lowest concentration of SARS-CoV-2 viral copies this assay can detect is 138 copies/mL. A negative result does not preclude SARS-Cov-2 infection and should not be used as the sole basis for treatment or other patient management decisions. A negative result may occur with  improper specimen collection/handling, submission of specimen other than nasopharyngeal swab, presence of viral mutation(s) within the areas targeted by this assay, and inadequate number of viral copies(<138 copies/mL). A negative result must be combined with clinical observations, patient history, and epidemiological information. The expected result is Negative.  Fact Sheet for Patients:  BloggerCourse.com  Fact Sheet for  Healthcare Providers:  SeriousBroker.it  This test is no t yet approved or cleared by the [place] FDA and  has been authorized for detection and/or diagnosis of SARS-CoV-2 by FDA under an Emergency Use Authorization (EUA). This EUA will remain  in effect (meaning this test can be used) for the duration of the COVID-19 declaration under Section 564(b)(1) of the Act, 21 U.S.C.section 360bbb-3(b)(1), unless the authorization is terminated  or revoked sooner.       Influenza A by PCR NEGATIVE NEGATIVE Final   Influenza B by PCR NEGATIVE NEGATIVE Final    Comment: (NOTE) The Xpert Xpress SARS-CoV-2/FLU/RSV plus assay is intended as an aid in the diagnosis of influenza from Nasopharyngeal swab specimens and should not be used as a sole basis for treatment. Nasal washings and aspirates are unacceptable for Xpert Xpress SARS-CoV-2/FLU/RSV testing.  Fact Sheet for Patients: BloggerCourse.com  Fact Sheet for Healthcare Providers: SeriousBroker.it  This test is not yet approved or cleared by the [place] FDA and has been authorized for detection and/or diagnosis of SARS-CoV-2 by FDA under an Emergency Use Authorization (EUA). This EUA will remain in effect (meaning this test can be used) for the  duration of the COVID-19 declaration under Section 564(b)(1) of the Act, 21 U.S.C. section 360bbb-3(b)(1), unless the authorization is terminated or revoked.     Resp Syncytial Virus by PCR NEGATIVE NEGATIVE Final    Comment: (NOTE) Fact Sheet for Patients: BloggerCourse.com  Fact Sheet for Healthcare Providers: SeriousBroker.it  This test is not yet approved or cleared by the [place] FDA and has been authorized for detection and/or diagnosis of SARS-CoV-2 by FDA under an Emergency Use Authorization (EUA). This EUA will remain in effect (meaning this  test can be used) for the duration of the COVID-19 declaration under Section 564(b)(1) of the Act, 21 U.S.C. section 360bbb-3(b)(1), unless the authorization is terminated or revoked.  Performed at [facility], [address], [place], [place] [address]   Blood Culture (routine x 2)     Status: None (Preliminary result)   Collection Time: 05/22/23 12:35 AM   Specimen: BLOOD RIGHT WRIST  Result Value Ref Range Status   Specimen Description BLOOD RIGHT WRIST  Final   Special Requests   Final    BOTTLES DRAWN AEROBIC AND ANAEROBIC Blood Culture results may not be optimal due to an inadequate volume of blood received in culture bottles   Culture   Final    NO GROWTH 1 DAY Performed at [facility], [address] [place], [place] [address]    Report Status PENDING  Incomplete   Time coordinating discharge: 41 mins  SIGNED:  Standley Dakins, MD  [facility] 05/23/2023, 3:32 PM How to contact the [facility] Attending or Consulting provider 7A - 7P or covering provider during after hours 7P -7A, for this patient?  Check the care team in [facility] and look for a) attending/consulting [facility] provider listed and b) the [facility] team listed Log into [email or web address] and use [facility] universal password to access. If you do not have the password, please contact the hospital operator. Locate the [facility] provider you are looking for under [facility] and page to a number that you can be directly reached. If you still have difficulty reaching the provider, please page the [facility] (Director on Call) for the Hospitalists listed on amion for assistance.

## 2023-05-23 NOTE — Progress Notes (Signed)
   05/23/23 1237  TOC Brief Assessment  Insurance and Status Reviewed  Patient has primary care physician Yes  Home environment has been reviewed Home with Spouse  Prior level of function: Independent  Prior/Current Home Services No current home services  Social Determinants of Health Reivew SDOH reviewed no interventions necessary  Readmission risk has been reviewed Yes  Transition of care needs no transition of care needs at this time   PT eval had no recommendation.   Transition of Care Department [facility]) has reviewed patient and no TOC needs have been identified at this time. We will continue to monitor patient advancement through interdisciplinary progression rounds. If new patient transition needs arise, please place a TOC consult.

## 2023-05-23 NOTE — Care Management Obs Status (Signed)
MEDICARE OBSERVATION STATUS NOTIFICATION   Patient Details  Name: [name] [name] MRN: [id number] Date of Birth: 10-09-1945   Medicare Observation Status Notification Given:  Yes    Corey Harold 05/23/2023, 2:35 PM

## 2023-05-23 NOTE — Discharge Instructions (Signed)
IMPORTANT INFORMATION: PAY CLOSE ATTENTION  ° °PHYSICIAN DISCHARGE INSTRUCTIONS ° °Follow with Primary care provider  Dickey, Kirkland M, NP  and other consultants as instructed by your Hospitalist Physician ° °SEEK MEDICAL CARE OR RETURN TO EMERGENCY ROOM IF SYMPTOMS COME BACK, WORSEN OR NEW PROBLEM DEVELOPS  ° °Please note: °You were cared for by a hospitalist during your hospital stay. Every effort will be made to forward records to your primary care provider.  You can request that your primary care provider send for your hospital records if they have not received them.  Once you are discharged, your primary care physician will handle any further medical issues. Please note that NO REFILLS for any discharge medications will be authorized once you are discharged, as it is imperative that you return to your primary care physician (or establish a relationship with a primary care physician if you do not have one) for your post hospital discharge needs so that they can reassess your need for medications and monitor your lab values. ° °Please get a complete blood count and chemistry panel checked by your Primary MD at your next visit, and again as instructed by your Primary MD. ° °Get Medicines reviewed and adjusted: °Please take all your medications with you for your next visit with your Primary MD ° °Laboratory/radiological data: °Please request your Primary MD to go over all hospital tests and procedure/radiological results at the follow up, please ask your primary care provider to get all Hospital records sent to his/her office. ° °In some cases, they will be blood work, cultures and biopsy results pending at the time of your discharge. Please request that your primary care provider follow up on these results. ° °If you are diabetic, please bring your blood sugar readings with you to your follow up appointment with primary care.   ° °Please call and make your follow up appointments as soon as possible.   ° °Also  Note the following: °If you experience worsening of your admission symptoms, develop shortness of breath, life threatening emergency, suicidal or homicidal thoughts you must seek medical attention immediately by calling 911 or calling your MD immediately  if symptoms less severe. ° °You must read complete instructions/literature along with all the possible adverse reactions/side effects for all the Medicines you take and that have been prescribed to you. Take any new Medicines after you have completely understood and accpet all the possible adverse reactions/side effects.  ° °Do not drive when taking Pain medications or sleeping medications (Benzodiazepines) ° °Do not take more than prescribed Pain, Sleep and Anxiety Medications. It is not advisable to combine anxiety,sleep and pain medications without talking with your primary care practitioner ° °Special Instructions: If you have smoked or chewed Tobacco  in the last 2 yrs please stop smoking, stop any regular Alcohol  and or any Recreational drug use. ° °Wear Seat belts while driving.  Do not drive if taking any narcotic, mind altering or controlled substances or recreational drugs or alcohol.  ° ° ° ° ° °

## 2023-05-23 NOTE — Evaluation (Signed)
Occupational Therapy Evaluation Patient Details Name: [name] [name] MRN: [id number] DOB: October 15, 1945 Today's Date: 05/23/2023   History of Present Illness 77 y.o. M admitted on 05/21/23 due to a syncopal episode at home. Blood Glucose was noted to be 410 in ED. PMH significant for former smoker with history of coronary artery disease status post angioplasty and stent placement, mild OSA not on CPAP, renal artery stenosis, peripheral vascular disease, history of NSTEMI, chronic heart failure reduced EF, type 2 diabetes mellitus with vascular complications, hyperlipidemia, history of pulmonary edema, history of pneumonia.   Clinical Impression   Pt admitted for concerns listed above. PTA pt reported that he was independent with all ADL's and IADL's, including driving and caring for his wife and great grand children. At this time, he is requiring no assist for all functional mobility and BADL's. Pt's vitals remained stable throughout session, with no concerns. Pt will be discharged from skilled OT at this time, with all needs met.       If plan is discharge home, recommend the following: Other (comment) (None needed)    Functional Status Assessment  Patient has had a recent decline in their functional status and demonstrates the ability to make significant improvements in function in a reasonable and predictable amount of time.  Equipment Recommendations  None recommended by OT    Recommendations for Other Services       Precautions / Restrictions Precautions Precautions: Fall Restrictions Weight Bearing Restrictions: No      Mobility Bed Mobility Overal bed mobility: Modified Independent             General bed mobility comments: Increased effort from flat position    Transfers Overall transfer level: Modified independent Equipment used: None               General transfer comment: No Concerns      Balance Overall balance assessment: Mild deficits observed, not  formally tested                                         ADL either performed or assessed with clinical judgement   ADL Overall ADL's : Modified independent;At baseline                                       General ADL Comments: No Concerns, pt demonstrating good balance and mobility to complete all BADL's.     Vision Baseline Vision/History: 0 No visual deficits Ability to See in Adequate Light: 0 Adequate Patient Visual Report: No change from baseline Vision Assessment?: No apparent visual deficits     Perception Perception: Not tested       Praxis Praxis: Not tested       Pertinent Vitals/Pain Pain Assessment Pain Assessment: No/denies pain     Extremity/Trunk Assessment Upper Extremity Assessment Upper Extremity Assessment: Overall WFL for tasks assessed   Lower Extremity Assessment Lower Extremity Assessment: Overall WFL for tasks assessed   Cervical / Trunk Assessment Cervical / Trunk Assessment: Kyphotic   Communication Communication Communication: No apparent difficulties   Cognition Arousal: Alert Behavior During Therapy: WFL for tasks assessed/performed Overall Cognitive Status: Within Functional Limits for tasks assessed  General Comments  VSS onRA, O2 remained 95% and up with ambulation, HR around 100 with ambulation    Exercises     Shoulder Instructions      Home Living Family/patient expects to be discharged to:: Private residence Living Arrangements: Spouse/significant other;Children Available Help at Discharge: Family;Available 24 hours/day Type of Home: House Home Access: Stairs to enter [employer] of Steps: 1 step Entrance Stairs-Rails: None Home Layout: One level     Bathroom Shower/Tub: Chief Strategy Officer: Handicapped height Bathroom Accessibility: No   Home Equipment: Agricultural consultant (2 wheels);Cane - single  point;Cane - quad;Wheelchair - manual          Prior Functioning/Environment Prior Level of Function : Independent/Modified Independent             Mobility Comments: No DME needed ADLs Comments: Reports indep        OT Problem List: Decreased activity tolerance;Impaired balance (sitting and/or standing)      OT Treatment/Interventions:      OT Goals(Current goals can be found in the care plan section) Acute Rehab OT Goals Patient Stated Goal: To go home OT Goal Formulation: With patient Time For Goal Achievement: 05/23/23 Potential to Achieve Goals: Good  OT Frequency:      Co-evaluation              AM-PAC OT "6 Clicks" Daily Activity     Outcome Measure Help from another person eating meals?: None Help from another person taking care of personal grooming?: None Help from another person toileting, which includes using toliet, bedpan, or urinal?: None Help from another person bathing (including washing, rinsing, drying)?: None Help from another person to put on and taking off regular upper body clothing?: None Help from another person to put on and taking off regular lower body clothing?: None 6 Click Score: 24   End of Session Nurse Communication: Mobility status  Activity Tolerance: Patient tolerated treatment well Patient left: in bed;with call bell/phone within reach  OT Visit Diagnosis: Unsteadiness on feet (R26.81);Muscle weakness (generalized) (M62.81)                Time: 1610-9604 OT Time Calculation (min): 20 min Charges:  OT General Charges $OT Visit: 1 Visit OT Evaluation $OT Eval Moderate Complexity: 1 Mod  [name] [name] [name] [name], OTR/L [facility] Acute Rehabilitation  [name] [name] [name] [name] 05/23/2023, 12:14 PM

## 2023-05-26 LAB — CULTURE, BLOOD (ROUTINE X 2): Culture: NO GROWTH

## 2023-05-27 LAB — CULTURE, BLOOD (ROUTINE X 2): Culture: NO GROWTH

## 2023-07-08 NOTE — Progress Notes (Unsigned)
Cardiology Office Note:  .   Date:  07/11/2023  ID:  [name] [name], DOB 1946-06-03, MRN [id number] PCP: Rebekah Chesterfield, NP  [facility] Providers Cardiologist:  Rollene Rotunda, MD }   History of Present Illness: .   [name] [name] is a 78 y.o. male presents for for follow up of his CAD.  He was admitted Sept 2015 with non-ST elevation MI. He was found to have an occluded right coronary artery but patent LAD stents. He was managed medically. Staged PCI of mid/distal LAD using Resolute Onyx 2.0 x 18 mm DES. 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 Other history includes HTN, DM, PVD.   He was recently hospitalized between 05/21/2023 and 05/23/2019 for after admission for diastolic CHF and hyperglycemia.  He developed pulmonary edema and was treated with BiPAP and IV Lasix.  He was found to be hypertensive as well.  Echocardiogram revealed an EF of 45% with grade 1 diastolic dysfunction.  He was continued on home chlorthalidone and Jardiance, but not on any p.o. Lasix or torsemide.  Insulin was adjusted.  He comes today without any complaints.  He he continues to eat salted foods, and drinks sugary drinks his blood sugar has been elevated at home.  He is medically compliant however concerning the medicines he was sent home on.  He denies any chest pain, shortness of breath, or dizziness.  His weight has been essentially the same since returning home without any evidence of swelling.  Studies Reviewed: Marland Kitchen   EKG Interpretation Date/Time:  Thursday July 11 2023 13:28:02 EST Ventricular Rate:  80 PR Interval:  154 QRS Duration:  98 QT Interval:  388 QTC Calculation: 447 R Axis:   54  Text Interpretation: Normal sinus rhythm Cannot rule out Inferior infarct , age undetermined ST & T wave abnormality, consider lateral ischemia When compared with ECG of 22-May-2023 02:35,  PREVIOUS ECG IS PRESENT Confirmed by Joni Reining [phone]) on 07/11/2023 2:47:09 PM  Echocardiogram 05/21/2024   1. Left ventricular ejection fraction, by estimation, is 40 to 45%. The  left ventricle has mildly decreased function. The left ventricle  demonstrates regional wall motion abnormalities (see scoring  diagram/findings for description). Left ventricular  diastolic parameters are consistent with Grade I diastolic dysfunction  (impaired relaxation).   2. Right ventricular systolic function is normal. The right ventricular  size is normal. Tricuspid regurgitation signal is inadequate for assessing  PA pressure.   3. Left atrial size was mild to moderately dilated.   4. The mitral valve is grossly normal. Trivial mitral valve  regurgitation.   5. The aortic valve is tricuspid. Aortic valve regurgitation is not  visualized. Aortic valve sclerosis/calcification is present, without any  evidence of aortic stenosis.   6. The inferior vena cava is normal in size with greater than 50%  respiratory variability, suggesting right atrial pressure of 3 mmHg.   Physical Exam:   VS:  BP 130/62   Pulse 80   Ht 5\' 11"  (1.803 m)   Wt 190 lb (86.2 kg)   SpO2 95%   BMI 26.50 kg/m    Wt Readings from Last 3 Encounters:  07/11/23 190 lb (86.2 kg)  05/23/23 187 lb 6.3 oz (85 kg)  03/14/23 198 lb (89.8 kg)    GEN: Well nourished, well developed in no acute distress NECK: No JVD; No carotid bruits CARDIAC: RRR, no  murmurs, rubs, gallops RESPIRATORY:  Clear to auscultation without rales, wheezing or rhonchi  ABDOMEN: Soft, non-tender, non-distended EXTREMITIES:  No edema; No deformity   ASSESSMENT AND PLAN: .     Coronary artery disease: He has a history of non-ST elevation MI in 2015 with staged PCI of the mid vessel LAD, and IV as guided PCI to the ostial LAD.  He is without chest pain currently.  EF was 40 to 45%.  He continues to eat pretty much which she would like to but he is  medically compliant.  I have counseled him on low-cholesterol low-sodium diet.  He verbalizes understanding.  He states that he gave up smoking was not going to give up the foods he likes.   2.   HFrEF: During hospitalization he did have pulmonary edema which was treated with IV Lasix and BiPAP.  He was not sent home on any p.o. Lasix but continued on chlorthalidone and Jardiance 10 mg.  I would like to start him on Entresto but he refuses at this time stating that he still having trouble affording the Jardiance.  He is going through programs and wants to be sure he can afford the Jardiance before moving forward with new medications.  He seems very reluctant to make any changes at this time.  He is on losartan 100 mg daily as well as carvedilol 25 mg twice a day.  Currently there is no evidence for volume overload.  He again is counseled on low-sodium diet.  He verbalizes understanding  3.  Uncontrolled diabetes: Insulin adjustments were made during hospitalization as well as continued on metformin.  The patient states his blood sugars been running between 160 and 190 at home.  He is not going to be compliant with l diabetic diet.  4.  Hypercholesterolemia: Goal of LDL is less than 70.  Labs are followed by Dr. Milinda Cave his primary care physician.  He remains on atorvastatin 80 mg daily.      Signed, Bettey Mare. Liborio Nixon, ANP, AACC

## 2023-07-11 ENCOUNTER — Ambulatory Visit: Payer: Medicare Other | Attending: Adult Health | Admitting: Adult Health

## 2023-07-11 ENCOUNTER — Encounter: Payer: Self-pay | Admitting: Adult Health

## 2023-07-11 VITALS — BP 130/62 | HR 80 | Ht 71.0 in | Wt 190.0 lb

## 2023-07-11 DIAGNOSIS — I2510 Atherosclerotic heart disease of native coronary artery without angina pectoris: Secondary | ICD-10-CM

## 2023-07-11 DIAGNOSIS — E7800 Pure hypercholesterolemia, unspecified: Secondary | ICD-10-CM | POA: Diagnosis not present

## 2023-07-11 DIAGNOSIS — E119 Type 2 diabetes mellitus without complications: Secondary | ICD-10-CM | POA: Diagnosis not present

## 2023-07-11 DIAGNOSIS — Z794 Long term (current) use of insulin: Secondary | ICD-10-CM | POA: Insufficient documentation

## 2023-07-11 DIAGNOSIS — T82858A Stenosis of vascular prosthetic devices, implants and grafts, initial encounter: Secondary | ICD-10-CM

## 2023-07-11 DIAGNOSIS — I10 Essential (primary) hypertension: Secondary | ICD-10-CM | POA: Diagnosis not present

## 2023-07-11 MED ORDER — FENOFIBRATE 145 MG PO TABS: 145.0000 mg | ORAL_TABLET | Freq: Every day | ORAL | 3 refills | Status: AC

## 2023-07-11 NOTE — Patient Instructions (Signed)
Medication Instructions:  No Changes *If you need a refill on your cardiac medications before your next appointment, please call your pharmacy*   Lab Work: No Labs If you have labs (blood work) drawn today and your tests are completely normal, you will receive your results only by: MyChart Message (if you have MyChart) OR A paper copy in the mail If you have any lab test that is abnormal or we need to change your treatment, we will call you to review the results.   Testing/Procedures: No Testing   Follow-Up: At [facility], you and your health needs are our priority.  As part of our continuing mission to provide you with exceptional heart care, we have created designated Provider Care Teams.  These Care Teams include your primary Cardiologist (physician) and Advanced Practice Providers (APPs -  Physician Assistants and Nurse Practitioners) who all work together to provide you with the care you need, when you need it.  We recommend signing up for the patient portal called "MyChart".  Sign up information is provided on this After Visit Summary.  MyChart is used to connect with patients for Virtual Visits (Telemedicine).  Patients are able to view lab/test results, encounter notes, upcoming appointments, etc.  Non-urgent messages can be sent to your provider as well.   To learn more about what you can do with MyChart, go to [email or web address].    Your next appointment:   3 month(s)  Provider:   Rollene Rotunda, MD

## 2023-08-27 ENCOUNTER — Encounter (HOSPITAL_COMMUNITY): Payer: Medicare Other

## 2023-08-27 ENCOUNTER — Other Ambulatory Visit (HOSPITAL_COMMUNITY): Payer: Medicare Other

## 2023-08-27 ENCOUNTER — Ambulatory Visit: Payer: Medicare Other

## 2023-09-17 ENCOUNTER — Ambulatory Visit (INDEPENDENT_AMBULATORY_CARE_PROVIDER_SITE_OTHER)
Admission: RE | Admit: 2023-09-17 | Discharge: 2023-09-17 | Disposition: A | Discharge status: Home / Self Care | Payer: Medicare Other | Source: Ambulatory Visit | Attending: Vascular Surgery

## 2023-09-17 ENCOUNTER — Ambulatory Visit (INDEPENDENT_AMBULATORY_CARE_PROVIDER_SITE_OTHER): Payer: Medicare Other | Admitting: Physician Assistant

## 2023-09-17 ENCOUNTER — Ambulatory Visit (HOSPITAL_COMMUNITY)
Admission: RE | Admit: 2023-09-17 | Discharge: 2023-09-17 | Disposition: A | Discharge status: Home / Self Care | Payer: Medicare Other | Source: Ambulatory Visit | Attending: Vascular Surgery | Admitting: Vascular Surgery

## 2023-09-17 ENCOUNTER — Encounter: Payer: Self-pay | Admitting: Physician Assistant

## 2023-09-17 VITALS — BP 177/85 | HR 95 | Temp 97.9°F | Resp 18 | Ht 71.0 in | Wt 189.8 lb

## 2023-09-17 DIAGNOSIS — I739 Peripheral vascular disease, unspecified: Secondary | ICD-10-CM

## 2023-09-17 DIAGNOSIS — I70498 Other atherosclerosis of autologous vein bypass graft(s) of the extremities, other extremity: Secondary | ICD-10-CM

## 2023-09-17 LAB — VAS US ABI WITH/WO TBI: Right ABI: 0.71

## 2023-09-17 NOTE — Progress Notes (Signed)
 [facility] [place] HISTORY AND PHYSICAL   History of Present Illness:  Patient is a 78 y.o. year old male who presents for evaluation of  left-sided femoral to below-knee popliteal artery bypass using greater saphenous vein.  He has history of left CFA to below knee popliteal artery bypass with ipsilateral non reversed GSV on 07/27/2020 by Dr. Chestine Spore for CLI of the LLE with rest pain.    He was found to have occlusion distally with bypass patency living off of retrograde flow into the popliteal artery on duplex and is now s/p aortogram that showed:  On the left: Widely patent common femoral artery, profunda, femoral to below-knee popliteal artery bypass. The superficial femoral artery was occluded. Distally, the bypass had a widely patent anastomosis with runoff into the below-knee popliteal artery. The anterior tibial artery was patent in its entirety. There was eccentric plaque in the distal tibioperoneal trunk, however this did not appear flow-limiting as flow into both the posterior tibial and peroneal arteries at the same velocities in the anterior tibial artery. There was three-vessel runoff. The foot fills via the dorsalis pedis and plantar arteries.   No intervention was performed.  Widely patent femoral to below-knee popliteal artery bypass with three-vessel runoff to the foot.    He is without without any claudication,rest pain or tissue loss.    The pt is on a statin for cholesterol management.    The pt is on an aspirin.  Other AC:  none The pt is on CCB, BB, ARB for hypertension.  The pt does  have diabetes. Tobacco hx:  former  Past Medical History:  Diagnosis Date   CAP (community acquired pneumonia) 10/11/2017   Coronary artery disease    a. Cath: 2000 nonobs dz. b. 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 s/p BMS to LAD & RCA ; s/p PCI/DES dRCA & PDA x2 2011. d. cath 02/17/14: LM patent, LAD calcified, diffuse irregs, no high  grade stenosis mLAD stent patent, LCx, diffuse irregs no high-grade dz, RCA total occ w/ L to R collats, medical Rx rec   Diabetes mellitus    Diastolic dysfunction    a. echo 02/18/14: EF 45-50%, mild LVH, AK of inferior and inferoseptal myocardium, GR1DD   Hiatal hernia    Hyperlipidemia    Hypertension    LV dysfunction    NSTEMI (non-ST elevated myocardial infarction) (HCC) 10/07/2017   PVD (peripheral vascular disease) (HCC)    Renal artery stenosis (HCC)    Mild, bilateral   S/P angioplasty with stent, atherectomy DES to pLAD and DES to mLAD 10/10/17  10/11/2017   Sleep apnea    Mild    Past Surgical History:  Procedure Laterality Date   ABDOMINAL AORTOGRAM W/LOWER EXTREMITY N/A 07/21/2020   Procedure: ABDOMINAL AORTOGRAM W/LOWER EXTREMITY;  Surgeon: [name], MD;  Location: [facility] INVASIVE CV LAB;  Service: Cardiovascular;  Laterality: N/A;   ABDOMINAL AORTOGRAM W/LOWER EXTREMITY Left 03/14/2023   Procedure: ABDOMINAL AORTOGRAM W/LOWER EXTREMITY;  Surgeon: Victorino Sparrow, MD;  Location: [facility] INVASIVE CV LAB;  Service: Cardiovascular;  Laterality: Left;   CATARACT EXTRACTION     CORONARY ATHERECTOMY N/A 10/10/2017   Procedure: CORONARY ATHERECTOMY - CSI;  Surgeon: Yvonne Kendall, MD;  Location: [facility] INVASIVE CV LAB;  Service: Cardiovascular;  Laterality: N/A;   CORONARY STENT INTERVENTION N/A 10/10/2017   Procedure: CORONARY STENT INTERVENTION;  Surgeon: Yvonne Kendall, MD;  Location: [facility] INVASIVE CV LAB;  Service: Cardiovascular;  Laterality: N/A;   CORONARY STENT INTERVENTION N/A 07/21/2018   Procedure: CORONARY STENT INTERVENTION;  Surgeon: Yvonne Kendall, MD;  Location: [facility] INVASIVE CV LAB;  Service: Cardiovascular;  Laterality: N/A;   CORONARY ULTRASOUND/IVUS N/A 10/09/2017   Procedure: Intravascular Ultrasound/IVUS;  Surgeon: [name], MD;  Location: [facility] INVASIVE CV LAB;  Service: Cardiovascular;  Laterality: N/A;   CORONARY ULTRASOUND/IVUS N/A 07/21/2018   Procedure:  Intravascular Ultrasound/IVUS;  Surgeon: Yvonne Kendall, MD;  Location: [facility] INVASIVE CV LAB;  Service: Cardiovascular;  Laterality: N/A;   FEMORAL-POPLITEAL BYPASS GRAFT Left 07/27/2020   Procedure: LEFT FEMORAL TO BELOW KNEE POPLITEAL ARTERY BYPASS;  Surgeon: [name], MD;  Location: [facility] OR;  Service: Vascular;  Laterality: Left;   HIATAL HERNIA REPAIR     LEFT HEART CATH AND CORONARY ANGIOGRAPHY N/A 10/09/2017   Procedure: LEFT HEART CATH AND CORONARY ANGIOGRAPHY;  Surgeon: [name], MD;  Location: [facility] INVASIVE CV LAB;  Service: Cardiovascular;  Laterality: N/A;   LEFT HEART CATH AND CORONARY ANGIOGRAPHY N/A 07/21/2018   Procedure: LEFT HEART CATH AND CORONARY ANGIOGRAPHY;  Surgeon: Yvonne Kendall, MD;  Location: [facility] INVASIVE CV LAB;  Service: Cardiovascular;  Laterality: N/A;   LEFT HEART CATHETERIZATION WITH CORONARY ANGIOGRAM N/A 02/17/2014   Procedure: LEFT HEART CATHETERIZATION WITH CORONARY ANGIOGRAM;  Surgeon: Micheline Chapman, MD;  Location: [facility] CATH LAB;  Service: Cardiovascular;  Laterality: N/A;   SHOULDER ARTHROSCOPY     Right   SOFT TISSUE TUMOR RESECTION     Vocal cord tumor, abdominal   SOFT TISSUE TUMOR RESECTION     Benign neck tumor   VEIN HARVEST Left 07/27/2020   Procedure: VEIN HARVEST USING  GREATER SAPHENOUS VEIN;  Surgeon: [name], MD;  Location: [facility] OR;  Service: Vascular;  Laterality: Left;   Vocal cord polyp removal      ROS:   General:  No weight loss, Fever, chills  HEENT: No recent headaches, no nasal bleeding, no visual changes, no sore throat  Neurologic: No dizziness, blackouts, seizures. No recent symptoms of stroke or mini- stroke. No recent episodes of slurred speech, or temporary blindness.  Cardiac: No recent episodes of chest pain/pressure, no shortness of breath at rest.  No shortness of breath with exertion.  Denies history of atrial fibrillation or irregular heartbeat  Vascular: No history of rest pain in feet.  No  history of claudication.  No history of non-healing ulcer, No history of DVT   Pulmonary: No home oxygen, no productive cough, no hemoptysis,  No asthma or wheezing  Musculoskeletal:  [ ]  Arthritis, [ ]  Low back pain,  [ ]  Joint pain  Hematologic:No history of hypercoagulable state.  No history of easy bleeding.  No history of anemia  Gastrointestinal: No hematochezia or melena,  No gastroesophageal reflux, no trouble swallowing  Urinary: [ ]  chronic Kidney disease, [ ]  on HD - [ ]  MWF or [ ]  TTHS, [ ]  Burning with urination, [ ]  Frequent urination, [ ]  Difficulty urinating;   Skin: No rashes  Psychological: No history of anxiety,  No history of depression  Social History Social History   Tobacco Use   Smoking status: Former    Current packs/day: 0.00    Types: Cigarettes    Quit date: 06/19/2003    Years since quitting: 20.2    Passive exposure: Never   Smokeless tobacco: Never  Vaping Use   Vaping status: Never Used  Substance Use Topics   Alcohol use:  No   Drug use: No    Family History Family History  Problem Relation Age of Onset   Heart attack Father 68   Cancer Mother    Cancer Brother     Allergies  Allergies  Allergen Reactions   Bee Venom Swelling and Other (See Comments)   Other Other (See Comments)   Penicillin G Other (See Comments)   Penicillins Itching and Swelling    Itching, swelling of the face/tongue/throat, SOB, or low BP   Plavix [Clopidogrel Bisulfate] Other (See Comments)    Patient is noted to be a nonresponder to Plavix     Current Outpatient Medications  Medication Sig Dispense Refill   allopurinol (ZYLOPRIM) 100 MG tablet Take 100 mg by mouth every morning.     amLODipine (NORVASC) 10 MG tablet Take 1 tablet (10 mg total) by mouth every morning.     aspirin 81 MG tablet Take 81 mg by mouth every morning.     atorvastatin (LIPITOR) 80 MG tablet Take 1 tablet (80 mg total) by mouth at bedtime.     carvedilol (COREG) 25 MG tablet  TAKE 1 TABLET BY MOUTH TWO TIMES DAILY WITH A MEAL (Patient taking differently: Take 25 mg by mouth in the morning and at bedtime.) 180 tablet 3   chlorthalidone (HYGROTON) 25 MG tablet TAKE 1 TABLET BY MOUTH EVERY DAY 90 tablet 1   empagliflozin (JARDIANCE) 25 MG TABS tablet Take 25 mg by mouth daily.     fenofibrate (TRICOR) 145 MG tablet Take 1 tablet (145 mg total) by mouth daily. 90 tablet 3   isosorbide mononitrate (IMDUR) 120 MG 24 hr tablet Take 1 tablet (120 mg total) by mouth at bedtime.     losartan (COZAAR) 100 MG tablet Take 100 mg by mouth every morning.      metFORMIN (GLUCOPHAGE-XR) 500 MG 24 hr tablet Take 500 mg by mouth 2 (two) times daily.     nitroGLYCERIN (NITROSTAT) 0.4 MG SL tablet Place 1 tablet (0.4 mg total) under the tongue every 5 (five) minutes as needed for chest pain. 25 tablet 3   omeprazole (PRILOSEC) 20 MG capsule Take 20 mg by mouth every morning.      TOUJEO MAX SOLOSTAR 300 UNIT/ML Solostar Pen Inject 100 Units into the skin daily.     No current facility-administered medications for this visit.    Physical Examination  Vitals:   09/17/23 1019  BP: (!) 177/85  Pulse: 95  Resp: 18  Temp: 97.9 F (36.6 C)  TempSrc: Temporal  SpO2: 98%  Weight: 189 lb 12.8 oz (86.1 kg)  Height: 5\' 11"  (1.803 m)    Body mass index is 26.47 kg/m.  General:  Alert and oriented, no acute distress HEENT: Normal Neck: No bruit or JVD Pulmonary: Clear to auscultation bilaterally Cardiac: Regular Rate and Rhythm without murmur Abdomen: Soft, non-tender, non-distended, no mass, no scars Skin: No rash, no non healing wounds Extremity Pulses: B femoral  pulses bilaterally, non palpable pedal pulses. Musculoskeletal: No deformity or edema  Neurologic: Upper and lower extremity motor 5/5 and symmetric  DATA:      ABI Findings:  +---------+------------------+-----+----------+  Right   Rt Pressure (mmHg)IndexWaveform     +---------+------------------+-----+----------+  Brachial 173                                +---------+------------------+-----+----------+  PTA     123  0.70 monophasic  +---------+------------------+-----+----------+  DP      125               0.71 monophasic  +---------+------------------+-----+----------+  Great Toe31                0.18 Abnormal    +---------+------------------+-----+----------+   +---------+------------------+-----+--------+  Left    Lt Pressure (mmHg)IndexWaveform  +---------+------------------+-----+--------+  Brachial 175                              +---------+------------------+-----+--------+  PTA     255               1.46 biphasic  +---------+------------------+-----+--------+  DP      133               0.76 biphasic  +---------+------------------+-----+--------+  Great Toe118               0.67           +---------+------------------+-----+--------+    +-------+-----------+-----------+------------+------------+  ABI/TBIToday's ABIToday's TBIPrevious ABIPrevious TBI  +-------+-----------+-----------+------------+------------+  Right 0.71       0.18       0.94        0             +-------+-----------+-----------+------------+------------+  Left  [place]         0.67       [place]          0.32          +-------+-----------+-----------+------------+------------+     Summary:  Right: Resting right ankle-brachial index indicates moderate right lower  extremity arterial disease. The right toe-brachial index is abnormal.   Left: Resting left ankle-brachial index indicates noncompressible left  lower extremity arteries. The left toe-brachial index is abnormal.   Right ABIs appear decreased. Left ABIs appear essentially unchanged.    +-----------+--------+-----+--------+----------+--------+  LEFT      PSV cm/sRatioStenosisWaveform  Comments   +-----------+--------+-----+--------+----------+--------+  ATA Distal 60                   biphasic            +-----------+--------+-----+--------+----------+--------+  PTA Distal 48                   monophasic          +-----------+--------+-----+--------+----------+--------+  PERO Distal0                    Absent              +-----------+--------+-----+--------+----------+--------+     Left Graft #1: Left Femoral-BK Popliteal Artery BPG  +--------------------+--------+--------+--------+----------+                     PSV cm/sStenosisWaveformComments    +--------------------+--------+--------+--------+----------+  Inflow             61              biphasic            +--------------------+--------+--------+--------+----------+  Proximal Anastomosis80              biphasic            +--------------------+--------+--------+--------+----------+  Proximal Graft      84              biphasic            +--------------------+--------+--------+--------+----------+  Mid Graft  90              biphasic            +--------------------+--------+--------+--------+----------+  Distal Graft        128             biphasic            +--------------------+--------+--------+--------+----------+  Distal Anastomosis  98              biphasic            +--------------------+--------+--------+--------+----------+  Outflow            12                      Retrograde  +--------------------+--------+--------+--------+----------+     Summary:  Left:  - Patent femoral to below popliteal artery bypass graft with retrograde  flow observed at graft outflow.  - No flow observed at the distal peroneal artery.  - Biphasic-monophasic flow is observed at the distal anterior and  posterior tibial arteries.   ASSESSMENT/PLAN:  PAD with previous history of left-sided femoral to below-knee popliteal artery bypass using  greater saphenous vein.  There was a question about the outflow of the bypass so he was taken for a diagnostic angiogram.  The angiogram showed that the bypass had a widely patent anastomosis with runoff into the below-knee popliteal artery. The anterior tibial artery was patent in its entirety. There was eccentric plaque in the distal tibioperoneal trunk, however this did not appear flow-limiting as flow into both the posterior tibial and peroneal arteries at the same velocities in the anterior tibial artery. There was three-vessel runoff. The foot fills via the dorsalis pedis and plantar arteries.    He has no new ischemic changes.  He denies rest pain, claudication and non healing wounds.  The ABI's are unchanged with biphasic waveforms on the left and monophasic on the right they are unchanged from previous study.  The arterial duplex is reported to have moderate PAD with abnormal TBI of 0.18 and was absent on previous study.     We discussed that he will continue to stay active and continue to elevate his legs to control the edema.  He does not tolerate compression.  He will continue medical management With ASA and Statin daily.  F/U in 9 months for repeat studies.         Mosetta Pigeon PA-C [facility] [place] Office: [phone]  MD in clinic [place]

## 2023-10-08 NOTE — Progress Notes (Unsigned)
 Cardiology Office Note:   Date:  10/10/2023  ID:  [name] [name], [name] [name] [birth year], MRN [id number] PCP: Lenn Quint, NP  [facility] Providers Cardiologist:  Eilleen Grates, MD {  History of Present Illness:   [name] [name] is a 78 y.o. male who presents for for follow up of his CAD.  He was admitted Sept 2015 with non-ST elevation MI. He was found to have an occluded right coronary artery but patent LAD stents. He was managed medically.   He was in the hospital in April 2019.  He had pneumonia.  He had C. difficile.  He also had non-Q wave MI with disease as listed below and ended up with staged PCI.  Ejection fraction was 45% which is essentially unchanged from previous.  He saw Dr. Alvenia Aus with leg pain and PVD.    Feb 2020 he did have increasing shortness of breath and some chest discomfort.  He was diagnosed with NSTEMI.   He underwent PCI as below by Dr. Nolan Battle at that time. He has had lower extremity bypass.     Since I last saw him he has had no new cardiac issues.  He does have foot pain and he did recently saw vascular surgery.  He has a patent femoral to below the popliteal artery bypass graft with retrograde flow.  There is no flow observed in the distal peroneal artery.  Biphasic flow is observed in the distal anterior and posterior tibial arteries.    ROS: As stated in the HPI and negative for all other systems.  Studies Reviewed:    EKG:        Risk Assessment/Calculations:             Physical Exam:   VS:  BP 108/60   Pulse 96   Ht 5\' 10"  (1.778 m)   Wt 195 lb (88.5 kg)   BMI 27.98 kg/m    Wt Readings from Last 3 Encounters:  10/09/23 195 lb (88.5 kg)  09/17/23 189 lb 12.8 oz (86.1 kg)  07/11/23 190 lb (86.2 kg)     GEN: Well nourished, well developed in no acute distress NECK: No JVD; No carotid bruits CARDIAC: RRR, no murmurs, rubs, gallops RESPIRATORY:  Clear to auscultation without rales, wheezing or rhonchi  ABDOMEN: Soft,  non-tender, non-distended EXTREMITIES:  No edema; No deformity   ASSESSMENT AND PLAN:   CAD:   The patient has no new sypmtoms.  No further cardiovascular testing is indicated.  We will continue with aggressive risk reduction and meds as listed.   HTN:   The blood pressure is at target.  No change in therapy.    DM:   A1c is 9.5.  It was 10.4. He said his insulin  was increased at that time.  Therapy is per Lenn Quint, NP   PVD:   He needs continued management by VVS and risk reduction.  No further intervention is planned.  DYSLIPIDEMIA:   I was able to get his lipid profile and this was done earlier this month.  His triglycerides were 837.  His LDL was not accurate as it was not a direct LDL.  I am going to switch him to Crestor .  He is already taking fenofibrate .  He needs to have his blood sugar controlled.  He needs a lipid profile again in 3 months.  He will stop his Lipitor .   BRUIT:   This was mild in 2019.  No change in therapy.  Follow up  with me in 1 year or sooner if needed.  Signed, Eilleen Grates, MD

## 2023-10-09 ENCOUNTER — Encounter: Payer: Self-pay | Admitting: Cardiology

## 2023-10-09 ENCOUNTER — Other Ambulatory Visit: Payer: Self-pay | Admitting: *Deleted

## 2023-10-09 ENCOUNTER — Ambulatory Visit (INDEPENDENT_AMBULATORY_CARE_PROVIDER_SITE_OTHER): Payer: Medicare Other | Admitting: Cardiology

## 2023-10-09 VITALS — BP 108/60 | HR 96 | Ht 70.0 in | Wt 195.0 lb

## 2023-10-09 DIAGNOSIS — Z79899 Other long term (current) drug therapy: Secondary | ICD-10-CM

## 2023-10-09 DIAGNOSIS — E119 Type 2 diabetes mellitus without complications: Secondary | ICD-10-CM

## 2023-10-09 DIAGNOSIS — E785 Hyperlipidemia, unspecified: Secondary | ICD-10-CM | POA: Diagnosis not present

## 2023-10-09 DIAGNOSIS — Z794 Long term (current) use of insulin: Secondary | ICD-10-CM

## 2023-10-09 DIAGNOSIS — I10 Essential (primary) hypertension: Secondary | ICD-10-CM | POA: Diagnosis not present

## 2023-10-09 DIAGNOSIS — I25119 Atherosclerotic heart disease of native coronary artery with unspecified angina pectoris: Secondary | ICD-10-CM

## 2023-10-09 MED ORDER — ROSUVASTATIN CALCIUM 40 MG PO TABS: 40.0000 mg | ORAL_TABLET | Freq: Every day | ORAL | 3 refills | Status: AC

## 2023-10-09 NOTE — Patient Instructions (Addendum)
 Medication Instructions:  Please discontinue your Atorvastatin  and start Crestor  40 mg once a day. Continue all other medications as listed.  *If you need a refill on your cardiac medications before your next appointment, please call your pharmacy*  Lab: Please have blood work at your closest [facility] (direct LDL)  Follow-Up: At [facility], you and your health needs are our priority.  As part of our continuing mission to provide you with exceptional heart care, our providers are all part of one team.  This team includes your primary Cardiologist (physician) and Advanced Practice Providers or APPs (Physician Assistants and Nurse Practitioners) who all work together to provide you with the care you need, when you need it.  Your next appointment:   1 year(s)  Provider:   Eilleen Grates, MD    We recommend signing up for the patient portal called "MyChart".  Sign up information is provided on this After Visit Summary.  MyChart is used to connect with patients for Virtual Visits (Telemedicine).  Patients are able to view lab/test results, encounter notes, upcoming appointments, etc.  Non-urgent messages can be sent to your provider as well.   To learn more about what you can do with MyChart, go to [email or web address].

## 2023-10-10 ENCOUNTER — Encounter: Payer: Self-pay | Admitting: Cardiology

## 2023-10-12 LAB — LDL CHOLESTEROL, DIRECT: LDL Direct: 72 mg/dL (ref 0–99)

## 2023-10-15 ENCOUNTER — Encounter: Payer: Self-pay | Admitting: *Deleted

## 2024-02-11 ENCOUNTER — Encounter (HOSPITAL_COMMUNITY): Payer: Self-pay

## 2024-02-11 ENCOUNTER — Emergency Department (HOSPITAL_COMMUNITY): Admission: EM | Admit: 2024-02-11 | Discharge: 2024-02-11 | Disposition: A | Discharge status: Home / Self Care

## 2024-02-11 ENCOUNTER — Other Ambulatory Visit: Payer: Self-pay

## 2024-02-11 ENCOUNTER — Emergency Department (HOSPITAL_COMMUNITY)

## 2024-02-11 DIAGNOSIS — Z79899 Other long term (current) drug therapy: Secondary | ICD-10-CM | POA: Diagnosis not present

## 2024-02-11 DIAGNOSIS — R079 Chest pain, unspecified: Secondary | ICD-10-CM | POA: Insufficient documentation

## 2024-02-11 DIAGNOSIS — D72829 Elevated white blood cell count, unspecified: Secondary | ICD-10-CM | POA: Insufficient documentation

## 2024-02-11 DIAGNOSIS — I10 Essential (primary) hypertension: Secondary | ICD-10-CM | POA: Diagnosis not present

## 2024-02-11 DIAGNOSIS — I2510 Atherosclerotic heart disease of native coronary artery without angina pectoris: Secondary | ICD-10-CM | POA: Insufficient documentation

## 2024-02-11 DIAGNOSIS — R059 Cough, unspecified: Secondary | ICD-10-CM | POA: Diagnosis not present

## 2024-02-11 DIAGNOSIS — R0602 Shortness of breath: Secondary | ICD-10-CM | POA: Insufficient documentation

## 2024-02-11 DIAGNOSIS — E119 Type 2 diabetes mellitus without complications: Secondary | ICD-10-CM | POA: Insufficient documentation

## 2024-02-11 DIAGNOSIS — Z7984 Long term (current) use of oral hypoglycemic drugs: Secondary | ICD-10-CM | POA: Diagnosis not present

## 2024-02-11 DIAGNOSIS — Z7901 Long term (current) use of anticoagulants: Secondary | ICD-10-CM | POA: Diagnosis not present

## 2024-02-11 DIAGNOSIS — E876 Hypokalemia: Secondary | ICD-10-CM | POA: Insufficient documentation

## 2024-02-11 LAB — BASIC METABOLIC PANEL WITH GFR
Anion gap: 15 (ref 5–15)
BUN: 17 mg/dL (ref 8–23)
CO2: 22 mmol/L (ref 22–32)
Calcium: 9.3 mg/dL (ref 8.9–10.3)
Chloride: 102 mmol/L (ref 98–111)
Creatinine, Ser: 0.94 mg/dL (ref 0.61–1.24)
GFR, Estimated: >60 mL/min (ref >60)
Glucose, Bld: 287 mg/dL — ABNORMAL HIGH (ref 70–99)
Potassium: 3.3 mmol/L — ABNORMAL LOW (ref 3.5–5.1)
Sodium: 139 mmol/L (ref 135–145)

## 2024-02-11 LAB — CBC
HCT: 47.6 % (ref 39.0–52.0)
Hemoglobin: 15.6 g/dL (ref 13.0–17.0)
MCH: 28.1 pg (ref 26.0–34.0)
MCHC: 32.8 g/dL (ref 30.0–36.0)
MCV: 85.8 fL (ref 80.0–100.0)
Platelets: 192 K/uL (ref 150–400)
RBC: 5.55 MIL/uL (ref 4.22–5.81)
RDW: 14.1 % (ref 11.5–15.5)
WBC: 11.3 K/uL — ABNORMAL HIGH (ref 4.0–10.5)
nRBC: 0 % (ref 0.0–0.2)

## 2024-02-11 LAB — TROPONIN I (HIGH SENSITIVITY)
Troponin I (High Sensitivity): 28 ng/L — ABNORMAL HIGH (ref <18)
Troponin I (High Sensitivity): 26 ng/L — ABNORMAL HIGH (ref <18)

## 2024-02-11 LAB — RESP PANEL BY RT-PCR (RSV, FLU A&B, COVID)  RVPGX2
Influenza A by PCR: NEGATIVE
Influenza B by PCR: NEGATIVE
Resp Syncytial Virus by PCR: NEGATIVE
SARS Coronavirus 2 by RT PCR: NEGATIVE

## 2024-02-11 MED ORDER — DOXYCYCLINE HYCLATE 100 MG PO CAPS: 100.0000 mg | ORAL_CAPSULE | Freq: Two times a day (BID) | ORAL | 0 refills | Status: DC

## 2024-02-11 MED ORDER — IPRATROPIUM-ALBUTEROL 0.5-2.5 (3) MG/3ML IN SOLN
3.0000 mL | Freq: Once | RESPIRATORY_TRACT | Status: AC
  Administered 2024-02-11: 3 mL via RESPIRATORY_TRACT
  Filled 2024-02-11: qty 3

## 2024-02-11 MED ORDER — PREDNISONE 50 MG PO TABS: 50.0000 mg | ORAL_TABLET | Freq: Every day | ORAL | 0 refills | Status: AC

## 2024-02-11 MED ORDER — IOHEXOL 350 MG/ML SOLN
75.0000 mL | Freq: Once | INTRAVENOUS | Status: AC | PRN
  Administered 2024-02-11: 75 mL via INTRAVENOUS

## 2024-02-11 MED ORDER — METHYLPREDNISOLONE SODIUM SUCC 125 MG IJ SOLR
125.0000 mg | Freq: Once | INTRAMUSCULAR | Status: AC
Start: 2024-02-11 — End: 2024-02-11
  Administered 2024-02-11: 125 mg via INTRAVENOUS
  Filled 2024-02-11: qty 2

## 2024-02-11 NOTE — ED Notes (Signed)
 Patient ambulated around Nurses station approximately 174ft, room air patient oxygen stayed at 95% and pulse varied from 95-105. Patient denies any pain of shortness of breath. Patient currently on room air in room. Provider notified.

## 2024-02-11 NOTE — Discharge Instructions (Addendum)
 You were seen today for cough, left-sided chest pain and accompanying shortness of breath.  Suspecting this might be secondary to a developing pneumonia versus COPD exacerbation.  For this reason I am going to send you home with a short course of steroids as well as short course of antibiotics.  You will need to follow-up with cardiology for reevaluation, please call their office and schedule an ER follow-up with this shortness of breath and chest pain that you experience today. Would also like you to follow-up with PCP for continued evaluations for CT findings.  Please return to the ED though if he did not have any recurrence or new or worsening symptoms.

## 2024-02-11 NOTE — ED Triage Notes (Signed)
 Patient come in POV for complaint of cough, shortness of breath, pain in left side of his chest. Has had the symptoms for three days. 5/10 pain. Coughing causes more pain to chest.

## 2024-02-11 NOTE — ED Notes (Signed)
 Patient transported to CT

## 2024-02-11 NOTE — ED Provider Notes (Signed)
 [facility] AT [facility] Provider Note   CSN: [id number] Arrival date & time: 02/11/24  9147     Patient presents with: Cough, Chest Pain, and Shortness of Breath   [name] [name] is a 78 y.o. male.    Cough Associated symptoms: chest pain and shortness of breath   Chest Pain Associated symptoms: cough and shortness of breath   Shortness of Breath Associated symptoms: chest pain and cough    Patient is a 78 year old male who presented to ED today for 3-day history of cough, left-sided chest pain and accompanying shortness of breath.  Reports the chest pain feels sharp from left side and going to back, noting that this feels very similar to the previous time she has had pneumonia.  Also has reportedly had his granddaughter living with them who had been having URI symptoms.  Endorses body aches and chills with subjective fevers.  Medical history of NSTEMI, CAD, PVD with chronic left leg swelling, diabetes, renal artery stenosis, hiatal hernia, HLD, HTN.  Has reportedly taken Tylenol  last night with some relief.  Denies fever, headache, vision changes, hemoptysis, congestion, abdominal pain, nausea, vomiting, diarrhea, hematochezia, melena, dysuria, worsening lower leg swelling.    Prior to Admission medications   Medication Sig Start Date End Date Taking? Authorizing Provider  doxycycline  (VIBRAMYCIN ) 100 MG capsule Take 1 capsule (100 mg total) by mouth 2 (two) times daily. 02/11/24  Yes Kristan Brummitt S, PA-C  predniSONE  (DELTASONE ) 50 MG tablet Take 1 tablet (50 mg total) by mouth daily for 5 days. 02/11/24 02/16/24 Yes Zykeriah Mathia S, PA-C  allopurinol  (ZYLOPRIM ) 100 MG tablet Take 100 mg by mouth every morning.    [provider]  amLODipine  (NORVASC ) 10 MG tablet Take 1 tablet (10 mg total) by mouth every morning. 05/23/23   Johnson, Clanford L, MD  aspirin  81 MG tablet Take 81 mg by mouth every morning.    [provider]  carvedilol  (COREG ) 25 MG tablet TAKE 1 TABLET BY MOUTH TWO TIMES DAILY WITH A MEAL Patient taking differently: Take 25 mg by mouth in the morning and at bedtime. 08/12/19   Darron Deatrice LABOR, MD  chlorthalidone  (HYGROTON ) 25 MG tablet TAKE 1 TABLET BY MOUTH EVERY DAY 08/02/22   Lavona Agent, MD  empagliflozin  (JARDIANCE ) 25 MG TABS tablet Take 25 mg by mouth daily.    [provider]  fenofibrate  (TRICOR ) 145 MG tablet Take 1 tablet (145 mg total) by mouth daily. 07/11/23   Jerilynn Lamarr HERO, NP  isosorbide  mononitrate (IMDUR ) 120 MG 24 hr tablet Take 1 tablet (120 mg total) by mouth at bedtime. 05/23/23   Johnson, Clanford L, MD  losartan  (COZAAR ) 100 MG tablet Take 100 mg by mouth every morning.     [provider]  metFORMIN  (GLUCOPHAGE -XR) 500 MG 24 hr tablet Take 500 mg by mouth 2 (two) times daily.    [provider]  nitroGLYCERIN  (NITROSTAT ) 0.4 MG SL tablet Place 1 tablet (0.4 mg total) under the tongue every 5 (five) minutes as needed for chest pain. 05/23/23   Johnson, Clanford L, MD  omeprazole (PRILOSEC) 20 MG capsule Take 20 mg by mouth every morning.     [provider]  rosuvastatin  (CRESTOR ) 40 MG tablet Take 1 tablet (40 mg total) by mouth daily. 10/09/23 01/07/24  Lavona Agent, MD  TOUJEO  MAX SOLOSTAR 300 UNIT/ML Solostar Pen Inject 100 Units into the skin daily. 03/01/22   [provider]  Allergies: Bee venom, Other, Penicillin g, Penicillins, and Plavix  [clopidogrel  bisulfate]    Review of Systems  Respiratory:  Positive for cough and shortness of breath.   Cardiovascular:  Positive for chest pain.  All other systems reviewed and are negative.   Updated Vital Signs BP 121/63   Pulse 87   Temp 99 F (37.2 C) (Oral)   Resp 17   Ht 5' 11 (1.803 m)   Wt 90.7 kg   SpO2 91%   BMI 27.89 kg/m   Physical Exam Vitals and nursing note reviewed.  Constitutional:      General: He is not in acute distress.     Appearance: Normal appearance. He is not ill-appearing or diaphoretic.  HENT:     Head: Normocephalic and atraumatic.  Eyes:     General: No scleral icterus.       Right eye: No discharge.        Left eye: No discharge.     Extraocular Movements: Extraocular movements intact.     Conjunctiva/sclera: Conjunctivae normal.  Neck:     Vascular: No hepatojugular reflux or JVD.  Cardiovascular:     Rate and Rhythm: Normal rate and regular rhythm.     Pulses: Normal pulses.     Heart sounds: Normal heart sounds. Heart sounds not distant. No murmur heard.    No systolic murmur is present.     No friction rub. No gallop.  Pulmonary:     Effort: Pulmonary effort is normal. No respiratory distress.     Breath sounds: No stridor. Examination of the left-lower field reveals rales. Rales present. No wheezing or rhonchi.  Chest:     Chest wall: No tenderness.  Abdominal:     General: Abdomen is flat. There is no distension.     Palpations: Abdomen is soft.     Tenderness: There is no abdominal tenderness. There is no right CVA tenderness, left CVA tenderness, guarding or rebound.  Musculoskeletal:        General: No swelling, deformity or signs of injury.     Cervical back: Normal range of motion. No rigidity.     Right lower leg: No edema.     Left lower leg: Edema (noted to have chronic LLE edema. Not worse than baseline, 2+ pitting) present.  Skin:    General: Skin is warm and dry.     Findings: No bruising, erythema or lesion.  Neurological:     General: No focal deficit present.     Mental Status: He is alert and oriented to person, place, and time. Mental status is at baseline.     Sensory: No sensory deficit.     Motor: No weakness.  Psychiatric:        Mood and Affect: Mood normal.     (all labs ordered are listed, but only abnormal results are displayed) Labs Reviewed  BASIC METABOLIC PANEL WITH GFR - Abnormal; Notable for the following components:      Result Value    Potassium 3.3 (*)    Glucose, Bld 287 (*)    All other components within normal limits  CBC - Abnormal; Notable for the following components:   WBC 11.3 (*)    All other components within normal limits  TROPONIN I (HIGH SENSITIVITY) - Abnormal; Notable for the following components:   Troponin I (High Sensitivity) 28 (*)    All other components within normal limits  TROPONIN I (HIGH SENSITIVITY) - Abnormal; Notable for the following components:  Troponin I (High Sensitivity) 26 (*)    All other components within normal limits  RESP PANEL BY RT-PCR (RSV, FLU A&B, COVID)  [id number]    EKG: EKG Interpretation Date/Time:  Tuesday February 11 2024 09:08:11 EDT Ventricular Rate:  96 PR Interval:  153 QRS Duration:  105 QT Interval:  376 QTC Calculation: 476 R Axis:   74  Text Interpretation: Sinus rhythm  T wave inversions in inferior and lateral leads similar to previous EKGs Confirmed by Ula Barter [phone]) on 02/11/2024 1:50:48 PM  Radiology: CT Angio Chest PE W and/or Wo Contrast Result Date: 02/11/2024 CLINICAL DATA:  Pulmonary embolism (PE) suspected, high prob. Cough. Shortness of breath. EXAM: CT ANGIOGRAPHY CHEST WITH CONTRAST TECHNIQUE: Multidetector CT imaging of the chest was performed using the standard protocol during bolus administration of intravenous contrast. Multiplanar CT image reconstructions and MIPs were obtained to evaluate the vascular anatomy. RADIATION DOSE REDUCTION: This exam was performed according to the departmental dose-optimization program which includes automated exposure control, adjustment of the mA and/or kV according to patient size and/or use of iterative reconstruction technique. CONTRAST:  75mL OMNIPAQUE  IOHEXOL  350 MG/ML SOLN COMPARISON:  CT angiography chest from 05/22/2023. FINDINGS: Cardiovascular: No evidence of embolism to the proximal subsegmental pulmonary artery level. Normal cardiac size. No pericardial effusion. No aortic aneurysm. There are  coronary artery calcifications, in keeping with coronary artery disease. There are also moderate peripheral atherosclerotic vascular calcifications of thoracic aorta and its major branches. Mediastinum/Nodes: Redemonstration of a well-circumscribed 1.1 x 1.6 cm hypoattenuating nodule in the inferior left thyroid  lobe, incompletely characterized on the current exam but essentially unchanged since the prior study. The nodule was characterized on the prior ultrasound from 05/22/2023. Please see the ultrasound report for details. Note is made of surgically absent right thyroid  lobe. No solid / cystic mediastinal masses. The esophagus is nondistended precluding optimal assessment. Redemonstration of multiple mildly enlarged mediastinal and bilateral hilar lymph nodes, which overall appears slightly decreased in size since the prior study. These are indeterminate in etiology but favored benign/reactive. No axillary lymphadenopathy by size criteria. Lungs/Pleura: The central tracheo-bronchial tree is patent. There is mild, smooth, circumferential thickening of the segmental and subsegmental bronchial walls, throughout bilateral lungs, which is nonspecific. Findings are most commonly seen with bronchitis or reactive airway disease, such as asthma. There is new small left pleural effusion with associated atelectatic changes in the left lung lower lobe. No mass or consolidation. No right pleural effusion. No suspicious lung nodules. There is a stable sub 5 mm solid noncalcified opacity in the right major fissure (series 6, image 87), favored to represent intra fissural lymph node. Upper Abdomen: Scattered punctate calcified granulomas noted in the liver. Remaining visualized upper abdominal viscera within normal limits. Musculoskeletal: The visualized soft tissues of the chest wall are grossly unremarkable. No suspicious osseous lesions. There are mild multilevel degenerative changes in the visualized spine. Review of the MIP  images confirms the above findings. IMPRESSION: 1. No embolism to the proximal subsegmental pulmonary artery level. 2. New small left pleural effusion with associated atelectatic changes in the left lung lower lobe. 3. Multiple other nonacute observations, as described above. Aortic Atherosclerosis (ICD10-I70.0). Electronically Signed   By: Ree Molt M.D.   On: 02/11/2024 11:29   DG Chest 2 View Result Date: 02/11/2024 EXAM: 2 VIEW(S) XRAY OF THE CHEST 02/11/2024 09:30:27 AM COMPARISON: 05/23/2023 CLINICAL HISTORY: Chest pain. Complaint of cough, shortness of breath, pain in left side of his chest. Has had the  symptoms for three days. FINDINGS: LUNGS AND PLEURA: Left lung base airspace opacity, likely atelectasis. Small left pleural effusion. No pneumothorax. HEART AND MEDIASTINUM: Coronary artery stents noted. Aortic arch calcifications. No acute abnormality of the cardiac and mediastinal silhouettes. BONES AND SOFT TISSUES: No acute osseous abnormality. IMPRESSION: 1. Left lung base opacity, likely atelectasis. 2. Small left pleural effusion. Electronically signed by: Waddell Calk MD 02/11/2024 10:31 AM EDT RP Workstation: [id number]     Procedures   Medications Ordered in the ED  iohexol  (OMNIPAQUE ) 350 MG/ML injection 75 mL (75 mLs Intravenous Contrast Given 02/11/24 1055)  ipratropium-albuterol  (DUONEB) 0.5-2.5 (3) MG/3ML nebulizer solution 3 mL (3 mLs Nebulization Given 02/11/24 1238)  methylPREDNISolone  sodium succinate (SOLU-MEDROL ) 125 mg/2 mL injection 125 mg (125 mg Intravenous Given 02/11/24 1223)                                    Medical Decision Making Amount and/or Complexity of Data Reviewed Labs: ordered. Radiology: ordered.  Risk Prescription drug management.   This patient is a 78 year old male who presents to the ED for concern of left-sided chest pain, shortness of breath and cough, noted to have a sick contact of granddaughter who was at home, with patient stating  this pain is feels very similar to previous pneumonia.  States that this does not feel similar to his previous MI.  Has not taken any Tylenol  today.  On physical exam, patient is in no acute distress, afebrile, alert and orient x 4, speaking in full sentences, nontachypneic, nontachycardic.  Notably does have mild rales noted to left lower lobe of lung, but otherwise no other acute findings.  Chronic left lower extremity edema present.  2+.  With patient's current symptoms, will obtain baseline labs for atypical chest pain and imaging.  X-ray did show pleural effusion versus atelectasis.  However with patient's current symptoms and first troponin being elevated, we will obtain CT PE study with him having chronic left lower extremity swelling.  Second opponent was flat.   On reevaluation, lungs sound had improved with DuoNebs.  CT PE study confirmed small pleural effusion.  Suspect that patient might be having possible COPD exacerbation with patient's 40 years of previous smoking history.  Will treat with methylprednisone, DuoNebs and ambulate him.  Patient was able to ambulate with maintaining O2 saturations and without any recurrence of symptoms.  Will send home with prednisone , doxycycline .  As well as have him continue to follow-up with his cardiologist which he is already planning on seeing.  Will also have him follow-up with PCP for CT findings.  Case discussed with attending who agreed with plan.  Patient vital signs have remained stable throughout the course of patient's time in the ED. Low suspicion for any other emergent pathology at this time. I believe this patient is safe to be discharged. Provided strict return to ER precautions. Patient expressed agreement and understanding of plan. All questions were answered.  Differential diagnoses prior to evaluation: The emergent differential diagnosis includes, but is not limited to, ACS, AAS, Pulmonary Embolism, Tension Pneumothorax, Esophageal  Rupture, Cardiac Tamponade, Pericarditis, Myocarditis, Pneumothorax, Pneumonia, Aortic Stenosis, CHF Exacerbation, GERD,  Esophageal Spasm,  Mallory-Weiss, Costochondritis, Musculoskeletal Chest Wall Pain, Anxiety / Panic Attack. This is not an exhaustive differential.  Testing Past Medical History / Co-morbidities / Social History: Type 2 diabetes, HLD, CAD, renal artery stenosis, NSTEMI, hiatal hernia, PVD  Additional history: Chart reviewed.  Pertinent results include:   Noted to have an EF of 40 to 45% on 05/2023 with mildly decreased LV function.  Lab Tests/Imaging studies: I personally interpreted labs/imaging and the pertinent results include:   CT shows a mildly elevated white count 11.3 but otherwise unremarkable BMP noted to have a mild hypokalemia 3.3 and elevated glucose Troponin initially was 24 with delta of 26 Respiratory panel negative Chest x-ray shows pleural effusions versus atelectasis of left lower lung. CT PE study shows new small pleural effusion associated with atelectasis changes of left lower lung, no other acute findings.  I agree with the radiologist interpretation.  Cardiac monitoring: EKG obtained and interpreted by myself and attending physician which shows: Sinus rhythm with T wave inversions, similar to previous ECGs  EKG Interpretation Date/Time:  Tuesday February 11 2024 09:08:11 EDT Ventricular Rate:  96 PR Interval:  153 QRS Duration:  105 QT Interval:  376 QTC Calculation: 476 R Axis:   74  Text Interpretation: Sinus rhythm  T wave inversions in inferior and lateral leads similar to previous EKGs Confirmed by Ula Barter [phone]) on 02/11/2024 1:50:48 PM          Medications: I ordered medication including DuoNebs, doxycycline , prednisone , methylprednisone.  I have reviewed the patients home medicines and have made adjustments as needed.  Critical Interventions: None  Social Determinants of Health: Has good follow-up with  cardiology  Disposition: After consideration of the diagnostic results and the patients response to treatment, I feel that the patient would benefit from discharge return as above.   emergency department workup does not suggest an emergent condition requiring admission or immediate intervention beyond what has been performed at this time. The plan is: Follow-up with cardiology, follow-up PCP, treat for possible COPD exacerbation with antibiotics and prednisone . The patient is safe for discharge and has been instructed to return immediately for worsening symptoms, change in symptoms or any other concerns.   Final diagnoses:  Chest pain, unspecified type  Shortness of breath    ED Discharge Orders          Ordered    doxycycline  (VIBRAMYCIN ) 100 MG capsule  2 times daily        02/11/24 1351    predniSONE  (DELTASONE ) 50 MG tablet  Daily        02/11/24 1351               Beola Terrall RAMAN, PA-C 02/11/24 1425    Ula Barter SAUNDERS, MD 02/11/24 [phone]

## 2024-02-11 NOTE — ED Notes (Signed)
 Patient put on 2L o2 via nasal cannula, due to oxygen level droped to 89% and patient feeling short of breath. while laying in the bed. Provider notified.

## 2024-02-11 NOTE — ED Notes (Signed)
 ED Provider at bedside.

## 2024-02-11 NOTE — ED Notes (Signed)
 Patient transported to X-ray

## 2024-02-11 NOTE — ED Notes (Signed)
 Patient ambulated to the restroom

## 2024-02-13 NOTE — Progress Notes (Unsigned)
 Cardiology Office Note:    Date:  02/14/2024   ID:  [name] [name], DOB December 08, 1945, MRN [id number]  PCP:  Renato Dorothey HERO, NP   [facility] Providers Cardiologist:  Lynwood Schilling, MD     Referring MD: Renato Dorothey HERO, NP   Chief complaint: Emergency department follow-up  History of Present Illness:    [name] [name] is a 78 y.o. male with a hx of CAD, hyperlipidemia, COPD, renal artery stenosis, PVD, NSTEMI, hypertension presenting to the office for follow-up after visiting the emergency department on 02/11/2024 for chest pain, shortness of breath, cough.  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 2019: Admitted 4/22-4/26 w/ acute hypoxic respiratory failure, CAP, pulmonary edema requiring BiPAP, ST depression, HR 144.  EF 45-50% by echo.  4/24 cardiac cath PCI LAD w/ atherectomy.  Cannot afford Effient , nonresponder to Plavix , loaded with Brilinta .  Carotid duplex showed nonobstructive disease. 2020: Admitted 2/2-2/4 with pulmonary edema, NSTEMI with DES LAD, EF 45-50%, on ASA and Brilinta .  C/o claudication, ABI 0.69 on right 0.77 on left, patient opted to continue medical therapy at this time. 2022: Abdominal aortogram w/ LE runoff demonstrated flush left SFA occlusion reconstituted above knee popliteal artery, we will schedule him for a left fem-pop bypass 2024: Admitted 12/3 - 12/5 for diastolic CHF and hyperglycemia, developed pulmonary edema and treated with BiPAP, IV Lasix .  LVEF 40-45%, mildly decreased LV function, see echo result for RWMA findings, G1 DD, normal RV, LA  mildly-moderately dilated, trivial mitral regurg, aortic sclerosis present, no stenosis. 2025: Has had at least yearly follow-up to cardiology office since 2015, has been stable from a cardiovascular standpoint over the last couple visits.  There was discussion about being placed on Entresto for HFrEF, patient declined due to cost, decided to manage with diet.  Reported foot pain to vascular earlier this year, fem-pop graft patent with retrograde flow, no flow in distal peroneal artery, biphasic flow observed in distal anterior and posterior tibial arteries.  02/11/2024 ED visit: C/o CP, SOB, cough X 3 days.  Chest x-ray: Pleural effusion versus atelectasis.  CT PE confirm small pleural effusion.  Symptoms improved with DuoNebs.  Suspected COPD exacerbation.  Discharged on prednisone  and doxycycline .  Instructed to follow-up with PCP on CT findings.  02/14/2024: Presents the office today with his wife, feeling better than he felt on his visit to the emergency department. States he went to the ED for a cough, SOB, and chest tightness. Says he was told he had some fluid in his lung and wasn't sure why they told him to come see his heart doctor. States he did have a fever the first two days he was sick, but hasn't had one since, thought he was getting pneumonia like last year where he was admitted. Denies current fever, CP, SOB, changes to baseline DOE, orthopnea, palpitations, lightheadedness, dizziness, near syncope, nausea, vomiting, dark/tarry/bloody stools, changes in baseline leg swelling. Denies any use of sublingual nitroglycerin . States he has two more doses of prednisone  and is continuing his doxycycline . He reports he does feel as though there is some fluid in the bottom of his chest he can't quite cough up. No  incentive spirometer given in ED, went over deep breathing exercises patient can perform on his own to help create a more productive cough. States his left leg is chronically more swollen than his  right, but both he and his wife believe that the swelling is better today than it normally is. Discussed his lab values from the ED, discussed his low potassium and the change from chlorthalidone  to spironolactone , which may help with his history of HFrEF. Reports some urinary hesitancy, has not been evaluated for BPH. Discussed the importance of following up with his PCP in regards to that issue, checking to make sure his cough is improving, and evaluating for any new or worsening swelling in his LE because we will likely have to increase the spironolactone  dosage.    Past Medical History:  Diagnosis Date   CAP (community acquired pneumonia) 10/11/2017   Coronary artery disease    a. Cath: 2000 nonobs dz. b. 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 s/p BMS to LAD & RCA ; s/p PCI/DES dRCA & PDA x2 2011. d. cath 02/17/14: LM patent, LAD calcified, diffuse irregs, no high grade stenosis mLAD stent patent, LCx, diffuse irregs no high-grade dz, RCA total occ w/ L to R collats, medical Rx rec   Diabetes mellitus    Diastolic dysfunction    a. echo 02/18/14: EF 45-50%, mild LVH, AK of inferior and inferoseptal myocardium, GR1DD   Hiatal hernia    Hyperlipidemia    Hypertension    LV dysfunction    NSTEMI (non-ST elevated myocardial infarction) (HCC) 10/07/2017   PVD (peripheral vascular disease) (HCC)    Renal artery stenosis (HCC)    Mild, bilateral   S/P angioplasty with stent, atherectomy DES to pLAD and DES to mLAD 10/10/17  10/11/2017   Sleep apnea    Mild    Past Surgical History:  Procedure Laterality Date   ABDOMINAL AORTOGRAM W/LOWER EXTREMITY N/A 07/21/2020   Procedure: ABDOMINAL AORTOGRAM W/LOWER EXTREMITY;  Surgeon: Gretta Lonni PARAS, MD;  Location: [facility] INVASIVE CV LAB;  Service: Cardiovascular;  Laterality: N/A;   ABDOMINAL AORTOGRAM W/LOWER EXTREMITY Left 03/14/2023   Procedure: ABDOMINAL AORTOGRAM W/LOWER EXTREMITY;  Surgeon: Lanis Fonda BRAVO, MD;   Location: [facility] INVASIVE CV LAB;  Service: Cardiovascular;  Laterality: Left;   CATARACT EXTRACTION     CORONARY ATHERECTOMY N/A 10/10/2017   Procedure: CORONARY ATHERECTOMY - CSI;  Surgeon: Mady Lonni, MD;  Location: [facility] INVASIVE CV LAB;  Service: Cardiovascular;  Laterality: N/A;   CORONARY STENT INTERVENTION N/A 10/10/2017   Procedure: CORONARY STENT INTERVENTION;  Surgeon: Mady Lonni, MD;  Location: [facility] INVASIVE CV LAB;  Service: Cardiovascular;  Laterality: N/A;   CORONARY STENT INTERVENTION N/A 07/21/2018   Procedure: CORONARY STENT INTERVENTION;  Surgeon: Mady Lonni, MD;  Location: [facility] IN[facility]ASIVE CV [facility];  Service: Cardiovascular;  Laterality: N/A;   CORONARY ULTRASOUND/IVUS N/A 10/09/2017   Procedure: Intravascular Ultrasound/IVUS;  Surgeon: Anner Alm ORN, MD;  Location: [facility] INVASIVE CV LAB;  Service: Cardiovascular;  Laterality: N/A;   CORONARY ULTRASOUND/IVUS N/A 07/21/2018   Procedure: Intravascular Ultrasound/IVUS;  Surgeon: Mady Lonni, MD;  Location: [facility] INVASIVE CV LAB;  Service: Cardiovascular;  Laterality: N/A;   FEMORAL-POPLITEAL BYPASS GRAFT Left 07/27/2020   Procedure: LEFT FEMORAL TO BELOW KNEE POPLITEAL ARTERY BYPASS;  Surgeon: Gretta Lonni PARAS, MD;  Location: [facility] OR;  Service: Vascular;  Laterality: Left;   HIATAL HERNIA REPAIR     LEFT HEART CATH AND CORONARY ANGIOGRAPHY N/A 10/09/2017  Procedure: LEFT HEART CATH AND CORONARY ANGIOGRAPHY;  Surgeon: Anner Alm ORN, MD;  Location: [facility] INVASIVE CV LAB;  Service: Cardiovascular;  Laterality: N/A;   LEFT HEART CATH AND CORONARY ANGIOGRAPHY N/A 07/21/2018   Procedure: LEFT HEART CATH AND CORONARY ANGIOGRAPHY;  Surgeon: Mady Bruckner, MD;  Location: [facility] INVASIVE CV LAB;  Service: Cardiovascular;  Laterality: N/A;   LEFT HEART CATHETERIZATION WITH CORONARY ANGIOGRAM N/A 02/17/2014   Procedure: LEFT HEART CATHETERIZATION WITH CORONARY ANGIOGRAM;  Surgeon: Ozell JONETTA Fell, MD;  Location: [facility] CATH LAB;  Service:  Cardiovascular;  Laterality: N/A;   SHOULDER ARTHROSCOPY     Right   SOFT TISSUE TUMOR RESECTION     Vocal cord tumor, abdominal   SOFT TISSUE TUMOR RESECTION     Benign neck tumor   VEIN HARVEST Left 07/27/2020   Procedure: VEIN HARVEST USING  GREATER SAPHENOUS VEIN;  Surgeon: Gretta Bruckner PARAS, MD;  Location: [facility] OR;  Service: Vascular;  Laterality: Left;   Vocal cord polyp removal      Current Medications: Current Meds  Medication Sig   allopurinol  (ZYLOPRIM ) 100 MG tablet Take 100 mg by mouth every morning.   amLODipine  (NORVASC ) 10 MG tablet Take 1 tablet (10 mg total) by mouth every morning.   aspirin  81 MG tablet Take 81 mg by mouth every morning.   carvedilol  (COREG ) 25 MG tablet TAKE 1 TABLET BY MOUTH TWO TIMES DAILY WITH A MEAL (Patient taking differently: Take 25 mg by mouth in the morning and at bedtime.)   doxycycline  (VIBRAMYCIN ) 100 MG capsule Take 1 capsule (100 mg total) by mouth 2 (two) times daily.   empagliflozin  (JARDIANCE ) 25 MG TABS tablet Take 25 mg by mouth daily.   fenofibrate  (TRICOR ) 145 MG tablet Take 1 tablet (145 mg total) by mouth daily.   isosorbide  mononitrate (IMDUR ) 120 MG 24 hr tablet Take 1 tablet (120 mg total) by mouth at bedtime.   losartan  (COZAAR ) 100 MG tablet Take 100 mg by mouth every morning.    metFORMIN  (GLUCOPHAGE -XR) 500 MG 24 hr tablet Take 500 mg by mouth 2 (two) times daily.   nitroGLYCERIN  (NITROSTAT ) 0.4 MG SL tablet Place 1 tablet (0.4 mg total) under the tongue every 5 (five) minutes as needed for chest pain.   omeprazole (PRILOSEC) 20 MG capsule Take 20 mg by mouth every morning.    predniSONE  (DELTASONE ) 50 MG tablet Take 1 tablet (50 mg total) by mouth daily for 5 days.   rosuvastatin  (CRESTOR ) 40 MG tablet Take 1 tablet (40 mg total) by mouth daily.   spironolactone  (ALDACTONE ) 25 MG tablet Take 0.5 tablets (12.5 mg total) by mouth daily.   TOUJEO  MAX SOLOSTAR 300 UNIT/ML Solostar Pen Inject 100 Units into the skin daily.    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   Social Drivers of Health   Financial Resource Strain: Medium Risk (07/06/2019)   Received from [facility]   Overall Financial Resource Strain (CARDIA)    Difficulty of Paying Living Expenses: Somewhat hard  Food Insecurity: No Food Insecurity (05/22/2023)   Hunger Vital Sign    Worried About Running Out of Food in the Last Year: Never true    Ran Out of Food in the Last Year: Never true  Transportation Needs: No Transportation Needs (05/22/2023)   PRAPARE - Administrator, Civil Service (Medical): No    Lack of Transportation (Non-Medical): No  Physical Activity: Insufficiently Active (07/06/2019)   [facility]   Exercise Vital Sign    On average, how many days per week do you engage in moderate to strenuous exercise (like a brisk walk)?: 3 days    On average, how many minutes do you engage in exercise at this level?: 30 min  Stress: Stress Concern Present (07/06/2019)   Received from [facility]  Occupational Health - Occupational Stress Questionnaire    Feeling of Stress : Very much  Social Connections: Unknown (10/30/2021)   Received from [facility]   Social Network    Social Network: Not on file     Family History: The patient's family history includes Cancer in his brother and mother; Heart attack (age of onset: 78) in his father.  ROS:   Please see the history of present illness.    All other systems reviewed and are negative.  EKGs/Labs/Other Studies Reviewed:    The following studies were reviewed today:  EKG Interpretation Date/Time:  Friday February 14 2024 14:18:25 EDT Ventricular Rate:  84 PR Interval:  150 QRS Duration:  100 QT Interval:  388 QTC Calculation: 458 R Axis:   52  Text Interpretation: Normal sinus rhythm Nonspecific T wave abnormality When compared with ECG of 11-Feb-2024 09:08, PREVIOUS ECG IS PRESENT Confirmed by Elaine Moloney [phone]) on 02/14/2024 2:25:39 PM    Recent Labs: 05/23/2023: B Natriuretic Peptide 357.0; Magnesium  1.6 02/11/2024: BUN 17; Creatinine, Ser 0.94; Hemoglobin 15.6; Platelets 192; Potassium 3.3; Sodium 139  Recent Lipid Panel    Component Value Date/Time   CHOL 70 07/28/2020 0123   TRIG 219 (H) 07/28/2020 0123   HDL 17 (L) 07/28/2020 0123   CHOLHDL 4.1 07/28/2020 0123   VLDL 44 (H) 07/28/2020 0123   LDLCALC 9 07/28/2020 0123   LDLDIRECT 72 10/11/2023 0947     Physical Exam:    VS:  BP 117/64   Pulse 90   Ht 5' 11 (1.803 m)   Wt 200 lb (90.7 kg)   SpO2 98%   BMI 27.89 kg/m     Wt Readings from Last 3 Encounters:  02/14/24 200 lb (90.7 kg)  02/11/24 200 lb (90.7 kg)  10/09/23 195 lb (88.5 kg)     GEN:  Well nourished, well developed in no acute distress HEENT: Normal NECK:  Right sided carotid bruit CARDIAC: S1-S2 normal, RRR, no murmurs, rubs, gallops RESPIRATORY: Faint wheezing across all lung fields towards the  end of inspiration and expiration, lung sounds clearly audible throughout all  posterior fields. No rales or rhonchi.  MUSCULOSKELETAL:  Left leg edema, +2 at baseline present. +1 right leg, also at baseline per patient. No deformity  SKIN: Warm and dry NEUROLOGIC:  Alert and oriented x 3 PSYCHIATRIC:  Normal affect   ASSESSMENT:    1. Coronary artery disease involving native coronary artery of native heart, unspecified whether angina present   2. HFrEF (heart failure with reduced ejection fraction) (HCC)   3. Essential hypertension   4. PVD   5. Dyslipidemia   6. Right carotid bruit    PLAN:    In order of problems listed above:  Upper respiratory infection  - Fever x 2 days, cough, SOB, chest tightness, mild wheeze  - Improving from ED discharge  - Continue doxycycline   - Continue prednisone  - Follow up with PCP next week to make sure you don't require more antibiotics, also have them look into your urinary hesitancy and evaluate for any new swelling in legs, as we may need to go up on spironolactone .   Right carotid bruit  - Carotid disease present on previous duplex in 2019, <50% bilaterally.  - Right sided carotid bruit present on exam  - Will order carotid duplex  Coronary artery disease - EKG: sinus rhythm, 84 bpm, non-specific T wave abnormality, improved from ED EKG, T wave abnormalities present on previous studies  - Denies CP, SOB, palpitations  - His anginal equivalence is bilateral arm pain, which he denies having  - Continue coreg  25 mg twice daily  - Continue ASA 81 mg daily  - Use nitroglycerin  0.4 mg PRN chest pain   HFrEF  - Denies SOB, changes to baseline DOE, orthopnea, changes to baseline LE edema  - No rales or rhonchi auscultated, 98% O2 on RA  - Appeared euvolemic on exam today. NYHA class: 2  - Continue Losartan  100 mg daily  - Continue Jardiance  25 mg daily  - Continue Coreg  25 mg twice daily  - Potassium 3.3 on 02/11/2024, will D/C chlorthalidone  25 mg - Start spironolactone  12.5 mg, will order BMP to be drawn next week  to re-evaluate potassium. If he develops worsening leg swelling, will need to increase spironolactone  to 25 mg.     Hypertension  - Reports his BPs are well controlled with current medications  - Denies headache, blurred vision, dizziness, light headedness, near syncope  - Continue imdur  120 mg/24 hr daily  - Continue losartan  100 mg daily  - Continue amlodipine  10 mg daily  PVD  - Managed by vascular  - Reports he does have trouble walking w/o shoes on d/t peripheral neuropathy.  - Reports relief with something called Jimmy's foot cream that he buys from a woman in [place]  Dyslipidemia  - Continue Crestor  40 mg daily  - Continue fenofibrate   Follow up with general cardiology in 3 months considering medication change and to follow up on duplex results.       Medication Adjustments/Labs and Tests Ordered: Current medicines are reviewed at length with the patient today.  Concerns regarding medicines are outlined above.  Orders Placed This Encounter  Procedures   EKG 12-Lead   VAS US  CAROTID   Meds ordered this encounter  Medications   spironolactone  (ALDACTONE ) 25 MG tablet    Sig: Take 0.5 tablets (12.5 mg total) by mouth daily.    Dispense:  30 tablet    Refill:  3    Patient Instructions  Medication Instructions:  STOP chlorthalidone  25mg .  START Spirolactone 12.5mg . Take one table daily.  FOLLOW UP with your primary care provider regarding the coughing and fluid retention.  *If you need a refill on your cardiac medications before your next appointment, please call your pharmacy*   Lab Work: No labs were ordered during today's visit.  If you have labs (blood work) drawn today and your tests are completely normal, you will receive your results only by: MyChart Message (if you have MyChart) OR A paper copy in the mail If you have any lab test that is abnormal or we need to change your treatment, we will call you to review the  results.   Testing/Procedures: Your physician has requested that you have a carotid duplex. This test is an ultrasound of the carotid arteries in your neck. It looks at blood flow through these arteries that supply the brain with blood. Allow one hour for this exam. There are no restrictions or special instructions.   Your next appointment:   2-3 month(s)   Provider:   Lynwood Schilling, MD, Josefa Beauvais or Miriam Shams   Follow-Up: At [facility], you and your health needs are our priority.  As part of our continuing mission to provide you with exceptional heart care, we have created designated Provider Care Teams.  These Care Teams include your primary Cardiologist (physician) and Advanced Practice Providers (APPs -  Physician Assistants and Nurse Practitioners) who all work together to provide you with the care you need, when you need it. We recommend signing up for the patient portal called MyChart.  Sign up information is provided on this After Visit Summary.  MyChart is used to connect with patients for Virtual Visits (Telemedicine).  Patients are able to view lab/test results, encounter notes, upcoming appointments, etc.  Non-urgent messages can be sent to your provider as well.   To learn more about what you can do with MyChart, go to [email or web address].      Signed, Miriam FORBES Shams, NP  02/14/2024 3:22 PM    [facility]

## 2024-02-14 ENCOUNTER — Ambulatory Visit: Attending: General Practice | Admitting: Emergency Medicine

## 2024-02-14 ENCOUNTER — Encounter: Payer: Self-pay | Admitting: General Practice

## 2024-02-14 VITALS — BP 117/64 | HR 90 | Ht 71.0 in | Wt 200.0 lb

## 2024-02-14 DIAGNOSIS — I5020 Unspecified systolic (congestive) heart failure: Secondary | ICD-10-CM | POA: Insufficient documentation

## 2024-02-14 DIAGNOSIS — I2510 Atherosclerotic heart disease of native coronary artery without angina pectoris: Secondary | ICD-10-CM | POA: Insufficient documentation

## 2024-02-14 DIAGNOSIS — I739 Peripheral vascular disease, unspecified: Secondary | ICD-10-CM | POA: Insufficient documentation

## 2024-02-14 DIAGNOSIS — E785 Hyperlipidemia, unspecified: Secondary | ICD-10-CM | POA: Diagnosis present

## 2024-02-14 DIAGNOSIS — J069 Acute upper respiratory infection, unspecified: Secondary | ICD-10-CM | POA: Insufficient documentation

## 2024-02-14 DIAGNOSIS — I10 Essential (primary) hypertension: Secondary | ICD-10-CM | POA: Insufficient documentation

## 2024-02-14 DIAGNOSIS — R0989 Other specified symptoms and signs involving the circulatory and respiratory systems: Secondary | ICD-10-CM | POA: Diagnosis present

## 2024-02-14 MED ORDER — SPIRONOLACTONE 25 MG PO TABS: 12.5000 mg | ORAL_TABLET | Freq: Every day | ORAL | 3 refills | Status: DC

## 2024-02-14 NOTE — Patient Instructions (Addendum)
 Medication Instructions:  STOP chlorthalidone  25mg .  START Spirolactone 12.5mg . Take one table daily.  FOLLOW UP with your primary care provider regarding the coughing and fluid retention.  *If you need a refill on your cardiac medications before your next appointment, please call your pharmacy*   Lab Work: Return in one week for lab draw...................... BMET If you have labs (blood work) drawn today and your tests are completely normal, you will receive your results only by: MyChart Message (if you have MyChart) OR A paper copy in the mail If you have any lab test that is abnormal or we need to change your treatment, we will call you to review the results.   Testing/Procedures: Your physician has requested that you have a carotid duplex. This test is an ultrasound of the carotid arteries in your neck. It looks at blood flow through these arteries that supply the brain with blood. Allow one hour for this exam. There are no restrictions or special instructions.   Your next appointment:   2-3 month(s)   Provider:   Lynwood Schilling, MD, Josefa Beauvais or Miriam Shams   Follow-Up: At [facility], you and your health needs are our priority.  As part of our continuing mission to provide you with exceptional heart care, we have created designated Provider Care Teams.  These Care Teams include your primary Cardiologist (physician) and Advanced Practice Providers (APPs -  Physician Assistants and Nurse Practitioners) who all work together to provide you with the care you need, when you need it. We recommend signing up for the patient portal called MyChart.  Sign up information is provided on this After Visit Summary.  MyChart is used to connect with patients for Virtual Visits (Telemedicine).  Patients are able to view lab/test results, encounter notes, upcoming appointments, etc.  Non-urgent messages can be sent to your provider as well.   To learn more about what you can  do with MyChart, go to [email or web address].

## 2024-02-24 LAB — BASIC METABOLIC PANEL WITH GFR
BUN/Creatinine Ratio: 25 — ABNORMAL HIGH (ref 10–24)
BUN: 25 mg/dL (ref 8–27)
CO2: 22 mmol/L (ref 20–29)
Calcium: 9.6 mg/dL (ref 8.6–10.2)
Chloride: 101 mmol/L (ref 96–106)
Creatinine, Ser: 1 mg/dL (ref 0.76–1.27)
Glucose: 167 mg/dL — ABNORMAL HIGH (ref 70–99)
Potassium: 4.8 mmol/L (ref 3.5–5.2)
Sodium: 138 mmol/L (ref 134–144)
eGFR: 78 mL/min/1.73 (ref >59)

## 2024-02-25 ENCOUNTER — Ambulatory Visit: Payer: Self-pay | Admitting: Emergency Medicine

## 2024-02-25 DIAGNOSIS — I5020 Unspecified systolic (congestive) heart failure: Secondary | ICD-10-CM

## 2024-02-26 ENCOUNTER — Other Ambulatory Visit: Payer: Self-pay

## 2024-02-26 DIAGNOSIS — I5020 Unspecified systolic (congestive) heart failure: Secondary | ICD-10-CM

## 2024-02-27 ENCOUNTER — Ambulatory Visit (HOSPITAL_COMMUNITY)
Admission: RE | Admit: 2024-02-27 | Discharge: 2024-02-27 | Disposition: A | Discharge status: Home / Self Care | Source: Ambulatory Visit | Attending: Emergency Medicine | Admitting: Emergency Medicine

## 2024-02-27 DIAGNOSIS — R0989 Other specified symptoms and signs involving the circulatory and respiratory systems: Secondary | ICD-10-CM | POA: Diagnosis present

## 2024-02-27 DIAGNOSIS — I2510 Atherosclerotic heart disease of native coronary artery without angina pectoris: Secondary | ICD-10-CM | POA: Diagnosis present

## 2024-02-28 ENCOUNTER — Ambulatory Visit: Payer: Self-pay | Admitting: Emergency Medicine

## 2024-02-28 LAB — BASIC METABOLIC PANEL WITH GFR
BUN/Creatinine Ratio: 24 (ref 10–24)
BUN: 22 mg/dL (ref 8–27)
CO2: 24 mmol/L (ref 20–29)
Calcium: 9.1 mg/dL (ref 8.6–10.2)
Chloride: 103 mmol/L (ref 96–106)
Creatinine, Ser: 0.91 mg/dL (ref 0.76–1.27)
Glucose: 72 mg/dL (ref 70–99)
Potassium: 4.1 mmol/L (ref 3.5–5.2)
Sodium: 141 mmol/L (ref 134–144)
eGFR: 87 mL/min/1.73 (ref >59)

## 2024-02-28 NOTE — Progress Notes (Signed)
 Spoke with patient, gave labs. Will follow up in Nov.

## 2024-02-28 NOTE — Progress Notes (Signed)
 Called pt left message .

## 2024-03-25 ENCOUNTER — Encounter (INDEPENDENT_AMBULATORY_CARE_PROVIDER_SITE_OTHER): Payer: Self-pay | Admitting: *Deleted

## 2024-04-09 ENCOUNTER — Ambulatory Visit (INDEPENDENT_AMBULATORY_CARE_PROVIDER_SITE_OTHER): Admitting: Gastroenterology

## 2024-04-09 ENCOUNTER — Telehealth (INDEPENDENT_AMBULATORY_CARE_PROVIDER_SITE_OTHER): Payer: Self-pay

## 2024-04-09 ENCOUNTER — Encounter (INDEPENDENT_AMBULATORY_CARE_PROVIDER_SITE_OTHER): Payer: Self-pay | Admitting: Gastroenterology

## 2024-04-09 ENCOUNTER — Other Ambulatory Visit (HOSPITAL_COMMUNITY): Payer: Self-pay | Admitting: Occupational Therapy

## 2024-04-09 VITALS — BP 116/60 | HR 89 | Temp 97.8°F | Ht 71.0 in | Wt 204.8 lb

## 2024-04-09 DIAGNOSIS — R059 Cough, unspecified: Secondary | ICD-10-CM

## 2024-04-09 DIAGNOSIS — T17308A Unspecified foreign body in larynx causing other injury, initial encounter: Secondary | ICD-10-CM | POA: Insufficient documentation

## 2024-04-09 DIAGNOSIS — R1311 Dysphagia, oral phase: Secondary | ICD-10-CM

## 2024-04-09 DIAGNOSIS — R09A2 Foreign body sensation, throat: Secondary | ICD-10-CM

## 2024-04-09 DIAGNOSIS — R1319 Other dysphagia: Secondary | ICD-10-CM

## 2024-04-09 DIAGNOSIS — R1310 Dysphagia, unspecified: Secondary | ICD-10-CM

## 2024-04-09 DIAGNOSIS — Z1211 Encounter for screening for malignant neoplasm of colon: Secondary | ICD-10-CM | POA: Insufficient documentation

## 2024-04-09 NOTE — Patient Instructions (Signed)
 Schedule EGD and colonoscopy -will obtain cardiology clearance to proceed with this Schedule modified barium swallow Cut food in small pieces and chew food thoroughly to avoid choking episodes.

## 2024-04-09 NOTE — Telephone Encounter (Signed)
    04/09/24  [name] [name] [birth year]  What type of surgery is being performed? EGD and colonoscopy  When is surgery scheduled? TBD  What type of clearance is required (medical or pharmacy to hold medication or both? medical  Name of physician performing surgery?  Dr. Eartha Rouse [facility] at [facility] Phone: [phone] Fax: [phone]  Anethesia type (none, local, MAC, general)? MAC

## 2024-04-09 NOTE — Progress Notes (Unsigned)
 Toribio Fortune, M.D. Gastroenterology & Hepatology [facility] Gastroenterology [address] [place], [place] [address] Primary Care Physician: Renato Dorothey HERO, NP [id number]  [address] [place] [address]  Referring MD: PCP  Chief Complaint: Dysphagia  History of Present Illness: [name] [name] is a 78 y.o. male with past medical history of coronary artery disease and NSTEMI status post stent placement, COPD, pneumonia, diabetes, heart failure with diastolic dysfunction, hyperlipidemia, hypertension, sleep apnea, peripheral vascular disease, who presents for evaluation of dysphagia.  Patient reports that for at least 10 years, he has had issues with feeling the food gets stuck in the throat. This is specially worse when eating things like popcorn, but any type of food may also cause it. He will have choking with this and will cough out the food. It happens intermittently  sometimes more than once a month but sometimes less often.  Sometime he feels pills get stuck in his chest. He may have some burning sensation when he has an episode, but this does not happen.  Reports that 30-40 years ago he had a laryngoscopy by Ent and had some polyps removed.  The patient denies having any nausea, vomiting, fever, chills, hematochezia, melena, hematemesis, abdominal distention, abdominal pain, diarrhea, jaundice, pruritus. Gained 8-10 lb in last couple months.  Patient follows in the cardiology clinic at [facility].  Last time seen in their office was on 02/14/2024.  Last ZHI:wzczm Last Colonoscopy:in the 1990s, no report available.  FHx: neg for any gastrointestinal/liver disease, mother and brother had cancer but unknown type Social: former smoking but quit 20 years ago, neg alcohol or illicit drug use Surgical: no abdominal surgeriesventral hernia  Past Medical History: Past Medical History:  Diagnosis Date   CAP (community acquired pneumonia) 10/11/2017    Coronary artery disease    a. Cath: 2000 nonobs dz. b. 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 s/p BMS to LAD & RCA ; s/p PCI/DES dRCA & PDA x2 2011. d. cath 02/17/14: LM patent, LAD calcified, diffuse irregs, no high grade stenosis mLAD stent patent, LCx, diffuse irregs no high-grade dz, RCA total occ w/ L to R collats, medical Rx rec   Diabetes mellitus    Diastolic dysfunction    a. echo 02/18/14: EF 45-50%, mild LVH, AK of inferior and inferoseptal myocardium, GR1DD   Hiatal hernia    Hyperlipidemia    Hypertension    LV dysfunction    NSTEMI (non-ST elevated myocardial infarction) (HCC) 10/07/2017   PVD (peripheral vascular disease)    Renal artery stenosis    Mild, bilateral   S/P angioplasty with stent, atherectomy DES to pLAD and DES to mLAD 10/10/17  10/11/2017   Sleep apnea    Mild    Past Surgical History: Past Surgical History:  Procedure Laterality Date   ABDOMINAL AORTOGRAM W/LOWER EXTREMITY N/A 07/21/2020   Procedure: ABDOMINAL AORTOGRAM W/LOWER EXTREMITY;  Surgeon: Gretta Lonni PARAS, MD;  [facility] INVASIVE CV LAB;  Service: Cardiovascular;  Laterality: N/A;   ABDOMINAL AORTOGRAM W/LOWER EXTREMITY Left 03/14/2023   Procedure: ABDOMINAL AORTOGRAM W/LOWER EXTREMITY;  Surgeon: Lanis Fonda BRAVO, MD;  Location: [facility] INVASIVE CV LAB;  Service: Cardiovascular;  Laterality: Left;   CATARACT EXTRACTION     CORONARY ATHERECTOMY N/A 10/10/2017   Procedure: CORONARY ATHERECTOMY - CSI;  Surgeon: Mady Lonni, MD;  [facility] INVAS[facility] CV LAB;  Service: Cardiovascular;  Laterality: N/A;   CORONARY STENT INTERVENTION N/A 10/10/2017  Procedure: CORONARY STENT INTERVENTION;  Surgeon: Mady Bruckner, MD;  [facility] INV[facility] CV LAB;  Service: Cardiovascular;  Laterality: N/A;   CORONARY STENT INTERVENTION N/A 07/21/2018   Procedure: CORONARY STENT INTERVENTION;  Surgeon: Mady Bruckner, MD;  [facility] INV[facility] CV LAB;  Service: Cardiovascular;   Laterality: N/A;   CORONARY ULTRASOUND/IVUS N/A 10/09/2017   Procedure: Intravascular Ultrasound/IVUS;  Surgeon: Anner Alm ORN, MD;  Location: [facility] INVASIVE CV LAB;  Service: Cardiovascular;  Laterality: N/A;   CORONARY ULTRASOUND/IVUS N/A 07/21/2018   Procedure: Intravascular Ultrasound/IVUS;  Surgeon: Mady Bruckner, MD;  [facility] LAB;  Service: Cardiovascular;  Laterality: N/A;   FEMORAL-POPLITEAL BYPASS GRAFT Left 07/27/2020   Procedure: LEFT FEMORAL TO BELOW KNEE POPLITEAL ARTERY BYPASS;  Surgeon: Gretta Bruckner PARAS, MD;  Location: [facility] OR;  Service: Vascular;  Laterality: Left;   HIATAL HERNIA REPAIR     LEFT HEART CATH AND CORONARY ANGIOGRAPHY N/A 10/09/2017   Procedure: LEFT HEART CATH AND CORONARY ANGIOGRAPHY;  Surgeon: Anner Alm ORN, MD;  Location: [facility] INVASIVE CV LAB;  Service: Cardiovascular;  Laterality: N/A;   LEFT HEART CATH AND CORONARY ANGIOGRAPHY N/A 07/21/2018   Procedure: LEFT HEART CATH AND CORONARY ANGIOGRAPHY;  Surgeon: Mady Bruckner, MD;  [facility];  Service: Cardiovascular;  Laterality: N/A;   LEFT HEART CATHETERIZATION WITH CORONARY ANGIOGRAM N/A 02/17/2014   Procedure: LEFT HEART CATHETERIZATION WITH CORONARY ANGIOGRAM;  Surgeon: Ozell JONETTA Fell, MD;  Location: [facility] CATH LAB;  Service: Cardiovascular;  Laterality: N/A;   SHOULDER ARTHROSCOPY     Right   SOFT TISSUE TUMOR RESECTION     Vocal cord tumor, abdominal   SOFT TISSUE TUMOR RESECTION     Benign neck tumor   VEIN HARVEST Left 07/27/2020   Procedure: VEIN HARVEST USING  GREATER SAPHENOUS VEIN;  Surgeon: Gretta Bruckner PARAS, MD;  [facility] OR;  Service: Vascular;  Laterality: Left;   Vocal cord polyp removal      Family History: Family History  Problem Relation Age of Onset   Heart attack Father 68   Cancer Mother    Cancer Brother     Social History: Social History   Tobacco Use  Smoking Status Former   Current packs/day: 0.00   Types: Cigarettes   Quit date:  06/19/2003   Years since quitting: 20.8   Passive exposure: Never  Smokeless Tobacco Never   Social History   Substance and Sexual Activity  Alcohol Use No   Social History   Substance and Sexual Activity  Drug Use No    Allergies: Allergies  Allergen Reactions   Bee Venom Swelling and Other (See Comments)   Other Other (See Comments)   Penicillin G Other (See Comments)   Penicillins Itching and Swelling    Itching, swelling of the face/tongue/throat, SOB, or low BP   Plavix  [Clopidogrel  Bisulfate] Other (See Comments)    Patient is noted to be a nonresponder to Plavix     Medications: Current Outpatient Medications  Medication Sig Dispense Refill   allopurinol  (ZYLOPRIM ) 100 MG tablet Take 100 mg by mouth every morning.     amLODipine  (NORVASC ) 10 MG tablet Take 1 tablet (10 mg total) by mouth every morning.     aspirin  81 MG tablet Take 81 mg by mouth every morning.     carvedilol  (COREG ) 25 MG tablet TAKE 1 TABLET BY MOUTH TWO TIMES DAILY WITH A MEAL (Patient taking differently: Take 25 mg by mouth in the morning and at bedtime.) 180  tablet 3   empagliflozin  (JARDIANCE ) 25 MG TABS tablet Take 25 mg by mouth daily.     fenofibrate  (TRICOR ) 145 MG tablet Take 1 tablet (145 mg total) by mouth daily. 90 tablet 3   gabapentin  (NEURONTIN ) 100 MG capsule Take 200 mg by mouth 3 (three) times daily.     isosorbide  mononitrate (IMDUR ) 120 MG 24 hr tablet Take 1 tablet (120 mg total) by mouth at bedtime.     losartan  (COZAAR ) 100 MG tablet Take 100 mg by mouth every morning.      metFORMIN  (GLUCOPHAGE -XR) 500 MG 24 hr tablet Take 500 mg by mouth 2 (two) times daily.     nitroGLYCERIN  (NITROSTAT ) 0.4 MG SL tablet Place 1 tablet (0.4 mg total) under the tongue every 5 (five) minutes as needed for chest pain. 25 tablet 3   omeprazole (PRILOSEC) 20 MG capsule Take 20 mg by mouth every morning.      rosuvastatin  (CRESTOR ) 40 MG tablet Take 1 tablet (40 mg total) by mouth daily. 90 tablet 3    spironolactone  (ALDACTONE ) 25 MG tablet Take 0.5 tablets (12.5 mg total) by mouth daily. (Patient taking differently: Take 25 mg by mouth daily.) 30 tablet 3   TOUJEO  MAX SOLOSTAR 300 UNIT/ML Solostar Pen Inject 100 Units into the skin daily.     No current facility-administered medications for this visit.    Review of Systems: GENERAL: negative for malaise, night sweats HEENT: No changes in hearing or vision, no nose bleeds or other nasal problems. NECK: Negative for lumps, goiter, pain and significant neck swelling RESPIRATORY: Negative for cough, wheezing CARDIOVASCULAR: Negative for chest pain, leg swelling, palpitations, orthopnea GI: SEE HPI MUSCULOSKELETAL: Negative for joint pain or swelling, back pain, and muscle pain. SKIN: Negative for lesions, rash PSYCH: Negative for sleep disturbance, mood disorder and recent psychosocial stressors. HEMATOLOGY Negative for prolonged bleeding, bruising easily, and swollen nodes. ENDOCRINE: Negative for cold or heat intolerance, polyuria, polydipsia and goiter. NEURO: negative for tremor, gait imbalance, syncope and seizures. The remainder of the review of systems is noncontributory.   Physical Exam: BP 116/60 (BP Location: Left Arm, Patient Position: Sitting, Cuff Size: Normal)   Pulse 89   Temp 97.8 F (36.6 C) (Temporal)   Ht 5' 11 (1.803 m)   Wt 204 lb 12.8 oz (92.9 kg)   BMI 28.56 kg/m  GENERAL: The patient is AO x3, in no acute distress. HEENT: Head is normocephalic and atraumatic. EOMI are intact. Mouth is well hydrated and without lesions. NECK: Supple. No masses LUNGS: Clear to auscultation. No presence of rhonchi/wheezing/rales. Adequate chest expansion HEART: RRR, normal s1 and s2. ABDOMEN: Soft, nontender, no guarding, no peritoneal signs, and nondistended. BS +. No masses. RECTAL EXAM: no external lesions, normal tone, no masses, brown stool without blood.*** Chaperone: EXTREMITIES: Without any cyanosis, clubbing,  rash, lesions or edema. NEUROLOGIC: AOx3, no focal motor deficit. SKIN: no jaundice, no rashes   Imaging/Labs: as above  I personally reviewed and interpreted the available labs, imaging and endoscopic files.  Impression and Plan: Amar Sippel is a 78 y.o. male with ***   All questions were answered.      Toribio Fortune, MD Gastroenterology and Hepatology [facility] Gastroenterology

## 2024-04-10 NOTE — Telephone Encounter (Signed)
   Name: [name] [name]  DOB: [birth year]  MRN: [id number]  Primary Cardiologist: Lynwood Schilling, MD   Preoperative team, please contact this patient and set up a phone call appointment for further preoperative risk assessment. Please obtain consent and complete medication review. Thank you for your help.  I confirm that guidance regarding antiplatelet and oral anticoagulation therapy has been completed and, if necessary, noted below.  Per office protocol, if patient is without any new symptoms or concerns at the time of their virtual visit, he may hold ASA for 7 days prior to procedure. Please resume ASA as soon as possible postprocedure, at the discretion of the surgeon.    I also confirmed the patient resides in the state of [place] . As per [facility] Medical Board telemedicine laws, the patient must reside in the state in which the provider is licensed.   Lamarr Satterfield, NP 04/10/2024, 7:50 AM [facility]

## 2024-04-10 NOTE — Telephone Encounter (Signed)
 I d/w with the preop APP in lieu of schedules running full due to the end of year, ok to wait until MD appt 05/06/24 for preop as long as GI office agrees.    I s/w Autumn with the GI office. I stated the pt has appt with his cardiologist Dr. Edison 05/06/24. Our preop team inquired if ok pt waits to see his cardiologist for preop clearance or is procedure needing to be done sooner.    Per Autumn after reading notes states the doctor did not indicate that this was urgent and ok to wait until pt see's his cardiologist for preop clearance.    I will update all parties involved .

## 2024-04-10 NOTE — Telephone Encounter (Signed)
 Spoke with cardiology office, they stated that the patient has an appointment on 05/06/2024 and they will be able to respond to the clearance request after this appointment.

## 2024-04-10 NOTE — Telephone Encounter (Signed)
 Agree, ok to wait until 11/19 Thanks

## 2024-04-17 ENCOUNTER — Encounter (HOSPITAL_COMMUNITY): Payer: Self-pay | Admitting: Speech Pathology

## 2024-04-17 ENCOUNTER — Ambulatory Visit (HOSPITAL_COMMUNITY): Attending: Gastroenterology | Admitting: Speech Pathology

## 2024-04-17 ENCOUNTER — Ambulatory Visit (HOSPITAL_COMMUNITY)
Admission: RE | Admit: 2024-04-17 | Discharge: 2024-04-17 | Disposition: A | Discharge status: Home / Self Care | Source: Ambulatory Visit | Attending: Gastroenterology | Admitting: Gastroenterology

## 2024-04-17 DIAGNOSIS — T17308A Unspecified foreign body in larynx causing other injury, initial encounter: Secondary | ICD-10-CM | POA: Diagnosis present

## 2024-04-17 DIAGNOSIS — R1310 Dysphagia, unspecified: Secondary | ICD-10-CM | POA: Diagnosis present

## 2024-04-17 DIAGNOSIS — R1311 Dysphagia, oral phase: Secondary | ICD-10-CM | POA: Diagnosis present

## 2024-04-17 DIAGNOSIS — R059 Cough, unspecified: Secondary | ICD-10-CM | POA: Insufficient documentation

## 2024-04-17 NOTE — Therapy (Addendum)
 [facility] [address] [place], [place], [address] Phone: [phone]   Fax:  [phone]  Modified Barium Swallow  Patient Details  Name: [name] [name] MRN: [id number] Date of Birth: Sep 22, 1945 No data recorded  Encounter Date: 04/17/2024   End of Session - 04/17/24 1132     Visit Number 1    Number of Visits 1    Activity Tolerance Patient tolerated treatment well           HPI/PMH: HPI: [name] [name] is a 78 y.o. male with past medical history of coronary artery disease and NSTEMI status post stent placement, COPD, pneumonia, diabetes, heart failure with diastolic dysfunction, hyperlipidemia, hypertension, sleep apnea, peripheral vascular disease, who presents for evaluation of dysphagia. Note Pt is largely edentulous (no upper teeth and six lower front teeth); Pt reports eating whatever he wants despite missing dentition. He reports occasionally getting choked and coughing up and expectorating chunks of food when this happens. This happens a couple of times a month. Patient reports occasional difficulty with pills - with further investigation SLP discovered Patient swallows 15 pills at once. MBS requested to objectively assess the swallowing function.   Clinical Impression: Pt's swallowing presented to be within functional limits on today's exam. No penetration or aspiration noted with any texture or consistency. Slightly diminished pharyngeal stripping wave resulting in trace pharyngeal residue cleared by reflexive and cued repeat swallow. Despite significant missing dentition (only six lower front teeth) Pt demonstrated adequate and effective mastication followed by a timely swallow with complete oral clearance. Note brief stasis of the barium tablet in the pyriforms that easily cleared with a repeat presentation of thin liquids. Recommend continue with regular thin diet. SLP provided education that Pt should  take smaller bites and ensure thorough mastication prior to swallowing d/t sparse dentition; further recommend possibly avoiding problematic textures - Pt reports difficulty with popcorn. Recommend Pt not swallow all 15 pills at once; either one at a time or in smaller groups to decrease difficulty with swallowing pills. SLP reviewed above findings and recommendations with Patient and patient's wife. No further ST indicated at this time. Thank you for this referral,  Factors that may increase risk of adverse event in presence of aspiration Noe & Lianne 2021): No data recorded  Recommendations/Plan: Swallowing Evaluation Recommendations Swallowing Evaluation Recommendations Recommendations: PO diet PO Diet Recommendation: Regular; Thin liquids (Level 0) Liquid Administration via: Cup; Straw Medication Administration: Whole meds with liquid Supervision: Patient able to self-feed Swallowing strategies  : Slow rate; Small bites/sips Postural changes: Position pt fully upright for meals Oral care recommendations: Oral care BID (2x/day)    Treatment Plan Treatment Plan Treatment recommendations: No treatment recommended at this time Follow-up recommendations: No SLP follow up     Recommendations Recommendations for follow up therapy are one component of a multi-disciplinary discharge planning process, led by the attending physician.  Recommendations may be updated based on patient status, additional functional criteria and insurance authorization.  Assessment: Orofacial Exam: Orofacial Exam Oral Cavity: Oral Hygiene: WFL Oral Cavity - Dentition: Missing dentition (no upper teeth 6 lower front teeth) Orofacial Anatomy: WFL Oral Motor/Sensory Function: WFL    Anatomy:  Anatomy: WFL   Boluses Administered: Boluses Administered Boluses Administered: Thin liquids (Level 0); Mildly thick liquids (Level 2, nectar thick); Moderately thick liquids (Level 3, honey thick); Puree;  Solid     Oral Impairment Domain: Oral Impairment Domain Lip Closure: No labial escape Tongue control during bolus  hold: Cohesive bolus between tongue to palatal seal Bolus preparation/mastication: Timely and efficient chewing and mashing Bolus transport/lingual motion: Brisk tongue motion Oral residue: Complete oral clearance Location of oral residue : Floor of mouth Initiation of pharyngeal swallow : Posterior angle of the ramus     Pharyngeal Impairment Domain: Pharyngeal Impairment Domain Soft palate elevation: No bolus between soft palate (SP)/pharyngeal wall (PW) Laryngeal elevation: Partial superior movement of thyroid  cartilage/partial approximation of arytenoids to epiglottic petiole Anterior hyoid excursion: Partial anterior movement Epiglottic movement: Partial inversion Laryngeal vestibule closure: Complete, no air/contrast in laryngeal vestibule Pharyngeal stripping wave : Present - diminished Pharyngeal contraction (A/P view only): N/A Pharyngoesophageal segment opening: Partial distention/partial duration, partial obstruction of flow Tongue base retraction: Trace column of contrast or air between tongue base and PPW Pharyngeal residue: Trace residue within or on pharyngeal structures Location of pharyngeal residue: Valleculae; Pharyngeal wall     Esophageal Impairment Domain: Esophageal Impairment Domain Esophageal clearance upright position: Complete clearance, esophageal coating    Pill: Pill Consistency administered: Thin liquids (Level 0) Thin liquids (Level 0): [facility]    Penetration/Aspiration Scale Score: Penetration/Aspiration Scale Score 1.  Material does not enter airway: Thin liquids (Level 0); Mildly thick liquids (Level 2, nectar thick); Puree; Moderately thick liquids (Level 3, honey thick); Solid; Pill    Compensatory Strategies: Compensatory Strategies Compensatory strategies: No       General Information: Caregiver present: Yes    Diet Prior to this Study: Regular; Thin liquids (Level 0)    Temperature : Normal    Respiratory Status: WFL    Supplemental O2: None (Room air)    History of Recent Intubation: No   Behavior/Cognition: Alert; Cooperative; Pleasant mood  Self-Feeding Abilities: Able to self-feed  Baseline vocal quality/speech: Normal  Volitional Cough: Able to elicit  Volitional Swallow: Able to elicit  Exam Limitations: No limitations   Goal Planning: No data recorded No data recorded No data recorded No data recorded No data recorded  Pain: No data recorded  End of Session: Start Time:No data recorded Stop Time: No data recorded Time Calculation:No data recorded Charges: No data recorded SLP visit diagnosis: SLP Visit Diagnosis: Dysphagia, unspecified (R13.10)    Past Medical History:  Past Medical History:  Diagnosis Date   CAP (community acquired pneumonia) 10/11/2017   Coronary artery disease    a. Cath: 2000 nonobs dz. b. 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 s/p BMS to LAD & RCA ; s/p PCI/DES dRCA & PDA x2 2011. d. cath 02/17/14: LM patent, LAD calcified, diffuse irregs, no high grade stenosis mLAD stent patent, LCx, diffuse irregs no high-grade dz, RCA total occ w/ L to R collats, medical Rx rec   Diabetes mellitus    Diastolic dysfunction    a. echo 02/18/14: EF 45-50%, mild LVH, AK of inferior and inferoseptal myocardium, GR1DD   Hiatal hernia    Hyperlipidemia    Hypertension    LV dysfunction    NSTEMI (non-ST elevated myocardial infarction) (HCC) 10/07/2017   PVD (peripheral vascular disease)    Renal artery stenosis    Mild, bilateral   S/P angioplasty with stent, atherectomy DES to pLAD and DES to mLAD 10/10/17  10/11/2017   Sleep apnea    Mild   Past Surgical History:  Past Surgical History:  Procedure Laterality Date   ABDOMINAL AORTOGRAM W/LOWER EXTREMITY N/A 07/21/2020   Procedure: ABDOMINAL AORTOGRAM W/LOWER EXTREMITY;   Surgeon: Gretta Lonni PARAS, MD;  [facility] INVASIVE CV LAB;  Service: Cardiovascular;  Laterality: N/A;   ABDOMINAL AORTOGRAM W/LOWER EXTREMITY Left 03/14/2023   Procedure: ABDOMINAL AORTOGRAM W/LOWER EXTREMITY;  Surgeon: Lanis Fonda BRAVO, MD;  Location: [facility] INVASIVE CV LAB;  Service: Cardiovascular;  Laterality: Left;   CATARACT EXTRACTION     CORONARY ATHERECTOMY N/A 10/10/2017   Procedure: CORONARY ATHERECTOMY - CSI;  Surgeon: Mady Bruckner, MD;  [facility]: [facility] INVASIVE CV LAB;  Service: Cardiovascular;  Laterality: N/A;   CORONARY STENT INTERVENTION N/A 10/10/2017   Procedure: CORONARY STENT INTERVENTION;  Surgeon: Mady Bruckner, MD;  [facility] INVASIVE CV LAB;  Service: Cardiovascular;  Laterality: N/A;   CORONARY STENT INTERVENTION N/A 07/21/2018   Procedure: CORONARY STENT INTERVENTION;  Surgeon: Mady Bruckner, MD;  [facility] INVASIVE CV LAB;  Service: Cardiovascular;  Laterality: N/A;   CORONARY ULTRASOUND/IVUS N/A 10/09/2017   Procedure: Intravascular Ultrasound/IVUS;  Surgeon: Anner Alm ORN, MD;  Location: [facility] INVASIVE CV LAB;  Service: Cardiovascular;  Laterality: N/A;   CORONARY ULTRASOUND/IVUS N/A 07/21/2018   Procedure: Intravascular Ultrasound/IVUS;  Surgeon: Mady Bruckner, MD;  [facility] INVASIVE CV LAB;  Service: Cardiovascular;  Laterality: N/A;   FEMORAL-POPLITEAL BYPASS GRAFT Left 07/27/2020   Procedure: LEFT FEMORAL TO BELOW KNEE POPLITEAL ARTERY BYPASS;  Surgeon: Gretta Bruckner PARAS, MD;  Location: [facility] OR;  Service: Vascular;  Laterality: Left;   HIATAL HERNIA REPAIR     LEFT HEART CATH AND CORONARY ANGIOGRAPHY N/A 10/09/2017   Procedure: LEFT HEART CATH AND CORONARY ANGIOGRAPHY;  Surgeon: Anner Alm ORN, MD;  Location: [facility] INVASIVE CV LAB;  Service: Cardiovascular;  Laterality: N/A;   LEFT HEART CATH AND CORONARY ANGIOGRAPHY N/A 07/21/2018   Procedure: LEFT HEART CATH AND CORONARY ANGIOGRAPHY;  Surgeon: Mady Bruckner, MD;  [facility] INVASIVE CV LAB;   Service: Cardiovascular;  Laterality: N/A;   LEFT HEART CATHETERIZATION WITH CORONARY ANGIOGRAM N/A 02/17/2014   Procedure: LEFT HEART CATHETERIZATION WITH CORONARY ANGIOGRAM;  Surgeon: Ozell JONETTA Fell, MD;  Location: [facility] CATH LAB;  Service: Cardiovascular;  Laterality: N/A;   SHOULDER ARTHROSCOPY     Right   SOFT TISSUE TUMOR RESECTION     Vocal cord tumor, abdominal   SOFT TISSUE TUMOR RESECTION     Benign neck tumor   VEIN HARVEST Left 07/27/2020   Procedure: VEIN HARVEST USING  GREATER SAPHENOUS VEIN;  Surgeon: Gretta Bruckner PARAS, MD;  Location: [facility] OR;  Service: Vascular;  Laterality: Left;   Vocal cord polyp removal     Merrit Friesen H. Clois KILLIAN, CCC-SLP Speech Language Pathologist  Raguel VEAR Clois 04/17/2024, 11:33 AM

## 2024-04-23 ENCOUNTER — Encounter (INDEPENDENT_AMBULATORY_CARE_PROVIDER_SITE_OTHER): Payer: Self-pay | Admitting: Gastroenterology

## 2024-05-05 DIAGNOSIS — I6529 Occlusion and stenosis of unspecified carotid artery: Secondary | ICD-10-CM | POA: Insufficient documentation

## 2024-05-05 NOTE — Progress Notes (Unsigned)
 Cardiology Office Note:   Date:  05/06/2024  ID:  [name] [name], DOB [birth year], MRN [id number] PCP: Renato Dorothey HERO, NP  [facility] Providers Cardiologist:  Lynwood Schilling, MD {  History of Present Illness:   [name] [name] is a 78 y.o. male  who presents for for follow up of his CAD.  He was admitted Sept 2015 with non-ST elevation MI. He was found to have an occluded right coronary artery but patent LAD stents. He was managed medically.   He was in the hospital in April 2019.  He had pneumonia.  He had C. difficile.  He also had non-Q wave MI with disease as listed below and ended up with staged PCI.  Ejection fraction was 45% which is essentially unchanged from previous.  He saw Dr. Darron with leg pain and PVD.    Feb 2020 he did have increasing shortness of breath and some chest discomfort.  He was diagnosed with NSTEMI.   He underwent PCI by Dr. Mady at that time. He has had lower extremity bypass.     Since I last saw him he has mostly been limited by leg pain.  This is left leg pain.  He did have an arteriogram last September by Dr. Lanis.  He is still limited by leg pain that goes from his hip down the back of his left leg.  He has to stop and he says he hobbles when he walks.  The patient denies any new symptoms such as chest discomfort, neck or arm discomfort. There has been no new shortness of breath, PND or orthopnea. There have been no reported palpitations, presyncope or syncope.  He has no new cardiac issues.  He has had no new cardiac issues.      ROS: As stated in the HPI and negative for all other systems.  Studies Reviewed:    EKG:     Sinus rhythm, rate 84, axis within normal limits, intervals within normal limits, inferolateral T wave inversions unchanged from previous EKGs.  02/14/2024   Risk Assessment/Calculations:     Physical Exam:   VS:  BP 118/64   Pulse 96   Ht 5' 10 (1.778 m)   Wt 200 lb (90.7 kg)   BMI 28.70 kg/m    Wt  Readings from Last 3 Encounters:  05/06/24 200 lb (90.7 kg)  04/09/24 204 lb 12.8 oz (92.9 kg)  02/14/24 200 lb (90.7 kg)     GEN: Well nourished, well developed in no acute distress NECK: No JVD; bilateral soft carotid bruits CARDIAC: RRR, no murmurs, rubs, gallops RESPIRATORY:  Clear to auscultation without rales, wheezing or rhonchi  ABDOMEN: Soft, non-tender, non-distended EXTREMITIES:  Left leg non pitting edema; No deformity .  Decreased dorsalis pedis and posttibial's bilateral  ASSESSMENT AND PLAN:   CAD:   The patient has no new sypmtoms.  No further cardiovascular testing is indicated.  We will continue with aggressive risk reduction and meds as listed.   HTN:   The blood pressure is at target.  He actually was prescribed spironolactone  because he had low potassium and was not supposed to be taking his HCTZ.  However, he never complied with that and stayed on the HCTZ then never got the spironolactone  filled.  His last potassium was normal.  I think he can remain on the meds as listed and just have his labs followed.   DM:   A1c was most recently 9.5 that I can see.  He is having this followed by Dorothey Cassette.  I have asked him to check with him to see the most recent level.   PVD:   Leg pain is his biggest complaint.  He has follow-up in January at [facility].    DYSLIPIDEMIA:   Direct LDL was 72 in September.  No change in therapy.   CAROTID STENOSIS:   This was mild in 2019.  I will follow-up carotid Dopplers.   Follow up with me in 1 year  Signed, Lynwood Schilling, MD

## 2024-05-06 ENCOUNTER — Other Ambulatory Visit: Payer: Self-pay | Admitting: Vascular Surgery

## 2024-05-06 ENCOUNTER — Encounter: Payer: Self-pay | Admitting: Cardiology

## 2024-05-06 ENCOUNTER — Ambulatory Visit (INDEPENDENT_AMBULATORY_CARE_PROVIDER_SITE_OTHER): Admitting: Cardiology

## 2024-05-06 VITALS — BP 118/64 | HR 96 | Ht 70.0 in | Wt 200.0 lb

## 2024-05-06 DIAGNOSIS — I10 Essential (primary) hypertension: Secondary | ICD-10-CM

## 2024-05-06 DIAGNOSIS — E785 Hyperlipidemia, unspecified: Secondary | ICD-10-CM

## 2024-05-06 DIAGNOSIS — I739 Peripheral vascular disease, unspecified: Secondary | ICD-10-CM | POA: Diagnosis not present

## 2024-05-06 DIAGNOSIS — E118 Type 2 diabetes mellitus with unspecified complications: Secondary | ICD-10-CM | POA: Diagnosis not present

## 2024-05-06 DIAGNOSIS — I70498 Other atherosclerosis of autologous vein bypass graft(s) of the extremities, other extremity: Secondary | ICD-10-CM

## 2024-05-06 DIAGNOSIS — I6529 Occlusion and stenosis of unspecified carotid artery: Secondary | ICD-10-CM

## 2024-05-06 DIAGNOSIS — I2510 Atherosclerotic heart disease of native coronary artery without angina pectoris: Secondary | ICD-10-CM

## 2024-05-06 DIAGNOSIS — T82858A Stenosis of vascular prosthetic devices, implants and grafts, initial encounter: Secondary | ICD-10-CM

## 2024-05-06 MED ORDER — HYDROCHLOROTHIAZIDE 25 MG PO TABS: 25.0000 mg | ORAL_TABLET | Freq: Every day | ORAL | 3 refills | Status: AC

## 2024-05-06 MED ORDER — ISOSORBIDE MONONITRATE ER 120 MG PO TB24: 120.0000 mg | ORAL_TABLET | Freq: Every day | ORAL | 3 refills | Status: AC

## 2024-05-06 NOTE — Patient Instructions (Addendum)
 Medication Instructions:  Your physician has recommended you make the following change in your medication:  Start hydrochlorothiazide 25 mg daily Continue all other medications as prescribed  Labwork: none  Testing/Procedures: Your physician has requested that you have a carotid duplex. This test is an ultrasound of the carotid arteries in your neck. It looks at blood flow through these arteries that supply the brain with blood. Allow one hour for this exam. There are no restrictions or special instructions.  Follow-Up: Your physician recommends that you schedule a follow-up appointment in: 1 year. You will receive a reminder call in about 8-10 months reminding you to schedule your appointment. If you don't receive this call, please contact our office.   Any Other Special Instructions Will Be Listed Below (If Applicable).  If you need a refill on your cardiac medications before your next appointment, please call your pharmacy.

## 2024-05-07 NOTE — Telephone Encounter (Signed)
   [name]  DOB: 25-Jul-1945 MRN: [id number]  Primary Cardiologist: Lynwood Schilling, MD  Chart reviewed as part of pre-operative protocol coverage.   Request today submitted for preop clearance for upcoming EGD/colonoscopy.  Recently seen in the clinic yesterday by Dr. Rudy and is doing well without any chest pain or any new cardiovascular symptoms.  Since patient is stable, no further testing is needed prior to procedures.  Aspirin  should be continued through the perioperative period.   Will route this bundled recommendation to requesting provider via Epic fax function and remove from pre-op pool. Please call with questions.  [name], PA-C 05/07/2024, 4:02 PM

## 2024-05-07 NOTE — Telephone Encounter (Signed)
 0/23/25   [name] [birth year]   What type of surgery is being performed? EGD and colonoscopy   When is surgery scheduled? TBD   What type of clearance is required (medical or pharmacy to hold medication or both? medical   Name of physician performing surgery?  Dr. Eartha Rouse [facility] at [facility] Phone: [phone] Fax: [phone]   Anethesia type (none, local, MAC, general)? MAC

## 2024-05-07 NOTE — Telephone Encounter (Signed)
 Please schedule in any room Thanks

## 2024-05-07 NOTE — Telephone Encounter (Signed)
 Clearance received, Dr. Castaneda please advise on how to schedule.

## 2024-05-08 MED ORDER — PEG 3350-KCL-NA BICARB-NACL 420 G PO SOLR: 4000.0000 mL | Freq: Once | ORAL | 0 refills | Status: AC

## 2024-05-08 NOTE — Telephone Encounter (Signed)
 Spoke with patient and scheduled TCS/EGD for 05/20/2024 at 2pm. Rx sent to pharmacy. Instructions mailed.

## 2024-05-08 NOTE — Telephone Encounter (Signed)
 PA on [facility] for TCS/EGD: This member's plan does not currently require notification or prior-authorization through the [employer] Notification or Prior-Authorization Program.

## 2024-05-08 NOTE — Addendum Note (Signed)
 Addended by: DALLIE LIONEL RAMAN on: 05/08/2024 08:12 AM   Modules accepted: Orders

## 2024-05-20 ENCOUNTER — Ambulatory Visit (HOSPITAL_COMMUNITY): Admitting: Anesthesiology

## 2024-05-20 ENCOUNTER — Other Ambulatory Visit: Payer: Self-pay

## 2024-05-20 ENCOUNTER — Encounter (HOSPITAL_COMMUNITY): Admission: RE | Disposition: A | Payer: Self-pay | Source: Home / Self Care | Attending: Gastroenterology

## 2024-05-20 ENCOUNTER — Ambulatory Visit (HOSPITAL_COMMUNITY)
Admission: RE | Admit: 2024-05-20 | Discharge: 2024-05-20 | Disposition: A | Attending: Gastroenterology | Admitting: Gastroenterology

## 2024-05-20 ENCOUNTER — Encounter (HOSPITAL_COMMUNITY): Payer: Self-pay | Admitting: Gastroenterology

## 2024-05-20 DIAGNOSIS — D122 Benign neoplasm of ascending colon: Secondary | ICD-10-CM

## 2024-05-20 DIAGNOSIS — D125 Benign neoplasm of sigmoid colon: Secondary | ICD-10-CM

## 2024-05-20 DIAGNOSIS — K648 Other hemorrhoids: Secondary | ICD-10-CM

## 2024-05-20 DIAGNOSIS — K635 Polyp of colon: Secondary | ICD-10-CM | POA: Diagnosis not present

## 2024-05-20 DIAGNOSIS — D124 Benign neoplasm of descending colon: Secondary | ICD-10-CM | POA: Diagnosis not present

## 2024-05-20 DIAGNOSIS — I25119 Atherosclerotic heart disease of native coronary artery with unspecified angina pectoris: Secondary | ICD-10-CM

## 2024-05-20 DIAGNOSIS — Z1211 Encounter for screening for malignant neoplasm of colon: Secondary | ICD-10-CM

## 2024-05-20 DIAGNOSIS — Z87891 Personal history of nicotine dependence: Secondary | ICD-10-CM | POA: Diagnosis not present

## 2024-05-20 DIAGNOSIS — D123 Benign neoplasm of transverse colon: Secondary | ICD-10-CM | POA: Diagnosis not present

## 2024-05-20 HISTORY — PX: COLONOSCOPY: SHX5424

## 2024-05-20 HISTORY — PX: POLYPECTOMY: SHX149

## 2024-05-20 LAB — GLUCOSE, CAPILLARY: Glucose-Capillary: 94 mg/dL (ref 70–99)

## 2024-05-20 LAB — HM COLONOSCOPY

## 2024-05-20 SURGERY — COLONOSCOPY
Anesthesia: General

## 2024-05-20 MED ORDER — PROPOFOL 500 MG/50ML IV EMUL
INTRAVENOUS | Status: DC | PRN
  Administered 2024-05-20: 100 ug/kg/min via INTRAVENOUS
  Administered 2024-05-20: 100 mg via INTRAVENOUS
  Administered 2024-05-20: 20 mg via INTRAVENOUS

## 2024-05-20 MED ORDER — LACTATED RINGERS IV SOLN: INTRAVENOUS | Status: DC

## 2024-05-20 NOTE — Anesthesia Preprocedure Evaluation (Signed)
 Anesthesia Evaluation  Patient identified by MRN, date of birth, ID band Patient awake    Reviewed: Allergy & Precautions, H&P , NPO status , Patient's Chart, lab work & pertinent test results  Airway Mallampati: II  TM Distance: >3 FB Neck ROM: Full    Dental  (+) Edentulous Upper, Edentulous Lower   Pulmonary sleep apnea , pneumonia, former smoker   Pulmonary exam normal breath sounds clear to auscultation       Cardiovascular hypertension, + angina  + CAD, + Past MI and + Peripheral Vascular Disease  Normal cardiovascular exam Rhythm:Regular Rate:Normal  EF 40-45%   Neuro/Psych  Neuromuscular disease  negative psych ROS   GI/Hepatic Neg liver ROS, hiatal hernia,GERD  ,,  Endo/Other  diabetes    Renal/GU negative Renal ROS  negative genitourinary   Musculoskeletal negative musculoskeletal ROS (+)    Abdominal   Peds negative pediatric ROS (+)  Hematology negative hematology ROS (+)   Anesthesia Other Findings   Reproductive/Obstetrics negative OB ROS                              Anesthesia Physical Anesthesia Plan  ASA: 3  Anesthesia Plan: General   Post-op Pain Management:    Induction: Intravenous  PONV Risk Score and Plan:   Airway Management Planned: Nasal Cannula  Additional Equipment:   Intra-op Plan:   Post-operative Plan:   Informed Consent: I have reviewed the patients History and Physical, chart, labs and discussed the procedure including the risks, benefits and alternatives for the proposed anesthesia with the patient or authorized representative who has indicated his/her understanding and acceptance.     Dental advisory given  Plan Discussed with: CRNA  Anesthesia Plan Comments:         Anesthesia Quick Evaluation

## 2024-05-20 NOTE — Op Note (Signed)
 [facility] [name] Procedure Date: 05/20/2024 1:47 PM MRN: [id number] Date of Birth: 03/25/1946 Attending MD: Toribio Fortune , , [id number] CSN: [id number] Age: 78 Admit Type: Outpatient Procedure:                Colonoscopy Indications:              Screening for colorectal malignant neoplasm Providers:                Toribio Fortune, Jon LABOR. Gerome RN, RN, Chad                            Wilson, Technician Referring MD:              Medicines:                Monitored Anesthesia Care Complications:            No immediate complications. Estimated Blood Loss:     Estimated blood loss: none. Procedure:                Pre-Anesthesia Assessment:                           - Prior to the procedure, a History and Physical                            was performed, and patient medications, allergies                            and sensitivities were reviewed. The patient's                            tolerance of previous anesthesia was reviewed.                           - The risks and benefits of the procedure and the                            sedation options and risks were discussed with the                            patient. All questions were answered and informed                            consent was obtained.                           - ASA Grade Assessment: III - A patient with severe                            systemic disease.                           After obtaining informed consent, the colonoscope                            was passed under direct vision. Throughout the  procedure, the patient's blood pressure, pulse, and                            oxygen saturations were monitored continuously. The                            [id number]-[id number] ([id number]) Peds Colon was introduced                            through the anus and advanced to the the cecum,                            identified by appendiceal orifice and  ileocecal                            valve. The colonoscopy was performed without                            difficulty. The patient tolerated the procedure                            well. The quality of the bowel preparation was good. Scope In: 2:02:20 PM Scope Out: 2:27:44 PM Scope Withdrawal Time: 0 hours 11 minutes 23 seconds  Total Procedure Duration: 0 hours 25 minutes 24 seconds  Findings:      The perianal and digital rectal examinations were normal.      Eleven sessile polyps were found in the transverse colon and ascending       colon. The polyps were 2 to 10 mm in size. These polyps were removed       with a cold snare. Resection and retrieval were complete.      Nine sessile polyps were found in the sigmoid colon and descending       colon. The polyps were 3 to 12 mm in size. These polyps were removed       with a cold snare. Resection and retrieval were complete.      Non-bleeding internal hemorrhoids were found during retroflexion. The       hemorrhoids were small. Impression:               - Eleven 2 to 10 mm polyps in the transverse colon                            and in the ascending colon, removed with a cold                            snare. Resected and retrieved.                           - Nine 3 to 12 mm polyps in the sigmoid colon and                            in the descending colon, removed with a cold snare.  Resected and retrieved.                           - Non-bleeding internal hemorrhoids. Moderate Sedation:      Per Anesthesia Care Recommendation:           - Discharge patient to home (ambulatory).                           - Resume previous diet.                           - Await pathology results.                           - Repeat colonoscopy in 3 years for surveillance of                            multiple polyps. Procedure Code(s):        --- Professional ---                           [phone], Colonoscopy, flexible;  with removal of                            tumor(s), polyp(s), or other lesion(s) by snare                            technique Diagnosis Code(s):        --- Professional ---                           Z12.11, Encounter for screening for malignant                            neoplasm of colon                           D12.3, Benign neoplasm of transverse colon (hepatic                            flexure or splenic flexure)                           D12.2, Benign neoplasm of ascending colon                           D12.5, Benign neoplasm of sigmoid colon                           D12.4, Benign neoplasm of descending colon                           K64.8, Other hemorrhoids CPT copyright 2022 American [employer] Association. All rights reserved. The codes documented in this report are preliminary and upon coder review may  be revised to meet current compliance requirements. Toribio Fortune, MD Toribio Fortune,  05/20/2024 2:32:54 PM This report has been signed electronically. Number  of Addenda: 0

## 2024-05-20 NOTE — Transfer of Care (Signed)
 Immediate Anesthesia Transfer of Care Note  Patient: [name] [name]  Procedure(s) Performed: COLONOSCOPY POLYPECTOMY, INTESTINE  Patient Location: Endoscopy Unit  Anesthesia Type:General  Level of Consciousness: awake and patient cooperative  Airway & Oxygen Therapy: Patient Spontanous Breathing  Post-op Assessment: Report given to RN and Post -op Vital signs reviewed and stable  Post vital signs: Reviewed and stable  Last Vitals:  Vitals Value Taken Time  BP 96/44 05/20/24 14:36  Temp 36.8 C 05/20/24 14:36  Pulse 78 05/20/24 14:36  Resp 14 05/20/24 14:36  SpO2 95 % 05/20/24 14:36    Last Pain:  Vitals:   05/20/24 1436  TempSrc: Oral  PainSc: 0-No pain      Patients Stated Pain Goal: 7 (05/20/24 1251)  Complications: No notable events documented.

## 2024-05-20 NOTE — Anesthesia Procedure Notes (Signed)
 Date/Time: 05/20/2024 1:56 PM  Performed by: Barbarann Verneita RAMAN, CRNAPre-anesthesia Checklist: Patient identified, Emergency Drugs available, Suction available, Timeout performed and Patient being monitored Patient Re-evaluated:Patient Re-evaluated prior to induction Oxygen Delivery Method: Nasal cannula Comments: Optiflow

## 2024-05-20 NOTE — H&P (Signed)
 [name] [name] is an 78 y.o. male.   Chief Complaint: CRC screening HPI: 78 y/o M with PMH coronary artery disease and NSTEMI status post stent placement, COPD, pneumonia, diabetes, heart failure with diastolic dysfunction, hyperlipidemia, hypertension, sleep apnea, peripheral vascular disease, who comes for colorectal cancer screening.  Last colonoscopy performed in the 90s, no report available. The patient denies having any nausea, vomiting, fever, chills, hematochezia, melena, hematemesis, abdominal distention, abdominal pain, diarrhea, jaundice, pruritus or weight loss.  No family history of colon cancer.  Notably, the patient endorsed having some dysphagia in the past but this has resolved after following recommendations by speech and swallow.  He would like to hold off on performing EGD.  Past Medical History:  Diagnosis Date   CAP (community acquired pneumonia) 10/11/2017   Coronary artery disease    a. Cath: 2000 nonobs dz. b. 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 s/p BMS to LAD & RCA ; s/p PCI/DES dRCA & PDA x2 2011. d. cath 02/17/14: LM patent, LAD calcified, diffuse irregs, no high grade stenosis mLAD stent patent, LCx, diffuse irregs no high-grade dz, RCA total occ w/ L to R collats, medical Rx rec   Diabetes mellitus    Diastolic dysfunction    a. echo 02/18/14: EF 45-50%, mild LVH, AK of inferior and inferoseptal myocardium, GR1DD   Hiatal hernia    Hyperlipidemia    Hypertension    LV dysfunction    NSTEMI (non-ST elevated myocardial infarction) (HCC) 10/07/2017   PVD (peripheral vascular disease)    Renal artery stenosis    Mild, bilateral   S/P angioplasty with stent, atherectomy DES to pLAD and DES to mLAD 10/10/17  10/11/2017   Sleep apnea    Mild    Past Surgical History:  Procedure Laterality Date   ABDOMINAL AORTOGRAM W/LOWER EXTREMITY N/A 07/21/2020   Procedure: ABDOMINAL AORTOGRAM W/LOWER EXTREMITY;  Surgeon: Gretta Lonni PARAS, MD;  Location: [facility] INVASIVE CV LAB;  Service: Cardiovascular;  Laterality: N/A;   ABDOMINAL AORTOGRAM W/LOWER EXTREMITY Left 03/14/2023   Procedure: ABDOMINAL AORTOGRAM W/LOWER EXTREMITY;  Surgeon: Lanis Fonda BRAVO, MD;  Location[facility] INVASIVE CV LAB;  Service: Cardiovascular;  Laterality: Left;   CATARACT EXTRACTION     CORONARY ATHERECTOMY N/A 10/10/2017   Procedure: CORONARY ATHERECTOMY - CSI;  Surgeon: Mady Lonni, MD;  Location: [facility] INVASIVE CV LAB;  Service: Cardiovascular;  Laterality: N/A;   CORONARY STENT INTERVENTION N/A 10/10/2017   Procedure: CORONARY STENT INTERVENTION;  Surgeon: Mady Lonni, MD;  Location: [facility] INVASIVE CV LAB;  Service: Cardiovascular;  Laterality: N/A;   CORONARY STENT INTERVENTION N/A 07/21/2018   Procedure: CORONARY STENT INTERVENTION;  Surgeon: Mady Lonni, MD;  Location: [facility] INVASIVE CV [facility];  Service: Cardiovascular;  Laterality: N/A;   CORONARY ULTRASOUND/IVUS N/A 10/09/2017   Procedure: Intravascular Ultrasound/IVUS;  Surgeon: Anner Alm ORN, MD;  Location: [facility] INVASIVE CV LAB;  Service: Cardiovascular;  Laterality: N/A;   CORONARY ULTRASOUND/IVUS N/A 07/21/2018   Procedure: Intravascular Ultrasound/IVUS;  Surgeon: Mady Lonni, MD;  Location: [facility] CV [facility];  Service: Cardiovascular;  Laterality: N/A;   FEMORAL-POPLITEAL BYPASS GRAFT Left 07/27/2020   Procedure: LEFT FEMORAL TO BELOW KNEE POPLITEAL ARTERY BYPASS;  Surgeon: Gretta Lonni PARAS, MD;  Location: [facility] OR;  Service: Vascular;  Laterality: Left;   HIATAL HERNIA REPAIR     LEFT HEART CATH AND CORONARY ANGIOGRAPHY N/A 10/09/2017   Procedure: LEFT HEART CATH AND CORONARY ANGIOGRAPHY;  Surgeon: Anner Alm ORN,  MD;  Location: [facility] CV [facility];  Service: Cardiovascular;  Laterality: N/A;   LEFT HEART CATH AND CORONARY ANGIOGRAPHY N/A 07/21/2018   Procedure: LEFT HEART CATH AND CORONARY ANGIOGRAPHY;  Surgeon: Mady Bruckner, MD;  Location: [facility] INVASIVE CV LAB;  Service: Cardiovascular;   Laterality: N/A;   LEFT HEART CATHETERIZATION WITH CORONARY ANGIOGRAM N/A 02/17/2014   Procedure: LEFT HEART CATHETERIZATION WITH CORONARY ANGIOGRAM;  Surgeon: Ozell JONETTA Fell, MD;  Location: [facility] CATH LAB;  Service: Cardiovascular;  Laterality: N/A;   SHOULDER ARTHROSCOPY     Right   SOFT TISSUE TUMOR RESECTION     Vocal cord tumor, abdominal   SOFT TISSUE TUMOR RESECTION     Benign neck tumor   VEIN HARVEST Left 07/27/2020   Procedure: VEIN HARVEST USING  GREATER SAPHENOUS VEIN;  Surgeon: Gretta Bruckner PARAS, MD;  Location: [facility] OR;  Service: Vascular;  Laterality: Left;   Vocal cord polyp removal      Family History  Problem Relation Age of Onset   Heart attack Father 37   Cancer Mother    Cancer Brother    Social History:  reports that he quit smoking about 20 years ago. His smoking use included cigarettes. He has never been exposed to tobacco smoke. He has never used smokeless tobacco. He reports that he does not drink alcohol and does not use drugs.  Allergies:  Allergies  Allergen Reactions   Bee Venom Swelling and Other (See Comments)   Other Other (See Comments)   Penicillin G Other (See Comments)   Penicillins Itching and Swelling    Itching, swelling of the face/tongue/throat, SOB, or low BP   Plavix  [Clopidogrel  Bisulfate] Other (See Comments)    Patient is noted to be a nonresponder to Plavix     Medications Prior to Admission  Medication Sig Dispense Refill   allopurinol  (ZYLOPRIM ) 100 MG tablet Take 100 mg by mouth every morning.     amLODipine  (NORVASC ) 10 MG tablet Take 1 tablet (10 mg total) by mouth every morning.     aspirin  81 MG tablet Take 81 mg by mouth every morning.     carvedilol  (COREG ) 25 MG tablet TAKE 1 TABLET BY MOUTH TWO TIMES DAILY WITH A MEAL 180 tablet 3   chlorthalidone  (HYGROTON ) 25 MG tablet Take 25 mg by mouth daily.     fenofibrate  (TRICOR ) 145 MG tablet Take 1 tablet (145 mg total) by mouth daily. 90 tablet 3   gabapentin  (NEURONTIN ) 100 MG  capsule Take 200 mg by mouth 3 (three) times daily.     hydrochlorothiazide  (HYDRODIURIL ) 25 MG tablet Take 1 tablet (25 mg total) by mouth daily. 90 tablet 3   isosorbide  mononitrate (IMDUR ) 120 MG 24 hr tablet Take 1 tablet (120 mg total) by mouth at bedtime. 90 tablet 3   losartan  (COZAAR ) 100 MG tablet Take 100 mg by mouth every morning.      metFORMIN  (GLUCOPHAGE -XR) 500 MG 24 hr tablet Take 500 mg by mouth 2 (two) times daily.     omeprazole (PRILOSEC) 20 MG capsule Take 20 mg by mouth every morning.      TOUJEO  MAX SOLOSTAR 300 UNIT/ML Solostar Pen Inject 100 Units into the skin daily.     empagliflozin  (JARDIANCE ) 25 MG TABS tablet Take 25 mg by mouth daily.     nitroGLYCERIN  (NITROSTAT ) 0.4 MG SL tablet Place 1 tablet (0.4 mg total) under the tongue every 5 (five) minutes as needed for chest pain. 25 tablet 3   rosuvastatin  (  CRESTOR ) 40 MG tablet Take 1 tablet (40 mg total) by mouth daily. 90 tablet 3    No results found for this or any previous visit (from the past 48 hours). No results found.  Review of Systems  All other systems reviewed and are negative.   Blood pressure (!) 151/73, pulse 95, temperature 97.7 F (36.5 C), temperature source Oral, resp. rate 19, SpO2 98%. Physical Exam  GENERAL: The patient is AO x3, in no acute distress. HEENT: Head is normocephalic and atraumatic. EOMI are intact. Mouth is well hydrated and without lesions. NECK: Supple. No masses LUNGS: Clear to auscultation. No presence of rhonchi/wheezing/rales. Adequate chest expansion HEART: RRR, normal s1 and s2. ABDOMEN: Soft, nontender, no guarding, no peritoneal signs, and nondistended. BS +. No masses. EXTREMITIES: Without any cyanosis, clubbing, rash, lesions or edema. NEUROLOGIC: AOx3, no focal motor deficit. SKIN: no jaundice, no rashes  Assessment/Plan 78 y/o M with PMH coronary artery disease and NSTEMI status post stent placement, COPD, pneumonia, diabetes, heart failure with diastolic  dysfunction, hyperlipidemia, hypertension, sleep apnea, peripheral vascular disease, who comes for colorectal cancer screening.  Will proceed with colonoscopy.  Toribio Eartha Flavors, MD 05/20/2024, 1:08 PM

## 2024-05-20 NOTE — Discharge Instructions (Signed)
 You are being discharged to home.  Resume your previous diet.  We are waiting for your pathology results.  Your physician has recommended a repeat colonoscopy in three years for surveillance of multiple polyps.

## 2024-05-21 ENCOUNTER — Encounter (INDEPENDENT_AMBULATORY_CARE_PROVIDER_SITE_OTHER): Payer: Self-pay | Admitting: *Deleted

## 2024-05-21 NOTE — Anesthesia Postprocedure Evaluation (Signed)
 Anesthesia Post Note  Patient: [name] [name]  Procedure(s) Performed: COLONOSCOPY POLYPECTOMY, INTESTINE  Patient location during evaluation: PACU Anesthesia Type: General Level of consciousness: awake and alert Pain management: pain level controlled Vital Signs Assessment: post-procedure vital signs reviewed and stable Respiratory status: spontaneous breathing, nonlabored ventilation, respiratory function stable and patient connected to nasal cannula oxygen Cardiovascular status: blood pressure returned to baseline and stable Postop Assessment: no apparent nausea or vomiting Anesthetic complications: no   No notable events documented.   Last Vitals:  Vitals:   05/20/24 1439 05/20/24 1441  BP: (!) 105/50 107/61  Pulse:    Resp:    Temp:    SpO2:      Last Pain:  Vitals:   05/20/24 1436  TempSrc: Oral  PainSc: 0-No pain                 Andrea Limes

## 2024-05-22 ENCOUNTER — Ambulatory Visit (INDEPENDENT_AMBULATORY_CARE_PROVIDER_SITE_OTHER): Payer: Self-pay | Admitting: Gastroenterology

## 2024-05-22 ENCOUNTER — Encounter (HOSPITAL_COMMUNITY): Payer: Self-pay | Admitting: Gastroenterology

## 2024-05-22 LAB — SURGICAL PATHOLOGY

## 2024-05-25 ENCOUNTER — Ambulatory Visit: Attending: Cardiology

## 2024-05-25 ENCOUNTER — Encounter (HOSPITAL_COMMUNITY): Payer: Self-pay | Admitting: Gastroenterology

## 2024-05-25 ENCOUNTER — Ambulatory Visit: Payer: Self-pay | Admitting: Cardiology

## 2024-05-25 DIAGNOSIS — I6529 Occlusion and stenosis of unspecified carotid artery: Secondary | ICD-10-CM

## 2024-05-25 DIAGNOSIS — I6523 Occlusion and stenosis of bilateral carotid arteries: Secondary | ICD-10-CM | POA: Diagnosis not present

## 2024-05-28 NOTE — Progress Notes (Signed)
 3 yr TCS noted in recall Patient result letter mailed procedure note and pathology result faxed to PCP

## 2024-06-23 ENCOUNTER — Ambulatory Visit (HOSPITAL_BASED_OUTPATIENT_CLINIC_OR_DEPARTMENT_OTHER)
Admission: RE | Admit: 2024-06-23 | Discharge: 2024-06-23 | Disposition: A | Source: Ambulatory Visit | Attending: Vascular Surgery

## 2024-06-23 ENCOUNTER — Ambulatory Visit

## 2024-06-23 ENCOUNTER — Ambulatory Visit (HOSPITAL_COMMUNITY)
Admission: RE | Admit: 2024-06-23 | Discharge: 2024-06-23 | Disposition: A | Discharge status: Home / Self Care | Source: Ambulatory Visit | Attending: Vascular Surgery | Admitting: Vascular Surgery

## 2024-06-23 VITALS — BP 137/68 | HR 82 | Ht 70.0 in | Wt 199.2 lb

## 2024-06-23 DIAGNOSIS — T82858A Stenosis of vascular prosthetic devices, implants and grafts, initial encounter: Secondary | ICD-10-CM | POA: Diagnosis not present

## 2024-06-23 DIAGNOSIS — I739 Peripheral vascular disease, unspecified: Secondary | ICD-10-CM | POA: Diagnosis not present

## 2024-06-23 DIAGNOSIS — I70498 Other atherosclerosis of autologous vein bypass graft(s) of the extremities, other extremity: Secondary | ICD-10-CM

## 2024-06-23 LAB — VAS US ABI WITH/WO TBI: Right ABI: 0.58

## 2024-06-23 NOTE — Progress Notes (Signed)
 " Office Note     CC:  follow up Requesting Provider:  Renato Dorothey HERO, NP  HPI: [name] [name] is a 79 y.o. ([birth year]) male who presents for surveillance of PAD with history of left common femoral artery to below the knee popliteal bypass with vein on 07/27/2020 by Dr. Gretta due to rest pain of the left foot.  He underwent diagnostic angiogram of the left leg bypass in September 2024 due to suspected distal anastomosis stenosis however this was negative.  He denies any rest pain in his left foot.  He continues to complain of swelling since his surgery however refuses to wear compression socks.  He elevates his leg periodically during the day which seems to help.  He currently complains of right knee pain and plans to ask for a referral to orthopedics from his PCP during office visit tomorrow.  He is on aspirin  and statin daily.  He is a former smoker.   Past Medical History:  Diagnosis Date   CAP (community acquired pneumonia) 10/11/2017   Coronary artery disease    a. Cath: 2000 nonobs dz. b. 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 s/p BMS to LAD & RCA ; s/p PCI/DES dRCA & PDA x2 2011. d. cath 02/17/14: LM patent, LAD calcified, diffuse irregs, no high grade stenosis mLAD stent patent, LCx, diffuse irregs no high-grade dz, RCA total occ w/ L to R collats, medical Rx rec   Diabetes mellitus    Diastolic dysfunction    a. echo 02/18/14: EF 45-50%, mild LVH, AK of inferior and inferoseptal myocardium, GR1DD   Hiatal hernia    Hyperlipidemia    Hypertension    LV dysfunction    NSTEMI (non-ST elevated myocardial infarction) (HCC) 10/07/2017   PVD (peripheral vascular disease)    Renal artery stenosis    Mild, bilateral   S/P angioplasty with stent, atherectomy DES to pLAD and DES to mLAD 10/10/17  10/11/2017   Sleep apnea    Mild    Past Surgical History:  Procedure Laterality Date   ABDOMINAL AORTOGRAM W/LOWER EXTREMITY N/A 07/21/2020   Procedure:  ABDOMINAL AORTOGRAM W/LOWER EXTREMITY;  Surgeon: Gretta Lonni PARAS, MD;  Location: [facility] INVASIVE CV LAB;  Service: Cardiovascular;  Laterality: N/A;   ABDOMINAL AORTOGRAM W/LOWER EXTREMITY Left 03/14/2023   Procedure: ABDOMINAL AORTOGRAM W/LOWER EXTREMITY;  Surgeon: Lanis Fonda BRAVO, MD;  Location[facility] INVASIVE CV LAB;  Service: Cardiovascular;  Laterality: Left;   CATARACT EXTRACTION     COLONOSCOPY N/A 05/20/2024   Procedure: COLONOSCOPY;  Surgeon: Eartha Angelia Sieving, MD;  Location: [facility];  Service: Gastroenterology;  Laterality: N/A;  2:00pm, ASA 1-2   CORONARY ATHERECTOMY N/A 10/10/2017   Procedure: CORONARY ATHERECTOMY - CSI;  Surgeon: Mady Lonni, MD;  Location: [facility] INVASIVE CV LAB;  Service: Cardiovascular;  Laterality: N/A;   CORONARY STENT INTERVENTION N/A 10/10/2017   Procedure: CORONARY STENT INTERVENTION;  Surgeon: Mady Lonni, MD;  Location: [facility];  Service: Cardiovascular;  Laterality: N/A;   CORONARY STENT INTERVENTION N/A 07/21/2018   Procedure: CORONARY STENT INTERVENTION;  Surgeon: Mady Lonni, MD;  Location: [facility] INVASIVE CV LAB;  Service: Cardiovascular;  Laterality: N/A;   CORONARY ULTRASOUND/IVUS N/A 10/09/2017   Procedure: Intravascular Ultrasound/IVUS;  Surgeon: Anner Alm ORN, MD;  Location: [facility] INVASIVE CV LAB;  Service: Cardiovascular;  Laterality: N/A;   CORONARY ULTRASOUND/IVUS N/A 07/21/2018   Procedure: Intravascular Ultrasound/IVUS;  Surgeon: Mady Lonni, MD;  Location: [facility] INVASIVE  CV LAB;  Service: Cardiovascular;  Laterality: N/A;   FEMORAL-POPLITEAL BYPASS GRAFT Left 07/27/2020   Procedure: LEFT FEMORAL TO BELOW KNEE POPLITEAL ARTERY BYPASS;  Surgeon: Gretta Lonni PARAS, MD;  Location: [facility] OR;  Service: Vascular;  Laterality: Left;   HIATAL HERNIA REPAIR     LEFT HEART CATH AND CORONARY ANGIOGRAPHY N/A 10/09/2017   Procedure: LEFT HEART CATH AND CORONARY ANGIOGRAPHY;  Surgeon: Anner Alm ORN, MD;  Location: [facility] INVASIVE CV  LAB;  Service: Cardiovascular;  Laterality: N/A;   LEFT HEART CATH AND CORONARY ANGIOGRAPHY N/A 07/21/2018   Procedure: LEFT HEART CATH AND CORONARY ANGIOGRAPHY;  Surgeon: Mady Lonni, MD;  Location: [facility] INVASIVE CV LAB;  Service: Cardiovascular;  Laterality: N/A;   LEFT HEART CATHETERIZATION WITH CORONARY ANGIOGRAM N/A 02/17/2014   Procedure: LEFT HEART CATHETERIZATION WITH CORONARY ANGIOGRAM;  Surgeon: Ozell JONETTA Fell, MD;  Location: [facility] CATH LAB;  Service: Cardiovascular;  Laterality: N/A;   POLYPECTOMY  05/20/2024   Procedure: POLYPECTOMY, INTESTINE;  Surgeon: Eartha Flavors, Toribio, MD;  Location: [facility];  Service: Gastroenterology;;   SHOULDER ARTHROSCOPY     Right   SOFT TISSUE TUMOR RESECTION     Vocal cord tumor, abdominal   SOFT TISSUE TUMOR RESECTION     Benign neck tumor   VEIN HARVEST Left 07/27/2020   Procedure: VEIN HARVEST USING  GREATER SAPHENOUS VEIN;  Surgeon: Gretta Lonni PARAS, MD;  Location: [facility] OR;  Service: Vascular;  Laterality: Left;   Vocal cord polyp removal      Social History   Socioeconomic History   Marital status: Married    Spouse name: Not on file   Number of children: 3   Years of education: Not on file   Highest education level: Not on file  Occupational History   Occupation: PACKAGING    Employer: RETIRED    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   Social Drivers of Health   Tobacco Use: Medium Risk (06/23/2024)   Patient History    Smoking Tobacco Use: Former    Smokeless Tobacco Use:  Never    Passive Exposure: Never  Programmer, Applications: Not on file  Food Insecurity: No Food Insecurity (05/22/2023)   Hunger Vital Sign    Worried About Running Out of Food in the Last Year: Never true    Ran Out of Food in the Last Year: Never true  Transportation Needs: No Transportation Needs (05/22/2023)   PRAPARE - Administrator, Civil Service (Medical): No    Lack of Transportation (Non-Medical): No  Physical Activity: Not on file  Stress: Not on file  Social Connections: Unknown (10/30/2021)   [facility]   Social Network    Social Network: Not on file  Intimate Partner Violence: Not At Risk (05/22/2023)   Humiliation, Afraid, Rape, and Kick questionnaire    Fear of Current or Ex-Partner: No    Emotionally  Abused: No    Physically Abused: No    Sexually Abused: No  Depression (PHQ2-9): Not on file  Alcohol Screen: Not on file  Housing: Low Risk (05/22/2023)   Housing    Last Housing Risk Score: 0  Utilities: Not At Risk (05/22/2023)   AHC Utilities    Threatened with loss of utilities: No  Health Literacy: Not on file    Family History  Problem Relation Age of Onset   Heart attack Father 50   Cancer Mother    Cancer Brother     Current Outpatient Medications  Medication Sig Dispense Refill   allopurinol  (ZYLOPRIM ) 100 MG tablet Take 100 mg by mouth every morning.     amLODipine  (NORVASC ) 10 MG tablet Take 1 tablet (10 mg total) by mouth every morning.     aspirin  81 MG tablet Take 81 mg by mouth every morning.     carvedilol  (COREG ) 25 MG tablet TAKE 1 TABLET BY MOUTH TWO TIMES DAILY WITH A MEAL 180 tablet 3   chlorthalidone  (HYGROTON ) 25 MG tablet Take 25 mg by mouth daily.     empagliflozin  (JARDIANCE ) 25 MG TABS tablet Take 25 mg by mouth daily.     fenofibrate  (TRICOR ) 145 MG tablet Take 1 tablet (145 mg total) by mouth daily. 90 tablet 3   gabapentin  (NEURONTIN ) 100 MG capsule Take 200 mg by mouth 3 (three) times daily.      hydrochlorothiazide  (HYDRODIURIL ) 25 MG tablet Take 1 tablet (25 mg total) by mouth daily. 90 tablet 3   isosorbide  mononitrate (IMDUR ) 120 MG 24 hr tablet Take 1 tablet (120 mg total) by mouth at bedtime. 90 tablet 3   losartan  (COZAAR ) 100 MG tablet Take 100 mg by mouth every morning.      metFORMIN  (GLUCOPHAGE -XR) 500 MG 24 hr tablet Take 500 mg by mouth 2 (two) times daily.     nitroGLYCERIN  (NITROSTAT ) 0.4 MG SL tablet Place 1 tablet (0.4 mg total) under the tongue every 5 (five) minutes as needed for chest pain. 25 tablet 3   omeprazole (PRILOSEC) 20 MG capsule Take 20 mg by mouth every morning.      rosuvastatin  (CRESTOR ) 40 MG tablet Take 1 tablet (40 mg total) by mouth daily. 90 tablet 3   TOUJEO  MAX SOLOSTAR 300 UNIT/ML Solostar Pen Inject 100 Units into the skin daily.     No current facility-administered medications for this visit.    Allergies[1]   REVIEW OF SYSTEMS:  Negative unless noted in HPI [X]  denotes positive finding, [ ]  denotes negative finding Cardiac  Comments:  Chest pain or chest pressure:    Shortness of breath upon exertion:    Short of breath when lying flat:    Irregular heart rhythm:        Vascular    Pain in calf, thigh, or hip brought on by ambulation:    Pain in feet at night that wakes you up from your sleep:     Blood clot in your veins:    Leg swelling:         Pulmonary    Oxygen at home:    Productive cough:     Wheezing:         Neurologic    Sudden weakness in arms or legs:     Sudden numbness in arms or legs:     Sudden onset of difficulty speaking or slurred speech:    Temporary loss of vision in one eye:  Problems with dizziness:         Gastrointestinal    Blood in stool:     Vomited blood:         Genitourinary    Burning when urinating:     Blood in urine:        Psychiatric    Major depression:         Hematologic    Bleeding problems:    Problems with blood clotting too easily:        Skin    Rashes or  ulcers:        Constitutional    Fever or chills:      PHYSICAL EXAMINATION:  Vitals:   06/23/24 0901  BP: 137/68  Pulse: 82  TempSrc: Temporal  Weight: 199 lb 3.2 oz (90.4 kg)  Height: 5' 10 (1.778 m)    General:  WDWN in NAD; vital signs documented above Gait: Not observed HENT: WNL, normocephalic Pulmonary: normal non-labored breathing Cardiac: regular HR Abdomen: soft, NT, no masses Skin: without rashes Vascular Exam/Pulses: Brisk left DP and PT by Doppler; edematous left leg to the level of the mid shin Extremities: without ischemic changes, without Gangrene , without cellulitis; without open wounds;  Musculoskeletal: no muscle wasting or atrophy  Neurologic: A&O X 3 Psychiatric:  The pt has Normal affect.   Non-Invasive Vascular Imaging:    Unchanged duplex findings with a patent bypass Mildly elevated velocity at the bypass outflow    ASSESSMENT/PLAN:: 79 y.o. male here for follow up for surveillance of PAD with left leg bypass  Subjectively, he continues to be without rest pain in the left foot since bypass surgery.  Duplex demonstrates a patent bypass with stable mild velocity elevation at the outflow.  He continues to complain of edema in his left leg since surgery however is unwilling to wear compression.  I encouraged proper leg elevation during the day to help manage swelling.  He will continue his aspirin  and statin daily.  I also encouraged him to walk for exercise.  Will repeat left lower extremity bypass duplex and ABI in 6 months.  He will notify the office in the meantime with any questions or concerns.   Donnice Sender, PA-C [facility] [phone]  Clinic MD:   [place]     [1]  Allergies Allergen Reactions   Bee Venom Swelling and Other (See Comments)   Other Other (See Comments)   Penicillin G Other (See Comments)   Penicillins Itching and Swelling    Itching, swelling of the face/tongue/throat, SOB, or low BP    Plavix  [Clopidogrel  Bisulfate] Other (See Comments)    Patient is noted to be a nonresponder to Plavix    "

## 2024-06-25 ENCOUNTER — Other Ambulatory Visit: Payer: Self-pay | Admitting: *Deleted

## 2024-06-25 DIAGNOSIS — I70498 Other atherosclerosis of autologous vein bypass graft(s) of the extremities, other extremity: Secondary | ICD-10-CM

## 2024-06-25 DIAGNOSIS — I739 Peripheral vascular disease, unspecified: Secondary | ICD-10-CM

## 2024-12-22 ENCOUNTER — Ambulatory Visit (HOSPITAL_COMMUNITY)

## 2024-12-22 ENCOUNTER — Ambulatory Visit
